# Patient Record
Sex: Female | Born: 1940 | Race: White | Hispanic: No | Smoking: Never smoker
Health system: Southern US, Community
[De-identification: ages and names within clinical notes are randomized; demographics above are authoritative.]

## PROBLEM LIST (undated history)

## (undated) DIAGNOSIS — E785 Hyperlipidemia, unspecified: Secondary | ICD-10-CM

## (undated) DIAGNOSIS — E7800 Pure hypercholesterolemia, unspecified: Secondary | ICD-10-CM

## (undated) DIAGNOSIS — M810 Age-related osteoporosis without current pathological fracture: Secondary | ICD-10-CM

## (undated) DIAGNOSIS — D649 Anemia, unspecified: Secondary | ICD-10-CM

## (undated) DIAGNOSIS — R809 Proteinuria, unspecified: Secondary | ICD-10-CM

## (undated) DIAGNOSIS — M1990 Unspecified osteoarthritis, unspecified site: Secondary | ICD-10-CM

## (undated) DIAGNOSIS — I10 Essential (primary) hypertension: Secondary | ICD-10-CM

## (undated) DIAGNOSIS — N200 Calculus of kidney: Secondary | ICD-10-CM

## (undated) DIAGNOSIS — K219 Gastro-esophageal reflux disease without esophagitis: Secondary | ICD-10-CM

## (undated) HISTORY — DX: Age-related osteoporosis without current pathological fracture: M81.0

## (undated) HISTORY — PX: EYE SURGERY: SHX253

## (undated) HISTORY — DX: Anemia, unspecified: D64.9

## (undated) HISTORY — PX: BACK SURGERY: SHX140

## (undated) HISTORY — PX: NASAL SEPTUM SURGERY: SHX37

## (undated) HISTORY — DX: Gastro-esophageal reflux disease without esophagitis: K21.9

## (undated) HISTORY — PX: CHOLECYSTECTOMY: SHX55

## (undated) HISTORY — DX: Calculus of kidney: N20.0

## (undated) HISTORY — DX: Unspecified osteoarthritis, unspecified site: M19.90

## (undated) HISTORY — PX: BREAST CYST ASPIRATION: SHX578

## (undated) HISTORY — DX: Hyperlipidemia, unspecified: E78.5

## (undated) HISTORY — PX: ABDOMINAL HYSTERECTOMY: SHX81

---

## 1999-03-01 ENCOUNTER — Other Ambulatory Visit: Admission: RE | Admit: 1999-03-01 | Discharge: 1999-03-01 | Payer: Self-pay | Admitting: *Deleted

## 2000-07-02 ENCOUNTER — Other Ambulatory Visit: Admission: RE | Admit: 2000-07-02 | Discharge: 2000-07-02 | Payer: Self-pay | Admitting: *Deleted

## 2004-04-25 ENCOUNTER — Emergency Department (HOSPITAL_COMMUNITY): Admission: EM | Admit: 2004-04-25 | Discharge: 2004-04-25 | Payer: Self-pay | Admitting: Emergency Medicine

## 2004-09-26 ENCOUNTER — Emergency Department (HOSPITAL_COMMUNITY): Admission: EM | Admit: 2004-09-26 | Discharge: 2004-09-26 | Payer: Self-pay | Admitting: Emergency Medicine

## 2004-11-30 ENCOUNTER — Ambulatory Visit (HOSPITAL_COMMUNITY): Admission: RE | Admit: 2004-11-30 | Discharge: 2004-11-30 | Payer: Self-pay | Admitting: Family Medicine

## 2005-02-15 ENCOUNTER — Ambulatory Visit (HOSPITAL_COMMUNITY): Admission: RE | Admit: 2005-02-15 | Discharge: 2005-02-15 | Payer: Self-pay | Admitting: Internal Medicine

## 2005-02-15 ENCOUNTER — Ambulatory Visit: Payer: Self-pay | Admitting: Internal Medicine

## 2006-05-21 ENCOUNTER — Ambulatory Visit: Payer: Self-pay | Admitting: Internal Medicine

## 2006-05-24 ENCOUNTER — Encounter: Admission: RE | Admit: 2006-05-24 | Discharge: 2006-05-24 | Payer: Self-pay | Admitting: Obstetrics and Gynecology

## 2006-06-04 ENCOUNTER — Ambulatory Visit: Payer: Self-pay | Admitting: Internal Medicine

## 2006-10-30 ENCOUNTER — Ambulatory Visit: Payer: Self-pay | Admitting: Internal Medicine

## 2006-10-30 LAB — CONVERTED CEMR LAB
CO2: 30 meq/L (ref 19–32)
Calcium: 9.2 mg/dL (ref 8.4–10.5)
Chloride: 107 meq/L (ref 96–112)
Glucose, Bld: 125 mg/dL — ABNORMAL HIGH (ref 70–99)
Hgb A1c MFr Bld: 8.6 % — ABNORMAL HIGH (ref 4.6–6.0)
Microalb Creat Ratio: 565.5 mg/g — ABNORMAL HIGH (ref 0.0–30.0)

## 2006-12-31 ENCOUNTER — Ambulatory Visit: Payer: Self-pay | Admitting: Internal Medicine

## 2007-01-17 DIAGNOSIS — I10 Essential (primary) hypertension: Secondary | ICD-10-CM | POA: Insufficient documentation

## 2007-01-17 DIAGNOSIS — M949 Disorder of cartilage, unspecified: Secondary | ICD-10-CM

## 2007-01-17 DIAGNOSIS — Z9079 Acquired absence of other genital organ(s): Secondary | ICD-10-CM | POA: Insufficient documentation

## 2007-01-17 DIAGNOSIS — M899 Disorder of bone, unspecified: Secondary | ICD-10-CM | POA: Insufficient documentation

## 2007-01-17 DIAGNOSIS — E785 Hyperlipidemia, unspecified: Secondary | ICD-10-CM | POA: Insufficient documentation

## 2007-01-17 DIAGNOSIS — E119 Type 2 diabetes mellitus without complications: Secondary | ICD-10-CM | POA: Insufficient documentation

## 2007-02-27 ENCOUNTER — Encounter: Payer: Self-pay | Admitting: Internal Medicine

## 2007-02-27 ENCOUNTER — Ambulatory Visit: Payer: Self-pay

## 2007-10-03 ENCOUNTER — Encounter: Admission: RE | Admit: 2007-10-03 | Discharge: 2007-10-03 | Payer: Self-pay | Admitting: Family Medicine

## 2007-10-14 ENCOUNTER — Encounter: Admission: RE | Admit: 2007-10-14 | Discharge: 2007-10-14 | Payer: Self-pay | Admitting: Family Medicine

## 2007-11-11 ENCOUNTER — Telehealth (INDEPENDENT_AMBULATORY_CARE_PROVIDER_SITE_OTHER): Payer: Self-pay | Admitting: *Deleted

## 2007-11-18 ENCOUNTER — Ambulatory Visit (HOSPITAL_COMMUNITY): Admission: RE | Admit: 2007-11-18 | Discharge: 2007-11-19 | Payer: Self-pay | Admitting: Neurosurgery

## 2010-02-24 ENCOUNTER — Encounter (INDEPENDENT_AMBULATORY_CARE_PROVIDER_SITE_OTHER): Payer: Self-pay

## 2010-09-25 ENCOUNTER — Encounter: Payer: Self-pay | Admitting: Obstetrics and Gynecology

## 2010-10-06 NOTE — Letter (Signed)
Summary: Recall Colonoscopy/Endoscopy, Change to Office Visit  [facility]  [address]   [place], [place] [address]   Phone: [phone]  Fax: [phone]      February 24, 2010   Catherine Burke [address] [place], [place]  [address] [birth year]   Dear [name],   According to our records, it is time for you to schedule a Colonoscopy/Endoscopy. However, after reviewing your medical record, we recommend an office visit in order to determine your need for a repeat procedure.  Please call [phone] at your convenience to schedule an office visit. If you have any questions or concerns, please feel free to contact our office.   Sincerely,   Waldon Merl LPN  [facility] Ph: [phone]   Fax: [phone]

## 2011-01-17 NOTE — Op Note (Signed)
NAMESHARMONIQUE, MILMAN NO.:  [id number]   MEDICAL RECORD NO.:  [id number]          PATIENT TYPE:  INP   LOCATION:  [place]                         FACILITY:  [facility] [place]   PHYSICIAN:  Ophelia Charter, M.D.DATE OF BIRTH:  1941-06-20   DATE OF PROCEDURE:  DATE OF DISCHARGE:                               OPERATIVE REPORT   BRIEF HISTORY:  The patient is a 70 year old white female who has  suffered from back and left leg pain consistent with a left L3  radiculopathy.  She failed medical management, was worked up with a  lumbar MRI which demonstrated a far lateral herniated disk at L3-4 on  the left.  The patient's signs, symptoms, and physical exam were  consistent with left L3 radiculopathy.  I discussed the various  treatment options with her including surgery.  She has weighed the  risks, benefits, alternatives of the surgery and decided to proceed with  the operation.   Of note on the morning of surgery, the patient's potassium was found to  be 2.6.  She was given multiple runs of potassium prior to surgery.   PREOPERATIVE DIAGNOSES:  Left L3-4 far lateral herniated nucleus  pulposus, spinal stenosis, lumbar radiculopathy/myelopathy, lumbago.   POSTOPERATIVE DIAGNOSES:  Left L3-4 far lateral herniated nucleus  pulposus, spinal stenosis, lumbar radiculopathy/myelopathy, lumbago.   PROCEDURE:  Left L3-4 far lateral microdiskectomy using microdissection.   SURGEON:  Ophelia Charter, M.D.   ASSISTANT:  Leeroy Cha, M.D.   ANESTHESIA:  General endotracheal.   ESTIMATED BLOOD LOSS:  25 cc.   SPECIMENS:  None.   DRAINS:  None.   COMPLICATIONS:  None.   DESCRIPTION OF PROCEDURE:  The patient was brought to the operating by  the anesthesia team.  General endotracheal anesthesia was induced.  The  patient was turned to the prone position on the Wilson frame.  Lumbosacral region was then prepared with Betadine scrub and Betadine  solution.  Sterile drapes  were applied.  I then injected the area to be  incised with Marcaine with epinephrine solution.  I used a scalpel to  make  a linear incision, midline incision over the L3-4 interspace.  I  used electrocautery to perform a left-sided subperiosteal dissection  exposing spinous process lamina of L3-L4.  We obtained intraoperative  radiograph to confirm our location.  We then inserted the [facility]  retractor for exposure.   We then brought the operative microscope into the field and under its  magnification, illumination, we completed the  microdissection/decompression.  We used electrocautery to expose the  left L3 pars region.  Then, we  used a high-speed drill to drill  off  the lateral aspect of left L3 pars region exposing underlying  intertransverse ligament.  We then removed this ligament with the  Kerrison punches and then used microdissection to identify and expose  the underlying exiting left L3 nerve root.  We then dissected around the  nerve root and noted just cephalad and lateral to the nerve root was a  large free fragment disk herniation.  We removed the multiple fragments  using  the micropituitary forceps.  We then palpated along the ventral  surface of the nerve root and at this point, it was well decompressed.  We inspected the intervertebral disk.  We did not see any impending  herniations, and we did not enter into the intervertebral disk space.  We then obtained hemostasis using bipolar electrocautery.  We irrigated  the wound out with bacitracin solution, then removed the retractor.  We  then reapproximated the patient's thoracolumbar fascia with interrupted  #1 Vicryl suture and subcutaneous tissue with interrupted 2-0 Vicryl  suture and the skin with Steri-Strips and Benzoin.  The wound was then  coated bacitracin ointment.  Sterile dressings applied.  The drapes were  removed.  The patient was subsequently returned to supine position where  she was extubated by  the anesthesia team and transported to  postanesthesia care unit in stable condition.  All sponge, instrument,  and needle counts were correct in this case.      Ophelia Charter, M.D.  Electronically Signed     JDJ/MEDQ  D:  11/18/2007  T:  11/19/2007  Job:  [id number]

## 2011-01-20 NOTE — Assessment & Plan Note (Signed)
[facility] OFFICE NOTE   Catherine, Burke                       MRN:          [id number]  DATE:05/21/2006                            DOB:          May 17, 1941    CHIEF COMPLAINT:  Here to establish.   HISTORY OF THE PRESENT ILLNESS:  [name] is a 70 year old white female  who came to the office for the first time to get established.  She has been  taking care of her mother, who recently passed.  Because of that, she had  very little time to take care of herself and consequently is now here for  evaluation of her major medical problems.   PAST MEDICAL HISTORY:  1. Diabetes diagnosed in 2004.  2. Hypertension diagnosed in the 1980s.  3. Osteoarthritis of the knees.  4. Osteopenia.  5. Status post partial hysterectomy, cholecystectomy and surgery for      deviated septum.  6. Bone density test this year per her OB/GYN.  7. Status post 2 colonoscopies; the last one was last year; this was done      at [facility] at Dr. Laural Golden.  The colonoscopy in 2006 was basically      negative and she was told to repeat it around 2012.   FAMILY HISTORY:  1. Father had an MI at age 75.  2. Diabetes runs in her family; several aunts have it.  3. There are people in her family with high blood pressure.  4. Two cousins have colon cancer.  5. An aunt had breast cancer.  6. One of her grandfathers had pancreatic cancer.  17. Mom has a history of blood clots.   SOCIAL HISTORY:  She does not smoke or drink.  She has 3 children.  She is  divorced; since then, her husband has passed.   REVIEW OF SYSTEMS:  GENERAL:  She is feeling okay.  She has not had a blood  check in 6 months.  Her ambulatory blood pressure is variable from 170/90 to  less than that.  Her sugars are consistently around 200 and hardly ever she  sees a number less than that.   MEDICATIONS:  1. Diovan HCT 160/12.5 mg one p.o. daily.  2. Lotrel  10/20 mg one p.o. daily.  3. Toprol-XL 50 mg one p.o. daily.  4. Januvia 100 one p.o. daily, which she started 6 months ago.  5. Fortamet ER 1000 mg one p.o. b.i.d.  6. Evita 60 mg one p.o. daily.  7. Zetia 10 mg one p.o.  8. Aspirin 81 mg once p.o. daily.  9. Vitamin C.  10.Fish oil.   ALLERGIES:  1. PENICILLIN.  2. She cannot tolerate CLONIDINE.   PHYSICAL EXAM:  GENERAL:  The patient is alert, oriented, in no apparent  distress.  VITAL SIGNS:  She is 5-feet 4-inches tall.  She weighs 156 pounds.  Blood  pressure 140/80, pulse 60.  NECK:  No thyromegaly.  LUNGS:  Clear to auscultation bilaterally.  CARDIOVASCULAR:  Regular rate and rhythm without a murmur.  ABDOMEN:  Not distended, soft.  Good  bowel sounds.  No organomegaly.  EXTREMITIES:  No edema.   ASSESSMENT AND PLAN:  1. Diabetes does not seem to be well-controlled at present.  We will check      an A1c and a micro-albumin and further advice will be with results.  2. Hypertension:  This is probably not well-controlled.  Blood pressure      today is only minimally elevated.  We will reassess when she comes      back.  3. High cholesterol:  We will check a fasting lipid profile and monitor      liver function tests.  We will also check a BMP due to the history of      hypertension.  4. We will get old records from the previous doctor.  5. Office visit in 2 weeks.                                   Kathlene November, MD   JP/MedQ  DD:  05/21/2006  DT:  05/22/2006  Job #:  [id number]

## 2011-01-20 NOTE — Op Note (Signed)
Catherine Burke, Catherine Burke                ACCOUNT NO.:  [id number]   MEDICAL RECORD NO.:  [id number]          PATIENT TYPE:  AMB   LOCATION:  DAY                           FACILITY:  [facility]   PHYSICIAN:  Hildred Laser, M.D.    DATE OF BIRTH:  1941-04-09   DATE OF PROCEDURE:  02/15/2005  DATE OF DISCHARGE:                                 OPERATIVE REPORT   PROCEDURE:  Colonoscopy.   ENDOSCOPIST:  Hildred Laser, M.D.   INDICATIONS:  Catherine Burke is a 70 year old Caucasian female who is here for  screening colonoscopy. Family history is positive for colon carcinoma in  second-degree relatives. She had two of her first cousins who died of colon  carcinoma in their 59s. The procedure was reviewed with the patient, and  informed consent was obtained.   PREMEDICATION:  Demerol 50 mg IV, Versed 6 mg IV in divided doses.   FINDINGS:  The procedure was performed in the endoscopy suite. The patient's  vital signs and O2 saturations were monitored during the procedure and  remained stable. The patient was placed in the left lateral position. Rectal  examination was performed. She had soft skin tags. Digital exam was normal.  The Olympus videoscope was placed in the rectum and advanced under vision  into the sigmoid colon and beyond. Redundant sigmoid colon.  Slowly and  carefully scope was advanced without forming the loop. Preparation was  satisfactory. The scope was advanced to the cecum which was identified by  appendiceal orifice and ileocecal valve. Pictures were taken for the record.  As the scope was withdrawn, the colonic mucosa was examined for the second  time and was normal throughout. Rectal mucosa similarly was normal. The  scope was retroflexed to examine the anorectal junction, and small  hemorrhoids were noted below the dentate line. The endoscope was  straightened and withdrawn. The patient tolerated the procedure well.   FINAL DIAGNOSIS:  Redundant but normal colonoscopy, normal colon exam  except  small external hemorrhoids.   RECOMMENDATIONS:  She will resume her usual medications . She should  continue yearly Hemoccults. She may consider next screening exam either in 5  or 10 years, whichever she is comfortable with.       NR/MEDQ  D:  02/15/2005  T:  02/15/2005  Job:  [id number]   cc:   Lorriane Shire, MD

## 2011-05-29 LAB — BASIC METABOLIC PANEL
BUN: 13
BUN: 14
CO2: 27
CO2: 28
Chloride: 102
Chloride: 103
Chloride: 104
Creatinine, Ser: 0.72
Creatinine, Ser: 0.94
GFR calc Af Amer: >60
GFR calc Af Amer: >60
GFR calc non Af Amer: 59 — ABNORMAL LOW
Glucose, Bld: 185 — ABNORMAL HIGH
Potassium: 3.3 — ABNORMAL LOW
Potassium: 2.6 — CL
Potassium: 3.8
Sodium: 144

## 2011-05-29 LAB — CBC
HCT: 38.5
Hemoglobin: 13.1
WBC: 9.5

## 2011-06-01 LAB — HM COLONOSCOPY

## 2011-11-22 ENCOUNTER — Emergency Department (HOSPITAL_COMMUNITY)
Admission: EM | Admit: 2011-11-22 | Discharge: 2011-11-22 | Disposition: A | Discharge status: Home / Self Care | Payer: Medicare Other | Attending: Emergency Medicine | Admitting: Emergency Medicine

## 2011-11-22 ENCOUNTER — Encounter (HOSPITAL_COMMUNITY): Payer: Self-pay

## 2011-11-22 DIAGNOSIS — R1915 Other abnormal bowel sounds: Secondary | ICD-10-CM | POA: Insufficient documentation

## 2011-11-22 DIAGNOSIS — R197 Diarrhea, unspecified: Secondary | ICD-10-CM | POA: Insufficient documentation

## 2011-11-22 DIAGNOSIS — E7800 Pure hypercholesterolemia, unspecified: Secondary | ICD-10-CM | POA: Insufficient documentation

## 2011-11-22 DIAGNOSIS — E119 Type 2 diabetes mellitus without complications: Secondary | ICD-10-CM | POA: Insufficient documentation

## 2011-11-22 DIAGNOSIS — R51 Headache: Secondary | ICD-10-CM | POA: Insufficient documentation

## 2011-11-22 DIAGNOSIS — I10 Essential (primary) hypertension: Secondary | ICD-10-CM | POA: Insufficient documentation

## 2011-11-22 DIAGNOSIS — R109 Unspecified abdominal pain: Secondary | ICD-10-CM | POA: Insufficient documentation

## 2011-11-22 DIAGNOSIS — R112 Nausea with vomiting, unspecified: Secondary | ICD-10-CM | POA: Insufficient documentation

## 2011-11-22 HISTORY — DX: Pure hypercholesterolemia, unspecified: E78.00

## 2011-11-22 HISTORY — DX: Essential (primary) hypertension: I10

## 2011-11-22 LAB — URINALYSIS, ROUTINE W REFLEX MICROSCOPIC
Glucose, UA: >1000 mg/dL — AB
Protein, ur: 30 mg/dL — AB
pH: 6 (ref 5.0–8.0)

## 2011-11-22 LAB — CBC
HCT: 41.6 % (ref 36.0–46.0)
Hemoglobin: 14 g/dL (ref 12.0–15.0)
MCH: 27.4 pg (ref 26.0–34.0)
MCV: 81.4 fL (ref 78.0–100.0)
RBC: 5.11 MIL/uL (ref 3.87–5.11)
WBC: 15 10*3/uL — ABNORMAL HIGH (ref 4.0–10.5)

## 2011-11-22 LAB — BASIC METABOLIC PANEL
CO2: 26 mEq/L (ref 19–32)
Calcium: 9.1 mg/dL (ref 8.4–10.5)
Chloride: 100 mEq/L (ref 96–112)
Glucose, Bld: 379 mg/dL — ABNORMAL HIGH (ref 70–99)
Potassium: 3.4 mEq/L — ABNORMAL LOW (ref 3.5–5.1)
Sodium: 140 mEq/L (ref 135–145)

## 2011-11-22 MED ORDER — SODIUM CHLORIDE 0.9 % IV BOLUS (SEPSIS)
1000.0000 mL | Freq: Once | INTRAVENOUS | Status: AC
  Administered 2011-11-22: 1000 mL via INTRAVENOUS

## 2011-11-22 MED ORDER — SODIUM CHLORIDE 0.9 % IV SOLN
Freq: Once | INTRAVENOUS | Status: AC
  Administered 2011-11-22: 08:00:00 via INTRAVENOUS

## 2011-11-22 MED ORDER — ONDANSETRON HCL 4 MG/2ML IJ SOLN
4.0000 mg | Freq: Once | INTRAMUSCULAR | Status: AC
  Administered 2011-11-22: 4 mg via INTRAVENOUS
  Filled 2011-11-22: qty 2

## 2011-11-22 MED ORDER — MORPHINE SULFATE 2 MG/ML IJ SOLN
2.0000 mg | Freq: Once | INTRAMUSCULAR | Status: AC
  Administered 2011-11-22: 2 mg via INTRAVENOUS
  Filled 2011-11-22: qty 1

## 2011-11-22 MED ORDER — ONDANSETRON HCL 4 MG PO TABS: 4.0000 mg | ORAL_TABLET | Freq: Four times a day (QID) | ORAL | Status: AC

## 2011-11-22 MED ORDER — DIPHENOXYLATE-ATROPINE 2.5-0.025 MG PO TABS
2.0000 | ORAL_TABLET | Freq: Once | ORAL | Status: AC
  Administered 2011-11-22: 2 via ORAL
  Filled 2011-11-22: qty 2

## 2011-11-22 NOTE — Discharge Instructions (Signed)
Drink lots of fluids.  Bland diet for the next 6-8 hours then progress as you can tolerate. Use the nausea medicine as needed. Use BRAT for diarrhea. Follow up with your doctor.   B.R.A.T. Diet Your doctor has recommended the B.R.A.T. diet for you or your child until the condition improves. This is often used to help control diarrhea and vomiting symptoms. If you or your child can tolerate clear liquids, you may have:  Bananas.   Rice.   Applesauce.   Toast (and other simple starches such as crackers, potatoes, noodles).  Be sure to avoid dairy products, meats, and fatty foods until symptoms are better. Fruit juices such as apple, grape, and prune juice can make diarrhea worse. Avoid these. Continue this diet for 2 days or as instructed by your caregiver. Document Released: 08/21/2005 Document Revised: 08/10/2011 Document Reviewed: 02/07/2007 Exit[employer] Patient Information 2012 [employer]. 

## 2011-11-22 NOTE — ED Provider Notes (Addendum)
History   This chart was scribed for [employer]. Olin Hauser, MD by Reece Agar. The patient was seen in room APA17/APA17. Patient's care was started at 0733.   CSN: [id number]  Arrival date & time 11/22/11  [id number]   First MD Initiated Contact with Patient 11/22/11 [phone]      Chief Complaint  Patient presents with  . Emesis    (Consider location/radiation/quality/duration/timing/severity/associated sxs/prior treatment) HPI Catherine Burke is a 71 y.o. female who presents to the Emergency Department complaining of constant nausea, vomiting, and diarrhea onset last night with associated abdominal pain described as cramping and HA. Patients diarrhea began 3 hours ago and that she last urinated 1.5 hours ago. Denies recent sick contacts, chest pain, SOB, fever, back pain. H/o HTN and diabetes.   Past Medical History  Diagnosis Date  . Diabetes mellitus   . Hypertension   . Hypercholesterolemia     Past Surgical History  Procedure Date  . Abdominal hysterectomy   . Back surgery   . Cholecystectomy     No family history on file.  History  Substance Use Topics  . Smoking status: Never Smoker   . Smokeless tobacco: Not on file  . Alcohol Use: No    OB History    Grav Para Term Preterm Abortions TAB SAB Ect Mult Living                  Review of Systems 10 Systems reviewed and are negative for acute change except as noted in the HPI.  Allergies  Penicillins  Home Medications  No current outpatient prescriptions on file.  BP 122/72  Pulse 96  Temp(Src) 98.7 F (37.1 C) (Oral)  Resp 14  Ht 5\' 4"  (1.626 m)  Wt 170 lb (77.111 kg)  BMI 29.18 kg/m2  SpO2 97%  Physical Exam  Nursing note and vitals reviewed. Constitutional: She is oriented to person, place, and time. She appears well-developed and well-nourished. No distress.  HENT:  Head: Normocephalic and atraumatic.  Eyes: EOM are normal. Pupils are equal, round, and reactive to light.  Neck: Neck supple. No tracheal  deviation present.  Cardiovascular: Normal rate, regular rhythm and normal heart sounds.   Pulmonary/Chest: Effort normal. No respiratory distress. She has no wheezes.  Abdominal: Soft. She exhibits no distension. There is no tenderness.       Hyperactive bowel sounds  Musculoskeletal: Normal range of motion. She exhibits no edema.  Neurological: She is alert and oriented to person, place, and time. No sensory deficit.  Skin: Skin is warm and dry.  Psychiatric: She has a normal mood and affect. Her behavior is normal.    ED Course  Procedures (including critical care time) DIAGNOSTIC STUDIES: Oxygen Saturation is 97% on room air, normal by my interpretation.    COORDINATION OF CARE: 8:00AM-Patient informed of current plan for treatment and evaluation and agrees with plan at this time.   Results for orders placed during the hospital encounter of 11/22/11  URINALYSIS, ROUTINE W REFLEX MICROSCOPIC      Component Value Range   Color, Urine YELLOW  YELLOW    APPearance CLEAR  CLEAR    Specific Gravity, Urine 1.010  1.005 - 1.030    pH 6.0  5.0 - 8.0    Glucose, UA >1000 (*) NEGATIVE (mg/dL)   Hgb urine dipstick SMALL (*) NEGATIVE    Bilirubin Urine NEGATIVE  NEGATIVE    Ketones, ur 15 (*) NEGATIVE (mg/dL)   Protein, ur 30 (*) NEGATIVE (  mg/dL)   Urobilinogen, UA 0.2  0.0 - 1.0 (mg/dL)   Nitrite NEGATIVE  NEGATIVE    Leukocytes, UA NEGATIVE  NEGATIVE   BASIC METABOLIC PANEL      Component Value Range   Sodium 140  135 - 145 (mEq/L)   Potassium 3.4 (*) 3.5 - 5.1 (mEq/L)   Chloride 100  96 - 112 (mEq/L)   CO2 26  19 - 32 (mEq/L)   Glucose, Bld 379 (*) 70 - 99 (mg/dL)   BUN 16  6 - 23 (mg/dL)   Creatinine, Ser 0.81  0.50 - 1.10 (mg/dL)   Calcium 9.1  8.4 - 10.5 (mg/dL)   GFR calc non Af Amer 71 (*) >90 (mL/min)   GFR calc Af Amer 82 (*) >90 (mL/min)  CBC      Component Value Range   WBC 15.0 (*) 4.0 - 10.5 (K/uL)   RBC 5.11  3.87 - 5.11 (MIL/uL)   Hemoglobin 14.0  12.0 - 15.0  (g/dL)   HCT 41.6  36.0 - 46.0 (%)   MCV 81.4  78.0 - 100.0 (fL)   MCH 27.4  26.0 - 34.0 (pg)   MCHC 33.7  30.0 - 36.0 (g/dL)   RDW 14.2  11.5 - 15.5 (%)   Platelets 237  150 - 400 (K/uL)  URINE MICROSCOPIC-ADD ON      Component Value Range   Squamous Epithelial / LPF RARE  RARE    RBC / HPF 0-2  <3 (RBC/hpf)    MDM  Patient with nausea, vomiting, diarrhea, headache, that began this morning. Given IVF, antiemetic, analgesic with improvement. Able to take PO fluids. No further vomiting or diarrhea.  Pt feels improved after observation and/or treatment in ED.Pt stable in ED with no significant deterioration in condition.The patient appears reasonably screened and/or stabilized for discharge and I doubt any other medical condition or other [facility] requiring further screening, evaluation, or treatment in the ED at this time prior to discharge.  I personally performed the services described in this documentation, which was scribed in my presence. The recorded information has been reviewed and considered.   MDM Reviewed: nursing note and vitals  labs reviewed         Gypsy Balsam. Olin Hauser, MD 11/22/11 [place] Olin Hauser, MD 11/23/11 [phone]

## 2011-11-22 NOTE — ED Notes (Signed)
Pt reports n/v/d and abd pain since midnight.  Also reports headache.

## 2012-08-19 ENCOUNTER — Telehealth (HOSPITAL_COMMUNITY): Payer: Self-pay | Admitting: Dietician

## 2012-08-19 NOTE — Telephone Encounter (Signed)
Left message with referral coordinator, Maudie Mercury, to follow-up on appointment date, per her request.

## 2012-08-19 NOTE — Telephone Encounter (Signed)
Appointment scheduled for 09/18/11 at 2:00 PM. Pt requested appointment be scheduled after the holidays.

## 2012-08-19 NOTE — Telephone Encounter (Signed)
Received referral via fax from [facility]) for dx: diabetes.

## 2012-09-09 ENCOUNTER — Telehealth (HOSPITAL_COMMUNITY): Payer: Self-pay | Admitting: Dietician

## 2012-09-09 NOTE — Telephone Encounter (Signed)
Mailed appointment confirmation letter and instructions for appointment scheduled 09/17/12 at 2:00 PM via [place] [employer].

## 2012-09-17 ENCOUNTER — Telehealth (HOSPITAL_COMMUNITY): Payer: Self-pay | Admitting: Dietician

## 2012-09-17 NOTE — Telephone Encounter (Signed)
Received voicemail left today at 0910. Pt reports she is unable to attend appointment today at 1400 due to sickness. Called pt back at 0933. Appointment rescheduled for 10/01/12 at 0900.

## 2012-09-27 ENCOUNTER — Telehealth (HOSPITAL_COMMUNITY): Payer: Self-pay | Admitting: Dietician

## 2012-09-27 NOTE — Telephone Encounter (Signed)
Mailed appointment confirmation letter and instructions for appointment scheduled 10/02/11 at 9 AM via [place] [employer].

## 2012-10-01 ENCOUNTER — Encounter (HOSPITAL_COMMUNITY): Payer: Self-pay | Admitting: Dietician

## 2012-10-01 DIAGNOSIS — M810 Age-related osteoporosis without current pathological fracture: Secondary | ICD-10-CM | POA: Insufficient documentation

## 2012-10-01 DIAGNOSIS — N200 Calculus of kidney: Secondary | ICD-10-CM | POA: Insufficient documentation

## 2012-10-01 DIAGNOSIS — E785 Hyperlipidemia, unspecified: Secondary | ICD-10-CM | POA: Insufficient documentation

## 2012-10-01 DIAGNOSIS — K219 Gastro-esophageal reflux disease without esophagitis: Secondary | ICD-10-CM | POA: Insufficient documentation

## 2012-10-01 NOTE — Progress Notes (Signed)
Outpatient Initial Nutrition Assessment  Date:10/01/2012   Appt Start Time: 0857  Referring Physician: Dr. Dennard Schaumann [facility]) Reason for Visit: diabetes  Nutrition Assessment:  Height: 5\' 2"  (157.5 cm)   Weight: 165 lb (74.844 kg)   IBW: 110# %IBW: 150% UBW: 165# %UBW: 100% Body mass index is 30.18 kg/(m^2). Classified as obesity, class I.  Goal Weight: 148# Weight hx: Pt reports highest weight of 170#. She reports UBW of 150# that she last weight 3 years ago. She maintained her weight of 165# for the past year. She reports that she is a [employer] copy. Her lowest adult weight was 139#.   Estimated nutritional needs: 1200-1300 kcals daily, 60-75 grams protein daily, 1.2-1.3 L fluid daily  PMH:  Past Medical History  Diagnosis Date  . Diabetes mellitus   . Hypertension   . Hypercholesterolemia   . Kidney stones   . Osteoporosis   . HLD (hyperlipidemia)   . GERD (gastroesophageal reflux disease)     Medications:  Current Outpatient Rx  Name  Route  Sig  Dispense  Refill  . ALENDRONATE SODIUM 70 MG PO TABS   Oral   Take 70 mg by mouth every 30 (thirty) days. Take with a full glass of water on an empty stomach.         Marland Kitchen CALCIUM-VITAMIN A-VITAMIN D PO   Oral   Take by mouth 2 (two) times daily.         Marland Kitchen AMLODIPINE BESY-BENAZEPRIL HCL 10-20 MG PO CAPS   Oral   Take 1 capsule by mouth daily.         . ATORVASTATIN CALCIUM 40 MG PO TABS   Oral   Take 40 mg by mouth every morning.         Marland Kitchen HYDROCHLOROTHIAZIDE 25 MG PO TABS   Oral   Take 25 mg by mouth daily.         . INSULIN GLARGINE 100 UNIT/ML [place] SOLN   Subcutaneous   Inject 9 Units into the skin at bedtime.         Marland Kitchen METFORMIN HCL 500 MG PO TABS   Oral   Take 500 mg by mouth 2 (two) times daily with a meal.         . METOPROLOL TARTRATE 50 MG PO TABS   Oral   Take 50 mg by mouth 2 (two) times daily.         Marland Kitchen OMEPRAZOLE 40 MG PO CPDR   Oral   Take 40 mg  by mouth daily.         Marland Kitchen SITAGLIPTIN PHOSPHATE 100 MG PO TABS   Oral   Take 100 mg by mouth daily.         . TELMISARTAN 80 MG PO TABS   Oral   Take 80 mg by mouth daily.           Labs: CMP     Component Value Date/Time   NA 140 11/22/2011 0755   K 3.4* 11/22/2011 0755   CL 100 11/22/2011 0755   CO2 26 11/22/2011 0755   GLUCOSE 379* 11/22/2011 0755   BUN 16 11/22/2011 0755   CREATININE 0.81 11/22/2011 0755   CALCIUM 9.1 11/22/2011 0755   GFRNONAA 71* 11/22/2011 0755   GFRAA 82* 11/22/2011 0755    Lipid Panel  No results found for this basename: chol, trig, hdl, cholhdl, vldl, ldlcalc     Lab Results  Component Value Date  HGBA1C 8.6* 10/30/2006   Lab Results  Component Value Date   MICROALBUR 170.5* 10/30/2006   CREATININE 0.81 11/22/2011     Lifestyle/ social habits: Ms. Joanis lives in [place] with her husband of 23 years. 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 She is very active, watching her 67 year old great granddaughter and her 84 year old granddaughter at least 3 times per week. 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 She reports no stress and is a very patient person, although she reveals she "gets aggravated" some days. She does not exercise out of her daily activities, due to 2 "bad knees".   Nutrition hx/habits: Ms. Blumenshine reports that due to her schedule, she chooses more convenience foods and eats out more than she has in the past. She reports that she has had diabetes for the past 5-8 years and maintained excellent Hgb A1c readings (less than 6, per her report) up until this past year. Noted last 2 Hgb A1cs of 9.1 and 8.4. She reports that "sweets, bread, and anything white" are her weaknesses. She also reports to snacking at night. Another barrier to her self management is that her insurance has recently changed and she does not have adequate funds for test strips, hence, she has not been checking her  blood sugars. She is concerned about her diabetes is not optimally controlled and reports side effects such as her eyesight becoming more poor and tingling in her feet. She is interested in learning about carbohydrate counting to better control her diabetes. She reports frustration with her diet, as her husband often buys sweets and candy when grocery shopping, and she ends up eating it.  Diet recall: 4 AM: coffee with non fat creamer and artifical sweetener; Breakfast (10 AM): [employer] biscuit OR oatmeal OR eggs and toast, coffee with non fat creamer and artificial sweetener; Lunch (2:00 PM): sandwich and soup OR small pizza OR can of pork and beans, diet drink or 2% milk; Dinner (6:00 PM): vegetables OR sandwich OR soup OR cereal  Nutrition Diagnosis: Excessive carbohydrate intake r/t excessive snacking, diet recall demonstrates carbohydrate intake of >60 grams carbs per meal AEB Hgb A1c: 8.4.   Nutrition Intervention: Nutrition rx: 1200 kcal NAS, diabetic diet; 3 meals per day (45-60 carbs per meal); meals 4-5 hours apart; low calorie beverages only; refrain from late night snacking; physical activity as tolerated  Education/Counseling Provided: Educated pt on principles of diabetic diet. Discussed carbohydrate metabolism in relation to diabetes. Educated pt on basic self-management principles including: signs and symptoms of hyperglycemia and hypoglycemia, goals for fasting and postprandial blood sugars, goals for Hgb A1c, importance of checking feet, importance of keeping PCP appointments, and foot care. Educated pt on plate method, portion sizes, and sources of carbohydrate. Also educated on carbohydrate counting. Discussed importance of regular meal pattern. Discussed importance of adding sources of whole grains to diet to improve glycemic control. Also encouraged to choose low fat dairy, lean meats, and whole fruits and vegetables more often. Discussed options of artificial sweeteners and  encouraged pt to use which brand she liked best. Discussed nutritional content of foods commonly eaten and discussed healthier alternatives. Discussed importance of compliance to prevent further complications of disease. Educated pt on importance of physical activity (goal of at least 30 minutes 5 times per week) along with a healthy diet to achieve weight loss and glycemic goals. Encouraged slow, moderate weight loss of 1-2# per week, or 7-10%  of current body weight. Provided plate method handouts. Used TeachBack to assess understanding. Provided plate method handout and "Carb Counting and Meal Planning" and "10 Simple Way to Control Diabetes" handouts.   Understanding, Motivation, Ability to Follow Recommendations: Expect fair to good compliance.   Monitoring and Evaluation: Goals: 1) 1-2# weight loss per week; 2) Physical activity as tolerated; 3) Fasting blood sugar: 80-120. Postprandial blood sugar: <180; 3) Hgb A1c < 7  Recommendations: 1) Use measuring cups to ensure proper portion control; 2) Use crock pot; 3) Prepare meals on days you are less busy; 4) Keep favorite foods (ex. Beans) in small, pre portioned containers; 5) Refrain from second helpings if possible, or choose second helpings of non starchy vegetables  F/U: PRN. Provided RD contact information.   Joaquim Lai, RD, LDN 10/01/2012  Appt EndTime: [id number]

## 2012-11-26 ENCOUNTER — Telehealth: Payer: Self-pay | Admitting: Physician Assistant

## 2012-11-26 DIAGNOSIS — I10 Essential (primary) hypertension: Secondary | ICD-10-CM

## 2012-11-26 MED ORDER — METOPROLOL TARTRATE 50 MG PO TABS: 50.0000 mg | ORAL_TABLET | Freq: Two times a day (BID) | ORAL | Status: DC

## 2012-11-26 NOTE — Telephone Encounter (Signed)
Medication refilled per protocol. 

## 2013-01-07 ENCOUNTER — Telehealth: Payer: Self-pay | Admitting: Family Medicine

## 2013-01-08 MED ORDER — HYDROCHLOROTHIAZIDE 25 MG PO TABS: 25.0000 mg | ORAL_TABLET | Freq: Every day | ORAL | Status: DC

## 2013-01-08 MED ORDER — AMLODIPINE BESY-BENAZEPRIL HCL 10-20 MG PO CAPS: 1.0000 | ORAL_CAPSULE | Freq: Every day | ORAL | Status: DC

## 2013-01-08 MED ORDER — TELMISARTAN 80 MG PO TABS: 80.0000 mg | ORAL_TABLET | Freq: Every day | ORAL | Status: DC

## 2013-01-08 NOTE — Telephone Encounter (Signed)
Rx Refilled  

## 2013-01-31 ENCOUNTER — Telehealth: Payer: Self-pay | Admitting: Family Medicine

## 2013-01-31 MED ORDER — METFORMIN HCL 500 MG PO TABS: 500.0000 mg | ORAL_TABLET | Freq: Two times a day (BID) | ORAL | Status: DC

## 2013-01-31 MED ORDER — METFORMIN HCL 1000 MG PO TABS: 1000.0000 mg | ORAL_TABLET | Freq: Two times a day (BID) | ORAL | Status: DC

## 2013-01-31 NOTE — Telephone Encounter (Signed)
Rx Refilled  

## 2013-03-06 ENCOUNTER — Other Ambulatory Visit: Payer: Self-pay | Admitting: Physician Assistant

## 2013-03-06 NOTE — Telephone Encounter (Signed)
Med refilled.

## 2013-04-17 ENCOUNTER — Other Ambulatory Visit: Payer: Self-pay | Admitting: Family Medicine

## 2013-04-17 ENCOUNTER — Encounter: Payer: Self-pay | Admitting: Family Medicine

## 2013-04-17 NOTE — Telephone Encounter (Signed)
Medication refilled per protocol. 

## 2013-04-28 ENCOUNTER — Other Ambulatory Visit: Payer: Self-pay | Admitting: Physician Assistant

## 2013-04-28 NOTE — Telephone Encounter (Signed)
Medication refill for one time only.  Patient needs to be seen.  Letter sent for patient to call and schedule 

## 2013-04-30 ENCOUNTER — Telehealth: Payer: Self-pay | Admitting: Family Medicine

## 2013-04-30 MED ORDER — METFORMIN HCL 500 MG PO TABS: ORAL_TABLET | ORAL | Status: DC

## 2013-04-30 NOTE — Telephone Encounter (Signed)
Rx Refilled  

## 2013-05-12 ENCOUNTER — Encounter: Payer: Self-pay | Admitting: Family Medicine

## 2013-05-12 ENCOUNTER — Ambulatory Visit (INDEPENDENT_AMBULATORY_CARE_PROVIDER_SITE_OTHER): Payer: 59 | Admitting: Family Medicine

## 2013-05-12 VITALS — BP 140/86 | HR 62 | Temp 98.3°F | Resp 16 | Wt 166.0 lb

## 2013-05-12 DIAGNOSIS — I10 Essential (primary) hypertension: Secondary | ICD-10-CM

## 2013-05-12 DIAGNOSIS — IMO0001 Reserved for inherently not codable concepts without codable children: Secondary | ICD-10-CM

## 2013-05-12 DIAGNOSIS — E785 Hyperlipidemia, unspecified: Secondary | ICD-10-CM

## 2013-05-12 DIAGNOSIS — Z23 Encounter for immunization: Secondary | ICD-10-CM

## 2013-05-12 NOTE — Progress Notes (Signed)
Subjective:    Patient ID: Catherine Burke, female    DOB: 1940-11-30, 72 y.o.   MRN: [id number]  HPI Patient is here for followup of her type 2 diabetes mellitus. She is currently on Lantus 32 units subcutaneous each bedtime. She is also taking metformin 500 mg by mouth twice a day.  Fasting blood sugars range between 100-200. She denies polyuria, polydipsia, or blurred vision. She denies any neuropathy in the feet. She does have hypertension. She is going on Lotrel 10/20, hydrochlorothiazide 25 mg by mouth daily, Cardis 80 mg by mouth daily, Lopressor 50 mg by mouth twice a day. She denies chest pain shortness of breath or dyspnea on exertion. She also has hyperlipidemia. She is currently taking Lipitor 40 mg by mouth daily. She denies myalgia right upper quadrant pain. Past Medical History  Diagnosis Date  . Diabetes mellitus   . Hypertension   . Hypercholesterolemia   . Kidney stones   . Osteoporosis   . HLD (hyperlipidemia)   . GERD (gastroesophageal reflux disease)    Past Surgical History  Procedure Laterality Date  . Abdominal hysterectomy    . Back surgery    . Cholecystectomy     Current Outpatient Prescriptions on File Prior to Visit  Medication Sig Dispense Refill  . amLODipine-benazepril (LOTREL) 10-20 MG per capsule TAKE ONE CAPSULES BY MOUTH DAILY.  30 capsule  1  . atorvastatin (LIPITOR) 40 MG tablet Take 40 mg by mouth every morning.      . hydrochlorothiazide (HYDRODIURIL) 25 MG tablet TAKE ONE TABLET BY MOUTH DAILY.  30 tablet  1  . metFORMIN (GLUCOPHAGE) 500 MG tablet TAKE ONE TABLET BY MOUTH 2 TIMES A DAY.  60 tablet  3  . metoprolol (LOPRESSOR) 50 MG tablet Take 1 tablet (50 mg total) by mouth 2 (two) times daily.  60 tablet  4  . telmisartan (MICARDIS) 80 MG tablet TAKE ONE TABLET BY MOUTH DAILY.  30 tablet  0   No current facility-administered medications on file prior to visit.   Allergies  Allergen Reactions  . Penicillins Anaphylaxis   History   Social  History  . Marital Status: Married    Spouse Name: N/A    Number of Children: N/A  . Years of Education: N/A   Occupational History  . Not on file.   Social History Main Topics  . Smoking status: Never Smoker   . Smokeless tobacco: Not on file  . Alcohol Use: No  . Drug Use: No  . Sexual Activity:    Other Topics Concern  . Not on file   Social History Narrative  . No narrative on file      Review of Systems  All other systems reviewed and are negative.       Objective:   Physical Exam  Constitutional: She appears well-developed and well-nourished.  Cardiovascular: Normal rate, regular rhythm, normal heart sounds and intact distal pulses.  Exam reveals no gallop and no friction rub.   No murmur heard. Pulmonary/Chest: Effort normal and breath sounds normal. No respiratory distress. She has no wheezes. She has no rales. She exhibits no tenderness.  Abdominal: Soft. Bowel sounds are normal. She exhibits no distension and no mass. There is no tenderness. There is no rebound and no guarding.          Assessment & Plan:  1. Need for prophylactic vaccination and inoculation against influenza - Flu Vaccine QUAD 36+ mos IM  2. HTN (hypertension) Blood pressure is acceptable.  Continue current blood pressure medications at the present dosages. - COMPLETE METABOLIC PANEL WITH GFR; Future - Lipid panel; Future  3. Type II or unspecified type diabetes mellitus without mention of complication, uncontrolled Increase Lantus one unit every day until fasting blood sugars are less than 130 on consistent basis.  Then focus on two-hour postprandial sugars. We will likely need to add Rapamune for meals to achieve postprandial sugars less than 160. Recheck in 2 week.  I recommended aspirin 81 mg by mouth daily. I also recommended she get an annual eye exam. - COMPLETE METABOLIC PANEL WITH GFR; Future - Lipid panel; Future - Hemoglobin A1c; Future - Microalbumin, urine; Future  4.  HLD (hyperlipidemia) Return fasting for fasting lipid panel. Goal LDL is less than 100. The patient also has a suspicious small her lumbar spine around the level of the L4. I recommend she return for a biopsy of the suspicious small to rule out melanoma - COMPLETE METABOLIC PANEL WITH GFR; Future - Lipid panel; Future

## 2013-05-15 ENCOUNTER — Ambulatory Visit: Payer: Medicare Other | Admitting: Family Medicine

## 2013-06-02 ENCOUNTER — Encounter: Payer: Self-pay | Admitting: Family Medicine

## 2013-06-02 ENCOUNTER — Other Ambulatory Visit: Payer: Self-pay | Admitting: Physician Assistant

## 2013-06-02 ENCOUNTER — Ambulatory Visit (INDEPENDENT_AMBULATORY_CARE_PROVIDER_SITE_OTHER): Payer: 59 | Admitting: Family Medicine

## 2013-06-02 VITALS — BP 170/80 | HR 60 | Temp 99.3°F | Resp 14 | Ht 62.0 in | Wt 168.0 lb

## 2013-06-02 DIAGNOSIS — D485 Neoplasm of uncertain behavior of skin: Secondary | ICD-10-CM

## 2013-06-02 NOTE — Telephone Encounter (Signed)
Refills for one month.  Letter sent to remind patient still needs to come have fasting lab work done.

## 2013-06-02 NOTE — Progress Notes (Signed)
  Subjective:    Patient ID: Catherine Burke, female    DOB: Apr 24, 1941, 72 y.o.   MRN: [id number]  HPI  Patient is here today for an excision of a suspicious mole seen during her physical. The mom was located on her central lower back around the level of L2.  It is 4 millimeters in size and black in appearance with irregular borders.  Given its location the patient does not know if it's changed recently. She is very apprehensive about today's procedure possibly explaining her elevated blood pressure. Past Medical History  Diagnosis Date  . Diabetes mellitus   . Hypertension   . Hypercholesterolemia   . Kidney stones   . Osteoporosis   . HLD (hyperlipidemia)   . GERD (gastroesophageal reflux disease)    Current Outpatient Prescriptions on File Prior to Visit  Medication Sig Dispense Refill  . atorvastatin (LIPITOR) 40 MG tablet Take 40 mg by mouth every morning.      . Insulin Glargine (LANTUS SOLOSTAR) 100 UNIT/ML SOPN Inject 32 Units into the skin at bedtime.      . metFORMIN (GLUCOPHAGE) 500 MG tablet TAKE ONE TABLET BY MOUTH 2 TIMES A DAY.  60 tablet  3  . metoprolol (LOPRESSOR) 50 MG tablet Take 1 tablet (50 mg total) by mouth 2 (two) times daily.  60 tablet  4   No current facility-administered medications on file prior to visit.   Allergies  Allergen Reactions  . Penicillins Anaphylaxis   History   Social History  . Marital Status: Married    Spouse Name: N/A    Number of Children: N/A  . Years of Education: N/A   Occupational History  . Not on file.   Social History Main Topics  . Smoking status: Never Smoker   . Smokeless tobacco: Not on file  . Alcohol Use: No  . Drug Use: No  . Sexual Activity:    Other Topics Concern  . Not on file   Social History Narrative  . No narrative on file     Review of Systems  All other systems reviewed and are negative.       Objective:   Physical Exam  Vitals reviewed. Cardiovascular: Normal rate and regular rhythm.     millimeter black suspicious mole at the level of L2 on her lower central back.        Assessment & Plan:  1. Neoplasm of uncertain behavior of skin After obtaining informed consent, the area was anesthetized with 0.1% lidocaine with epinephrine. A 4 mm punch biopsy was performed using sterile technique. The lesion was sent to pathology and labeled container. The wound was closed with one 3-0 Ethilon suture. Stitches out in one week.  Wound care is discussed. I would recheck her blood pressure in one week. - Pathology

## 2013-06-10 ENCOUNTER — Telehealth: Payer: Self-pay | Admitting: Family Medicine

## 2013-06-10 ENCOUNTER — Other Ambulatory Visit: Payer: 59

## 2013-06-10 DIAGNOSIS — I10 Essential (primary) hypertension: Secondary | ICD-10-CM

## 2013-06-10 DIAGNOSIS — IMO0001 Reserved for inherently not codable concepts without codable children: Secondary | ICD-10-CM

## 2013-06-10 DIAGNOSIS — E785 Hyperlipidemia, unspecified: Secondary | ICD-10-CM

## 2013-06-10 LAB — COMPLETE METABOLIC PANEL WITH GFR
Alkaline Phosphatase: 69 U/L (ref 39–117)
BUN: 17 mg/dL (ref 6–23)
GFR, Est Non African American: 72 mL/min
Glucose, Bld: 152 mg/dL — ABNORMAL HIGH (ref 70–99)
Sodium: 139 mEq/L (ref 135–145)
Total Bilirubin: 0.7 mg/dL (ref 0.3–1.2)

## 2013-06-10 LAB — LIPID PANEL
Cholesterol: 199 mg/dL (ref 0–200)
HDL: 49 mg/dL (ref >39)
Total CHOL/HDL Ratio: 4.1 Ratio

## 2013-06-10 MED ORDER — INSULIN GLARGINE 100 UNIT/ML SOLOSTAR PEN: 32.0000 [IU] | PEN_INJECTOR | Freq: Every day | SUBCUTANEOUS | Status: DC

## 2013-06-10 MED ORDER — INSULIN PEN NEEDLE 31G X 5 MM MISC: Status: DC

## 2013-06-10 NOTE — Telephone Encounter (Signed)
Sent to different pharmacy

## 2013-06-10 NOTE — Telephone Encounter (Signed)
She wants insulin and needles called in to [facility].  She is changing drugstores due to cost

## 2013-07-29 ENCOUNTER — Other Ambulatory Visit: Payer: Self-pay | Admitting: Physician Assistant

## 2013-07-29 ENCOUNTER — Encounter: Payer: Self-pay | Admitting: Family Medicine

## 2013-07-29 NOTE — Telephone Encounter (Signed)
Medication refill for one time only.  Patient needs to be seen.  Letter sent for patient to call and schedule 

## 2013-09-08 ENCOUNTER — Other Ambulatory Visit: Payer: Self-pay | Admitting: Physician Assistant

## 2013-09-11 ENCOUNTER — Telehealth: Payer: Self-pay | Admitting: Family Medicine

## 2013-09-11 NOTE — Telephone Encounter (Signed)
[phone] Pt is calling today to see what is going on with her insulin, she hasnt been able to get in and she hasnt had any in 2 days

## 2013-09-12 ENCOUNTER — Other Ambulatory Visit: Payer: Self-pay | Admitting: Family Medicine

## 2013-09-12 MED ORDER — INSULIN GLARGINE 100 UNIT/ML SOLOSTAR PEN: 30.0000 [IU] | PEN_INJECTOR | Freq: Every day | SUBCUTANEOUS | Status: DC

## 2013-09-12 NOTE — Telephone Encounter (Signed)
Tried to call, no answer and mail box is full.

## 2013-09-12 NOTE — Telephone Encounter (Signed)
Medication refilled per protocol. 

## 2013-11-05 ENCOUNTER — Ambulatory Visit: Payer: 59 | Admitting: Family Medicine

## 2013-11-10 ENCOUNTER — Encounter: Payer: Self-pay | Admitting: Family Medicine

## 2013-11-10 ENCOUNTER — Ambulatory Visit (INDEPENDENT_AMBULATORY_CARE_PROVIDER_SITE_OTHER): Payer: 59 | Admitting: Family Medicine

## 2013-11-10 VITALS — BP 140/70 | HR 60 | Temp 99.0°F | Resp 18 | Ht 62.0 in | Wt 172.0 lb

## 2013-11-10 DIAGNOSIS — I10 Essential (primary) hypertension: Secondary | ICD-10-CM

## 2013-11-10 DIAGNOSIS — E1165 Type 2 diabetes mellitus with hyperglycemia: Principal | ICD-10-CM

## 2013-11-10 DIAGNOSIS — E785 Hyperlipidemia, unspecified: Secondary | ICD-10-CM

## 2013-11-10 DIAGNOSIS — IMO0001 Reserved for inherently not codable concepts without codable children: Secondary | ICD-10-CM

## 2013-11-10 NOTE — Progress Notes (Signed)
Subjective:    Patient ID: Catherine Burke, female    DOB: Jan 29, 1941, 73 y.o.   MRN: [id number]  HPI Patient is a very pleasant 32 "female with history of insulin-dependent diabetes mellitus. She is currently on 45 units of Lantus subcutaneous daily. Her fasting blood sugars range 10/04/1968. She's also had a history of hyperlipidemia. She takes Lipitor 40 mg by mouth daily. She does complain of pain in her knee joints but denies any myalgias or right upper quadrant pain. She also has a history of difficult to control hypertension. She is on several medications for this. I reviewed her medicine list. She denies any chest pain shortness of breath or dyspnea on exertion. He does report occasional urinary urgency but otherwise is doing well. She is due for Prevnar 13. Past Medical History  Diagnosis Date  . Diabetes mellitus   . Hypertension   . Hypercholesterolemia   . Kidney stones   . Osteoporosis   . HLD (hyperlipidemia)   . GERD (gastroesophageal reflux disease)    Past Surgical History  Procedure Laterality Date  . Abdominal hysterectomy    . Back surgery    . Cholecystectomy     Current Outpatient Prescriptions on File Prior to Visit  Medication Sig Dispense Refill  . amLODipine-benazepril (LOTREL) 10-20 MG per capsule TAKE ONE CAPSULES BY MOUTH DAILY.  30 capsule  1  . atorvastatin (LIPITOR) 40 MG tablet TAKE ONE TABLET BY MOUTH DAILY.  30 tablet  1  . hydrochlorothiazide (HYDRODIURIL) 25 MG tablet TAKE ONE TABLET BY MOUTH DAILY.  30 tablet  1  . Insulin Pen Needle 31G X 5 MM MISC As directed  100 each  5  . metFORMIN (GLUCOPHAGE) 500 MG tablet TAKE ONE TABLET BY MOUTH 2 TIMES A DAY.  60 tablet  3  . metoprolol (LOPRESSOR) 50 MG tablet Take 1 tablet (50 mg total) by mouth 2 (two) times daily.  60 tablet  4  . telmisartan (MICARDIS) 80 MG tablet TAKE ONE TABLET BY MOUTH DAILY.  30 tablet  1   No current facility-administered medications on file prior to visit.   Allergies    Allergen Reactions  . Penicillins Anaphylaxis   History   Social History  . Marital Status: Married    Spouse Name: N/A    Number of Children: N/A  . Years of Education: N/A   Occupational History  . Not on file.   Social History Main Topics  . Smoking status: Never Smoker   . Smokeless tobacco: Not on file  . Alcohol Use: No  . Drug Use: No  . Sexual Activity:    Other Topics Concern  . Not on file   Social History Narrative  . No narrative on file      Review of Systems  All other systems reviewed and are negative.       Objective:   Physical Exam  Vitals reviewed. Constitutional: She is oriented to person, place, and time. She appears well-developed and well-nourished. No distress.  HENT:  Right Ear: External ear normal.  Left Ear: External ear normal.  Nose: Nose normal.  Mouth/Throat: Oropharynx is clear and moist. No oropharyngeal exudate.  Eyes: Conjunctivae are normal. Pupils are equal, round, and reactive to light. No scleral icterus.  Neck: Neck supple. No JVD present. No thyromegaly present.  Cardiovascular: Normal rate, regular rhythm, normal heart sounds and intact distal pulses.  Exam reveals no gallop and no friction rub.   No murmur heard. Pulmonary/Chest: Effort  normal and breath sounds normal. No respiratory distress. She has no wheezes. She has no rales. She exhibits no tenderness.  Abdominal: Soft. Bowel sounds are normal. She exhibits no distension. There is no tenderness. There is no rebound and no guarding.  Musculoskeletal: She exhibits no edema.  Lymphadenopathy:    She has no cervical adenopathy.  Neurological: She is oriented to person, place, and time. She has normal reflexes. She displays normal reflexes. No cranial nerve deficit. She exhibits normal muscle tone.  Skin: She is not diaphoretic. No erythema.          Assessment & Plan:  1. Type II or unspecified type diabetes mellitus without mention of complication,  uncontrolled In fasting for hemoglobin A1c. Increase Lantus to 50 units subcutaneous daily and increase 1 unit every day until fasting blood sugars are less than 130. I would then turn our attention to two-hour postprandial sugars  2. HTN (hypertension) Patient's blood pressure is adequately controlled.  3. HLD (hyperlipidemia) Return fasting for a CMP and fasting lipid panel. LDL is less than 100 given her history of diabetes.  Patient also received Prevnar 13 today in the office.

## 2013-12-15 ENCOUNTER — Other Ambulatory Visit: Payer: 59

## 2013-12-15 LAB — LIPID PANEL
Cholesterol: 165 mg/dL (ref 0–200)
HDL: 43 mg/dL (ref >39)
LDL Cholesterol: 87 mg/dL (ref 0–99)
Total CHOL/HDL Ratio: 3.8 Ratio
Triglycerides: 175 mg/dL — ABNORMAL HIGH (ref <150)
VLDL: 35 mg/dL (ref 0–40)

## 2013-12-15 LAB — COMPLETE METABOLIC PANEL WITH GFR
ALBUMIN: 3.8 g/dL (ref 3.5–5.2)
ALT: 14 U/L (ref 0–35)
AST: 12 U/L (ref 0–37)
Alkaline Phosphatase: 76 U/L (ref 39–117)
BILIRUBIN TOTAL: 0.8 mg/dL (ref 0.2–1.2)
BUN: 16 mg/dL (ref 6–23)
CO2: 27 mEq/L (ref 19–32)
Calcium: 9.5 mg/dL (ref 8.4–10.5)
Chloride: 103 mEq/L (ref 96–112)
Creat: 0.85 mg/dL (ref 0.50–1.10)
GFR, EST AFRICAN AMERICAN: 78 mL/min
GFR, Est Non African American: 68 mL/min
Glucose, Bld: 282 mg/dL — ABNORMAL HIGH (ref 70–99)
Potassium: 3.7 mEq/L (ref 3.5–5.3)
SODIUM: 141 meq/L (ref 135–145)
TOTAL PROTEIN: 6.7 g/dL (ref 6.0–8.3)

## 2013-12-15 LAB — HEMOGLOBIN A1C
Hgb A1c MFr Bld: 9.5 % — ABNORMAL HIGH (ref <5.7)
MEAN PLASMA GLUCOSE: 226 mg/dL — AB (ref <117)

## 2013-12-16 LAB — MICROALBUMIN, URINE: Microalb, Ur: 38.74 mg/dL — ABNORMAL HIGH (ref 0.00–1.89)

## 2013-12-24 ENCOUNTER — Other Ambulatory Visit: Payer: Self-pay | Admitting: Physician Assistant

## 2013-12-24 ENCOUNTER — Other Ambulatory Visit: Payer: Self-pay | Admitting: Family Medicine

## 2013-12-24 NOTE — Telephone Encounter (Signed)
Have been trying to reach patient.  Have been unable.  Denied refills so hopefully she will call office.

## 2013-12-24 NOTE — Telephone Encounter (Signed)
Have been trying to reach patient with no response.  Denied refills, hopefully patient will now call office.

## 2014-02-04 ENCOUNTER — Other Ambulatory Visit: Payer: Self-pay | Admitting: Physician Assistant

## 2014-02-04 ENCOUNTER — Other Ambulatory Visit: Payer: Self-pay | Admitting: Family Medicine

## 2014-02-05 NOTE — Telephone Encounter (Signed)
Refill appropriate and filled per protocol. 

## 2014-06-25 ENCOUNTER — Other Ambulatory Visit: Payer: Self-pay | Admitting: Family Medicine

## 2014-06-30 ENCOUNTER — Ambulatory Visit (INDEPENDENT_AMBULATORY_CARE_PROVIDER_SITE_OTHER): Payer: 59 | Admitting: Family Medicine

## 2014-06-30 ENCOUNTER — Encounter: Payer: Self-pay | Admitting: Family Medicine

## 2014-06-30 VITALS — BP 126/70 | HR 74 | Temp 99.2°F | Resp 18 | Ht 62.0 in | Wt 171.0 lb

## 2014-06-30 DIAGNOSIS — I10 Essential (primary) hypertension: Secondary | ICD-10-CM

## 2014-06-30 DIAGNOSIS — E119 Type 2 diabetes mellitus without complications: Secondary | ICD-10-CM

## 2014-06-30 DIAGNOSIS — J208 Acute bronchitis due to other specified organisms: Secondary | ICD-10-CM

## 2014-06-30 DIAGNOSIS — Z794 Long term (current) use of insulin: Secondary | ICD-10-CM

## 2014-06-30 LAB — COMPLETE METABOLIC PANEL WITH GFR
ALK PHOS: 61 U/L (ref 39–117)
ALT: 9 U/L (ref 0–35)
AST: 11 U/L (ref 0–37)
Albumin: 3.6 g/dL (ref 3.5–5.2)
BUN: 10 mg/dL (ref 6–23)
CO2: 26 meq/L (ref 19–32)
Calcium: 8.8 mg/dL (ref 8.4–10.5)
Chloride: 105 mEq/L (ref 96–112)
Creat: 0.84 mg/dL (ref 0.50–1.10)
GFR, EST NON AFRICAN AMERICAN: 69 mL/min
GFR, Est African American: 79 mL/min
GLUCOSE: 102 mg/dL — AB (ref 70–99)
POTASSIUM: 3.7 meq/L (ref 3.5–5.3)
Sodium: 140 mEq/L (ref 135–145)
Total Bilirubin: 0.5 mg/dL (ref 0.2–1.2)
Total Protein: 6.3 g/dL (ref 6.0–8.3)

## 2014-06-30 LAB — HEMOGLOBIN A1C
HEMOGLOBIN A1C: 7.9 % — AB (ref <5.7)
Mean Plasma Glucose: 180 mg/dL — ABNORMAL HIGH (ref <117)

## 2014-06-30 LAB — MICROALBUMIN, URINE: Microalb, Ur: 62.1 mg/dL — ABNORMAL HIGH (ref <2.0)

## 2014-06-30 MED ORDER — AZITHROMYCIN 250 MG PO TABS: ORAL_TABLET | ORAL | Status: DC

## 2014-06-30 NOTE — Progress Notes (Signed)
Subjective:    Patient ID: Catherine Burke, female    DOB: 04/08/1941, 73 y.o.   MRN: [id number]  HPI Patient is a very pleasant 73 year old white female with a history of diabetes mellitus type 2, hyperlipidemia, and hypertension. She has a history of noncompliance. She rarely follows up. Her blood pressure currently is well controlled at 126/70. She denies any chest pain. She has been having increasing shortness of breath recently with low-grade fever. She has a cough productive of green-yellow sputum. This seems to be worsening after 7 days. She has not been recently checking her blood sugars. She denies any hypoglycemia. She is not fasting today and therefore I cannot check a fasting lipid panel. Past Medical History  Diagnosis Date  . Diabetes mellitus   . Hypertension   . Hypercholesterolemia   . Kidney stones   . Osteoporosis   . HLD (hyperlipidemia)   . GERD (gastroesophageal reflux disease)    Past Surgical History  Procedure Laterality Date  . Abdominal hysterectomy    . Back surgery    . Cholecystectomy     Current Outpatient Prescriptions on File Prior to Visit  Medication Sig Dispense Refill  . amLODipine-benazepril (LOTREL) 10-20 MG per capsule TAKE ONE CAPSULES BY MOUTH DAILY.  30 capsule  6  . aspirin 81 MG tablet Take 81 mg by mouth daily.      Marland Kitchen atorvastatin (LIPITOR) 40 MG tablet TAKE ONE TABLET BY MOUTH DAILY.  30 tablet  6  . hydrochlorothiazide (HYDRODIURIL) 25 MG tablet TAKE ONE TABLET BY MOUTH DAILY.  30 tablet  6  . Insulin Glargine (LANTUS SOLOSTAR) 100 UNIT/ML Solostar Pen Inject 50 Units into the skin daily at 10 pm.  15 mL  6  . Insulin Pen Needle 31G X 5 MM MISC As directed  100 each  5  . metFORMIN (GLUCOPHAGE) 500 MG tablet TAKE ONE TABLET BY MOUTH 2 TIMES A DAY.  60 tablet  1  . metoprolol (LOPRESSOR) 50 MG tablet TAKE ONE TABLET BY MOUTH 2 TIMES A DAY.  60 tablet  6  . telmisartan (MICARDIS) 80 MG tablet TAKE ONE TABLET BY MOUTH DAILY.  30 tablet  6    No current facility-administered medications on file prior to visit.   Allergies  Allergen Reactions  . Penicillins Anaphylaxis   History   Social History  . Marital Status: Married    Spouse Name: N/A    Number of Children: N/A  . Years of Education: N/A   Occupational History  . Not on file.   Social History Main Topics  . Smoking status: Never Smoker   . Smokeless tobacco: Not on file  . Alcohol Use: No  . Drug Use: No  . Sexual Activity:    Other Topics Concern  . Not on file   Social History Narrative  . No narrative on file       Review of Systems  All other systems reviewed and are negative.      Objective:   Physical Exam  Vitals reviewed. Cardiovascular: Normal rate, regular rhythm and normal heart sounds.  Exam reveals no gallop.   No murmur heard. Pulmonary/Chest: Effort normal and breath sounds normal. No respiratory distress. She has no wheezes. She has no rales.  Abdominal: Soft. Bowel sounds are normal. She exhibits no distension and no mass. There is no tenderness. There is no rebound and no guarding.  Musculoskeletal: She exhibits no edema.  Assessment & Plan:  Diabetes mellitus, type II, insulin dependent - Plan: COMPLETE METABOLIC PANEL WITH GFR, Hemoglobin A1c, Microalbumin, urine, Lipid panel, CANCELED: Lipid panel  Acute bronchitis due to other specified organisms - Plan: azithromycin (ZITHROMAX) 250 MG tablet  Essential hypertension  I believe the patient has bronchitis. I'll start the patient on a Z-Pak. She is to take 500 mg the first day and 250 mg on days 2 through 5. Her blood pressures well controlled I will not make any changes in her medication. I will check a CMP hemoglobin A1c and urine microalbumin to monitor her diabetes. Ideally I would like her hemoglobin A1c to be less than 7.0. I have asked the patient to return fasting for a fasting lipid panel. She can get her flu vaccine when she is clinically improved  from her bronchitis.

## 2014-07-20 ENCOUNTER — Telehealth: Payer: Self-pay | Admitting: Family Medicine

## 2014-07-21 NOTE — Telephone Encounter (Signed)
Spoke to pt and she was to call in with her FBS and 2 hour pp BS.   FBS - 75,114,77,104,98,63,58,54 - (we increase her dose of Lantus to 60 units) pt states that she takes her insulin at night before bed and she feels jittery with vision changes when her BS drops that low.  2 hour pp bs - [phone]

## 2014-07-21 NOTE — Telephone Encounter (Signed)
Tried to call pt and no answer and mailbox is full

## 2014-07-21 NOTE — Telephone Encounter (Signed)
Decrease lantus to 55 units, continue check CBG, call in 1 week with readings

## 2014-07-22 NOTE — Telephone Encounter (Signed)
Pt aware and will call back in a week with readings

## 2014-08-11 ENCOUNTER — Other Ambulatory Visit: Payer: Self-pay | Admitting: Family Medicine

## 2014-08-11 NOTE — Telephone Encounter (Signed)
Refill appropriate and filled per protocol. 

## 2014-08-25 ENCOUNTER — Other Ambulatory Visit: Payer: Self-pay | Admitting: Family Medicine

## 2014-08-25 NOTE — Telephone Encounter (Signed)
Medication refilled per protocol. 

## 2014-10-28 ENCOUNTER — Other Ambulatory Visit: Payer: Self-pay | Admitting: Family Medicine

## 2015-02-03 ENCOUNTER — Encounter: Payer: Self-pay | Admitting: Family Medicine

## 2015-02-17 ENCOUNTER — Ambulatory Visit: Payer: Medicare PPO | Admitting: Family Medicine

## 2015-02-24 ENCOUNTER — Ambulatory Visit (INDEPENDENT_AMBULATORY_CARE_PROVIDER_SITE_OTHER): Payer: Medicare PPO | Admitting: Family Medicine

## 2015-02-24 ENCOUNTER — Encounter: Payer: Self-pay | Admitting: Family Medicine

## 2015-02-24 VITALS — BP 130/74 | HR 76 | Temp 97.6°F | Resp 18 | Ht 62.0 in | Wt 156.0 lb

## 2015-02-24 DIAGNOSIS — N189 Chronic kidney disease, unspecified: Secondary | ICD-10-CM

## 2015-02-24 DIAGNOSIS — I10 Essential (primary) hypertension: Secondary | ICD-10-CM | POA: Diagnosis not present

## 2015-02-24 DIAGNOSIS — N183 Chronic kidney disease, stage 3 unspecified: Secondary | ICD-10-CM

## 2015-02-24 DIAGNOSIS — E1122 Type 2 diabetes mellitus with diabetic chronic kidney disease: Secondary | ICD-10-CM

## 2015-02-24 DIAGNOSIS — E785 Hyperlipidemia, unspecified: Secondary | ICD-10-CM | POA: Diagnosis not present

## 2015-02-24 DIAGNOSIS — E1129 Type 2 diabetes mellitus with other diabetic kidney complication: Secondary | ICD-10-CM | POA: Diagnosis not present

## 2015-02-24 LAB — COMPREHENSIVE METABOLIC PANEL
ALT: 9 U/L (ref 0–35)
AST: 10 U/L (ref 0–37)
Albumin: 3.1 g/dL — ABNORMAL LOW (ref 3.5–5.2)
Alkaline Phosphatase: 89 U/L (ref 39–117)
BILIRUBIN TOTAL: 0.5 mg/dL (ref 0.2–1.2)
BUN: 10 mg/dL (ref 6–23)
CALCIUM: 8.5 mg/dL (ref 8.4–10.5)
CHLORIDE: 105 meq/L (ref 96–112)
CO2: 26 mEq/L (ref 19–32)
Creat: 0.8 mg/dL (ref 0.50–1.10)
Glucose, Bld: 404 mg/dL (ref 70–99)
Potassium: 3.6 mEq/L (ref 3.5–5.3)
Sodium: 142 mEq/L (ref 135–145)
Total Protein: 5.6 g/dL — ABNORMAL LOW (ref 6.0–8.3)

## 2015-02-24 LAB — URINALYSIS, ROUTINE W REFLEX MICROSCOPIC
BILIRUBIN URINE: NEGATIVE
Glucose, UA: 500 mg/dL — AB
KETONES UR: NEGATIVE mg/dL
Leukocytes, UA: NEGATIVE
Nitrite: NEGATIVE
Protein, ur: >300 mg/dL — AB
SPECIFIC GRAVITY, URINE: 1.02 (ref 1.005–1.030)
UROBILINOGEN UA: 0.2 mg/dL (ref 0.0–1.0)
pH: 6.5 (ref 5.0–8.0)

## 2015-02-24 LAB — CBC WITH DIFFERENTIAL/PLATELET
BASOS PCT: 1 % (ref 0–1)
Basophils Absolute: 0.1 10*3/uL (ref 0.0–0.1)
Eosinophils Absolute: 0.1 10*3/uL (ref 0.0–0.7)
Eosinophils Relative: 2 % (ref 0–5)
HEMATOCRIT: 42.5 % (ref 36.0–46.0)
HEMOGLOBIN: 13.9 g/dL (ref 12.0–15.0)
LYMPHS ABS: 1.6 10*3/uL (ref 0.7–4.0)
Lymphocytes Relative: 26 % (ref 12–46)
MCH: 27.5 pg (ref 26.0–34.0)
MCHC: 32.7 g/dL (ref 30.0–36.0)
MCV: 84 fL (ref 78.0–100.0)
MPV: 11.3 fL (ref 8.6–12.4)
Monocytes Absolute: 0.4 10*3/uL (ref 0.1–1.0)
Monocytes Relative: 6 % (ref 3–12)
NEUTROS PCT: 65 % (ref 43–77)
Neutro Abs: 4 10*3/uL (ref 1.7–7.7)
Platelets: 214 10*3/uL (ref 150–400)
RBC: 5.06 MIL/uL (ref 3.87–5.11)
RDW: 14 % (ref 11.5–15.5)
WBC: 6.1 10*3/uL (ref 4.0–10.5)

## 2015-02-24 LAB — URINALYSIS, MICROSCOPIC ONLY: CRYSTALS: NONE SEEN

## 2015-02-24 LAB — LIPID PANEL
Cholesterol: 232 mg/dL — ABNORMAL HIGH (ref 0–200)
HDL: 46 mg/dL (ref >=46)
LDL Cholesterol: 141 mg/dL — ABNORMAL HIGH (ref 0–99)
Total CHOL/HDL Ratio: 5 Ratio
Triglycerides: 225 mg/dL — ABNORMAL HIGH (ref <150)
VLDL: 45 mg/dL — AB (ref 0–40)

## 2015-02-24 LAB — TSH: TSH: 1.77 u[IU]/mL (ref 0.350–4.500)

## 2015-02-24 LAB — HEMOGLOBIN A1C, FINGERSTICK: Hgb A1C (fingerstick): 13 % — ABNORMAL HIGH (ref <5.7)

## 2015-02-24 MED ORDER — METFORMIN HCL 500 MG PO TABS: ORAL_TABLET | ORAL | Status: DC

## 2015-02-24 MED ORDER — HYDROCHLOROTHIAZIDE 25 MG PO TABS: 25.0000 mg | ORAL_TABLET | Freq: Every day | ORAL | Status: DC

## 2015-02-24 MED ORDER — METOPROLOL TARTRATE 50 MG PO TABS: ORAL_TABLET | ORAL | Status: DC

## 2015-02-24 MED ORDER — ATORVASTATIN CALCIUM 40 MG PO TABS: 40.0000 mg | ORAL_TABLET | Freq: Every day | ORAL | Status: DC

## 2015-02-24 MED ORDER — BENAZEPRIL HCL 20 MG PO TABS
20.0000 mg | ORAL_TABLET | Freq: Every day | ORAL | Status: DC
Start: 2015-02-24 — End: 2015-10-20

## 2015-02-24 MED ORDER — INSULIN GLARGINE 100 UNIT/ML SOLOSTAR PEN: PEN_INJECTOR | SUBCUTANEOUS | Status: DC

## 2015-02-24 NOTE — Patient Instructions (Addendum)
Stop the telmisartan Continue all other blood pressure medications Your A1C is 13% Restart lantus 20 units today, increase by 1 unit for fasting blood sugar > 150 for your fasting sugars each morning  - We will call on Friday with sugar results  Check your blood sugar fasting and before meals F/U 4 weeks for blood pressure and blood sugar check

## 2015-02-25 ENCOUNTER — Encounter: Payer: Self-pay | Admitting: Family Medicine

## 2015-02-25 ENCOUNTER — Telehealth: Payer: Self-pay | Admitting: Physician Assistant

## 2015-02-25 NOTE — Progress Notes (Signed)
Patient ID: Catherine Burke, female   DOB: 1941/08/08, 74 y.o.   MRN: [id number]   Subjective:    Patient ID: Catherine Burke, female    DOB: 10/29/1940, 74 y.o.   MRN: [id number]  Patient presents for Medication Review/ Refill  Pt here to f/u medications, she has not been seen since Oct 2015 history of uncontrolled DM , insulin was being titrated up, she states because of her schedule she has been unable to come in for a visit, has bene out of meds for at least 3 weeks including insulin, did not check CBG since she did not have meds. She has felt "bad" has episodes where she is fatigued and when sitting gets a heavy feeling in her chest but it goes away after taking a deep breath, denies chest pain, no SOB with exertion or pain. +polyuria, polydipsia past few months.     Review Of Systems:  GEN-+ fatigue, fever, weight loss,weakness, recent illness HEENT- denies eye drainage, change in vision, nasal discharge, CVS- denies chest pain, palpitations RESP- + SOB, cough, wheeze ABD- denies N/V, change in stools, abd pain GU- denies dysuria, hematuria, dribbling, incontinence MSK- denies joint pain, muscle aches, injury Neuro- denies headache, dizziness, syncope, seizure activity       Objective:    BP 130/74 mmHg  Pulse 76  Temp(Src) 97.6 F (36.4 C) (Oral)  Resp 18  Ht 5\' 2"  (1.575 m)  Wt 156 lb (70.761 kg)  BMI 28.53 kg/m2 GEN- NAD, alert and oriented x3 HEENT- PERRL, EOMI, non injected sclera, pink conjunctiva, MMM, oropharynx clear Neck- Supple, no thyromegaly CVS- RRR, no murmur RESP-CTAB ABD-NABS,soft,NT,ND EXT- No edema Pulses- Radial, DP- 2+  A1C 13%      Assessment & Plan:      Problem List Items Addressed This Visit    Hyperlipidemia    Restart lipitor, fasting labs today, goal LDL < 100      Relevant Medications   benazepril (LOTENSIN) 20 MG tablet   hydrochlorothiazide (HYDRODIURIL) 25 MG tablet   metoprolol (LOPRESSOR) 50 MG tablet   atorvastatin  (LIPITOR) 40 MG tablet   Other Relevant Orders   Lipid panel (Completed)   Essential hypertension    Despite missing meds BP looks okay, will d/c the telmisartan and keep on Lotrel, HCTZ, BB, we may be able to decrease some of the others as well Push fluids with CBG so elevated      Relevant Medications   benazepril (LOTENSIN) 20 MG tablet   hydrochlorothiazide (HYDRODIURIL) 25 MG tablet   metoprolol (LOPRESSOR) 50 MG tablet   atorvastatin (LIPITOR) 40 MG tablet   Other Relevant Orders   TSH (Completed)   Diabetes mellitus - Primary    Uncontrolled DM a1c at 13%, she will likley need large doses of insulin like previous, but as restarting will put back at 20 and slowly titrate her back up. Will f/u Friday with readings On ACE, STATIN and ASA Uncontrolled DM cause of her fatigue and not feeling well      Relevant Medications   benazepril (LOTENSIN) 20 MG tablet   metFORMIN (GLUCOPHAGE) 500 MG tablet   Insulin Glargine (LANTUS SOLOSTAR) 100 UNIT/ML Solostar Pen   atorvastatin (LIPITOR) 40 MG tablet   Other Relevant Orders   CBC with Differential/Platelet (Completed)   Comprehensive metabolic panel (Completed)   Hemoglobin A1C, fingerstick (Completed)   Urinalysis, Routine w reflex microscopic (not at [facility]) (Completed)   Chronic kidney disease (CKD), stage III (moderate)  Note: This dictation was prepared with Dragon dictation along with smaller phrase technology. Any transcriptional errors that result from this process are unintentional.

## 2015-02-25 NOTE — Assessment & Plan Note (Signed)
Restart lipitor, fasting labs today, goal LDL < 100

## 2015-02-25 NOTE — Assessment & Plan Note (Signed)
Uncontrolled DM a1c at 13%, she will likley need large doses of insulin like previous, but as restarting will put back at 20 and slowly titrate her back up. Will f/u Friday with readings On ACE, STATIN and ASA Uncontrolled DM cause of her fatigue and not feeling well

## 2015-02-25 NOTE — Telephone Encounter (Signed)
[facility] called last night to inform me that this patient's glucose was 404. I reviewed chart. At her visit yesterday with Dr. Buelah Manis they reviewed that her A1c was greater than 13. Dr. Buelah Manis started her on Lantus and educated her on when to check her blood sugars. Therefore,  I did not call patient.

## 2015-02-25 NOTE — Assessment & Plan Note (Signed)
Despite missing meds BP looks okay, will d/c the telmisartan and keep on Lotrel, HCTZ, BB, we may be able to decrease some of the others as well Push fluids with CBG so elevated

## 2015-03-02 ENCOUNTER — Encounter: Payer: Self-pay | Admitting: *Deleted

## 2015-03-03 ENCOUNTER — Telehealth: Payer: Self-pay | Admitting: Family Medicine

## 2015-03-03 NOTE — Telephone Encounter (Signed)
Pt made aware of insulin change.  Reminded to keep appt in 4 weeks.  Continue to keep record of blood sugars and bring to appt.  Call if has any below 100 or over 300 readings before then.

## 2015-03-03 NOTE — Telephone Encounter (Signed)
Increase lantus to 30 units, keep f/u appt

## 2015-03-03 NOTE — Telephone Encounter (Signed)
Has restarted Lipitor.  Her blood sugars have been  Fasting 6/23 -220,  6/24 -155, 6/25 -188, 6/26 - 148, 6/27 - 281, today 167.  Is currently out of town on vacation  Says taking 25 units of Lantus

## 2015-03-24 ENCOUNTER — Ambulatory Visit: Payer: Medicare PPO | Admitting: Family Medicine

## 2015-03-31 ENCOUNTER — Ambulatory Visit (INDEPENDENT_AMBULATORY_CARE_PROVIDER_SITE_OTHER): Payer: Medicare PPO | Admitting: Family Medicine

## 2015-03-31 ENCOUNTER — Encounter: Payer: Self-pay | Admitting: Family Medicine

## 2015-03-31 VITALS — BP 130/70 | HR 78 | Temp 98.6°F | Resp 16 | Ht 62.0 in | Wt 157.0 lb

## 2015-03-31 DIAGNOSIS — N189 Chronic kidney disease, unspecified: Secondary | ICD-10-CM | POA: Diagnosis not present

## 2015-03-31 DIAGNOSIS — B351 Tinea unguium: Secondary | ICD-10-CM | POA: Diagnosis not present

## 2015-03-31 DIAGNOSIS — E785 Hyperlipidemia, unspecified: Secondary | ICD-10-CM

## 2015-03-31 DIAGNOSIS — E1122 Type 2 diabetes mellitus with diabetic chronic kidney disease: Secondary | ICD-10-CM

## 2015-03-31 MED ORDER — INSULIN PEN NEEDLE 31G X 5 MM MISC
Status: AC
Start: 2015-03-31 — End: ?

## 2015-03-31 MED ORDER — CICLOPIROX 8 % EX SOLN: Freq: Every day | CUTANEOUS | Status: DC

## 2015-03-31 NOTE — Patient Instructions (Signed)
Continue current medications  Topical for nail fungus F/U Last week of Sept for diabetes

## 2015-03-31 NOTE — Progress Notes (Signed)
Patient ID: Catherine Burke, female   DOB: 03-06-1941, 74 y.o.   MRN: [id number]   Subjective:    Patient ID: Catherine Burke, female    DOB: November 11, 1940, 74 y.o.   MRN: [id number]  Patient presents for 4 week F/U  patient here to follow-up diabetes mellitus. At her last visit her A1c returned at 13% her cholesterol was also severely elevated. She is now back on Lantus she is up to 30 units her fasting blood sugars range from 70 to 120s she has had a few outliers at 167 and 187. She has not had any true hypoglycemia symptoms. She is also taking her blood pressure medicines as prescribed. Her only concern today is some discoloration of her left great toe this been present for the past couple months. She does get pedicures. Eyes any pain in the tab    Review Of Systems:per above   GEN- denies fatigue, fever, weight loss,weakness, recent illness HEENT- denies eye drainage, change in vision, nasal discharge, CVS- denies chest pain, palpitations RESP- denies SOB, cough, wheeze ABD- denies N/V, change in stools, abd pain GU- denies dysuria, hematuria, dribbling, incontinence MSK- denies joint pain, muscle aches, injury Neuro- denies headache, dizziness, syncope, seizure activity       Objective:    BP 130/70 mmHg  Pulse 78  Temp(Src) 98.6 F (37 C) (Oral)  Resp 16  Ht 5\' 2"  (1.575 m)  Wt 157 lb (71.215 kg)  BMI 28.71 kg/m2 GEN- NAD, alert and oriented x3 HEENT- PERRL, EOMI, non injected sclera, pink conjunctiva, MMM, oropharynx clear CVS- RRR, no murmur RESP-CTAB Skin- Left great toenail- medial half thickened and yellow from nail bed, NT, no friability, no lesions on other nails  Pulses- Radial, DP- 2+        Assessment & Plan:      Problem List Items Addressed This Visit    Hyperlipidemia   Diabetes mellitus    Diabetes is improving. I will continue her at the Lantus 30 units. I do not want her having hypoglycemia symptoms and her fasting blood sugars look good. She is going  to call if her fastings start to generally go greater than 160. She will also continue her metformin as well as her Lipitor. Her blood pressure is well controlled       Other Visit Diagnoses    Onychomycosis    -  Primary    with age, statin drug ,other comorbidiites see more harm in oral lamisil, will try the topical, discussed use with pt    Relevant Medications    ciclopirox (PENLAC) 8 % solution       Note: This dictation was prepared with Dragon dictation along with smaller phrase technology. Any transcriptional errors that result from this process are unintentional.

## 2015-03-31 NOTE — Assessment & Plan Note (Signed)
Diabetes is improving. I will continue her at the Lantus 30 units. I do not want her having hypoglycemia symptoms and her fasting blood sugars look good. She is going to call if her fastings start to generally go greater than 160. She will also continue her metformin as well as her Lipitor. Her blood pressure is well controlled

## 2015-04-27 ENCOUNTER — Telehealth: Payer: Self-pay | Admitting: Family Medicine

## 2015-04-27 NOTE — Telephone Encounter (Signed)
Pt left form at front desk about Nitrofurantoin 100 mg caps and Pradaxa 75 mg caps.  Both very expensive and can we help her with them.  Neither of these medications are on patient med list.  Have left a message for her to call me back.

## 2015-04-27 NOTE — Telephone Encounter (Signed)
Pt returned call and said she has no idea what I'm talking about.  Must have wrong patient.

## 2015-06-02 ENCOUNTER — Ambulatory Visit (INDEPENDENT_AMBULATORY_CARE_PROVIDER_SITE_OTHER): Payer: Medicare PPO | Admitting: Family Medicine

## 2015-06-02 ENCOUNTER — Encounter: Payer: Self-pay | Admitting: Family Medicine

## 2015-06-02 VITALS — BP 128/74 | HR 68 | Temp 98.5°F | Resp 16 | Ht 62.0 in | Wt 158.0 lb

## 2015-06-02 DIAGNOSIS — N183 Chronic kidney disease, stage 3 unspecified: Secondary | ICD-10-CM

## 2015-06-02 DIAGNOSIS — N189 Chronic kidney disease, unspecified: Secondary | ICD-10-CM | POA: Diagnosis not present

## 2015-06-02 DIAGNOSIS — E785 Hyperlipidemia, unspecified: Secondary | ICD-10-CM

## 2015-06-02 DIAGNOSIS — E1122 Type 2 diabetes mellitus with diabetic chronic kidney disease: Secondary | ICD-10-CM

## 2015-06-02 DIAGNOSIS — Z23 Encounter for immunization: Secondary | ICD-10-CM

## 2015-06-02 DIAGNOSIS — I10 Essential (primary) hypertension: Secondary | ICD-10-CM

## 2015-06-02 DIAGNOSIS — E1129 Type 2 diabetes mellitus with other diabetic kidney complication: Secondary | ICD-10-CM | POA: Diagnosis not present

## 2015-06-02 LAB — CBC WITH DIFFERENTIAL/PLATELET
BASOS PCT: 1 % (ref 0–1)
Basophils Absolute: 0.1 10*3/uL (ref 0.0–0.1)
EOS ABS: 0.3 10*3/uL (ref 0.0–0.7)
Eosinophils Relative: 3 % (ref 0–5)
HCT: 36.9 % (ref 36.0–46.0)
Hemoglobin: 12.1 g/dL (ref 12.0–15.0)
Lymphocytes Relative: 24 % (ref 12–46)
Lymphs Abs: 2.2 10*3/uL (ref 0.7–4.0)
MCH: 27.3 pg (ref 26.0–34.0)
MCHC: 32.8 g/dL (ref 30.0–36.0)
MCV: 83.3 fL (ref 78.0–100.0)
MONO ABS: 0.6 10*3/uL (ref 0.1–1.0)
MPV: 10.8 fL (ref 8.6–12.4)
Monocytes Relative: 7 % (ref 3–12)
NEUTROS PCT: 65 % (ref 43–77)
Neutro Abs: 5.9 10*3/uL (ref 1.7–7.7)
PLATELETS: 286 10*3/uL (ref 150–400)
RBC: 4.43 MIL/uL (ref 3.87–5.11)
RDW: 14.7 % (ref 11.5–15.5)
WBC: 9 10*3/uL (ref 4.0–10.5)

## 2015-06-02 LAB — COMPREHENSIVE METABOLIC PANEL
ALT: 11 U/L (ref 6–29)
AST: 12 U/L (ref 10–35)
Albumin: 3.6 g/dL (ref 3.6–5.1)
Alkaline Phosphatase: 58 U/L (ref 33–130)
BUN: 12 mg/dL (ref 7–25)
CHLORIDE: 108 mmol/L (ref 98–110)
CO2: 29 mmol/L (ref 20–31)
CREATININE: 0.89 mg/dL (ref 0.60–0.93)
Calcium: 9 mg/dL (ref 8.6–10.4)
GLUCOSE: 92 mg/dL (ref 70–99)
Potassium: 3.9 mmol/L (ref 3.5–5.3)
SODIUM: 145 mmol/L (ref 135–146)
Total Bilirubin: 0.8 mg/dL (ref 0.2–1.2)
Total Protein: 6.2 g/dL (ref 6.1–8.1)

## 2015-06-02 LAB — LIPID PANEL
CHOL/HDL RATIO: 2.6 ratio (ref <=5.0)
Cholesterol: 101 mg/dL — ABNORMAL LOW (ref 125–200)
HDL: 39 mg/dL — ABNORMAL LOW (ref >=46)
LDL CALC: 38 mg/dL (ref <130)
Triglycerides: 122 mg/dL (ref <150)
VLDL: 24 mg/dL (ref <30)

## 2015-06-02 NOTE — Progress Notes (Signed)
Patient ID: Catherine Burke, female   DOB: Apr 15, 1941, 74 y.o.   MRN: [id number]     Subjective:    Patient ID: Catherine Burke, female    DOB: 1941/06/05, 74 y.o.   MRN: [id number]  Patient presents for F/U  patient here to follow-up her diet. Her last A1c was 13%. She is currently on Lantus 30 units as well as metformin. She is taking all her medications as prior. Her blood sugars fasting have been 89-151 she had one hypoglycemic episode with a blood sugar 76. She denies any polyuria or polydipsia. She is due for shot today. Medications were reviewed in detail. She is not dizzy spells problems with her blood pressure.  Penlac working well for her nail fungus   Review Of Systems:  GEN- denies fatigue, fever, weight loss,weakness, recent illness HEENT- denies eye drainage, change in vision, nasal discharge, CVS- denies chest pain, palpitations RESP- denies SOB, cough, wheeze ABD- denies N/V, change in stools, abd pain GU- denies dysuria, hematuria, dribbling, incontinence MSK- denies joint pain, muscle aches, injury Neuro- denies headache, dizziness, syncope, seizure activity       Objective:    There were no vitals taken for this visit. GEN- NAD, alert and oriented x3 HEENT- PERRL, EOMI, non injected sclera, pink conjunctiva, MMM, oropharynx clear CVS- RRR, no murmur RESP-CTAB EXT- No edema Pulses- Radial, DP- 2+        Assessment & Plan:      Problem List Items Addressed This Visit    Hyperlipidemia - Primary   Relevant Orders   Lipid panel   Diabetes mellitus   Relevant Orders   CBC with Differential/Platelet   Comprehensive metabolic panel   Hemoglobin A1c   Chronic kidney disease (CKD), stage III (moderate)      Note: This dictation was prepared with Dragon dictation along with smaller phrase technology. Any transcriptional errors that result from this process are unintentional.

## 2015-06-02 NOTE — Patient Instructions (Signed)
Continue current medications We will call with lab results Flu Shot given Referral to eye doctor  F/U 3 months

## 2015-06-02 NOTE — Assessment & Plan Note (Signed)
Based on results, A1C should be significantly improved, getting her below 8% is the goal based on age

## 2015-06-02 NOTE — Assessment & Plan Note (Signed)
Well controlled, no change to meds 

## 2015-06-03 ENCOUNTER — Other Ambulatory Visit: Payer: Self-pay | Admitting: *Deleted

## 2015-06-03 LAB — HEMOGLOBIN A1C
Hgb A1c MFr Bld: 7.2 % — ABNORMAL HIGH (ref <5.7)
Mean Plasma Glucose: 160 mg/dL — ABNORMAL HIGH (ref <117)

## 2015-06-03 MED ORDER — ATORVASTATIN CALCIUM 20 MG PO TABS: 20.0000 mg | ORAL_TABLET | Freq: Every day | ORAL | Status: DC

## 2015-07-14 ENCOUNTER — Encounter: Payer: Self-pay | Admitting: Family Medicine

## 2015-07-14 ENCOUNTER — Ambulatory Visit (INDEPENDENT_AMBULATORY_CARE_PROVIDER_SITE_OTHER): Payer: Medicare PPO | Admitting: Family Medicine

## 2015-07-14 VITALS — BP 170/98 | HR 65 | Temp 99.5°F | Resp 16

## 2015-07-14 DIAGNOSIS — J029 Acute pharyngitis, unspecified: Secondary | ICD-10-CM

## 2015-07-14 DIAGNOSIS — J069 Acute upper respiratory infection, unspecified: Secondary | ICD-10-CM

## 2015-07-14 MED ORDER — FIRST-DUKES MOUTHWASH MT SUSP: OROMUCOSAL | Status: DC

## 2015-07-14 MED ORDER — CEFDINIR 300 MG PO CAPS: 300.0000 mg | ORAL_CAPSULE | Freq: Two times a day (BID) | ORAL | Status: DC

## 2015-07-14 NOTE — Progress Notes (Signed)
Patient ID: Catherine Burke, female   DOB: 1941/03/18, 74 y.o.   MRN: [id number]   Subjective:    Patient ID: Catherine Burke, female    DOB: [birth year], 74 y.o.   MRN: [id number]  Patient presents for Sore Throat  Sore throat for past 3 days, subjective fever, hoarse, clear sinus drainage. No cough. No body aches, no N/V. Keeps grandaughter who had stomach bug last week. She has been taking some OTC meds which she knows always runs her BP up. CBG have been < 120 fasting     Review Of Systems: per above   GEN- denies fatigue,+ fever, weight loss,weakness, recent illness HEENT- denies eye drainage, change in vision, +nasal discharge, CVS- denies chest pain, palpitations RESP- denies SOB, cough, wheeze ABD- denies N/V, change in stools, abd pain MSK- denies joint pain, muscle aches, injury Neuro- denies headache, dizziness, syncope, seizure activity       Objective:    BP 170/98 mmHg  Pulse 65  Temp(Src) 99.5 F (37.5 C)  Resp 16  SpO2 98% GEN- NAD, alert and oriented x3 HEENT- PERRL, EOMI, non injected sclera, pink conjunctiva, MMM, oropharynx mild injection, no exudates , TM clear bilat no effusion,  + maxillary sinus tenderness, clear rhinorrhea Neck- Supple, + LAD CVS- RRR, no murmur RESP-CTAB EXT- No edema Pulses- Radial 2+          Assessment & Plan:      Problem List Items Addressed This Visit    None    Visit Diagnoses    Acute pharyngitis, unspecified etiology    -  Primary    power out in clinic, unable to do any lab testing, cover with Omnicef, magic mouth wash, Coricidan for any cough, congestion.    Acute URI        Relevant Medications    cefdinir (OMNICEF) 300 MG capsule    Diphenhyd-Hydrocort-Nystatin (FIRST-DUKES MOUTHWASH) SUSP       Note: This dictation was prepared with Dragon dictation along with smaller phrase technology. Any transcriptional errors that result from this process are unintentional.

## 2015-10-20 ENCOUNTER — Other Ambulatory Visit: Payer: Self-pay | Admitting: *Deleted

## 2015-10-20 MED ORDER — BENAZEPRIL HCL 20 MG PO TABS: 20.0000 mg | ORAL_TABLET | Freq: Every day | ORAL | Status: DC

## 2015-10-20 NOTE — Telephone Encounter (Signed)
Received fax requesting refill on Benazepril.   Refill appropriate and filled per protocol.

## 2015-12-08 ENCOUNTER — Other Ambulatory Visit: Payer: Self-pay | Admitting: Family Medicine

## 2015-12-08 NOTE — Telephone Encounter (Signed)
Refill appropriate and filled per protocol. 

## 2016-06-02 ENCOUNTER — Other Ambulatory Visit: Payer: Self-pay | Admitting: Internal Medicine

## 2016-06-02 DIAGNOSIS — Z1239 Encounter for other screening for malignant neoplasm of breast: Secondary | ICD-10-CM

## 2016-06-12 ENCOUNTER — Other Ambulatory Visit: Payer: Self-pay | Admitting: Internal Medicine

## 2016-06-12 DIAGNOSIS — Z1231 Encounter for screening mammogram for malignant neoplasm of breast: Secondary | ICD-10-CM

## 2016-06-28 ENCOUNTER — Ambulatory Visit (HOSPITAL_COMMUNITY): Payer: Medicare Other

## 2016-07-12 ENCOUNTER — Ambulatory Visit (HOSPITAL_COMMUNITY): Payer: Medicare Other

## 2016-08-14 ENCOUNTER — Other Ambulatory Visit: Payer: Self-pay | Admitting: Orthopedic Surgery

## 2016-08-14 ENCOUNTER — Ambulatory Visit (HOSPITAL_COMMUNITY): Payer: Medicare Other

## 2016-09-11 ENCOUNTER — Ambulatory Visit (HOSPITAL_COMMUNITY): Payer: Medicare Other

## 2016-11-02 ENCOUNTER — Ambulatory Visit
Admission: RE | Admit: 2016-11-02 | Discharge: 2016-11-02 | Disposition: A | Discharge status: Home / Self Care | Payer: Medicare Other | Source: Ambulatory Visit | Attending: Internal Medicine | Admitting: Internal Medicine

## 2016-11-02 DIAGNOSIS — Z1231 Encounter for screening mammogram for malignant neoplasm of breast: Secondary | ICD-10-CM

## 2016-11-09 ENCOUNTER — Inpatient Hospital Stay
Admission: RE | Admit: 2016-11-09 | Discharge: 2016-11-09 | Disposition: A | Discharge status: Home / Self Care | Payer: Self-pay | Source: Ambulatory Visit | Attending: *Deleted | Admitting: *Deleted

## 2016-11-09 ENCOUNTER — Other Ambulatory Visit: Payer: Self-pay | Admitting: *Deleted

## 2016-11-09 DIAGNOSIS — Z9289 Personal history of other medical treatment: Secondary | ICD-10-CM

## 2016-11-15 ENCOUNTER — Other Ambulatory Visit: Payer: Self-pay | Admitting: Internal Medicine

## 2016-11-15 DIAGNOSIS — N6320 Unspecified lump in the left breast, unspecified quadrant: Secondary | ICD-10-CM

## 2016-11-15 DIAGNOSIS — R928 Other abnormal and inconclusive findings on diagnostic imaging of breast: Secondary | ICD-10-CM

## 2016-11-22 ENCOUNTER — Ambulatory Visit: Payer: Medicare Other

## 2016-11-28 ENCOUNTER — Ambulatory Visit
Admission: RE | Admit: 2016-11-28 | Discharge: 2016-11-28 | Disposition: A | Discharge status: Home / Self Care | Payer: Medicare Other | Source: Ambulatory Visit | Attending: Internal Medicine | Admitting: Internal Medicine

## 2016-11-28 DIAGNOSIS — N6320 Unspecified lump in the left breast, unspecified quadrant: Secondary | ICD-10-CM

## 2016-11-28 DIAGNOSIS — R928 Other abnormal and inconclusive findings on diagnostic imaging of breast: Secondary | ICD-10-CM

## 2017-06-13 ENCOUNTER — Other Ambulatory Visit: Payer: Self-pay | Admitting: Internal Medicine

## 2017-06-13 DIAGNOSIS — N183 Chronic kidney disease, stage 3 unspecified: Secondary | ICD-10-CM

## 2017-06-21 ENCOUNTER — Ambulatory Visit: Payer: Medicare Other

## 2017-06-28 ENCOUNTER — Ambulatory Visit
Admission: RE | Admit: 2017-06-28 | Discharge: 2017-06-28 | Disposition: A | Discharge status: Home / Self Care | Payer: Medicare Other | Source: Ambulatory Visit | Attending: Internal Medicine | Admitting: Internal Medicine

## 2017-06-28 DIAGNOSIS — N281 Cyst of kidney, acquired: Secondary | ICD-10-CM | POA: Diagnosis not present

## 2017-06-28 DIAGNOSIS — N183 Chronic kidney disease, stage 3 unspecified: Secondary | ICD-10-CM

## 2017-10-09 ENCOUNTER — Other Ambulatory Visit: Payer: Self-pay

## 2017-10-09 ENCOUNTER — Inpatient Hospital Stay
Admission: AD | Admit: 2017-10-09 | Discharge: 2017-10-13 | DRG: 683 | Disposition: A | Discharge status: Home / Self Care | Payer: Medicare Other | Source: Ambulatory Visit | Attending: Specialist | Admitting: Specialist

## 2017-10-09 DIAGNOSIS — N183 Chronic kidney disease, stage 3 unspecified: Secondary | ICD-10-CM

## 2017-10-09 DIAGNOSIS — Z7982 Long term (current) use of aspirin: Secondary | ICD-10-CM | POA: Diagnosis not present

## 2017-10-09 DIAGNOSIS — I10 Essential (primary) hypertension: Secondary | ICD-10-CM | POA: Diagnosis present

## 2017-10-09 DIAGNOSIS — K219 Gastro-esophageal reflux disease without esophagitis: Secondary | ICD-10-CM | POA: Diagnosis present

## 2017-10-09 DIAGNOSIS — Z90710 Acquired absence of both cervix and uterus: Secondary | ICD-10-CM

## 2017-10-09 DIAGNOSIS — E1122 Type 2 diabetes mellitus with diabetic chronic kidney disease: Secondary | ICD-10-CM | POA: Diagnosis present

## 2017-10-09 DIAGNOSIS — N2581 Secondary hyperparathyroidism of renal origin: Secondary | ICD-10-CM | POA: Diagnosis present

## 2017-10-09 DIAGNOSIS — Z87442 Personal history of urinary calculi: Secondary | ICD-10-CM

## 2017-10-09 DIAGNOSIS — D631 Anemia in chronic kidney disease: Secondary | ICD-10-CM | POA: Diagnosis present

## 2017-10-09 DIAGNOSIS — I160 Hypertensive urgency: Secondary | ICD-10-CM | POA: Diagnosis not present

## 2017-10-09 DIAGNOSIS — R808 Other proteinuria: Secondary | ICD-10-CM | POA: Diagnosis present

## 2017-10-09 DIAGNOSIS — Z794 Long term (current) use of insulin: Secondary | ICD-10-CM | POA: Diagnosis not present

## 2017-10-09 DIAGNOSIS — Z9049 Acquired absence of other specified parts of digestive tract: Secondary | ICD-10-CM

## 2017-10-09 DIAGNOSIS — Z803 Family history of malignant neoplasm of breast: Secondary | ICD-10-CM

## 2017-10-09 DIAGNOSIS — I129 Hypertensive chronic kidney disease with stage 1 through stage 4 chronic kidney disease, or unspecified chronic kidney disease: Principal | ICD-10-CM | POA: Diagnosis present

## 2017-10-09 DIAGNOSIS — Z79899 Other long term (current) drug therapy: Secondary | ICD-10-CM

## 2017-10-09 DIAGNOSIS — E785 Hyperlipidemia, unspecified: Secondary | ICD-10-CM | POA: Diagnosis present

## 2017-10-09 DIAGNOSIS — R319 Hematuria, unspecified: Secondary | ICD-10-CM | POA: Diagnosis present

## 2017-10-09 DIAGNOSIS — R809 Proteinuria, unspecified: Secondary | ICD-10-CM | POA: Diagnosis present

## 2017-10-09 DIAGNOSIS — Z8249 Family history of ischemic heart disease and other diseases of the circulatory system: Secondary | ICD-10-CM | POA: Diagnosis not present

## 2017-10-09 DIAGNOSIS — N179 Acute kidney failure, unspecified: Secondary | ICD-10-CM | POA: Diagnosis present

## 2017-10-09 DIAGNOSIS — M810 Age-related osteoporosis without current pathological fracture: Secondary | ICD-10-CM | POA: Diagnosis present

## 2017-10-09 HISTORY — DX: Proteinuria, unspecified: R80.9

## 2017-10-09 LAB — COMPREHENSIVE METABOLIC PANEL
ALBUMIN: 3.5 g/dL (ref 3.5–5.0)
ALT: 12 U/L — ABNORMAL LOW (ref 14–54)
AST: 15 U/L (ref 15–41)
Alkaline Phosphatase: 80 U/L (ref 38–126)
Anion gap: 11 (ref 5–15)
BUN: 37 mg/dL — AB (ref 6–20)
CO2: 24 mmol/L (ref 22–32)
Calcium: 8.8 mg/dL — ABNORMAL LOW (ref 8.9–10.3)
Chloride: 106 mmol/L (ref 101–111)
Creatinine, Ser: 2.58 mg/dL — ABNORMAL HIGH (ref 0.44–1.00)
GFR calc Af Amer: 19 mL/min — ABNORMAL LOW (ref >60)
GFR calc non Af Amer: 17 mL/min — ABNORMAL LOW (ref >60)
GLUCOSE: 245 mg/dL — AB (ref 65–99)
POTASSIUM: 3 mmol/L — AB (ref 3.5–5.1)
Sodium: 141 mmol/L (ref 135–145)
Total Bilirubin: 0.5 mg/dL (ref 0.3–1.2)
Total Protein: 7 g/dL (ref 6.5–8.1)

## 2017-10-09 LAB — GLUCOSE, CAPILLARY
GLUCOSE-CAPILLARY: 232 mg/dL — AB (ref 65–99)
Glucose-Capillary: 202 mg/dL — ABNORMAL HIGH (ref 65–99)

## 2017-10-09 LAB — TSH: TSH: 1.493 u[IU]/mL (ref 0.350–4.500)

## 2017-10-09 LAB — CBC
HEMATOCRIT: 32.3 % — AB (ref 35.0–47.0)
Hemoglobin: 10.8 g/dL — ABNORMAL LOW (ref 12.0–16.0)
MCH: 26.6 pg (ref 26.0–34.0)
MCHC: 33.6 g/dL (ref 32.0–36.0)
MCV: 79.2 fL — AB (ref 80.0–100.0)
Platelets: 299 10*3/uL (ref 150–440)
RBC: 4.08 MIL/uL (ref 3.80–5.20)
RDW: 13.5 % (ref 11.5–14.5)
WBC: 8.7 10*3/uL (ref 3.6–11.0)

## 2017-10-09 LAB — PROTIME-INR
INR: 0.96
Prothrombin Time: 12.7 seconds (ref 11.4–15.2)

## 2017-10-09 LAB — TYPE AND SCREEN
ABO/RH(D): A POS
ANTIBODY SCREEN: NEGATIVE

## 2017-10-09 LAB — APTT: APTT: 29 s (ref 24–36)

## 2017-10-09 LAB — HEMOGLOBIN A1C
Hgb A1c MFr Bld: 7.6 % — ABNORMAL HIGH (ref 4.8–5.6)
MEAN PLASMA GLUCOSE: 171.42 mg/dL

## 2017-10-09 MED ORDER — HYDRALAZINE HCL 20 MG/ML IJ SOLN
10.0000 mg | Freq: Once | INTRAMUSCULAR | Status: DC
Start: 2017-10-09 — End: 2017-10-13
  Filled 2017-10-09: qty 1

## 2017-10-09 MED ORDER — HYDRALAZINE HCL 50 MG PO TABS: 50.0000 mg | ORAL_TABLET | Freq: Three times a day (TID) | ORAL | Status: DC

## 2017-10-09 MED ORDER — TORSEMIDE 20 MG PO TABS
20.0000 mg | ORAL_TABLET | Freq: Every day | ORAL | Status: DC
  Administered 2017-10-10: 20 mg via ORAL
  Filled 2017-10-09: qty 1

## 2017-10-09 MED ORDER — ATORVASTATIN CALCIUM 10 MG PO TABS
10.0000 mg | ORAL_TABLET | Freq: Every morning | ORAL | Status: DC
  Administered 2017-10-10 – 2017-10-13 (×4): 10 mg via ORAL
  Filled 2017-10-09 (×4): qty 1

## 2017-10-09 MED ORDER — AMLODIPINE BESYLATE 5 MG PO TABS
5.0000 mg | ORAL_TABLET | Freq: Two times a day (BID) | ORAL | Status: DC
  Administered 2017-10-09: 5 mg via ORAL
  Filled 2017-10-09: qty 1

## 2017-10-09 MED ORDER — HYDRALAZINE HCL 20 MG/ML IJ SOLN
10.0000 mg | Freq: Four times a day (QID) | INTRAMUSCULAR | Status: DC | PRN
  Administered 2017-10-09 (×2): 10 mg via INTRAVENOUS
  Filled 2017-10-09: qty 1

## 2017-10-09 MED ORDER — POTASSIUM CHLORIDE CRYS ER 20 MEQ PO TBCR
40.0000 meq | EXTENDED_RELEASE_TABLET | Freq: Once | ORAL | Status: AC
  Administered 2017-10-09: 40 meq via ORAL
  Filled 2017-10-09: qty 2

## 2017-10-09 MED ORDER — ACETAMINOPHEN 325 MG PO TABS
650.0000 mg | ORAL_TABLET | Freq: Four times a day (QID) | ORAL | Status: DC | PRN
  Administered 2017-10-10: 650 mg via ORAL
  Filled 2017-10-09: qty 2

## 2017-10-09 MED ORDER — INSULIN GLARGINE 100 UNIT/ML ~~LOC~~ SOLN
15.0000 [IU] | Freq: Every day | SUBCUTANEOUS | Status: DC
Start: 2017-10-09 — End: 2017-10-11
  Administered 2017-10-09 – 2017-10-10 (×2): 15 [IU] via SUBCUTANEOUS
  Filled 2017-10-09 (×3): qty 0.15

## 2017-10-09 MED ORDER — ACETAMINOPHEN 650 MG RE SUPP: 650.0000 mg | Freq: Four times a day (QID) | RECTAL | Status: DC | PRN

## 2017-10-09 MED ORDER — HYDRALAZINE HCL 20 MG/ML IJ SOLN
10.0000 mg | INTRAMUSCULAR | Status: DC | PRN
  Administered 2017-10-10 (×2): 10 mg via INTRAVENOUS
  Filled 2017-10-09 (×2): qty 1

## 2017-10-09 MED ORDER — INSULIN ASPART 100 UNIT/ML ~~LOC~~ SOLN
0.0000 [IU] | Freq: Three times a day (TID) | SUBCUTANEOUS | Status: DC
Start: 2017-10-09 — End: 2017-10-13
  Administered 2017-10-09: 3 [IU] via SUBCUTANEOUS
  Administered 2017-10-10: 2 [IU] via SUBCUTANEOUS
  Administered 2017-10-10: 5 [IU] via SUBCUTANEOUS
  Administered 2017-10-10: 3 [IU] via SUBCUTANEOUS
  Administered 2017-10-11 (×2): 2 [IU] via SUBCUTANEOUS
  Administered 2017-10-11: 3 [IU] via SUBCUTANEOUS
  Administered 2017-10-12 (×2): 2 [IU] via SUBCUTANEOUS
  Filled 2017-10-09 (×10): qty 1

## 2017-10-09 MED ORDER — METOPROLOL SUCCINATE ER 50 MG PO TB24: 50.0000 mg | ORAL_TABLET | Freq: Every day | ORAL | Status: DC

## 2017-10-09 MED ORDER — SODIUM CHLORIDE 0.9 % IV SOLN: INTRAVENOUS | Status: DC

## 2017-10-09 MED ORDER — HYDRALAZINE HCL 50 MG PO TABS
50.0000 mg | ORAL_TABLET | Freq: Three times a day (TID) | ORAL | Status: DC
  Administered 2017-10-09 – 2017-10-10 (×3): 50 mg via ORAL
  Filled 2017-10-09 (×3): qty 1

## 2017-10-09 MED ORDER — INSULIN ASPART 100 UNIT/ML ~~LOC~~ SOLN
0.0000 [IU] | Freq: Every day | SUBCUTANEOUS | Status: DC
Start: 2017-10-09 — End: 2017-10-13
  Administered 2017-10-09 – 2017-10-10 (×2): 2 [IU] via SUBCUTANEOUS
  Filled 2017-10-09 (×2): qty 1

## 2017-10-09 MED ORDER — BENAZEPRIL HCL 20 MG PO TABS
20.0000 mg | ORAL_TABLET | Freq: Two times a day (BID) | ORAL | Status: DC
  Administered 2017-10-09 – 2017-10-13 (×9): 20 mg via ORAL
  Filled 2017-10-09 (×9): qty 1

## 2017-10-09 NOTE — Progress Notes (Signed)
Advanced Directive planning provided with [place], daughter, granddaughter, and husband. Prayer offered.     10/09/17 1700  Clinical Encounter Type  Visited With Patient and family together  Visit Type Spiritual support  Spiritual Encounters  Spiritual Needs Other (Comment) (Advanced Directive Planning)

## 2017-10-09 NOTE — H&P (Signed)
Catherine Burke at [place] NAME: Catherine Burke    MR#:  [id number]  DATE OF BIRTH:  Apr 30, 1941  DATE OF ADMISSION:  10/09/2017  PRIMARY CARE PHYSICIAN: Idelle Crouch, MD   REQUESTING/REFERRING PHYSICIAN: Dr Murlean Iba  CHIEF COMPLAINT:  Sent in for better control of blood pressure.  HISTORY OF PRESENT ILLNESS:  Catherine Burke  is a 77 y.o. female with a known history of hypertension presents with elevated blood pressure going on for several weeks.  She is also had nephrotic range proteinuria.  Nephrology center in for better control of blood pressure and potential kidney biopsy.  The patient states that she feels okay.  A little bit fatigued.  Her feet and legs are swollen.  Patient sent in for better control of her blood pressure.  PAST MEDICAL HISTORY:   Past Medical History:  Diagnosis Date  . Arthritis   . Diabetes mellitus   . GERD (gastroesophageal reflux disease)   . HLD (hyperlipidemia)   . Hypercholesterolemia   . Hypertension   . Kidney stones   . Osteoporosis   . Proteinuria     PAST SURGICAL HISTORY:   Past Surgical History:  Procedure Laterality Date  . ABDOMINAL HYSTERECTOMY    . BACK SURGERY    . BREAST CYST ASPIRATION     unsure of laterality   . CHOLECYSTECTOMY      SOCIAL HISTORY:   Social History   Tobacco Use  . Smoking status: Never Smoker  . Smokeless tobacco: Never Used  Substance Use Topics  . Alcohol use: No    Alcohol/week: 0.0 oz    FAMILY HISTORY:   Family History  Problem Relation Age of Onset  . Other Mother   . Heart attack Father   . Breast cancer Paternal Aunt     DRUG ALLERGIES:   Allergies  Allergen Reactions  . Penicillins Anaphylaxis    REVIEW OF SYSTEMS:  CONSTITUTIONAL: No fever, chills or sweats.  No headache.  Some weight loss.  Some fatigue.Marland Kitchen  EYES: No blurred or double vision.  EARS, NOSE, AND THROAT: No tinnitus or ear pain. No sore  throat RESPIRATORY: No cough, occasional shortness of breath, no wheezing or hemoptysis.  CARDIOVASCULAR: No chest pain, orthopnea, edema.  GASTROINTESTINAL: No nausea, vomiting, or abdominal pain. No blood in bowel movements.  Occasional diarrhea GENITOURINARY: No dysuria, hematuria.  ENDOCRINE: No polyuria, nocturia,  HEMATOLOGY: No anemia, easy bruising or bleeding SKIN: No rash or lesion. MUSCULOSKELETAL: Some knee pain NEUROLOGIC: No tingling, numbness, weakness.  PSYCHIATRY: No anxiety or depression.   MEDICATIONS AT HOME:   Prior to Admission medications   Medication Sig Start Date End Date Taking? Authorizing Provider  ACCU-CHEK AVIVA PLUS test strip TESTING TWICE DAILY. 08/11/14   Susy Frizzle, MD  aspirin 81 MG tablet Take 81 mg by mouth daily.    [provider]  atorvastatin (LIPITOR) 20 MG tablet Take 1 tablet (20 mg total) by mouth daily. 06/03/15   Alycia Rossetti, MD  benazepril (LOTENSIN) 20 MG tablet Take 1 tablet (20 mg total) by mouth daily. 10/20/15   Alycia Rossetti, MD  cefdinir (OMNICEF) 300 MG capsule Take 1 capsule (300 mg total) by mouth 2 (two) times daily. 07/14/15   Alycia Rossetti, MD  ciclopirox [facility]) 8 % solution Apply topically at bedtime. Apply over nail and surrounding skin. Apply daily over previous coat. After seven (7) days, may remove with alcohol and continue cycle.  03/31/15   Alycia Rossetti, MD  Diphenhyd-Hydrocort-Nystatin (FIRST-DUKES MOUTHWASH) SUSP Swish and swallow three times a day for sore throat 07/14/15   Alycia Rossetti, MD  hydrochlorothiazide (HYDRODIURIL) 25 MG tablet Take 1 tablet (25 mg total) by mouth daily. 02/24/15   Alycia Rossetti, MD  Insulin Glargine (LANTUS SOLOSTAR) 100 UNIT/ML Solostar Pen Inject 50-60 units at bedtime as directed 02/24/15   Alycia Rossetti, MD  Insulin Pen Needle 31G X 5 MM MISC As directed 03/31/15   Alycia Rossetti, MD  metFORMIN (GLUCOPHAGE) 500 MG tablet TAKE ONE TABLET BY MOUTH  2 TIMES A DAY. 12/08/15   Susy Frizzle, MD  metoprolol (LOPRESSOR) 50 MG tablet TAKE ONE TABLET BY MOUTH 2 TIMES A DAY. 02/24/15   Alycia Rossetti, MD   Medication reconciliation is still undergoing    VITAL SIGNS:  Blood pressure (!) 190/54, pulse 77, temperature 98.2 F (36.8 C), temperature source Oral, resp. rate 20, height 5\' 4"  (1.626 m), weight 68.4 kg (150 lb 12.7 oz), SpO2 98 %.  PHYSICAL EXAMINATION:  GENERAL:  77 y.o.-year-old patient lying in the bed with no acute distress.  EYES: Pupils equal, round, reactive to light and accommodation. No scleral icterus. Extraocular muscles intact.  HEENT: Head atraumatic, normocephalic. Oropharynx and nasopharynx clear.  NECK:  Supple, no jugular venous distention. No thyroid enlargement, no tenderness.  LUNGS: Normal breath sounds bilaterally, no wheezing, rales,rhonchi or crepitation. No use of accessory muscles of respiration.  CARDIOVASCULAR: S1, S2 normal. No murmurs, rubs, or gallops.  ABDOMEN: Soft, nontender, nondistended. Bowel sounds present. No organomegaly or mass.  EXTREMITIES: No pedal edema, cyanosis, or clubbing.  NEUROLOGIC: Cranial nerves II through XII are intact. Muscle strength 5/5 in all extremities. Sensation intact. Gait not checked.  PSYCHIATRIC: The patient is alert and oriented x 3.  SKIN: No rash, lesion, or ulcer.   LABORATORY PANEL:   Laboratory data ordered by me  EKG:   EKG ordered by me  IMPRESSION AND PLAN:   1.  Accelerated hypertension.  Add hydralazine 50 mg every 8 hours.  Patient already on Norvasc 5 mg twice daily, benazepril 20 mg twice daily Toprol-XL 50 mg daily and torsemide 20 mg daily.  She took all of her morning doses this morning. 2.  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 Nephrology considering kidney biopsy once blood pressure is better. 3.  Type 2 diabetes mellitus.  Put on sliding scale insulin.  Add on hemoglobin A1c.  Decrease dose of Lantus. 4.   Hyperlipidemia unspecified on Lipitor 5.  Fatigue.  Check a TSH  All the records are reviewed and case discussed with ED provider. Management plans discussed with the patient, family and they are in agreement.  CODE STATUS: Full code  TOTAL TIME TAKING CARE OF THIS PATIENT: 50 minutes.    [name] M.D on 10/09/2017 at 1:17 PM  Between 7am to 6pm - Pager - [phone]  After 6pm call admission pager [phone]  [facility]  [phone]  CC: Primary care physician; Idelle Crouch, MD

## 2017-10-09 NOTE — Progress Notes (Signed)
MD notified of BP. Per dr. Duane Boston okay to change hydralazine to q4h prn for SBP > 180. Keep sbp less than 200 for tonight, she does not want the pt bp to drop too quickly. Continue to monitor.

## 2017-10-09 NOTE — Progress Notes (Signed)
[facility], [place] 10/09/17  Subjective:   Patient is known to our practice from outpatient.  She has diabetes, hypertension, hyperlipidemia and history of kidney stones in the past For the past several weeks to months, she has been dealing with edema.  She has nephrotic range proteinuria.  Her serologies including hepatitis B and C, ANCA antibodies, ANA, SPEP and UPEP are negative Her serum creatinine has been steadily increasing from a baseline of 1.5/GFR 34 back in August 2018 to 1.8 in October, 1.96 in January and most recently up to 2.38 on February 1 and now to 2.58 Blood pressure has been poorly controlled. patient is admitted for control of blood pressure, evaluation of acute renal failure and Possible kidney biopsy   Objective:  Vital signs in last 24 hours:  Temp:  [98.2 F (36.8 C)] 98.2 F (36.8 C) (02/05 1154) Pulse Rate:  [77-97] 77 (02/05 1256) Resp:  [20] 20 (02/05 1154) BP: (190-207)/(54-101) 190/54 (02/05 1256) SpO2:  [98 %] 98 % (02/05 1154) Weight:  [68.4 kg (150 lb 12.7 oz)] 68.4 kg (150 lb 12.7 oz) (02/05 1245)  Weight change:  Filed Weights   10/09/17 1245  Weight: 68.4 kg (150 lb 12.7 oz)    Intake/Output:   No intake or output data in the 24 hours ending 10/09/17 1607   Physical Exam: General:  Well-appearing, laying in the bed  HEENT  anicteric, moist oral mucous membranes  Neck  supple  Pulm/lungs  clear to auscultation bilaterally  CVS/Heart  regular rate and rhythm, no rub or gallop  Abdomen:   Soft, nontender  Extremities:  + Edema  Neurologic:  Alert, oriented  Skin:  No acute rashes          Basic Metabolic Panel:  Recent Labs  Lab 10/09/17 1405  NA 141  K 3.0*  CL 106  CO2 24  GLUCOSE 245*  BUN 37*  CREATININE 2.58*  CALCIUM 8.8*     CBC: Recent Labs  Lab 10/09/17 1405  WBC 8.7  HGB 10.8*  HCT 32.3*  MCV 79.2*  PLT 299     No results found for: HEPBSAG, HEPBSAB,  HEPBIGM    Microbiology:  No results found for this or any previous visit (from the past 240 hour(s)).  Coagulation Studies: Recent Labs    10/09/17 1405  LABPROT 12.7  INR 0.96    Urinalysis: No results for input(s): COLORURINE, LABSPEC, PHURINE, GLUCOSEU, HGBUR, BILIRUBINUR, KETONESUR, PROTEINUR, UROBILINOGEN, NITRITE, LEUKOCYTESUR in the last 72 hours.  Invalid input(s): APPERANCEUR    Imaging: No results found.   Medications:    . amLODipine  5 mg Oral BID  . [START ON 10/10/2017] atorvastatin  10 mg Oral q morning - 10a  . benazepril  20 mg Oral BID  . hydrALAZINE  10 mg Intravenous Once  . hydrALAZINE  50 mg Oral Q8H  . insulin aspart  0-5 Units Subcutaneous QHS  . insulin aspart  0-9 Units Subcutaneous TID WC  . insulin glargine  15 Units Subcutaneous QHS  . [START ON 10/10/2017] metoprolol succinate  50 mg Oral Daily  . [START ON 10/10/2017] torsemide  20 mg Oral Daily   acetaminophen **OR** acetaminophen  Assessment/ Plan:  77 y.o.caucasian female With diabetes, hypertension, hyperlipidemia, history of kidney stones  1.  Acute renal failure 2.  Chronic kidney disease stage III, baseline Cr 1.5, GFR 34 3.  Type 2 diabetes with chronic kidney disease 4.  Nephrotic range proteinuria, 9.1 gm by  P/C ratio in Jan 2019 5.  Hematuria 6.  Severe hypertension 4.  Lower extremity edema 8.  Hyperlipidemia 9.  Secondary hyperparathyroidism  Plan: Resume home blood pressure medications IV hydralazine as needed to control blood pressure N.p.o. after midnight for possible biopsy tomorrow if blood pressure is controlled    LOS: 0 Adison Reifsteck Candiss Norse 2/5/20194:07 PM  [place], [place]

## 2017-10-10 DIAGNOSIS — R809 Proteinuria, unspecified: Secondary | ICD-10-CM | POA: Diagnosis present

## 2017-10-10 LAB — GLUCOSE, CAPILLARY
GLUCOSE-CAPILLARY: 225 mg/dL — AB (ref 65–99)
GLUCOSE-CAPILLARY: 213 mg/dL — AB (ref 65–99)
Glucose-Capillary: 231 mg/dL — ABNORMAL HIGH (ref 65–99)
Glucose-Capillary: 284 mg/dL — ABNORMAL HIGH (ref 65–99)
Glucose-Capillary: 184 mg/dL — ABNORMAL HIGH (ref 65–99)

## 2017-10-10 LAB — CBC
HCT: 30.5 % — ABNORMAL LOW (ref 35.0–47.0)
Hemoglobin: 10.1 g/dL — ABNORMAL LOW (ref 12.0–16.0)
MCH: 26.5 pg (ref 26.0–34.0)
MCHC: 33 g/dL (ref 32.0–36.0)
MCV: 80.3 fL (ref 80.0–100.0)
PLATELETS: 282 10*3/uL (ref 150–440)
RBC: 3.8 MIL/uL (ref 3.80–5.20)
RDW: 13.6 % (ref 11.5–14.5)
WBC: 9.4 10*3/uL (ref 3.6–11.0)

## 2017-10-10 LAB — COMPREHENSIVE METABOLIC PANEL
ALBUMIN: 3 g/dL — AB (ref 3.5–5.0)
ALT: 11 U/L — AB (ref 14–54)
AST: 13 U/L — AB (ref 15–41)
Alkaline Phosphatase: 69 U/L (ref 38–126)
Anion gap: 9 (ref 5–15)
BUN: 40 mg/dL — AB (ref 6–20)
CHLORIDE: 108 mmol/L (ref 101–111)
CO2: 23 mmol/L (ref 22–32)
CREATININE: 2.72 mg/dL — AB (ref 0.44–1.00)
Calcium: 8.4 mg/dL — ABNORMAL LOW (ref 8.9–10.3)
GFR calc Af Amer: 18 mL/min — ABNORMAL LOW (ref >60)
GFR calc non Af Amer: 16 mL/min — ABNORMAL LOW (ref >60)
Glucose, Bld: 222 mg/dL — ABNORMAL HIGH (ref 65–99)
POTASSIUM: 3.2 mmol/L — AB (ref 3.5–5.1)
SODIUM: 140 mmol/L (ref 135–145)
Total Bilirubin: 0.6 mg/dL (ref 0.3–1.2)
Total Protein: 6.3 g/dL — ABNORMAL LOW (ref 6.5–8.1)

## 2017-10-10 LAB — APTT: aPTT: 29 seconds (ref 24–36)

## 2017-10-10 LAB — PROTIME-INR
INR: 0.87
PROTHROMBIN TIME: 11.7 s (ref 11.4–15.2)

## 2017-10-10 MED ORDER — POTASSIUM CHLORIDE CRYS ER 20 MEQ PO TBCR
20.0000 meq | EXTENDED_RELEASE_TABLET | Freq: Once | ORAL | Status: AC
  Administered 2017-10-10: 20 meq via ORAL
  Filled 2017-10-10: qty 1

## 2017-10-10 MED ORDER — HYDRALAZINE HCL 50 MG PO TABS
100.0000 mg | ORAL_TABLET | Freq: Three times a day (TID) | ORAL | Status: DC
  Administered 2017-10-10 – 2017-10-13 (×9): 100 mg via ORAL
  Filled 2017-10-10 (×9): qty 2

## 2017-10-10 MED ORDER — METOPROLOL SUCCINATE ER 50 MG PO TB24
100.0000 mg | ORAL_TABLET | Freq: Every day | ORAL | Status: DC
  Administered 2017-10-10 – 2017-10-13 (×4): 100 mg via ORAL
  Filled 2017-10-10: qty 2
  Filled 2017-10-10 (×2): qty 1
  Filled 2017-10-10: qty 2

## 2017-10-10 MED ORDER — AMLODIPINE BESYLATE 10 MG PO TABS
10.0000 mg | ORAL_TABLET | Freq: Two times a day (BID) | ORAL | Status: DC
  Administered 2017-10-10 – 2017-10-11 (×3): 10 mg via ORAL
  Filled 2017-10-10 (×3): qty 1

## 2017-10-10 NOTE — Progress Notes (Signed)
Inpatient Diabetes Program Recommendations  AACE/ADA: New Consensus Statement on Inpatient Glycemic Control (2015)  Target Ranges:  Prepandial:   less than 140 mg/dL      Peak postprandial:   less than 180 mg/dL (1-2 hours)      Critically ill patients:  140 - 180 mg/dL   Lab Results  Component Value Date   GLUCAP 284 (H) 10/10/2017   HGBA1C 7.6 (H) 10/09/2017    Review of Glycemic ControlResults for PAMLEA, FINDER (MRN [id number]) as of 10/10/2017 11:07  Ref. Range 10/09/2017 17:05 10/09/2017 21:41 10/10/2017 07:52 10/10/2017 10:59  Glucose-Capillary Latest Ref Range: 65 - 99 mg/dL 232 (H) 202 (H) 231 (H) 284 (H)   Diabetes history: Type 2 DM Outpatient Diabetes medications: Lantus 20 units q HS Current orders for Inpatient glycemic control:  Lantus 15 units q HS, Novolog sensitive tid with meals and HS  Inpatient Diabetes Program Recommendations:   Please consider adding Novolog meal coverage 3 units tid with meals (Hold if patient eats less than 50%).  Will follow.   Thanks,  Adah Perl, RN, BC-ADM Inpatient Diabetes Coordinator Pager [phone] (8a-5p)

## 2017-10-10 NOTE — Progress Notes (Signed)
[facility] [place] NAME: Catherine Burke    MR#:  [id number]  DATE OF BIRTH:  02/12/1941  SUBJECTIVE:   Patient without complaints this morning. Blood pressure still high. Patient will not be able to get biopsy today due to elevated blood pressure. Patient denies headache, nausea vomiting or abdominal pain. No lower extremity edema or shortness of breath.  REVIEW OF SYSTEMS:    Review of Systems  Constitutional: Negative for fever, chills weight loss HENT: Negative for ear pain, nosebleeds, congestion, facial swelling, rhinorrhea, neck pain, neck stiffness and ear discharge.   Respiratory: Negative for cough, shortness of breath, wheezing  Cardiovascular: Negative for chest pain, palpitations and leg swelling.  Gastrointestinal: Negative for heartburn, abdominal pain, vomiting, diarrhea or consitpation Genitourinary: Negative for dysuria, urgency, frequency, hematuria Musculoskeletal: Negative for back pain or joint pain Neurological: Negative for dizziness, seizures, syncope, focal weakness,  numbness and headaches.  Hematological: Does not bruise/bleed easily.  Psychiatric/Behavioral: Negative for hallucinations, confusion, dysphoric mood    Tolerating Diet: yes      DRUG ALLERGIES:   Allergies  Allergen Reactions  . Penicillins Anaphylaxis    VITALS:  Blood pressure (!) 193/61, pulse 92, temperature 98 F (36.7 C), temperature source Oral, resp. rate 16, height 5\' 4"  (1.626 m), weight 68.4 kg (150 lb 12.7 oz), SpO2 100 %.  PHYSICAL EXAMINATION:  Constitutional: Appears well-developed and well-nourished. No distress. HENT: Normocephalic. Marland Kitchen Oropharynx is clear and moist.  Eyes: Conjunctivae and EOM are normal. PERRLA, no scleral icterus.  Neck: Normal ROM. Neck supple. No JVD. No tracheal deviation. CVS: RRR, S1/S2 +,2/6 SEM no gallops, no carotid bruit.  Pulmonary: Effort and breath sounds normal, no stridor, rhonchi,  wheezes, rales.  Abdominal: Soft. BS +,  no distension, tenderness, rebound or guarding.  Musculoskeletal: Normal range of motion. No edema and no tenderness.  Neuro: Alert. CN 2-12 grossly intact. No focal deficits. Skin: Skin is warm and dry. No rash noted. Psychiatric: Normal mood and affect.      LABORATORY PANEL:   CBC Recent Labs  Lab 10/10/17 0412  WBC 9.4  HGB 10.1*  HCT 30.5*  PLT 282   ------------------------------------------------------------------------------------------------------------------  Chemistries  Recent Labs  Lab 10/10/17 0412  NA 140  K 3.2*  CL 108  CO2 23  GLUCOSE 222*  BUN 40*  CREATININE 2.72*  CALCIUM 8.4*  AST 13*  ALT 11*  ALKPHOS 69  BILITOT 0.6   ------------------------------------------------------------------------------------------------------------------  Cardiac Enzymes No results for input(s): TROPONINI in the last 168 hours. ------------------------------------------------------------------------------------------------------------------  RADIOLOGY:  No results found.   ASSESSMENT AND PLAN:   77 year old female with history of chronic kidney disease stage III, hyperlipidemia and diabetes who presents due to elevated blood pressure.   1. Accelerated essential hypertension: Increase hydralazine, Norvasc and metoprolol for better blood pressure control. Continue Benzapril 20 mg daily  2. 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 Type 2 diabetes: Hemoglobin A1c 7.6  Continue sliding scale and Lantus  4. Hyperlipidemia: Continue statin   Management plans discussed with the patient and she is in agreement.  CODE STATUS: Full  TOTAL TIME TAKING CARE OF THIS PATIENT: 30 minutes.   Case discussed with Dr. Candiss Norse  POSSIBLE D/C 1-2 days,  DEPENDING ON CLINICAL CONDITION.   Jaramie Bastos M.D on 10/10/2017 at 9:24 AM  Between 7am to 6pm - Pager - [phone] After 6pm go to [email or web address] - password [email or web address] [facility]  Office  [phone]  CC: Primary care physician; Idelle Crouch, MD  Note: This dictation was prepared with Dragon dictation along with smaller phrase technology. Any transcriptional errors that result from this process are unintentional.

## 2017-10-10 NOTE — Progress Notes (Signed)
[facility] [place], [place] 10/10/17  Subjective:   Patient is known to our practice from outpatient.  She has diabetes, hypertension, hyperlipidemia and history of kidney stones in the past For the past several weeks to months, she has been dealing with edema.  She has nephrotic range proteinuria.  Her serologies including hepatitis B and C, ANCA antibodies, ANA, SPEP and UPEP are negative Her serum creatinine has been steadily increasing from a baseline of 1.5/GFR 34 back in August 2018 to 1.8 in October, 1.96 in January and most recently up to 2.38 on February 1 and now to 2.72 Blood pressure has been poorly controlled. patient is admitted for control of blood pressure, evaluation of acute renal failure and Possible kidney biopsy   Objective:  Vital signs in last 24 hours:  Temp:  [98 F (36.7 C)-99.6 F (37.6 C)] 98 F (36.7 C) (02/06 0743) Pulse Rate:  [64-97] 69 (02/06 1037) Resp:  [14-20] 14 (02/06 1037) BP: (160-220)/(50-145) 160/56 (02/06 1037) SpO2:  [96 %-100 %] 98 % (02/06 1037)  Weight change:  Filed Weights   10/09/17 1245  Weight: 68.4 kg (150 lb 12.7 oz)    Intake/Output:    Intake/Output Summary (Last 24 hours) at 10/10/2017 1247 Last data filed at 10/10/2017 1000 Gross per 24 hour  Intake 240 ml  Output 1350 ml  Net -1110 ml     Physical Exam: General:  Well-appearing, laying in the bed  HEENT  anicteric, moist oral mucous membranes  Neck  supple  Pulm/lungs  clear to auscultation bilaterally  CVS/Heart  regular rate and rhythm, no rub or gallop  Abdomen:   Soft, nontender  Extremities:  + Edema  Neurologic:  Alert, oriented  Skin:  No acute rashes          Basic Metabolic Panel:  Recent Labs  Lab 10/09/17 1405 10/10/17 0412  NA 141 140  K 3.0* 3.2*  CL 106 108  CO2 24 23  GLUCOSE 245* 222*  BUN 37* 40*  CREATININE 2.58* 2.72*  CALCIUM 8.8* 8.4*     CBC: Recent Labs  Lab 10/09/17 1405 10/10/17 0412  WBC 8.7  9.4  HGB 10.8* 10.1*  HCT 32.3* 30.5*  MCV 79.2* 80.3  PLT 299 282     No results found for: HEPBSAG, HEPBSAB, HEPBIGM    Microbiology:  No results found for this or any previous visit (from the past 240 hour(s)).  Coagulation Studies: Recent Labs    10/09/17 1405 10/10/17 0412  LABPROT 12.7 11.7  INR 0.96 0.87    Urinalysis: No results for input(s): COLORURINE, LABSPEC, PHURINE, GLUCOSEU, HGBUR, BILIRUBINUR, KETONESUR, PROTEINUR, UROBILINOGEN, NITRITE, LEUKOCYTESUR in the last 72 hours.  Invalid input(s): APPERANCEUR    Imaging: No results found.   Medications:   . sodium chloride     . amLODipine  10 mg Oral BID  . atorvastatin  10 mg Oral q morning - 10a  . benazepril  20 mg Oral BID  . hydrALAZINE  10 mg Intravenous Once  . hydrALAZINE  100 mg Oral Q8H  . insulin aspart  0-5 Units Subcutaneous QHS  . insulin aspart  0-9 Units Subcutaneous TID WC  . insulin glargine  15 Units Subcutaneous QHS  . metoprolol succinate  100 mg Oral Daily  . torsemide  20 mg Oral Daily   acetaminophen **OR** acetaminophen, hydrALAZINE  Assessment/ Plan:  77 y.o. caucasian female With diabetes, hypertension, hyperlipidemia, history of kidney stones  1.  Acute renal failure  2.  Chronic kidney disease stage III, baseline Cr 1.5, GFR 34 3.  Type 2 diabetes with chronic kidney disease 4.  Nephrotic range proteinuria, 9.1 gm by P/C ratio in Jan 2019 5.  Hematuria 6.  Severe hypertension 4.  Lower extremity edema 8.  Hyperlipidemia 9.  Secondary hyperparathyroidism  Plan: Resume home blood pressure medications IV hydralazine as needed to control blood pressure Current hypertensive regimen includes amlodipine 10 mg twice a day, benazepril 20 twice a day, hydralazine 100 mg every 8 hours, metoprolol 100 mg daily and torsemide 20 mg daily -Hold torsemide - N.p.o. after midnight for possible biopsy tomorrow if blood pressure is controlled    LOS: [place] 2/6/201912:47 PM  [facility] [place], [place]

## 2017-10-11 ENCOUNTER — Inpatient Hospital Stay: Payer: Medicare Other

## 2017-10-11 DIAGNOSIS — I160 Hypertensive urgency: Secondary | ICD-10-CM

## 2017-10-11 LAB — GLUCOSE, CAPILLARY
GLUCOSE-CAPILLARY: 189 mg/dL — AB (ref 65–99)
GLUCOSE-CAPILLARY: 226 mg/dL — AB (ref 65–99)
GLUCOSE-CAPILLARY: 181 mg/dL — AB (ref 65–99)
Glucose-Capillary: 167 mg/dL — ABNORMAL HIGH (ref 65–99)

## 2017-10-11 LAB — BASIC METABOLIC PANEL
Anion gap: 9 (ref 5–15)
BUN: 47 mg/dL — ABNORMAL HIGH (ref 6–20)
CHLORIDE: 109 mmol/L (ref 101–111)
CO2: 23 mmol/L (ref 22–32)
CREATININE: 3.1 mg/dL — AB (ref 0.44–1.00)
Calcium: 8.5 mg/dL — ABNORMAL LOW (ref 8.9–10.3)
GFR calc non Af Amer: 13 mL/min — ABNORMAL LOW (ref >60)
GFR, EST AFRICAN AMERICAN: 16 mL/min — AB (ref >60)
Glucose, Bld: 181 mg/dL — ABNORMAL HIGH (ref 65–99)
Potassium: 3.4 mmol/L — ABNORMAL LOW (ref 3.5–5.1)
SODIUM: 141 mmol/L (ref 135–145)

## 2017-10-11 LAB — MRSA PCR SCREENING: MRSA by PCR: POSITIVE — AB

## 2017-10-11 LAB — MAGNESIUM: Magnesium: 1.9 mg/dL (ref 1.7–2.4)

## 2017-10-11 MED ORDER — SODIUM CHLORIDE 0.9 % IV SOLN
INTRAVENOUS | Status: DC
  Administered 2017-10-11: 10:00:00 via INTRAVENOUS

## 2017-10-11 MED ORDER — CHLORHEXIDINE GLUCONATE CLOTH 2 % EX PADS
6.0000 | MEDICATED_PAD | Freq: Every day | CUTANEOUS | Status: DC
  Administered 2017-10-12 – 2017-10-13 (×2): 6 via TOPICAL

## 2017-10-11 MED ORDER — CLONIDINE HCL 0.1 MG PO TABS: 0.1000 mg | ORAL_TABLET | Freq: Once | ORAL | Status: DC

## 2017-10-11 MED ORDER — MUPIROCIN 2 % EX OINT
1.0000 "application " | TOPICAL_OINTMENT | Freq: Two times a day (BID) | CUTANEOUS | Status: DC
  Administered 2017-10-11 – 2017-10-13 (×4): 1 via NASAL
  Filled 2017-10-11: qty 22

## 2017-10-11 MED ORDER — INSULIN GLARGINE 100 UNIT/ML ~~LOC~~ SOLN
20.0000 [IU] | Freq: Every day | SUBCUTANEOUS | Status: DC
  Administered 2017-10-11 – 2017-10-12 (×2): 20 [IU] via SUBCUTANEOUS
  Filled 2017-10-11 (×3): qty 0.2

## 2017-10-11 MED ORDER — NITROGLYCERIN 2 % TD OINT
0.5000 [in_us] | TOPICAL_OINTMENT | Freq: Four times a day (QID) | TRANSDERMAL | Status: AC
  Administered 2017-10-11 (×2): 0.5 [in_us] via TOPICAL
  Filled 2017-10-11 (×2): qty 1

## 2017-10-11 MED ORDER — INSULIN ASPART 100 UNIT/ML ~~LOC~~ SOLN
3.0000 [IU] | Freq: Three times a day (TID) | SUBCUTANEOUS | Status: DC
  Administered 2017-10-11 – 2017-10-13 (×4): 3 [IU] via SUBCUTANEOUS
  Filled 2017-10-11 (×4): qty 1

## 2017-10-11 MED ORDER — NICARDIPINE HCL IN NACL 20-0.86 MG/200ML-% IV SOLN
3.0000 mg/h | INTRAVENOUS | Status: DC
  Administered 2017-10-11 – 2017-10-12 (×4): 3 mg/h via INTRAVENOUS
  Filled 2017-10-11 (×4): qty 200

## 2017-10-11 NOTE — Consult Note (Signed)
Name: Catherine Burke MRN: [id number] DOB: [birth year]    ADMISSION DATE:  10/09/2017 CONSULTATION DATE: 10/11/2017  REFERRING MD : Dr. Benjie Karvonen   CHIEF COMPLAINT: Hypertension   BRIEF PATIENT DESCRIPTION:  77 yo female admitted 02/5 with CKD stage III and nephrotic range proteinuria with plans to undergo a kidney biopsy on 02/7, however due to hypertension procedure canceled and pt transferred to stepdown unit for nicardipine gtt    SIGNIFICANT EVENTS  02/5-Pt admitted to [facility] unit  02/7-Kidney biopsy canceled due to hypertension pt transferred to stepdown unit for nicardipine gtt   STUDIES:  US Renal Artery Duplex Complete 02/7>>No evidence of hemodynamically significant renal artery stenosis. Echogenic renal parenchyma bilaterally consistent with medical renal disease. Bilateral simple renal cysts.  HISTORY OF PRESENT ILLNESS:   This is a 77 yo female with a PMH of Proteinuria, Osteoporosis, Kidney Stones, HTN, Hypercholesteremia, Hyperlipidemia, GERD, Diabetes Mellitus, and Arthritis.  She presented to [facility] ER 02/5 with elevated blood pressure onset of symptoms several weeks prior to presentation.  She was subsequently admitted to [facility] unit by hospitalist team for better blood pressure control for possible kidney biopsy.  On 02/7 she was scheduled for kidney biopsy, however due to hypertension the pt was transferred to the stepdown unit for initiation of nicardipine gtt.  PAST MEDICAL HISTORY :   has a past medical history of Arthritis, Diabetes mellitus, GERD (gastroesophageal reflux disease), HLD (hyperlipidemia), Hypercholesterolemia, Hypertension, Kidney stones, Osteoporosis, and Proteinuria.  has a past surgical history that includes Abdominal hysterectomy; Back surgery; Cholecystectomy; and Breast cyst aspiration. Prior to Admission medications   Medication Sig Start Date End Date Taking? Authorizing Provider  ACCU-CHEK AVIVA PLUS test strip TESTING TWICE DAILY. 08/11/14   Yes Susy Frizzle, MD  amLODipine (NORVASC) 5 MG tablet Take 5 mg by mouth 2 (two) times daily.   Yes [provider]  atorvastatin (LIPITOR) 20 MG tablet Take 1 tablet (20 mg total) by mouth daily. Patient taking differently: Take 10 mg by mouth daily.  06/03/15  Yes [place], Modena Nunnery, MD  benazepril (LOTENSIN) 20 MG tablet Take 1 tablet (20 mg total) by mouth daily. Patient taking differently: Take 20 mg by mouth 2 (two) times daily.  10/20/15  Yes [place], Modena Nunnery, MD  Insulin Glargine (LANTUS SOLOSTAR) 100 UNIT/ML Solostar Pen Inject 50-60 units at bedtime as directed Patient taking differently: Inject 20 Units into the skin at bedtime.  02/24/15  Yes [place], Modena Nunnery, MD  Insulin Pen Needle 31G X 5 MM MISC As directed 03/31/15  Yes [place], Modena Nunnery, MD  metoprolol succinate (TOPROL-XL) 50 MG 24 hr tablet Take 50 mg by mouth daily. Take with or immediately following a meal.   Yes [provider]  torsemide (DEMADEX) 20 MG tablet Take 20 mg by mouth every morning.   Yes [provider]  cefdinir (OMNICEF) 300 MG capsule Take 1 capsule (300 mg total) by mouth 2 (two) times daily. Patient not taking: Reported on 10/09/2017 07/14/15   Alycia Rossetti, MD  ciclopirox [facility]) 8 % solution Apply topically at bedtime. Apply over nail and surrounding skin. Apply daily over previous coat. After seven (7) days, may remove with alcohol and continue cycle. Patient not taking: Reported on 10/09/2017 03/31/15   Alycia Rossetti, MD  Diphenhyd-Hydrocort-Nystatin (FIRST-DUKES MOUTHWASH) SUSP Swish and swallow three times a day for sore throat Patient not taking: Reported on 10/09/2017 07/14/15   Alycia Rossetti, MD  hydrochlorothiazide (HYDRODIURIL) 25 MG tablet  Take 1 tablet (25 mg total) by mouth daily. Patient not taking: Reported on 10/09/2017 02/24/15   Alycia Rossetti, MD  metFORMIN (GLUCOPHAGE) 500 MG tablet TAKE ONE TABLET BY MOUTH 2 TIMES A DAY. Patient not taking: Reported  on 10/09/2017 12/08/15   Susy Frizzle, MD  metoprolol (LOPRESSOR) 50 MG tablet TAKE ONE TABLET BY MOUTH 2 TIMES A DAY. Patient not taking: Reported on 10/09/2017 02/24/15   Alycia Rossetti, MD   Allergies  Allergen Reactions  . Penicillins Anaphylaxis    FAMILY HISTORY:  family history includes Breast cancer in her paternal aunt; Heart attack in her father; Other in her mother. SOCIAL HISTORY:  reports that  has never smoked. she has never used smokeless tobacco. She reports that she does not drink alcohol or use drugs.  REVIEW OF SYSTEMS: Positives in BOLD  Constitutional: Negative for fever, chills, weight loss, malaise/fatigue and diaphoresis.  HENT: Negative for hearing loss, ear pain, nosebleeds, congestion, sore throat, neck pain, tinnitus and ear discharge.   Eyes: Negative for blurred vision, double vision, photophobia, pain, discharge and redness.  Respiratory: Negative for cough, hemoptysis, sputum production, shortness of breath, wheezing and stridor.   Cardiovascular: Negative for chest pain, palpitations, orthopnea, claudication, leg swelling and PND.  Gastrointestinal: Negative for heartburn, nausea, vomiting, abdominal pain, diarrhea, constipation, blood in stool and melena.  Genitourinary: Negative for dysuria, urgency, frequency, hematuria and flank pain.  Musculoskeletal: Negative for myalgias, back pain, joint pain and falls.  Skin: Negative for itching and rash.  Neurological: Negative for dizziness, tingling, tremors, sensory change, speech change, focal weakness, seizures, loss of consciousness, weakness and headaches.  Endo/Heme/Allergies: Negative for environmental allergies and polydipsia. Does not bruise/bleed easily.  SUBJECTIVE:  No complaints at this time   VITAL SIGNS: Temp:  [97.9 F (36.6 C)-100.6 F (38.1 C)] 98.6 F (37 C) (02/07 1600) Pulse Rate:  [58-79] 69 (02/07 1800) Resp:  [13-26] 19 (02/07 1800) BP: (122-196)/(46-92) 158/51 (02/07  1800) SpO2:  [94 %-99 %] 94 % (02/07 1800) Weight:  [68.3 kg (150 lb 9.2 oz)] 68.3 kg (150 lb 9.2 oz) (02/07 1438)  PHYSICAL EXAMINATION: General: well developed, well nourished female, NAD  Neuro: alert and oriented, follows commands  HEENT: supple, no JVD  Cardiovascular: nsr, s1s2, no M/R/G Lungs: clear throughout, even, non labored  Abdomen: +BS x4, soft, non tender, non distended  Musculoskeletal: normal bulk and tone, no edema  Skin: intact no rashes or lesions   Recent Labs  Lab 10/09/17 1405 10/10/17 0412 10/11/17 0346  NA 141 140 141  K 3.0* 3.2* 3.4*  CL 106 108 109  CO2 24 23 23   BUN 37* 40* 47*  CREATININE 2.58* 2.72* 3.10*  GLUCOSE 245* 222* 181*   Recent Labs  Lab 10/09/17 1405 10/10/17 0412  HGB 10.8* 10.1*  HCT 32.3* 30.5*  WBC 8.7 9.4  PLT 299 282   US Renal Artery Duplex Complete  Result Date: 10/11/2017 CLINICAL DATA:  77 year old female with hypertension EXAM: RENAL/URINARY TRACT ULTRASOUND RENAL DUPLEX DOPPLER ULTRASOUND COMPARISON:  Prior renal ultrasound 06/28/2017 FINDINGS: Right Kidney: Length: 10 cm. Diffusely echogenic renal parenchyma with increased corticomedullary differentiation. Mild renal cortical thinning. No hydronephrosis or solid mass. Exophytic sonographically simple cyst arising from the lower pole measures 2.9 x 3.8 x 3.1 cm. Left Kidney: Length: 9.8 cm. Diffusely echogenic renal parenchyma with increased corticomedullary differentiation. Mild renal cortical thinning. No hydronephrosis or solid mass. Sonographically simple interpolar cyst measures 1.4 x 1.5 x 1.6 cm.  Bladder:  Within normal limits RENAL DUPLEX ULTRASOUND Right Renal Artery Velocities: Origin:  145 cm/sec Mid:  85 cm/sec [place]:  163 cm/sec Interlobar:  28 cm/sec Arcuate:  27 cm/sec Left Renal Artery Velocities: Origin:  172 cm/sec Mid:  139 cm/sec [place]:  62 cm/sec Interlobar:  23 cm/sec Arcuate:  24 cm/sec Aortic Velocity:  136 cm/sec Right Renal-Aortic Ratios: Origin: 1.1  Mid:  0.6 [place]: 1.2 Interlobar: 0.2 Arcuate: 0.2 Left Renal-Aortic Ratios: Origin: 1.3 Mid: 1.0 Hilum: 0.5 Interlobar: 0.2 Arcuate: 0.2 IMPRESSION: 1. No evidence of hemodynamically significant renal artery stenosis. 2. Echogenic renal parenchyma bilaterally consistent with medical renal disease. 3. Bilateral simple renal cysts. Signed, Criselda Peaches, MD [facility] Electronically Signed   By: Jacqulynn Cadet M.D.   On: 10/11/2017 13:09    ASSESSMENT / PLAN: Accelerated Malignant HTN  Acute on chronic renal failure (baseline Cr 1.5, GFR 34) Nephrotic Range Proteinuria  Anemia  Hx: Diabetes Mellitus, Hyperlipidemia, and GERD   P: Continue nicardipine gtt for systolic bp goal 001 or less Continue po cardiac medications  Continuous telemetry monitoring  Trend BMP  Replace electrolytes as indicated  SCD's for VTE prophylaxis  Trend CBC  Monitor for s/sx of bleeding and transfuse for hgb <7  CBG's ac/hs, SSI, and lantus   Marda Stalker, [place] Pager [phone] (please enter 7 digits) PCCM Consult Pager [phone] (please enter 7 digits)

## 2017-10-11 NOTE — Progress Notes (Signed)
[facility], [place] 10/11/17  Subjective:   Patient is known to our practice from outpatient.  She has diabetes, hypertension, hyperlipidemia and history of kidney stones in the past For the past several weeks to months, she has been dealing with edema.  She has nephrotic range proteinuria.  Her serologies including hepatitis B and C, ANCA antibodies, ANA, SPEP and UPEP are negative Her serum creatinine has been steadily increasing from a baseline of 1.5/GFR 34 back in August 2018 to 1.8 in October, 1.96 in January and most recently up to 2.38 on February 1 and now to 3.10 Blood pressure has been poorly controlled. patient is admitted for control of blood pressure, evaluation of acute renal failure and Possible kidney biopsy She feels well today No leg edema   Objective:  Vital signs in last 24 hours:  Temp:  [98 F (36.7 C)-102.1 F (38.9 C)] 98.5 F (36.9 C) (02/07 0900) Pulse Rate:  [59-75] 75 (02/07 0900) Resp:  [14-24] 18 (02/07 0900) BP: (159-193)/(46-76) 182/53 (02/07 0900) SpO2:  [95 %-100 %] 97 % (02/07 0900)  Weight change:  Filed Weights   10/09/17 1245  Weight: 68.4 kg (150 lb 12.7 oz)    Intake/Output:    Intake/Output Summary (Last 24 hours) at 10/11/2017 0913 Last data filed at 10/10/2017 2200 Gross per 24 hour  Intake 240 ml  Output 800 ml  Net -560 ml     Physical Exam: General:  Well-appearing, laying in the bed  HEENT  anicteric, moist oral mucous membranes  Neck  supple  Pulm/lungs  clear to auscultation bilaterally  CVS/Heart  regular rate and rhythm, no rub or gallop  Abdomen:   Soft, nontender  Extremities:  no Edema  Neurologic:  Alert, oriented  Skin:  No acute rashes          Basic Metabolic Panel:  Recent Labs  Lab 10/09/17 1405 10/10/17 0412 10/11/17 0346  NA 141 140 141  K 3.0* 3.2* 3.4*  CL 106 108 109  CO2 24 23 23   GLUCOSE 245* 222* 181*  BUN 37* 40* 47*  CREATININE 2.58* 2.72* 3.10*   CALCIUM 8.8* 8.4* 8.5*     CBC: Recent Labs  Lab 10/09/17 1405 10/10/17 0412  WBC 8.7 9.4  HGB 10.8* 10.1*  HCT 32.3* 30.5*  MCV 79.2* 80.3  PLT 299 282     No results found for: HEPBSAG, HEPBSAB, HEPBIGM    Microbiology:  No results found for this or any previous visit (from the past 240 hour(s)).  Coagulation Studies: Recent Labs    10/09/17 1405 10/10/17 0412  LABPROT 12.7 11.7  INR 0.96 0.87    Urinalysis: No results for input(s): COLORURINE, LABSPEC, PHURINE, GLUCOSEU, HGBUR, BILIRUBINUR, KETONESUR, PROTEINUR, UROBILINOGEN, NITRITE, LEUKOCYTESUR in the last 72 hours.  Invalid input(s): APPERANCEUR    Imaging: No results found.   Medications:   . sodium chloride     . amLODipine  10 mg Oral BID  . atorvastatin  10 mg Oral q morning - 10a  . benazepril  20 mg Oral BID  . hydrALAZINE  10 mg Intravenous Once  . hydrALAZINE  100 mg Oral Q8H  . insulin aspart  0-5 Units Subcutaneous QHS  . insulin aspart  0-9 Units Subcutaneous TID WC  . insulin glargine  15 Units Subcutaneous QHS  . metoprolol succinate  100 mg Oral Daily  . nitroGLYCERIN  0.5 inch Topical Q6H   acetaminophen **OR** acetaminophen, hydrALAZINE  Assessment/ Plan:  77  y.o. caucasian female With diabetes, hypertension, hyperlipidemia, history of kidney stones  1.  Acute renal failure 2.  Chronic kidney disease stage III, baseline Cr 1.5, GFR 34 3.  Type 2 diabetes with chronic kidney disease 4.  Nephrotic range proteinuria, 9.1 gm by P/C ratio in Jan 2019 5.  Hematuria 6.  Severe hypertension 4.  Lower extremity edema 8.  Hyperlipidemia 9.  Secondary hyperparathyroidism  Plan: Resume home blood pressure medications IV hydralazine as needed to control blood pressure Current hypertensive regimen includes amlodipine 10 mg twice a day, benazepril 20 twice a day, hydralazine 100 mg every 8 hours, metoprolol 100 mg daily   - Nitropaste added this morning - Hold torsemide -  Clonidine if BP > 160 - N.p.o. after midnight for possible biopsy tomorrow if blood pressure is controlled    LOS: 2 [place] Candiss Norse 2/7/20199:13 [place], [place]

## 2017-10-11 NOTE — Progress Notes (Signed)
Drs Candiss Norse and Benjie Karvonen informed of SBP 122, Nicardipine gtt not started, continue to monitor, start gtt if SBP > 160.  Other than that give oral meds as ordered, In AM give all BP meds at 0800 so patient may have renal biopsy done.  Feed her for today, NPO after MN

## 2017-10-11 NOTE — Progress Notes (Signed)
Patient to be transferred to ICU on nicardipine drip due to elevated blood pressure. Dr. Candiss Norse spoke with intensivist.

## 2017-10-11 NOTE — Progress Notes (Signed)
[facility] [place] NAME: Catherine Burke    MR#:  [id number]  DATE OF BIRTH:  1941-06-23  SUBJECTIVE:   Patient had good  BP throughout day this am noted to be elevated but did nto receive HTN meds yet No complaints overnight   REVIEW OF SYSTEMS:    Review of Systems  Constitutional: Negative for fever, chills weight loss HENT: Negative for ear pain, nosebleeds, congestion, facial swelling, rhinorrhea, neck pain, neck stiffness and ear discharge.   Respiratory: Negative for cough, shortness of breath, wheezing  Cardiovascular: Negative for chest pain, palpitations and leg swelling.  Gastrointestinal: Negative for heartburn, abdominal pain, vomiting, diarrhea or consitpation Genitourinary: Negative for dysuria, urgency, frequency, hematuria Musculoskeletal: Negative for back pain or joint pain Neurological: Negative for dizziness, seizures, syncope, focal weakness,  numbness and headaches.  Hematological: Does not bruise/bleed easily.  Psychiatric/Behavioral: Negative for hallucinations, confusion, dysphoric mood    Tolerating Diet: yes      DRUG ALLERGIES:   Allergies  Allergen Reactions  . Penicillins Anaphylaxis    VITALS:  Blood pressure (!) 182/53, pulse 75, temperature 98.5 F (36.9 C), temperature source Oral, resp. rate 18, height 5\' 4"  (1.626 m), weight 68.4 kg (150 lb 12.7 oz), SpO2 97 %.  PHYSICAL EXAMINATION:  Constitutional: Appears well-developed and well-nourished. No distress. HENT: Normocephalic. Marland Kitchen Oropharynx is clear and moist.  Eyes: Conjunctivae and EOM are normal. PERRLA, no scleral icterus.  Neck: Normal ROM. Neck supple. No JVD. No tracheal deviation. CVS: RRR, S1/S2 +,2/6 SEM no gallops, no carotid bruit.  Pulmonary: Effort and breath sounds normal, no stridor, rhonchi, wheezes, rales.  Abdominal: Soft. BS +,  no distension, tenderness, rebound or guarding.  Musculoskeletal: Normal range of motion. No  edema and no tenderness.  Neuro: Alert. CN 2-12 grossly intact. No focal deficits. Skin: Skin is warm and dry. No rash noted. Psychiatric: Normal mood and affect.      LABORATORY PANEL:   CBC Recent Labs  Lab 10/10/17 0412  WBC 9.4  HGB 10.1*  HCT 30.5*  PLT 282   ------------------------------------------------------------------------------------------------------------------  Chemistries  Recent Labs  Lab 10/10/17 0412 10/11/17 0346  NA 140 141  K 3.2* 3.4*  CL 108 109  CO2 23 23  GLUCOSE 222* 181*  BUN 40* 47*  CREATININE 2.72* 3.10*  CALCIUM 8.4* 8.5*  AST 13*  --   ALT 11*  --   ALKPHOS 69  --   BILITOT 0.6  --    ------------------------------------------------------------------------------------------------------------------  Cardiac Enzymes No results for input(s): TROPONINI in the last 168 hours. ------------------------------------------------------------------------------------------------------------------  RADIOLOGY:  No results found.   ASSESSMENT AND PLAN:   77 year old female with history of chronic kidney disease stage III, hyperlipidemia and diabetes who presents due to elevated blood pressure.   1. Accelerated essential hypertension: Continue hydralazine, Norvasc and metoprolol for better blood pressure control. Continue Benzapril 20 mg daily NTG added this am by Dr Candiss Norse   2. 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 Type 2 diabetes: Hemoglobin A1c 7.6  Continue sliding scale and Lantus  4. Hyperlipidemia: Continue statin   Management plans discussed with the patient and she is in agreement.  CODE STATUS: Full  TOTAL TIME TAKING CARE OF THIS PATIENT: 28 minutes.   Case discussed with Dr. Candiss Norse  POSSIBLE D/C 1-2 days, DEPENDING ON CLINICAL CONDITION.  Xavier Fournier M.D  on 10/11/2017 at 9:14 AM  Between 7am to 6pm - Pager - [phone] After 6pm go to [email or web address] - password [email or web address] [facility]  Office  [phone]  CC: Primary care physician; Idelle Crouch, MD  Note: This dictation was prepared with Dragon dictation along with smaller phrase technology. Any transcriptional errors that result from this process are unintentional.

## 2017-10-11 NOTE — Progress Notes (Signed)
Inpatient Diabetes Program Recommendations  AACE/ADA: New Consensus Statement on Inpatient Glycemic Control (2015)  Target Ranges:  Prepandial:   less than 140 mg/dL      Peak postprandial:   less than 180 mg/dL (1-2 hours)      Critically ill patients:  140 - 180 mg/dL   Lab Results  Component Value Date   GLUCAP 189 (H) 10/11/2017   HGBA1C 7.6 (H) 10/09/2017    Review of Glycemic Control  Results for Catherine Burke, Catherine Burke (MRN [id number]) as of 10/11/2017 09:39  Ref. Range 10/10/2017 10:59 10/10/2017 16:36 10/10/2017 21:01 10/10/2017 22:34 10/11/2017 07:53  Glucose-Capillary Latest Ref Range: 65 - 99 mg/dL 284 (H) 184 (H) 225 (H) 213 (H) 189 (H)    Diabetes history: Type 2 DM  Outpatient Diabetes medications: Lantus 20 units q HS  Current orders for Inpatient glycemic control: Lantus 15 units q HS, Novolog sensitive tid with meals and HS, Novolog 3 units tid with meals  Inpatient Diabetes Program Recommendations:  Please increase Lantus to 20 units qhs- Dr. Benjie Karvonen text paged.  Gentry Fitz, RN, BA, MHA, CDE Diabetes Coordinator Inpatient Diabetes Program  [phone] (Team Pager) [phone] ([facility]) 10/11/2017 9:40 AM

## 2017-10-11 NOTE — Plan of Care (Signed)
Pt tx to ICU, she & family oriented to unit.  Currently continent using bedpan.  No pain issues

## 2017-10-11 NOTE — Progress Notes (Signed)
Pt transferred to the ICU

## 2017-10-11 NOTE — Progress Notes (Signed)
Pt's MRSA swab +, isolation and tx orders initiated Nicardipene gtt initiated @ 3 mg/hr d/t BP accelerated to systolic of 203.  Encouraged patient to use bedpan until we stabilized on the gtt, she was agreeable.  Family at bedside; they were educated re MRSA tx and precautions with handout.  Understanding verbalized

## 2017-10-12 ENCOUNTER — Inpatient Hospital Stay: Payer: Medicare Other

## 2017-10-12 DIAGNOSIS — I160 Hypertensive urgency: Secondary | ICD-10-CM

## 2017-10-12 LAB — BASIC METABOLIC PANEL
Anion gap: 8 (ref 5–15)
BUN: 45 mg/dL — AB (ref 6–20)
CALCIUM: 8.2 mg/dL — AB (ref 8.9–10.3)
CO2: 23 mmol/L (ref 22–32)
CREATININE: 3.17 mg/dL — AB (ref 0.44–1.00)
Chloride: 112 mmol/L — ABNORMAL HIGH (ref 101–111)
GFR calc Af Amer: 15 mL/min — ABNORMAL LOW (ref >60)
GFR, EST NON AFRICAN AMERICAN: 13 mL/min — AB (ref >60)
GLUCOSE: 174 mg/dL — AB (ref 65–99)
Potassium: 3.3 mmol/L — ABNORMAL LOW (ref 3.5–5.1)
SODIUM: 143 mmol/L (ref 135–145)

## 2017-10-12 LAB — GLUCOSE, CAPILLARY
GLUCOSE-CAPILLARY: 157 mg/dL — AB (ref 65–99)
GLUCOSE-CAPILLARY: 151 mg/dL — AB (ref 65–99)
GLUCOSE-CAPILLARY: 166 mg/dL — AB (ref 65–99)
Glucose-Capillary: 99 mg/dL (ref 65–99)

## 2017-10-12 LAB — HEMOGLOBIN: HEMOGLOBIN: 10.2 g/dL — AB (ref 12.0–16.0)

## 2017-10-12 MED ORDER — NIFEDIPINE ER 60 MG PO TB24
60.0000 mg | ORAL_TABLET | Freq: Every day | ORAL | Status: DC
  Administered 2017-10-12: 60 mg via ORAL
  Filled 2017-10-12: qty 1

## 2017-10-12 MED ORDER — POTASSIUM CHLORIDE CRYS ER 20 MEQ PO TBCR
40.0000 meq | EXTENDED_RELEASE_TABLET | Freq: Once | ORAL | Status: AC
  Administered 2017-10-12: 40 meq via ORAL
  Filled 2017-10-12: qty 2

## 2017-10-12 MED ORDER — SPIRONOLACTONE 25 MG PO TABS
25.0000 mg | ORAL_TABLET | Freq: Every day | ORAL | Status: DC
  Administered 2017-10-12 – 2017-10-13 (×2): 25 mg via ORAL
  Filled 2017-10-12 (×2): qty 1

## 2017-10-12 NOTE — Procedures (Signed)
PROCEDURE: Informed written consent was obtained from the patient after a discussion of the risks, benefits and alternatives to treatment. The patient understands and consents the procedure. A timeout was performed prior to the initiation of the procedure.  Ultrasound scanning was performed of the bilateral flanks. The inferior pole of the RIGHT kidney was selected for biopsy due to location and sonographic window. The procedure was planned. The operative site was prepped and draped in the usual sterile fashion. The overlying soft tissues were anesthetized with 10 mL of 1% XYLOCAINE.  A 18 gauge core needle biopsy device was advanced into the inferior cortex of the RIGHT kidney and 3 core biopsies were obtained under direct ultrasound guidance. Real time pathologic review confirmed adequate tissue acquisition. Images were saved for documentation purposes. The biopsy device was removed and hemostasis was obtained with manual compression. Post procedural scanning was negative for significant post procedural hemorrhage or additional complication. A dressing was placed. The patient tolerated the procedure well without immediate post procedural complication.

## 2017-10-12 NOTE — Progress Notes (Signed)
[facility] [place] NAME: Catherine Burke    MR#:  [id number]  DATE OF BIRTH:  [birth year]  SUBJECTIVE:   Patient transferred to the ICU due to elevated blood pressure. She was placed on nicardipine drip in addition to her all hypertensive medications in order to obtain kidney biopsy. She received kidney biopsy this morning. She is being weaned off nicardipine drip. She has no complaints of shortness of breath, lower extremity edema or chest pain this morning.   REVIEW OF SYSTEMS:    Review of Systems  Constitutional: Negative for fever, chills weight loss HENT: Negative for ear pain, nosebleeds, congestion, facial swelling, rhinorrhea, neck pain, neck stiffness and ear discharge.   Respiratory: Negative for cough, shortness of breath, wheezing  Cardiovascular: Negative for chest pain, palpitations and leg swelling.  Gastrointestinal: Negative for heartburn, abdominal pain, vomiting, diarrhea or consitpation Genitourinary: Negative for dysuria, urgency, frequency, hematuria Musculoskeletal: Negative for back pain or joint pain Neurological: Negative for dizziness, seizures, syncope, focal weakness,  numbness and headaches.  Hematological: Does not bruise/bleed easily.  Psychiatric/Behavioral: Negative for hallucinations, confusion, dysphoric mood    Tolerating Diet: yes      DRUG ALLERGIES:   Allergies  Allergen Reactions  . Penicillins Anaphylaxis    VITALS:  Blood pressure (!) 163/62, pulse 63, temperature 98.6 F (37 C), temperature source Oral, resp. rate (!) 22, height 5\' 4"  (1.626 m), weight 68.3 kg (150 lb 9.2 oz), SpO2 95 %.  PHYSICAL EXAMINATION:  Constitutional: Appears well-developed and well-nourished. No distress. HENT: Normocephalic. Marland Kitchen Oropharynx is clear and moist.  Eyes: Conjunctivae and EOM are normal. PERRLA, no scleral icterus.  Neck: Normal ROM. Neck supple. No JVD. No tracheal deviation. CVS: RRR, S1/S2 +,2/6  SEM no gallops, no carotid bruit.  Pulmonary: Effort and breath sounds normal, no stridor, rhonchi, wheezes, rales.  Abdominal: Soft. BS +,  no distension, tenderness, rebound or guarding.  Musculoskeletal: Normal range of motion. No edema and no tenderness.  Neuro: Alert. CN 2-12 grossly intact. No focal deficits. Skin: Skin is warm and dry. No rash noted. Psychiatric: Normal mood and affect.      LABORATORY PANEL:   CBC Recent Labs  Lab 10/10/17 0412  WBC 9.4  HGB 10.1*  HCT 30.5*  PLT 282   ------------------------------------------------------------------------------------------------------------------  Chemistries  Recent Labs  Lab 10/10/17 0412 10/11/17 0346 10/12/17 0517  NA 140 141 143  K 3.2* 3.4* 3.3*  CL 108 109 112*  CO2 23 23 23   GLUCOSE 222* 181* 174*  BUN 40* 47* 45*  CREATININE 2.72* 3.10* 3.17*  CALCIUM 8.4* 8.5* 8.2*  MG  --  1.9  --   AST 13*  --   --   ALT 11*  --   --   ALKPHOS 69  --   --   BILITOT 0.6  --   --    ------------------------------------------------------------------------------------------------------------------  Cardiac Enzymes No results for input(s): TROPONINI in the last 168 hours. ------------------------------------------------------------------------------------------------------------------  RADIOLOGY:  US Biopsy-no Radiologist  Result Date: 10/12/2017 Murlean Iba, MD     10/12/2017 10:32 AM PROCEDURE: Informed written consent was obtained from the patient after a discussion of the risks, benefits and alternatives to treatment. The patient understands and consents the procedure. A timeout was performed prior to the initiation of the procedure.  Ultrasound scanning was performed of the bilateral flanks. The inferior pole of the RIGHT kidney was selected for biopsy due to location and sonographic window. The procedure  was planned. The operative site was prepped and draped in the usual sterile fashion. The overlying soft  tissues were anesthetized with 10 mL of 1% XYLOCAINE.  A 18 gauge core needle biopsy device was advanced into the inferior cortex of the RIGHT kidney and 3 core biopsies were obtained under direct ultrasound guidance. Real time pathologic review confirmed adequate tissue acquisition. Images were saved for documentation purposes. The biopsy device was removed and hemostasis was obtained with manual compression. Post procedural scanning was negative for significant post procedural hemorrhage or additional complication. A dressing was placed. The patient tolerated the procedure well without immediate post procedural complication.   US Renal Artery Duplex Complete  Result Date: 10/11/2017 CLINICAL DATA:  77 year old female with hypertension EXAM: RENAL/URINARY TRACT ULTRASOUND RENAL DUPLEX DOPPLER ULTRASOUND COMPARISON:  Prior renal ultrasound 06/28/2017 FINDINGS: Right Kidney: Length: 10 cm. Diffusely echogenic renal parenchyma with increased corticomedullary differentiation. Mild renal cortical thinning. No hydronephrosis or solid mass. Exophytic sonographically simple cyst arising from the lower pole measures 2.9 x 3.8 x 3.1 cm. Left Kidney: Length: 9.8 cm. Diffusely echogenic renal parenchyma with increased corticomedullary differentiation. Mild renal cortical thinning. No hydronephrosis or solid mass. Sonographically simple interpolar cyst measures 1.4 x 1.5 x 1.6 cm. Bladder:  Within normal limits RENAL DUPLEX ULTRASOUND Right Renal Artery Velocities: Origin:  145 cm/sec Mid:  85 cm/sec Hilum:  163 cm/sec Interlobar:  28 cm/sec Arcuate:  27 cm/sec Left Renal Artery Velocities: Origin:  172 cm/sec Mid:  139 cm/sec Hilum:  62 cm/sec Interlobar:  23 cm/sec Arcuate:  24 cm/sec Aortic Velocity:  136 cm/sec Right Renal-Aortic Ratios: Origin: 1.1 Mid:  0.6 Hilum: 1.2 Interlobar: 0.2 Arcuate: 0.2 Left Renal-Aortic Ratios: Origin: 1.3 Mid: 1.0 Hilum: 0.5 Interlobar: 0.2 Arcuate: 0.2 IMPRESSION: 1. No evidence of  hemodynamically significant renal artery stenosis. 2. Echogenic renal parenchyma bilaterally consistent with medical renal disease. 3. Bilateral simple renal cysts. Signed, Criselda Peaches, MD [facility] Electronically Signed   By: Jacqulynn Cadet M.D.   On: 10/11/2017 13:09     ASSESSMENT AND PLAN:   77 year old female with history of chronic kidney disease stage III, hyperlipidemia and diabetes who presents due to elevated blood pressure.   1. Accelerated essential hypertension: Plan to wean off of nicardipine drip today Continue hydralazine, Norvasc, Benzapril and metoprolol   2. 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 She will follow up with nephrology as an outpatient. 3. Type 2 diabetes: Hemoglobin A1c 7.6  Continue sliding scale and Lantus  4. Hyperlipidemia: Continue statin    Management plans discussed with the patient and son and she is in agreement.  CODE STATUS: Full  TOTAL TIME TAKING CARE OF THIS PATIENT: 25 minutes.   Case discussed with Dr. Candiss Norse and intensivist    POSSIBLE D/C tomorrow to home, DEPENDING ON blood pressure  Lamanda Rudder M.D on 10/12/2017 at 11:41 AM  Between 7am to 6pm - Pager - [phone] After 6pm go to [email or web address] - password [email or web address] [facility]  Office  [phone]  CC: Primary care physician; Idelle Crouch, MD  Note: This dictation was prepared with Dragon dictation along with smaller phrase technology. Any transcriptional errors that result from this process are unintentional.

## 2017-10-12 NOTE — Progress Notes (Signed)
[facility], [place] 10/12/17  Subjective:   Patient is known to our practice from outpatient.  She has diabetes, hypertension, hyperlipidemia and history of kidney stones in the past For the past several weeks to months, she has been dealing with edema.  She has nephrotic range proteinuria.  Her serologies including hepatitis B and C, ANCA antibodies, ANA, SPEP and UPEP are negative Her serum creatinine has been steadily increasing from a baseline of 1.5/GFR 34 back in August 2018 to 1.8 in October, 1.96 in January and most recently up to 2.38 on February 1 and now to 3.17 Patient had to be transferred to ICU last evening for IV nicardipine drip to control blood pressure Her systolic blood pressure overnight has been in the range of 120-160s This morning, she feels well.  No Edema.  No shortness of breath   Objective:  Vital signs in last 24 hours:  Temp:  [97.9 F (36.6 C)-98.9 F (37.2 C)] 98.6 F (37 C) (02/08 0100) Pulse Rate:  [58-79] 78 (02/08 0812) Resp:  [13-26] 15 (02/08 0600) BP: (122-196)/(39-92) 161/51 (02/08 0812) SpO2:  [89 %-99 %] 92 % (02/08 0600) Weight:  [68.3 kg (150 lb 9.2 oz)] 68.3 kg (150 lb 9.2 oz) (02/07 1438)  Weight change:  Filed Weights   10/09/17 1245 10/11/17 1438  Weight: 68.4 kg (150 lb 12.7 oz) 68.3 kg (150 lb 9.2 oz)    Intake/Output:    Intake/Output Summary (Last 24 hours) at 10/12/2017 0844 Last data filed at 10/12/2017 0600 Gross per 24 hour  Intake 1069.67 ml  Output -  Net 1069.67 ml     Physical Exam: General:  Well-appearing, laying in the bed  HEENT  anicteric, moist oral mucous membranes  Neck  supple  Pulm/lungs  clear to auscultation bilaterally  CVS/Heart  regular rate and rhythm, no rub or gallop  Abdomen:   Soft, nontender  Extremities:  no Edema  Neurologic:  Alert, oriented  Skin:  No acute rashes          Basic Metabolic Panel:  Recent Labs  Lab 10/09/17 1405 10/10/17 0412  10/11/17 0346 10/12/17 0517  NA 141 140 141 143  K 3.0* 3.2* 3.4* 3.3*  CL 106 108 109 112*  CO2 24 23 23 23   GLUCOSE 245* 222* 181* 174*  BUN 37* 40* 47* 45*  CREATININE 2.58* 2.72* 3.10* 3.17*  CALCIUM 8.8* 8.4* 8.5* 8.2*  MG  --   --  1.9  --      CBC: Recent Labs  Lab 10/09/17 1405 10/10/17 0412  WBC 8.7 9.4  HGB 10.8* 10.1*  HCT 32.3* 30.5*  MCV 79.2* 80.3  PLT 299 282     No results found for: HEPBSAG, HEPBSAB, HEPBIGM    Microbiology:  Recent Results (from the past 240 hour(s))  MRSA PCR Screening     Status: Abnormal   Collection Time: 10/11/17  2:35 PM  Result Value Ref Range Status   MRSA by PCR POSITIVE (A) NEGATIVE Final    Comment:        The GeneXpert MRSA Assay (FDA approved for NASAL specimens only), is one component of a comprehensive MRSA colonization surveillance program. It is not intended to diagnose MRSA infection nor to guide or monitor treatment for MRSA infections. RESULT CALLED TO, READ BACK BY AND VERIFIED WITH: SHERRELL GREEN AT 9937 ON 10/11/2017 JJB Performed at [facility], [address] [place], [place] [address]     Coagulation Studies:  Recent Labs    10/09/17 1405 10/10/17 0412  LABPROT 12.7 11.7  INR 0.96 0.87    Urinalysis: No results for input(s): COLORURINE, LABSPEC, PHURINE, GLUCOSEU, HGBUR, BILIRUBINUR, KETONESUR, PROTEINUR, UROBILINOGEN, NITRITE, LEUKOCYTESUR in the last 72 hours.  Invalid input(s): APPERANCEUR    Imaging: US Renal Artery Duplex Complete  Result Date: 10/11/2017 CLINICAL DATA:  77 year old female with hypertension EXAM: RENAL/URINARY TRACT ULTRASOUND RENAL DUPLEX DOPPLER ULTRASOUND COMPARISON:  Prior renal ultrasound 06/28/2017 FINDINGS: Right Kidney: Length: 10 cm. Diffusely echogenic renal parenchyma with increased corticomedullary differentiation. Mild renal cortical thinning. No hydronephrosis or solid mass. Exophytic sonographically simple cyst arising from the lower pole  measures 2.9 x 3.8 x 3.1 cm. Left Kidney: Length: 9.8 cm. Diffusely echogenic renal parenchyma with increased corticomedullary differentiation. Mild renal cortical thinning. No hydronephrosis or solid mass. Sonographically simple interpolar cyst measures 1.4 x 1.5 x 1.6 cm. Bladder:  Within normal limits RENAL DUPLEX ULTRASOUND Right Renal Artery Velocities: Origin:  145 cm/sec Mid:  85 cm/sec Hilum:  163 cm/sec Interlobar:  28 cm/sec Arcuate:  27 cm/sec Left Renal Artery Velocities: Origin:  172 cm/sec Mid:  139 cm/sec Hilum:  62 cm/sec Interlobar:  23 cm/sec Arcuate:  24 cm/sec Aortic Velocity:  136 cm/sec Right Renal-Aortic Ratios: Origin: 1.1 Mid:  0.6 Hilum: 1.2 Interlobar: 0.2 Arcuate: 0.2 Left Renal-Aortic Ratios: Origin: 1.3 Mid: 1.0 Hilum: 0.5 Interlobar: 0.2 Arcuate: 0.2 IMPRESSION: 1. No evidence of hemodynamically significant renal artery stenosis. 2. Echogenic renal parenchyma bilaterally consistent with medical renal disease. 3. Bilateral simple renal cysts. Signed, Criselda Peaches, MD [facility] Electronically Signed   By: Jacqulynn Cadet M.D.   On: 10/11/2017 13:09     Medications:   . sodium chloride    . sodium chloride 10 mL/hr at 10/11/17 1800  . niCARDipine 3 mg/hr (10/12/17 0504)   . atorvastatin  10 mg Oral q morning - 10a  . benazepril  20 mg Oral BID  . Chlorhexidine Gluconate Cloth  6 each Topical Q0600  . cloNIDine  0.1 mg Oral Once  . hydrALAZINE  10 mg Intravenous Once  . hydrALAZINE  100 mg Oral Q8H  . insulin aspart  0-5 Units Subcutaneous QHS  . insulin aspart  0-9 Units Subcutaneous TID WC  . insulin aspart  3 Units Subcutaneous TID WC  . insulin glargine  20 Units Subcutaneous QHS  . metoprolol succinate  100 mg Oral Daily  . mupirocin ointment  1 application Nasal BID   acetaminophen **OR** acetaminophen, hydrALAZINE  Assessment/ Plan:  77 y.o. caucasian female With diabetes, hypertension,  hyperlipidemia, history of kidney stones  1.  Acute renal failure 2.  Chronic kidney disease stage III, baseline Cr 1.5, GFR 34 3.  Type 2 diabetes with chronic kidney disease 4.  Nephrotic range proteinuria, 9.1 gm by P/C ratio in Jan 2019 5.  Hematuria 6.  Severe hypertension 4.  Lower extremity edema 8.  Hyperlipidemia 9.  Secondary hyperparathyroidism  Plan: Resume home blood pressure medications IV hydralazine as needed to control blood pressure Current hypertensive regimen includes benazepril 20 twice a day, hydralazine 100 mg every 8 hours, metoprolol 100 mg daily   -Nicardipine drip - Nitropaste 0.5" - Hold torsemide - Clonidine if BP > 160 -Plan for kidney biopsy today    LOS: [place] 2/8/20198:44 [place], [place]

## 2017-10-13 LAB — BASIC METABOLIC PANEL
Anion gap: 9 (ref 5–15)
BUN: 44 mg/dL — AB (ref 6–20)
CALCIUM: 8.2 mg/dL — AB (ref 8.9–10.3)
CO2: 20 mmol/L — ABNORMAL LOW (ref 22–32)
CREATININE: 3.12 mg/dL — AB (ref 0.44–1.00)
Chloride: 113 mmol/L — ABNORMAL HIGH (ref 101–111)
GFR calc Af Amer: 15 mL/min — ABNORMAL LOW (ref >60)
GFR, EST NON AFRICAN AMERICAN: 13 mL/min — AB (ref >60)
GLUCOSE: 115 mg/dL — AB (ref 65–99)
Potassium: 3.6 mmol/L (ref 3.5–5.1)
SODIUM: 142 mmol/L (ref 135–145)

## 2017-10-13 LAB — MAGNESIUM: Magnesium: 2.1 mg/dL (ref 1.7–2.4)

## 2017-10-13 LAB — GLUCOSE, CAPILLARY
GLUCOSE-CAPILLARY: 111 mg/dL — AB (ref 65–99)
Glucose-Capillary: 108 mg/dL — ABNORMAL HIGH (ref 65–99)
Glucose-Capillary: 130 mg/dL — ABNORMAL HIGH (ref 65–99)

## 2017-10-13 LAB — PHOSPHORUS: Phosphorus: 4.6 mg/dL (ref 2.5–4.6)

## 2017-10-13 MED ORDER — NIFEDIPINE ER 60 MG PO TB24: 60.0000 mg | ORAL_TABLET | Freq: Every day | ORAL | 1 refills | Status: DC

## 2017-10-13 MED ORDER — AMIODARONE HCL IN DEXTROSE 360-4.14 MG/200ML-% IV SOLN
INTRAVENOUS | Status: AC
  Filled 2017-10-13: qty 200

## 2017-10-13 MED ORDER — SPIRONOLACTONE 25 MG PO TABS: 25.0000 mg | ORAL_TABLET | Freq: Every day | ORAL | 1 refills | Status: AC

## 2017-10-13 MED ORDER — BENAZEPRIL HCL 20 MG PO TABS: 20.0000 mg | ORAL_TABLET | Freq: Two times a day (BID) | ORAL | 1 refills | Status: DC

## 2017-10-13 MED ORDER — TORSEMIDE 20 MG PO TABS: 20.0000 mg | ORAL_TABLET | Freq: Every day | ORAL | Status: DC | PRN

## 2017-10-13 MED ORDER — METOPROLOL SUCCINATE ER 100 MG PO TB24: 100.0000 mg | ORAL_TABLET | Freq: Every day | ORAL | 1 refills | Status: DC

## 2017-10-13 MED ORDER — HYDRALAZINE HCL 100 MG PO TABS: 100.0000 mg | ORAL_TABLET | Freq: Three times a day (TID) | ORAL | 1 refills | Status: AC

## 2017-10-13 NOTE — Progress Notes (Signed)
Patient blood pressure more stable, tolerated Lotensin and hydralazine. Was sinus bradycardia while she slept. Remains on room air without respiratory distress.

## 2017-10-13 NOTE — Discharge Summary (Signed)
Physician Discharge Summary  Patient ID: SEMONE ORLOV MRN: [id number] DOB/AGE: 78-Mar-1942 78 y.o.  Admit date: 10/09/2017 Discharge date: 10/13/2017  Admission Diagnoses: Hypertension, renal failure with nephrotic range proteinuria  Discharge Diagnoses:  Active Problems:   Proteinuria   Discharged Condition: Good  Hospital Course: Mrs. Albertina Senegal is a very pleasant 77 year old female who was admitted on 2/5 2019, has a past medical history remarkable for hypertension, hyperlipidemia, hypercholesterolemia, diabetes, gastroesophageal reflux disease, arthritis who is admitted for severe uncontrolled hypertension, renal failure and nephrotic range proteinuria for evaluation of renal biopsy. Nephrology was consults at, had difficulty controlling high blood pressure and did not want to perform renal biopsy wall she was hypertensive. She was subsequently admitted to the intensive care unit where she was started on a nicardipine infusion, had safe reduction in blood pressure and underwent a renal biopsy, pending results. She tolerated her biopsy well yesterday, had no residual discomfort or drop in hemoglobin post biopsy. This morning she is awake, alert and in no acute distress wishing to go home. Her present antihypertensive management includes benazepril 20 mg every 12, clonidine 0.1 mg daily, hydralazine 100 mg every 8, metoprolol 100 mg daily extended release, nifedipine 60 mg daily at bedtime and spironolactone 25 mg daily. She will follow-up with Dr. Candiss Norse for the results of the biopsy and to continue blood pressure control/management.  Consults: nephrology  Significant Diagnostic Studies: Ultrasound guided right kidney needle  aspiration  Treatments: Blood pressure control  Discharge Exam: Blood pressure (!) 127/113, pulse 66, temperature 97.6 F (36.4 C), temperature source Oral, resp. rate 14, height 5\' 4"  (1.626 m), weight 150 lb 9.2 oz (68.3 kg), SpO2 96 %.  VITAL SIGNS: Temp:  [97.6 F  (36.4 C)-98.4 F (36.9 C)] 97.6 F (36.4 C) (02/09 0800) Pulse Rate:  [52-69] 59 (02/09 0800) Resp:  [12-21] 15 (02/09 0800) BP: (98-175)/(40-86) 158/67 (02/09 0800) SpO2:  [91 %-97 %] 94 % (02/09 0800)  PHYSICAL EXAMINATION: Physical Examination:   VS: BP (!) 158/67   Pulse (!) 59   Temp 97.6 F (36.4 C) (Oral)   Resp 15   Ht 5\' 4"  (1.626 m)   Wt 150 lb 9.2 oz (68.3 kg)   SpO2 94%   BMI 25.85 kg/m   General Appearance: No distress  Neuro:without focal findings, mental status normal. HEENT: PERRLA, EOM intact. Pulmonary: normal breath sounds   CardiovascularNormal S1,S2.  No m/r/g.   Abdomen: Benign, Soft, non-tender. Renal:  No costovertebral tenderness  Skin:   warm, no rashes, no ecchymosis  Extremities: normal, no cyanosis, clubbing.  Disposition: 01-Home or Self Care  Hermelinda Dellen, DO  Signed: Lovinia Snare 10/13/2017, 10:06 AM

## 2017-10-13 NOTE — Progress Notes (Signed)
Discharge instructions given with prescriptions. She verbalizes understanding off new medications purpose , time and dosages. She is aware of what meds she has taken today. She can verbalize symptoms to report including any blood in urine, Pain, swelling, signe of infection at biopsy site.. She is aware she will return to DR Candiss Norse office Friday at 2:20. She plans to continue bactroban to nares for a week. Await ride home.

## 2017-10-13 NOTE — Progress Notes (Signed)
Patient doing well s/p kidney biopsy. Offers no complaints. Blood pressure well controlled on oral antihypertensives. Patient si stable for transfer out of the ICU   Tasfia Vasseur S. [facility] ANP-BC Pulmonary and Critical Care Medicine [facility] Pager [phone] or [phone]  NB: This document was prepared using Dragon voice recognition software and may include unintentional dictation errors.

## 2017-10-13 NOTE — Progress Notes (Signed)
[facility] Progess Note    SYNOPSIS   Patient with severe hypertension, was admitted to the intensive care unit for Cardene infusion so that she can undergo a renal biopsy for nephrotic syndrome   ASSESSMENT/PLAN   Nephrotic range proteinuria/renal failure. Patient is doing well status post renal biopsy  Severe hypertension. Present antihypertensive regimen is benazepril 20 mg by mouth every 12, clonidine 0.1 daily, hydralazine 100 mg by mouth every 8, metoprolol 100 mg by mouth daily, nifedipine 60 mg by mouth daily at bedtime, spironolactone 25 mg by mouth daily  At this point patient is stable for discharge   INTAKE / OUTPUT:  Intake/Output Summary (Last 24 hours) at 10/13/2017 0903 Last data filed at 10/13/2017 0800 Gross per 24 hour  Intake 329.5 ml  Output 675 ml  Net -345.5 ml   Name: Catherine Burke MRN: [id number] DOB: 1941-04-05    ADMISSION DATE:  10/09/2017   SUBJECTIVE:   Patient is doing well right now, stable blood pressure and hemodynamics overnight.  Review of Systems:  Constitutional: Feels well. Cardiovascular: No chest pain.  Pulmonary: Denies dyspnea.   10 point review of systems was negative  other than what is documented in the HPI.   VITAL SIGNS: Temp:  [97.6 F (36.4 C)-98.4 F (36.9 C)] 97.6 F (36.4 C) (02/09 0800) Pulse Rate:  [52-69] 59 (02/09 0800) Resp:  [12-21] 15 (02/09 0800) BP: (98-175)/(40-86) 158/67 (02/09 0800) SpO2:  [91 %-97 %] 94 % (02/09 0800)  PHYSICAL EXAMINATION: Physical Examination:   VS: BP (!) 158/67   Pulse (!) 59   Temp 97.6 F (36.4 C) (Oral)   Resp 15   Ht 5\' 4"  (1.626 m)   Wt 150 lb 9.2 oz (68.3 kg)   SpO2 94%   BMI 25.85 kg/m   General Appearance: No distress  Neuro:without focal findings, mental status normal. HEENT: PERRLA, EOM intact. Pulmonary: normal breath sounds   CardiovascularNormal S1,S2.  No m/r/g.   Abdomen: Benign, Soft, non-tender. Renal:  No costovertebral  tenderness  Skin:   warm, no rashes, no ecchymosis  Extremities: normal, no cyanosis, clubbing.    LABORATORY PANEL:   CBC Recent Labs  Lab 10/10/17 0412 10/12/17 1421  WBC 9.4  --   HGB 10.1* 10.2*  HCT 30.5*  --   PLT 282  --     Chemistries  Recent Labs  Lab 10/10/17 0412  10/13/17 0533  NA 140   < > 142  K 3.2*   < > 3.6  CL 108   < > 113*  CO2 23   < > 20*  GLUCOSE 222*   < > 115*  BUN 40*   < > 44*  CREATININE 2.72*   < > 3.12*  CALCIUM 8.4*   < > 8.2*  MG  --    < > 2.1  PHOS  --   --  4.6  AST 13*  --   --   ALT 11*  --   --   ALKPHOS 69  --   --   BILITOT 0.6  --   --    < > = values in this interval not displayed.    Recent Labs  Lab 10/12/17 0747 10/12/17 1158 10/12/17 1613 10/12/17 2012 10/13/17 0429 10/13/17 0736  GLUCAP 157* 151* 99 166* 111* 108*   No results for input(s): PHART, PCO2ART, PO2ART in the last 168 hours. Recent Labs  Lab 10/09/17 1405 10/10/17 0412  AST 15  13*  ALT 12* 11*  ALKPHOS 80 69  BILITOT 0.5 0.6  ALBUMIN 3.5 3.0*    Cardiac Enzymes No results for input(s): TROPONINI in the last 168 hours.  RADIOLOGY:  US Biopsy-no Radiologist  Result Date: 10/12/2017 Murlean Iba, MD     10/12/2017 10:32 AM PROCEDURE: Informed written consent was obtained from the patient after a discussion of the risks, benefits and alternatives to treatment. The patient understands and consents the procedure. A timeout was performed prior to the initiation of the procedure.  Ultrasound scanning was performed of the bilateral flanks. The inferior pole of the RIGHT kidney was selected for biopsy due to location and sonographic window. The procedure was planned. The operative site was prepped and draped in the usual sterile fashion. The overlying soft tissues were anesthetized with 10 mL of 1% XYLOCAINE.  A 18 gauge core needle biopsy device was advanced into the inferior cortex of the RIGHT kidney and 3 core biopsies were obtained under direct  ultrasound guidance. Real time pathologic review confirmed adequate tissue acquisition. Images were saved for documentation purposes. The biopsy device was removed and hemostasis was obtained with manual compression. Post procedural scanning was negative for significant post procedural hemorrhage or additional complication. A dressing was placed. The patient tolerated the procedure well without immediate post procedural complication.   US Renal Artery Duplex Complete  Result Date: 10/11/2017 CLINICAL DATA:  77 year old female with hypertension EXAM: RENAL/URINARY TRACT ULTRASOUND RENAL DUPLEX DOPPLER ULTRASOUND COMPARISON:  Prior renal ultrasound 06/28/2017 FINDINGS: Right Kidney: Length: 10 cm. Diffusely echogenic renal parenchyma with increased corticomedullary differentiation. Mild renal cortical thinning. No hydronephrosis or solid mass. Exophytic sonographically simple cyst arising from the lower pole measures 2.9 x 3.8 x 3.1 cm. Left Kidney: Length: 9.8 cm. Diffusely echogenic renal parenchyma with increased corticomedullary differentiation. Mild renal cortical thinning. No hydronephrosis or solid mass. Sonographically simple interpolar cyst measures 1.4 x 1.5 x 1.6 cm. Bladder:  Within normal limits RENAL DUPLEX ULTRASOUND Right Renal Artery Velocities: Origin:  145 cm/sec Mid:  85 cm/sec Hilum:  163 cm/sec Interlobar:  28 cm/sec Arcuate:  27 cm/sec Left Renal Artery Velocities: Origin:  172 cm/sec Mid:  139 cm/sec Hilum:  62 cm/sec Interlobar:  23 cm/sec Arcuate:  24 cm/sec Aortic Velocity:  136 cm/sec Right Renal-Aortic Ratios: Origin: 1.1 Mid:  0.6 Hilum: 1.2 Interlobar: 0.2 Arcuate: 0.2 Left Renal-Aortic Ratios: Origin: 1.3 Mid: 1.0 Hilum: 0.5 Interlobar: 0.2 Arcuate: 0.2 IMPRESSION: 1. No evidence of hemodynamically significant renal artery stenosis. 2. Echogenic renal parenchyma bilaterally consistent with medical renal disease. 3. Bilateral simple renal cysts. Signed, Criselda Peaches, MD  [facility] Electronically Signed   By: Jacqulynn Cadet M.D.   On: 10/11/2017 13:09   Hermelinda Dellen, DO  2/9/2019Patient ID: Catherine Burke, female   DOB: August 05, 1941, 77 y.o.   MRN: [id number]

## 2017-10-13 NOTE — Progress Notes (Signed)
[facility], [place] 10/13/17  Subjective:   Patient is known to our practice from outpatient.  She has diabetes, hypertension, hyperlipidemia and history of kidney stones in the past For the past several weeks to months, she has been dealing with edema.  She has nephrotic range proteinuria.  Her serologies including hepatitis B and C, ANCA antibodies, ANA, SPEP and UPEP are negative Her serum creatinine has been steadily increasing from a baseline of 1.5/GFR 34 back in August 2018 to 1.8 in October, 1.96 in January and most recently up to 2.38 on February 1 and now to 3.1  Patient had kidney biopsy on February 8.  She tolerated the procedure well.  No postprocedure complications.  Nicardipine drip has been discontinued.  Blood pressure is now under fair control with oral medications.  Patient is able to eat and drink  Without nausea or vomiting.  no frank hematuria.  No pain reported   Objective:  Vital signs in last 24 hours:  Temp:  [97.6 F (36.4 C)-98.4 F (36.9 C)] 97.6 F (36.4 C) (02/09 0800) Pulse Rate:  [52-69] 62 (02/09 1100) Resp:  [12-21] 15 (02/09 1100) BP: (98-175)/(40-86) 158/58 (02/09 1100) SpO2:  [91 %-97 %] 96 % (02/09 1100)  Weight change:  Filed Weights   10/09/17 1245 10/11/17 1438  Weight: 68.4 kg (150 lb 12.7 oz) 68.3 kg (150 lb 9.2 oz)    Intake/Output:    Intake/Output Summary (Last 24 hours) at 10/13/2017 1230 Last data filed at 10/13/2017 1000 Gross per 24 hour  Intake 689.5 ml  Output 675 ml  Net 14.5 ml     Physical Exam: General:  Well-appearing, laying in the bed  HEENT  anicteric, moist oral mucous membranes  Neck  supple  Pulm/lungs  clear to auscultation bilaterally  CVS/Heart  regular rate and rhythm, no rub or gallop  Abdomen:   Soft, nontender  Extremities:  trace Edema  Neurologic:  Alert, oriented  Skin:  No acute rashes          Basic Metabolic Panel:  Recent Labs  Lab 10/09/17 1405  10/10/17 0412 10/11/17 0346 10/12/17 0517 10/13/17 0533  NA 141 140 141 143 142  K 3.0* 3.2* 3.4* 3.3* 3.6  CL 106 108 109 112* 113*  CO2 24 23 23 23  20*  GLUCOSE 245* 222* 181* 174* 115*  BUN 37* 40* 47* 45* 44*  CREATININE 2.58* 2.72* 3.10* 3.17* 3.12*  CALCIUM 8.8* 8.4* 8.5* 8.2* 8.2*  MG  --   --  1.9  --  2.1  PHOS  --   --   --   --  4.6     CBC: Recent Labs  Lab 10/09/17 1405 10/10/17 0412 10/12/17 1421  WBC 8.7 9.4  --   HGB 10.8* 10.1* 10.2*  HCT 32.3* 30.5*  --   MCV 79.2* 80.3  --   PLT 299 282  --      No results found for: HEPBSAG, HEPBSAB, HEPBIGM    Microbiology:  Recent Results (from the past 240 hour(s))  MRSA PCR Screening     Status: Abnormal   Collection Time: 10/11/17  2:35 PM  Result Value Ref Range Status   MRSA by PCR POSITIVE (A) NEGATIVE Final    Comment:        The GeneXpert MRSA Assay (FDA approved for NASAL specimens only), is one component of a comprehensive MRSA colonization surveillance program. It is not intended to diagnose MRSA infection nor to guide  or monitor treatment for MRSA infections. RESULT CALLED TO, READ BACK BY AND VERIFIED WITH: SHERRELL GREEN AT 6767 ON 10/11/2017 JJB Performed at [facility], [address] [place], [place] [address]     Coagulation Studies: No results for input(s): LABPROT, INR in the last 72 hours.  Urinalysis: No results for input(s): COLORURINE, LABSPEC, PHURINE, GLUCOSEU, HGBUR, BILIRUBINUR, KETONESUR, PROTEINUR, UROBILINOGEN, NITRITE, LEUKOCYTESUR in the last 72 hours.  Invalid input(s): APPERANCEUR    Imaging: US Biopsy-no Radiologist  Result Date: 10/12/2017 Murlean Iba, MD     10/12/2017 10:32 AM PROCEDURE: Informed written consent was obtained from the patient after a discussion of the risks, benefits and alternatives to treatment. The patient understands and consents the procedure. A timeout was performed prior to the initiation of the procedure.  Ultrasound  scanning was performed of the bilateral flanks. The inferior pole of the RIGHT kidney was selected for biopsy due to location and sonographic window. The procedure was planned. The operative site was prepped and draped in the usual sterile fashion. The overlying soft tissues were anesthetized with 10 mL of 1% XYLOCAINE.  A 18 gauge core needle biopsy device was advanced into the inferior cortex of the RIGHT kidney and 3 core biopsies were obtained under direct ultrasound guidance. Real time pathologic review confirmed adequate tissue acquisition. Images were saved for documentation purposes. The biopsy device was removed and hemostasis was obtained with manual compression. Post procedural scanning was negative for significant post procedural hemorrhage or additional complication. A dressing was placed. The patient tolerated the procedure well without immediate post procedural complication.   US Renal Artery Duplex Complete  Result Date: 10/11/2017 CLINICAL DATA:  77 year old female with hypertension EXAM: RENAL/URINARY TRACT ULTRASOUND RENAL DUPLEX DOPPLER ULTRASOUND COMPARISON:  Prior renal ultrasound 06/28/2017 FINDINGS: Right Kidney: Length: 10 cm. Diffusely echogenic renal parenchyma with increased corticomedullary differentiation. Mild renal cortical thinning. No hydronephrosis or solid mass. Exophytic sonographically simple cyst arising from the lower pole measures 2.9 x 3.8 x 3.1 cm. Left Kidney: Length: 9.8 cm. Diffusely echogenic renal parenchyma with increased corticomedullary differentiation. Mild renal cortical thinning. No hydronephrosis or solid mass. Sonographically simple interpolar cyst measures 1.4 x 1.5 x 1.6 cm. Bladder:  Within normal limits RENAL DUPLEX ULTRASOUND Right Renal Artery Velocities: Origin:  145 cm/sec Mid:  85 cm/sec Hilum:  163 cm/sec Interlobar:  28 cm/sec Arcuate:  27 cm/sec Left Renal Artery Velocities: Origin:  172 cm/sec Mid:  139 cm/sec Hilum:  62 cm/sec Interlobar:  23  cm/sec Arcuate:  24 cm/sec Aortic Velocity:  136 cm/sec Right Renal-Aortic Ratios: Origin: 1.1 Mid:  0.6 Hilum: 1.2 Interlobar: 0.2 Arcuate: 0.2 Left Renal-Aortic Ratios: Origin: 1.3 Mid: 1.0 Hilum: 0.5 Interlobar: 0.2 Arcuate: 0.2 IMPRESSION: 1. No evidence of hemodynamically significant renal artery stenosis. 2. Echogenic renal parenchyma bilaterally consistent with medical renal disease. 3. Bilateral simple renal cysts. Signed, Criselda Peaches, MD [facility] Electronically Signed   By: Jacqulynn Cadet M.D.   On: 10/11/2017 13:09     Medications:   . amiodarone     . atorvastatin  10 mg Oral q morning - 10a  . benazepril  20 mg Oral BID  . Chlorhexidine Gluconate Cloth  6 each Topical Q0600  . cloNIDine  0.1 mg Oral Once  . hydrALAZINE  10 mg Intravenous Once  . hydrALAZINE  100 mg Oral Q8H  . insulin aspart  0-5 Units Subcutaneous QHS  . insulin aspart  0-9 Units Subcutaneous TID  WC  . insulin aspart  3 Units Subcutaneous TID WC  . insulin glargine  20 Units Subcutaneous QHS  . metoprolol succinate  100 mg Oral Daily  . mupirocin ointment  1 application Nasal BID  . NIFEdipine  60 mg Oral QHS  . spironolactone  25 mg Oral Daily   acetaminophen **OR** acetaminophen, hydrALAZINE  Assessment/ Plan:  77 y.o. caucasian female With diabetes, hypertension, hyperlipidemia, history of kidney stones  1.  Acute renal failure 2.  Chronic kidney disease stage III, baseline Cr 1.5, GFR 34 3.  Type 2 diabetes with chronic kidney disease 4.  Nephrotic range proteinuria, 9.1 gm by P/C ratio in Jan 2019 5.  Hematuria 6.  Severe hypertension 4.  Lower extremity edema 8.  Hyperlipidemia 9.  Secondary hyperparathyroidism  Plan: Resume home blood pressure medications  renal artery duplex done this admission  is negative for stenosis Added spironolactone and nifedipine to her home regimen We will follow-up with her normal office  on Friday    LOS: [place] 2/9/201912:30 PM  [place], [place]

## 2017-10-16 ENCOUNTER — Other Ambulatory Visit (HOSPITAL_COMMUNITY): Payer: Self-pay | Admitting: Interventional Radiology

## 2017-10-22 ENCOUNTER — Encounter: Payer: Self-pay | Admitting: Nephrology

## 2017-10-22 LAB — SURGICAL PATHOLOGY

## 2017-11-22 ENCOUNTER — Encounter: Payer: Self-pay | Admitting: Dietician

## 2017-11-22 ENCOUNTER — Encounter: Payer: Medicare Other | Attending: Nephrology | Admitting: Dietician

## 2017-11-22 VITALS — Ht 64.0 in | Wt 148.7 lb

## 2017-11-22 DIAGNOSIS — Z713 Dietary counseling and surveillance: Secondary | ICD-10-CM | POA: Insufficient documentation

## 2017-11-22 DIAGNOSIS — E1122 Type 2 diabetes mellitus with diabetic chronic kidney disease: Secondary | ICD-10-CM | POA: Diagnosis not present

## 2017-11-22 DIAGNOSIS — N183 Chronic kidney disease, stage 3 unspecified: Secondary | ICD-10-CM

## 2017-11-22 DIAGNOSIS — N189 Chronic kidney disease, unspecified: Secondary | ICD-10-CM | POA: Diagnosis not present

## 2017-11-22 DIAGNOSIS — Z6825 Body mass index (BMI) 25.0-25.9, adult: Secondary | ICD-10-CM | POA: Diagnosis not present

## 2017-11-22 DIAGNOSIS — Z794 Long term (current) use of insulin: Secondary | ICD-10-CM

## 2017-11-22 DIAGNOSIS — I129 Hypertensive chronic kidney disease with stage 1 through stage 4 chronic kidney disease, or unspecified chronic kidney disease: Secondary | ICD-10-CM | POA: Diagnosis not present

## 2017-11-22 NOTE — Progress Notes (Signed)
Medical Nutrition Therapy: Visit start time: 10:30am  end time: 12:00  Assessment:  Diagnosis: Type 2 Diabetes, Kidney disease Past medical history: HTN, GERD Psychosocial issues/ stress concerns:none identified Preferred learning method:  . Hands-on Current weight: 148.7 lbs Height: 64 in Medications, supplements:see list Progress and evaluation:  Patient accompanied by her daughter in for initial medical nutrition therapy visit. She expressed feeling overwhelmed with trying to follow diet recommendations for both diabetes and kidney disease. Her last GFR was 15 and she was told she might have to have dialysis. Her most recent phosphorus was 4.6 and potassium was in normal range at 3.4. She checks her fasting glucose daily with range in the 80's-100's. She states she has made positive changes in her diet. Is not adding salt in cooking or at the table. She states, "I have been eating mainly salads because I don't know what I can eat."She cans and freezes most of her vegetables. She prepares most of her meals and dines "out" 2 x per week.  Physical activity: no structured exercise.  Dietary Intake:  Usual eating pattern includes 3 meals and 1 snacks per day. Dining out frequency: 2 meals per week.  Breakfast: cornflakes/milk or [employer] egg biscuit, coffee 3-4 cups in am Lunch: 1:00pm- homemade chicken salad with eggs/ onions; no added salt, also unsweetened applesauce Supper:6-6:30pm- ex. Meatloaf, no salt added mashed potatoes, crowder peas/corn -no salt added Snack: applesauce or some type of fruit Beverages: coffee, water, 2-3 glasses tea half sweetened/half unsweetened.  Nutrition Care Education: Diabetes:   Instructed on a meal plan based on 1500 calories including carbohydrate counting and how to better balance carbohydrate protein and non-starchy vegetables. Stressed importance of continued good glucose control to kidney health. Kidney disease: Instructed on dietary  guidelines for kidney heathy including recommendations for sodium and phosphorus and practical ways to decrease as well as specific product suggestions. Gave and reviewed a cookbook "Kidney Cooking" that gives nutrient information including sodium, potassium and phosphorus for all of the recipes.  Nutritional Diagnosis:  NB-1.1 Food and nutrition-related knowledge deficit As related to diet for kidney disease.  As evidenced by diet history..  Intervention:  Balance meals with protein, 2-3 servings of carbohydrates (starch, fruit, milk/yogurt)- (30-45 mg)  and non-starchy vegetables. Switch from biscuits to English muffin with egg at breakfast. Include a carbohydrate (starch) serving and 1 oz protein for evening snack. Try Simply Jiff peanut butter but limit peanut butter and nuts. Limit milk/yogurt to 1-2 servings per day. StarKist very low sodium tuna. Limit peanut butter (2 Tbsp) or 1/4 cup nuts to once daily. Phosphorus - no more than 800mg  per day Sodium- no more than 1500 mg /day; 500 mg per meal. Count casseroles as 2 servings of carbohydrate. No added salt in cooking or at the table. Read labels for sodium. Refer to Kidney cookbook given for recipe ideas. Use calorieking.com to look up nutrition information especially for chain restaurants.  Education Materials given:  Marland Kitchen Diet for Kidney Health . Food lists/ Planning A Balanced Meal . Kidney Cooking cookbook . Sample meal pattern/ menus . Goals/ instructions Learner/ who was taught:  . Patient  . Family member: daughter  Level of understanding: . Partial understanding; needs review/ practice Demonstrated degree of understanding via:   Teach back Learning barriers: . None Willingness to learn/ readiness for change: . Eager, change in progress  Monitoring and Evaluation:  Dietary intake, exercise, and body weight      follow up: 12/25/17 at 3:30pm.

## 2017-11-22 NOTE — Patient Instructions (Addendum)
Balance meals with protein, 2-3 servings of carbohydrates (starch, fruit, milk/yogurt)- (30-45 mg)  and non-starchy vegetables. Switch from biscuits to English muffin with egg at breakfast. Include a carbohydrate (starch) serving and 1 oz protein for bedtime snack. Try Simply Jiff peanut butter but limit peanut butter and nuts. Limit milk/yogurt to 1-2 servings per day. Star[employer]ist very low sodium tuna. Limit peanut butter (2 Tbsp) or 1/4 cup nuts to once daily. Phosphorus - no more than 800mg  per day Sodium- no more than 1500 mg /day; 500 mg per meal. Count casseroles as 2 servings of carbohydrate. No added salt in cooking or at the table. Read labels for sodium. Refer to Kidney cookbook given for recipe ideas. Use calorieking.com to look up nutrition information especially for chain restaurants.

## 2017-12-25 ENCOUNTER — Ambulatory Visit: Payer: Medicare Other | Admitting: Dietician

## 2018-01-24 ENCOUNTER — Telehealth: Payer: Self-pay | Admitting: Dietician

## 2018-01-24 ENCOUNTER — Encounter: Payer: Self-pay | Admitting: Dietician

## 2018-01-24 ENCOUNTER — Ambulatory Visit: Payer: Medicare Other | Admitting: Dietician

## 2018-01-24 NOTE — Telephone Encounter (Signed)
Called Catherine Burke to see if she wanted to reschedule her appointment with dietitian that she missed this morning. She stated she forgot and that she will be meeting with a vascular surgeon 02/08/18 to begin process for home hemodialysis. Patient will wait to find out if she will be referred to a renal dietitian. Encouraged her to call if would like to schedule a follow-up at our office.

## 2018-02-08 ENCOUNTER — Ambulatory Visit (INDEPENDENT_AMBULATORY_CARE_PROVIDER_SITE_OTHER): Payer: Medicare Other | Admitting: Vascular Surgery

## 2018-02-08 ENCOUNTER — Other Ambulatory Visit (INDEPENDENT_AMBULATORY_CARE_PROVIDER_SITE_OTHER): Payer: Self-pay | Admitting: Vascular Surgery

## 2018-02-08 ENCOUNTER — Encounter (INDEPENDENT_AMBULATORY_CARE_PROVIDER_SITE_OTHER): Payer: Self-pay | Admitting: Vascular Surgery

## 2018-02-08 VITALS — BP 193/76 | HR 71 | Resp 16 | Ht 62.0 in | Wt 145.8 lb

## 2018-02-08 DIAGNOSIS — N184 Chronic kidney disease, stage 4 (severe): Secondary | ICD-10-CM

## 2018-02-08 DIAGNOSIS — E1122 Type 2 diabetes mellitus with diabetic chronic kidney disease: Secondary | ICD-10-CM

## 2018-02-08 DIAGNOSIS — I10 Essential (primary) hypertension: Secondary | ICD-10-CM | POA: Diagnosis not present

## 2018-02-08 DIAGNOSIS — N183 Chronic kidney disease, stage 3 (moderate): Secondary | ICD-10-CM

## 2018-02-08 DIAGNOSIS — Z794 Long term (current) use of insulin: Secondary | ICD-10-CM

## 2018-02-08 DIAGNOSIS — K219 Gastro-esophageal reflux disease without esophagitis: Secondary | ICD-10-CM

## 2018-02-08 DIAGNOSIS — E785 Hyperlipidemia, unspecified: Secondary | ICD-10-CM | POA: Diagnosis not present

## 2018-02-08 NOTE — Assessment & Plan Note (Signed)
Likely an underlying cause of her renal disease and blood pressure control important in reducing the progression of atherosclerotic disease. On appropriate oral medications.

## 2018-02-08 NOTE — Assessment & Plan Note (Signed)
Likely and underlying cause of her renal disease and blood glucose control important in reducing the progression of atherosclerotic disease. Also, involved in wound healing. On appropriate medications.

## 2018-02-08 NOTE — Progress Notes (Signed)
Patient ID: Catherine Burke, female   DOB: 1940/12/17, 77 y.o.   MRN: [id number]  Chief Complaint  Patient presents with  . New Patient (Initial Visit)    ref Candiss Norse for pd cath evaluation    HPI Catherine Burke is a 77 y.o. female.  I am asked to see the patient by DR. Singh for evaluation of PD catheter placement.  The patient reports gradual decline in her energy and activity levels over several months.  This is associated with leg swelling and a decreasing appetite.  This correlates with a continual decline in kidney function.  The last GFR I see was 19 although that may not be the most recent.  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  She and her nephrologist have decided that she would like to do peritoneal dialysis as her dialysis choice.  She reports her only abdominal surgery was a hysterectomy and a laparoscopic cholecystectomy.  She does not have any significant abdominal pain or bowel or bladder symptoms currently.  No fevers or chills.   Past Medical History:  Diagnosis Date  . Arthritis   . Diabetes mellitus   . GERD (gastroesophageal reflux disease)   . HLD (hyperlipidemia)   . Hypercholesterolemia   . Hypertension   . Kidney stones   . Osteoporosis   . Proteinuria     Past Surgical History:  Procedure Laterality Date  . ABDOMINAL HYSTERECTOMY    . BACK SURGERY    . BREAST CYST ASPIRATION     unsure of laterality   . CHOLECYSTECTOMY      Family History  Problem Relation Age of Onset  . Other Mother   . Heart attack Father   . Breast cancer Paternal Aunt   no bleeding or clotting disorders  Social History Social History   Tobacco Use  . Smoking status: Never Smoker  . Smokeless tobacco: Never Used  Substance Use Topics  . Alcohol use: No    Alcohol/week: 0.0 oz  . Drug use: No     Allergies  Allergen Reactions  . Penicillins Anaphylaxis    Current Outpatient Medications  Medication Sig Dispense Refill    . atorvastatin (LIPITOR) 20 MG tablet Take 1 tablet (20 mg total) by mouth daily. (Patient taking differently: Take 10 mg by mouth daily. ) 30 tablet 1  . benazepril (LOTENSIN) 20 MG tablet Take 1 tablet (20 mg total) by mouth 2 (two) times daily. 60 tablet 1  . hydrALAZINE (APRESOLINE) 100 MG tablet Take 1 tablet (100 mg total) by mouth every 8 (eight) hours. 90 tablet 1  . NIFEdipine (PROCARDIA-XL/ADALAT CC) 60 MG 24 hr tablet Take 1 tablet (60 mg total) by mouth at bedtime. (Patient taking differently: Take 60 mg by mouth at bedtime. ) 30 tablet 1  . omeprazole (PRILOSEC) 40 MG capsule Take by mouth.    . predniSONE (DELTASONE) 10 MG tablet TK 2 TS PO QD  3  . spironolactone (ALDACTONE) 25 MG tablet Take 1 tablet (25 mg total) by mouth daily. 30 tablet 1  . torsemide (DEMADEX) 20 MG tablet Take 1 tablet (20 mg total) by mouth daily as needed (Leg Swelling.).    Marland Kitchen ACCU-CHEK AVIVA PLUS test strip TESTING TWICE DAILY. (Patient not taking: Reported on 02/08/2018) 100 each 11  . Diphenhyd-Hydrocort-Nystatin (FIRST-DUKES MOUTHWASH) SUSP Swish and swallow three times a day for sore throat (Patient not taking: Reported on 10/09/2017) 1 Bottle 0  . Insulin  Glargine (LANTUS SOLOSTAR) 100 UNIT/ML Solostar Pen Inject 50-60 units at bedtime as directed (Patient not taking: Reported on 02/08/2018) 15 mL 6  . Insulin Pen Needle 31G X 5 MM MISC As directed (Patient not taking: Reported on 02/08/2018) 100 each 5  . metoprolol succinate (TOPROL-XL) 100 MG 24 hr tablet Take 1 tablet (100 mg total) by mouth daily. Take with or immediately following a meal. (Patient taking differently: Take 100 mg by mouth daily. Take with or immediately following a meal. ) 30 tablet 1   No current facility-administered medications for this visit.       REVIEW OF SYSTEMS (Negative unless checked)  Constitutional: [] Weight loss  [] Fever  [] Chills Cardiac: [] Chest pain   [] Chest pressure   [] Palpitations   [] Shortness of breath when  laying flat   [] Shortness of breath at rest   [] Shortness of breath with exertion. Vascular:  [] Pain in legs with walking   [] Pain in legs at rest   [] Pain in legs when laying flat   [] Claudication   [] Pain in feet when walking  [] Pain in feet at rest  [] Pain in feet when laying flat   [] History of DVT   [] Phlebitis   [x] Swelling in legs   [] Varicose veins   [] Non-healing ulcers Pulmonary:   [] Uses home oxygen   [] Productive cough   [] Hemoptysis   [] Wheeze  [] COPD   [] Asthma Neurologic:  [] Dizziness  [] Blackouts   [] Seizures   [] History of stroke   [] History of TIA  [] Aphasia   [] Temporary blindness   [] Dysphagia   [] Weakness or numbness in arms   [] Weakness or numbness in legs Musculoskeletal:  [x] Arthritis   [] Joint swelling   [x] Joint pain   [] Low back pain Hematologic:  [] Easy bruising  [] Easy bleeding   [] Hypercoagulable state   [] Anemic  [] Hepatitis Gastrointestinal:  [] Blood in stool   [] Vomiting blood  [x] Gastroesophageal reflux/heartburn   [] Abdominal pain Genitourinary:  [x] Chronic kidney disease   [] Difficult urination  [] Frequent urination  [] Burning with urination   [] Hematuria Skin:  [] Rashes   [] Ulcers   [] Wounds Psychological:  [] History of anxiety   []  History of major depression.    Physical Exam BP (!) 193/76 (BP Location: Right Arm)   Pulse 71   Resp 16   Ht 5\' 2"  (1.575 m)   Wt 145 lb 12.8 oz (66.1 kg)   BMI 26.67 kg/m  Gen:  WD/WN, NAD. Appears younger than stated age. Head: [place]/AT, No temporalis wasting. Ear/Nose/Throat: Hearing grossly intact, nares w/o erythema or drainage, oropharynx w/o Erythema/Exudate Eyes: Conjunctiva clear, sclera non-icteric  Neck: trachea midline.  No JVD.  Pulmonary:  Good air movement, respirations not labored, no use of accessory muscles Cardiac: RRR Vascular:  Vessel Right Left  Radial Palpable Palpable                          PT 1+ Palpable 1+ Palpable  DP Palpable Palpable   Gastrointestinal: soft,  non-tender/non-distended. No guarding/reflex.  Musculoskeletal: M/S 5/5 throughout.  Extremities without ischemic changes.  No deformity or atrophy. 1+ BLE edema. Neurologic: Sensation grossly intact in extremities.  Symmetrical.  Speech is fluent. Motor exam as listed above. Psychiatric: Judgment intact, Mood & affect appropriate for pt's clinical situation. Dermatologic: No rashes or ulcers noted.  No cellulitis or open wounds.  Radiology No results found.  Labs No results found for this or any previous visit (from the past 2160 hour(s)).  Assessment/Plan:  Essential hypertension Likely  an underlying cause of her renal disease and blood pressure control important in reducing the progression of atherosclerotic disease. On appropriate oral medications.   GERD (gastroesophageal reflux disease) Continue antihypertensive medications as already ordered, these medications have been reviewed and there are no changes at this time.  Avoidence of caffeine and alcohol  Moderate elevation of the head of the bed   Diabetes mellitus Likely and underlying cause of her renal disease and blood glucose control important in reducing the progression of atherosclerotic disease. Also, involved in wound healing. On appropriate medications.   Hyperlipidemia lipid control important in reducing the progression of atherosclerotic disease. Continue statin therapy   Chronic kidney disease (CKD), stage IV (severe) (HCC)  Recommend:  The patient has advanced renal disease but currently does not have dialysis access.  The patient has elected to move forward with peritoneal dialysis. Although there is a history of prior operations, this does not appear at this time to be a prohibitive situation.  This was also discussed.  Risk and benefits were reviewed the patient.  Indications for the procedure were reviewed.  All questions were answered, the patient agrees to proceed with laparoscopic PD catheter  insertion.  Discussion included the treatment options for dialysis access and perioperative management.        Leotis Pain 02/08/2018, 10:56 AM   This note was created with Dragon medical transcription system.  Any errors from dictation are unintentional.

## 2018-02-08 NOTE — Assessment & Plan Note (Signed)
Continue antihypertensive medications as already ordered, these medications have been reviewed and there are no changes at this time.  Avoidence of caffeine and alcohol  Moderate elevation of the head of the bed

## 2018-02-08 NOTE — Patient Instructions (Signed)
Peritoneal Dialysis Catheter Placement Peritoneal dialysis catheter placement is a surgery to insert a thin, flexible tube (catheter) in your abdomen. This surgery must be done before you begin peritoneal dialysis. Dialysis is a treatment that replaces some of the work that healthy kidneys do. During dialysis, wastes, salt, and extra water are removed from the blood. In peritoneal dialysis, these tasks are performed by transferring a fluid called dialysate to and from your abdomen during each session. The fluid goes through the catheter to enter the abdomen at the start of each dialysis session, and it drains out of the body through the catheter at the end of each session. The catheter will be small, soft, and easy to conceal. The surgery is usually done at least 2 weeks before you begin dialysis. Tell a health care provider about:  Any allergies you have.  All medicines you are taking, including vitamins, herbs, eye drops, creams, and over-the-counter medicines.  Use of steroids (by mouth or as creams).  Any problems you or family members have had with anesthetic medicines.  Any blood disorders you have.  Any surgeries you have had.  Smoking history.  Possibility of pregnancy, if this applies.  Any medical conditions you have. What are the risks? Generally, peritoneal dialysis catheter placement is a safe procedure. However, as with any procedure, complications can occur. Possible complications include:  Excessive bleeding.  Pain.  A collection of blood near the incision (hematoma).  Infection.  Slow healing.  Catheter problems after the surgery, such as the catheter becoming blocked, the catheter moving out of place, the catheter poking into or wrapping around intestines, or fluid leaking around the catheter.  Scarring.  Skin damage.  Damage to blood vessels, tissues, or organs (such as the intestines) in the abdomen area.  What happens before the procedure?  Ask your  health care provider about changing or stopping your regular medicines. You may need to stop taking certain medicines up to 2 weeks before the procedure.  Do not eat or drink anything for at least 8 hours before the procedure.  You may be asked to wash with an antibacterial soap the night before or the morning of the procedure.  Make plans to have someone drive you home after the procedure or after your hospital stay. Ask your health care provider whether you should expect to stay in the hospital overnight or go home the same day. What happens during the procedure? The surgeon may use either an open or laparoscopic technique for this surgery.  Open surgery. ? You will be given a medicine to make you sleep through the procedure (general anesthetic). ? Once you are asleep, the surgeon will make a small cut (incision) in your abdomen. ? The catheter will be put in place. ? The incision will be closed with stitches or staples.  Laparoscopic surgery. ? You will be given a medicine to numb the site selected for the incision (local anesthetic). ? When the area is numb, the surgeon will make a small incision in your abdomen. ? A thin, lighted tube with a tiny camera on the end (laparoscope) will be put through the incision. The camera sends pictures to a video screen in the operating room. This lets the surgeon see inside the abdomen during the procedure. ? The catheter will be put in place. ? The incision will be closed with stitches or staples.  What happens after the procedure?  You will be monitored closely in a recovery area. You may feel groggy   from the anesthetic.  You may have some pain. If so, you will be given pain medicine.  You may be able to go home the same day as the procedure, or you may need to stay in the hospital overnight.  You will be given instructions on how to care for the catheter and how it is used for the dialysis process.  Your body will heal around the catheter  to hold it in place. This information is not intended to replace advice given to you by your health care provider. Make sure you discuss any questions you have with your health care provider. Document Released: 08/09/2009 Document Revised: 01/27/2016 Document Reviewed: 02/10/2013 Elsevier Interactive Patient Education  2017 [employer]  

## 2018-02-08 NOTE — Assessment & Plan Note (Signed)
Recommend:  The patient has advanced renal disease but currently does not have dialysis access.  The patient has elected to move forward with peritoneal dialysis. Although there is a history of prior operations, this does not appear at this time to be a prohibitive situation.  This was also discussed.  Risk and benefits were reviewed the patient.  Indications for the procedure were reviewed.  All questions were answered, the patient agrees to proceed with laparoscopic PD catheter insertion.  Discussion included the treatment options for dialysis access and perioperative management.   

## 2018-02-08 NOTE — Assessment & Plan Note (Signed)
lipid control important in reducing the progression of atherosclerotic disease. Continue statin therapy  

## 2018-02-11 ENCOUNTER — Other Ambulatory Visit: Payer: Self-pay

## 2018-02-11 ENCOUNTER — Encounter
Admission: RE | Admit: 2018-02-11 | Discharge: 2018-02-11 | Disposition: A | Discharge status: Home / Self Care | Payer: Medicare Other | Source: Ambulatory Visit | Attending: Vascular Surgery | Admitting: Vascular Surgery

## 2018-02-11 DIAGNOSIS — E785 Hyperlipidemia, unspecified: Secondary | ICD-10-CM | POA: Diagnosis not present

## 2018-02-11 DIAGNOSIS — E1122 Type 2 diabetes mellitus with diabetic chronic kidney disease: Secondary | ICD-10-CM | POA: Diagnosis not present

## 2018-02-11 DIAGNOSIS — Z880 Allergy status to penicillin: Secondary | ICD-10-CM | POA: Diagnosis not present

## 2018-02-11 DIAGNOSIS — Z01810 Encounter for preprocedural cardiovascular examination: Secondary | ICD-10-CM

## 2018-02-11 DIAGNOSIS — K219 Gastro-esophageal reflux disease without esophagitis: Secondary | ICD-10-CM | POA: Diagnosis not present

## 2018-02-11 DIAGNOSIS — I129 Hypertensive chronic kidney disease with stage 1 through stage 4 chronic kidney disease, or unspecified chronic kidney disease: Secondary | ICD-10-CM | POA: Diagnosis not present

## 2018-02-11 DIAGNOSIS — Z794 Long term (current) use of insulin: Secondary | ICD-10-CM | POA: Diagnosis not present

## 2018-02-11 DIAGNOSIS — N184 Chronic kidney disease, stage 4 (severe): Secondary | ICD-10-CM | POA: Diagnosis present

## 2018-02-11 DIAGNOSIS — Z9049 Acquired absence of other specified parts of digestive tract: Secondary | ICD-10-CM | POA: Diagnosis not present

## 2018-02-11 DIAGNOSIS — Z90710 Acquired absence of both cervix and uterus: Secondary | ICD-10-CM | POA: Diagnosis not present

## 2018-02-11 DIAGNOSIS — Z87442 Personal history of urinary calculi: Secondary | ICD-10-CM | POA: Diagnosis not present

## 2018-02-11 DIAGNOSIS — Z79899 Other long term (current) drug therapy: Secondary | ICD-10-CM | POA: Diagnosis not present

## 2018-02-11 LAB — TYPE AND SCREEN
ABO/RH(D): A POS
Antibody Screen: NEGATIVE

## 2018-02-11 LAB — BASIC METABOLIC PANEL
ANION GAP: 11 (ref 5–15)
BUN: 65 mg/dL — ABNORMAL HIGH (ref 6–20)
CHLORIDE: 103 mmol/L (ref 101–111)
CO2: 19 mmol/L — ABNORMAL LOW (ref 22–32)
Calcium: 8.2 mg/dL — ABNORMAL LOW (ref 8.9–10.3)
Creatinine, Ser: 3.63 mg/dL — ABNORMAL HIGH (ref 0.44–1.00)
GFR calc Af Amer: 13 mL/min — ABNORMAL LOW (ref >60)
GFR, EST NON AFRICAN AMERICAN: 11 mL/min — AB (ref >60)
Glucose, Bld: 374 mg/dL — ABNORMAL HIGH (ref 65–99)
POTASSIUM: 4.8 mmol/L (ref 3.5–5.1)
SODIUM: 133 mmol/L — AB (ref 135–145)

## 2018-02-11 LAB — SURGICAL PCR SCREEN
MRSA, PCR: NEGATIVE
Staphylococcus aureus: NEGATIVE

## 2018-02-11 LAB — CBC WITH DIFFERENTIAL/PLATELET
BASOS ABS: 0 10*3/uL (ref 0–0.1)
Basophils Relative: 0 %
EOS PCT: 0 %
Eosinophils Absolute: 0 10*3/uL (ref 0–0.7)
HCT: 29.2 % — ABNORMAL LOW (ref 35.0–47.0)
HEMOGLOBIN: 9.5 g/dL — AB (ref 12.0–16.0)
LYMPHS ABS: 0.4 10*3/uL — AB (ref 1.0–3.6)
LYMPHS PCT: 3 %
MCH: 26 pg (ref 26.0–34.0)
MCHC: 32.4 g/dL (ref 32.0–36.0)
MCV: 80 fL (ref 80.0–100.0)
Monocytes Absolute: 0.1 10*3/uL — ABNORMAL LOW (ref 0.2–0.9)
Monocytes Relative: 1 %
Neutro Abs: 11.4 10*3/uL — ABNORMAL HIGH (ref 1.4–6.5)
Neutrophils Relative %: 96 %
PLATELETS: 303 10*3/uL (ref 150–440)
RBC: 3.65 MIL/uL — ABNORMAL LOW (ref 3.80–5.20)
RDW: 14.9 % — ABNORMAL HIGH (ref 11.5–14.5)
WBC: 11.9 10*3/uL — AB (ref 3.6–11.0)

## 2018-02-11 LAB — PROTIME-INR
INR: 0.86
Prothrombin Time: 11.6 seconds (ref 11.4–15.2)

## 2018-02-11 LAB — APTT: APTT: 28 s (ref 24–36)

## 2018-02-11 NOTE — Patient Instructions (Signed)
Your procedure is scheduled on: Wed. 6/12 Report to Day Surgery. To find out your arrival time please call [phone] between 1PM - 3PM on Tues 6/11.  Remember: Instructions that are not followed completely may result in serious medical risk, up to and including death, or upon the discretion of your surgeon and anesthesiologist your surgery may need to be rescheduled.     _X__ 1. Do not eat food after midnight the night before your procedure.                 No gum chewing or hard candies. You may drink clear liquids up to 2 hours                 before you are scheduled to arrive for your surgery- DO not drink clear                 liquids within 2 hours of the start of your surgery.                 Clear Liquids include:  water,  __X__2.  On the morning of surgery brush your teeth with toothpaste and water, you may rinse your mouth with mouthwash if you wish.  Do not swallow any              toothpaste of mouthwash.     ___ 3.  No Alcohol for 24 hours before or after surgery.   ___ 4.  Do Not Smoke or use e-cigarettes For 24 Hours Prior to Your Surgery.                 Do not use any chewable tobacco products for at least 6 hours prior to                 surgery.  ____  5.  Bring all medications with you on the day of surgery if instructed.   __x__  6.  Notify your doctor if there is any change in your medical condition      (cold, fever, infections).     Do not wear jewelry, make-up, hairpins, clips or nail polish. Do not wear lotions, powders, or perfumes. You may wear deodorant. Do not shave 48 hours prior to surgery. Men may shave face and neck. Do not bring valuables to the hospital.    [facility] is not responsible for any belongings or valuables.  Contacts, dentures or bridgework may not be worn into surgery. Leave your suitcase in the car. After surgery it may be brought to your room. For patients admitted to the hospital, discharge time is  determined by your treatment team.   Patients discharged the day of surgery will not be allowed to drive home.   Please read over the following fact sheets that you were given:    __x__ Take these medicines the morning of surgery with A SIP OF WATER:    1. atorvastatin (LIPITOR) 20 MG tablet  2. hydrALAZINE (APRESOLINE) 100 MG tablet  3. omeprazole (PRILOSEC) 40 MG capsule extra dose the night before and morning of surgery  4.  5.predniSONE (DELTASONE) 10 MG tablet  6.  ____ Fleet Enema (as directed)   _x___ Use CHG Soap as directed  ____ Use inhalers on the day of surgery  ____ Stop metformin 2 days prior to surgery    __x__ Take 1/2 of usual insulin dose the night before surgery. No insulin the morning  of surgery.   ____ Stop Coumadin/Plavix/aspirin on  ____ Stop Anti-inflammatories on    ____ Stop supplements until after surgery.    ____ Bring C-Pap to the hospital.

## 2018-02-12 ENCOUNTER — Telehealth (INDEPENDENT_AMBULATORY_CARE_PROVIDER_SITE_OTHER): Payer: Self-pay

## 2018-02-12 MED ORDER — CLINDAMYCIN PHOSPHATE 900 MG/50ML IV SOLN
900.0000 mg | INTRAVENOUS | Status: AC
  Administered 2018-02-13: 600 mg via INTRAVENOUS

## 2018-02-12 NOTE — Telephone Encounter (Signed)
PATIENT WAS CLEARED FOR SURGERY.

## 2018-02-12 NOTE — Telephone Encounter (Signed)
Sherry from Pre-op called and stated that she has reached out to the patient's primary which is Dr. Doy Hutching, because the patient's glucose was 366 today and that she needed cardiac clearance. She states that she has faxed over for the clearance but that she didn't think that it would come back by tomorrow?

## 2018-02-12 NOTE — Pre-Procedure Instructions (Signed)
GLU 363/EKG/REQUEST FOR OPTIMIZATIONCSALLED AND FAXED TO ASHLEY AT DR Doy Hutching. FAXED AND LM ON NURSES LINE FOR DR Lucky Cowboy

## 2018-02-13 ENCOUNTER — Ambulatory Visit
Admission: RE | Admit: 2018-02-13 | Discharge: 2018-02-13 | Disposition: A | Discharge status: Home / Self Care | Payer: Medicare Other | Source: Ambulatory Visit | Attending: Vascular Surgery | Admitting: Vascular Surgery

## 2018-02-13 ENCOUNTER — Encounter
Admission: RE | Disposition: A | Discharge status: Home / Self Care | Payer: Self-pay | Source: Ambulatory Visit | Attending: Vascular Surgery

## 2018-02-13 ENCOUNTER — Encounter: Payer: Self-pay | Admitting: *Deleted

## 2018-02-13 ENCOUNTER — Ambulatory Visit: Payer: Medicare Other | Admitting: Certified Registered"

## 2018-02-13 ENCOUNTER — Other Ambulatory Visit: Payer: Self-pay

## 2018-02-13 DIAGNOSIS — Z90710 Acquired absence of both cervix and uterus: Secondary | ICD-10-CM | POA: Insufficient documentation

## 2018-02-13 DIAGNOSIS — I129 Hypertensive chronic kidney disease with stage 1 through stage 4 chronic kidney disease, or unspecified chronic kidney disease: Secondary | ICD-10-CM | POA: Insufficient documentation

## 2018-02-13 DIAGNOSIS — K219 Gastro-esophageal reflux disease without esophagitis: Secondary | ICD-10-CM | POA: Insufficient documentation

## 2018-02-13 DIAGNOSIS — E1122 Type 2 diabetes mellitus with diabetic chronic kidney disease: Secondary | ICD-10-CM | POA: Insufficient documentation

## 2018-02-13 DIAGNOSIS — Z880 Allergy status to penicillin: Secondary | ICD-10-CM | POA: Insufficient documentation

## 2018-02-13 DIAGNOSIS — Z9049 Acquired absence of other specified parts of digestive tract: Secondary | ICD-10-CM | POA: Insufficient documentation

## 2018-02-13 DIAGNOSIS — E785 Hyperlipidemia, unspecified: Secondary | ICD-10-CM | POA: Insufficient documentation

## 2018-02-13 DIAGNOSIS — Z79899 Other long term (current) drug therapy: Secondary | ICD-10-CM | POA: Insufficient documentation

## 2018-02-13 DIAGNOSIS — Z87442 Personal history of urinary calculi: Secondary | ICD-10-CM | POA: Insufficient documentation

## 2018-02-13 DIAGNOSIS — Z794 Long term (current) use of insulin: Secondary | ICD-10-CM | POA: Insufficient documentation

## 2018-02-13 DIAGNOSIS — N184 Chronic kidney disease, stage 4 (severe): Secondary | ICD-10-CM | POA: Insufficient documentation

## 2018-02-13 HISTORY — PX: CAPD INSERTION: SHX5233

## 2018-02-13 LAB — GLUCOSE, CAPILLARY
GLUCOSE-CAPILLARY: 159 mg/dL — AB (ref 65–99)
Glucose-Capillary: 177 mg/dL — ABNORMAL HIGH (ref 65–99)

## 2018-02-13 SURGERY — LAPAROSCOPIC INSERTION CONTINUOUS AMBULATORY PERITONEAL DIALYSIS  (CAPD) CATHETER
Anesthesia: General | Wound class: "Clean Contaminated "

## 2018-02-13 MED ORDER — PROPOFOL 10 MG/ML IV BOLUS
INTRAVENOUS | Status: DC | PRN
  Administered 2018-02-13: 120 mg via INTRAVENOUS

## 2018-02-13 MED ORDER — CLINDAMYCIN PHOSPHATE 900 MG/50ML IV SOLN
INTRAVENOUS | Status: AC
  Filled 2018-02-13: qty 50

## 2018-02-13 MED ORDER — LABETALOL HCL 5 MG/ML IV SOLN
INTRAVENOUS | Status: DC | PRN
  Administered 2018-02-13 (×2): 10 mg via INTRAVENOUS

## 2018-02-13 MED ORDER — LABETALOL HCL 5 MG/ML IV SOLN
INTRAVENOUS | Status: AC
  Filled 2018-02-13: qty 4

## 2018-02-13 MED ORDER — HYDRALAZINE HCL 20 MG/ML IJ SOLN
10.0000 mg | Freq: Once | INTRAMUSCULAR | Status: AC
  Administered 2018-02-13: 10 mg via INTRAVENOUS

## 2018-02-13 MED ORDER — FENTANYL CITRATE (PF) 100 MCG/2ML IJ SOLN
INTRAMUSCULAR | Status: AC
  Filled 2018-02-13: qty 2

## 2018-02-13 MED ORDER — ONDANSETRON HCL 4 MG/2ML IJ SOLN: 4.0000 mg | Freq: Once | INTRAMUSCULAR | Status: DC | PRN

## 2018-02-13 MED ORDER — HYDROCODONE-ACETAMINOPHEN 5-325 MG PO TABS: 1.0000 | ORAL_TABLET | Freq: Four times a day (QID) | ORAL | 0 refills | Status: DC | PRN

## 2018-02-13 MED ORDER — ONDANSETRON HCL 4 MG/2ML IJ SOLN
INTRAMUSCULAR | Status: DC | PRN
  Administered 2018-02-13: 4 mg via INTRAVENOUS

## 2018-02-13 MED ORDER — SODIUM CHLORIDE 0.9 % IV SOLN
INTRAVENOUS | Status: DC
  Administered 2018-02-13: 13:00:00 via INTRAVENOUS

## 2018-02-13 MED ORDER — FENTANYL CITRATE (PF) 100 MCG/2ML IJ SOLN
INTRAMUSCULAR | Status: DC | PRN
  Administered 2018-02-13: 75 ug via INTRAVENOUS
  Administered 2018-02-13: 25 ug via INTRAVENOUS

## 2018-02-13 MED ORDER — CHLORHEXIDINE GLUCONATE CLOTH 2 % EX PADS: 6.0000 | MEDICATED_PAD | Freq: Once | CUTANEOUS | Status: DC

## 2018-02-13 MED ORDER — LIDOCAINE HCL (CARDIAC) PF 100 MG/5ML IV SOSY
PREFILLED_SYRINGE | INTRAVENOUS | Status: DC | PRN
  Administered 2018-02-13: 40 mg via INTRAVENOUS

## 2018-02-13 MED ORDER — HYDRALAZINE HCL 20 MG/ML IJ SOLN
INTRAMUSCULAR | Status: AC
  Filled 2018-02-13: qty 1

## 2018-02-13 MED ORDER — PROPOFOL 10 MG/ML IV BOLUS
INTRAVENOUS | Status: AC
  Filled 2018-02-13: qty 20

## 2018-02-13 MED ORDER — SUCCINYLCHOLINE CHLORIDE 20 MG/ML IJ SOLN
INTRAMUSCULAR | Status: DC | PRN
  Administered 2018-02-13: 100 mg via INTRAVENOUS

## 2018-02-13 MED ORDER — LABETALOL HCL 5 MG/ML IV SOLN
10.0000 mg | Freq: Once | INTRAVENOUS | Status: AC
  Administered 2018-02-13: 10 mg via INTRAVENOUS
  Filled 2018-02-13: qty 4

## 2018-02-13 MED ORDER — FENTANYL CITRATE (PF) 100 MCG/2ML IJ SOLN: 25.0000 ug | INTRAMUSCULAR | Status: DC | PRN

## 2018-02-13 MED ORDER — CHLORHEXIDINE GLUCONATE CLOTH 2 % EX PADS
6.0000 | MEDICATED_PAD | Freq: Once | CUTANEOUS | Status: AC
  Administered 2018-02-13: 6 via TOPICAL

## 2018-02-13 SURGICAL SUPPLY — 34 items
"PENCIL ELECTRO HAND CTR " (MISCELLANEOUS) ×1 IMPLANT
ADAPTER BETA CAP QUINTON DIALY (ADAPTER) IMPLANT
ADAPTER CATH DIALYSIS 18.75 (CATHETERS) ×2 IMPLANT
CANISTER SUCT 1200ML W/VALVE (MISCELLANEOUS) ×2 IMPLANT
CATH DLYS SWAN NECK 62.5CM (CATHETERS) ×2 IMPLANT
CHLORAPREP W/TINT 26ML (MISCELLANEOUS) ×2 IMPLANT
DERMABOND ADVANCED (GAUZE/BANDAGES/DRESSINGS) ×1
DERMABOND ADVANCED .7 DNX12 (GAUZE/BANDAGES/DRESSINGS) ×1 IMPLANT
ELECT CAUTERY BLADE 6.4 (BLADE) ×2 IMPLANT
ELECT REM PT RETURN 9FT ADLT (ELECTROSURGICAL) ×2
ELECTRODE REM PT RTRN 9FT ADLT (ELECTROSURGICAL) ×1 IMPLANT
GLOVE BIO SURGEON STRL SZ7 (GLOVE) ×4 IMPLANT
GLOVE INDICATOR 7.5 STRL GRN (GLOVE) ×2 IMPLANT
GOWN STRL REUS W/ TWL LRG LVL3 (GOWN DISPOSABLE) ×1 IMPLANT
GOWN STRL REUS W/ TWL XL LVL3 (GOWN DISPOSABLE) ×2 IMPLANT
GOWN STRL REUS W/TWL LRG LVL3 (GOWN DISPOSABLE) ×1
GOWN STRL REUS W/TWL XL LVL3 (GOWN DISPOSABLE) ×2
IV NS 500ML (IV SOLUTION) ×1
IV NS 500ML BAXH (IV SOLUTION) ×1 IMPLANT
KIT TURNOVER KIT A (KITS) ×2 IMPLANT
LABEL OR SOLS (LABEL) ×2 IMPLANT
MINICAP W/POVIDONE IODINE SOL (MISCELLANEOUS) ×2 IMPLANT
PACK LAP CHOLECYSTECTOMY (MISCELLANEOUS) ×2 IMPLANT
PENCIL ELECTRO HAND CTR (MISCELLANEOUS) ×2 IMPLANT
SET CYSTO W/LG BORE CLAMP LF (SET/KITS/TRAYS/PACK) ×2 IMPLANT
SET TRANSFER 6 W/TWIST CLAMP 5 (SET/KITS/TRAYS/PACK) ×2 IMPLANT
SPONGE DRAIN TRACH 4X4 STRL 2S (GAUZE/BANDAGES/DRESSINGS) ×2 IMPLANT
SPONGE VERSALON 4X4 4PLY (MISCELLANEOUS) ×2 IMPLANT
SUT MNCRL AB 4-0 PS2 18 (SUTURE) ×2 IMPLANT
SUT VIC AB 2-0 UR6 27 (SUTURE) ×2 IMPLANT
SUT VICRYL+ 3-0 36IN CT-1 (SUTURE) ×2 IMPLANT
TROCAR XCEL NON-BLD 11X100MML (ENDOMECHANICALS) ×2 IMPLANT
TROCAR XCEL NON-BLD 5MMX100MML (ENDOMECHANICALS) IMPLANT
TUBING INSUFFLATION (TUBING) ×2 IMPLANT

## 2018-02-13 NOTE — Anesthesia Post-op Follow-up Note (Signed)
Anesthesia QCDR form completed.        

## 2018-02-13 NOTE — Anesthesia Preprocedure Evaluation (Addendum)
Anesthesia Evaluation  Patient identified by MRN, date of birth, ID band Patient awake    Reviewed: Allergy & Precautions, NPO status , Patient's Chart, lab work & pertinent test results  History of Anesthesia Complications Negative for: history of anesthetic complications  Airway Mallampati: II       Dental   Pulmonary neg sleep apnea, neg COPD,           Cardiovascular hypertension, Pt. on medications (-) Past MI and (-) CHF (-) dysrhythmias      Neuro/Psych neg Seizures    GI/Hepatic GERD  Medicated and Controlled,  Endo/Other  diabetes, Type 2, Insulin Dependent  Renal/GU ESRFRenal disease     Musculoskeletal   Abdominal   Peds  Hematology   Anesthesia Other Findings   Reproductive/Obstetrics                             Anesthesia Physical Anesthesia Plan  ASA: IV  Anesthesia Plan: General   Post-op Pain Management:    Induction: Intravenous  PONV Risk Score and Plan: 3 and Ondansetron, Dexamethasone and Midazolam  Airway Management Planned: Oral ETT  Additional Equipment:   Intra-op Plan:   Post-operative Plan:   Informed Consent: I have reviewed the patients History and Physical, chart, labs and discussed the procedure including the risks, benefits and alternatives for the proposed anesthesia with the patient or authorized representative who has indicated his/her understanding and acceptance.     Plan Discussed with:   Anesthesia Plan Comments:         Anesthesia Quick Evaluation

## 2018-02-13 NOTE — Transfer of Care (Signed)
Immediate Anesthesia Transfer of Care Note  Patient: Catherine Burke  Procedure(s) Performed: LAPAROSCOPIC INSERTION CONTINUOUS AMBULATORY PERITONEAL DIALYSIS  (CAPD) CATHETER (N/A )  Patient Location: PACU  Anesthesia Type:General  Level of Consciousness: awake, alert  and oriented  Airway & Oxygen Therapy: Patient Spontanous Breathing  Post-op Assessment: Report given to RN and Post -op Vital signs reviewed and stable  Post vital signs: Reviewed and stable  Last Vitals:  Vitals Value Taken Time  BP 205/84 02/13/2018  2:43 PM  Temp    Pulse 65 02/13/2018  2:43 PM  Resp 14 02/13/2018  2:43 PM  SpO2 97 % 02/13/2018  2:43 PM  Vitals shown include unvalidated device data.  Last Pain:  Vitals:   02/13/18 1154  TempSrc: Oral  PainSc: 0-No pain         Complications: No apparent anesthesia complications

## 2018-02-13 NOTE — H&P (Signed)
[facility] History & Physical Update  The patient was interviewed and re-examined.  The patient's previous History and Physical has been reviewed and is unchanged.  There is no change in the plan of care. We plan to proceed with the scheduled procedure.  Leotis Pain, MD  02/13/2018, 12:47 PM

## 2018-02-13 NOTE — Progress Notes (Signed)
Blood pressure 205/87   Dr Andree Elk called  No new orders

## 2018-02-13 NOTE — Anesthesia Procedure Notes (Signed)
Procedure Name: Intubation Date/Time: 02/13/2018 1:58 PM Performed by: Philbert Riser, CRNA Pre-anesthesia Checklist: Patient identified, Emergency Drugs available, Suction available, Patient being monitored and Timeout performed Patient Re-evaluated:Patient Re-evaluated prior to induction Oxygen Delivery Method: Circle system utilized and Simple face mask Preoxygenation: Pre-oxygenation with 100% oxygen Induction Type: IV induction Ventilation: Mask ventilation without difficulty Laryngoscope Size: McGraph and 3 Grade View: Grade IV Tube type: Oral Tube size: 7.0 mm Number of attempts: 2 Airway Equipment and Method: Stylet Secured at: 20 cm Tube secured with: Tape Dental Injury: Teeth and Oropharynx as per pre-operative assessment  Difficulty Due To: Difficulty was unanticipated Future Recommendations: Recommend- induction with short-acting agent, and alternative techniques readily available

## 2018-02-13 NOTE — Progress Notes (Signed)
Blood pressure went up  To 220/83   Dr Andree Elk ordered hydralazine 10mg 

## 2018-02-13 NOTE — Op Note (Signed)
  OPERATIVE NOTE   PROCEDURE: 1. Laparoscopic peritoneal dialysis catheter placement.  PRE-OPERATIVE DIAGNOSIS: 1. CKD stage IV   POST-OPERATIVE DIAGNOSIS: Same  SURGEON: Leotis Pain, MD  ASSISTANT(S): None  ANESTHESIA: general  ESTIMATED BLOOD LOSS: Minimal   FINDING(S): 1. None  SPECIMEN(S): None  INDICATIONS:  Patient presents with advanced renal failure nearing dialysis dependence. The patient has decided to do peritoneal dialysis for his long-term dialysis. Risks and benefits of placement were discussed and he is agreeable to proceed.  Differences between peritoneal dialysis and hemodialysis were discussed.    DESCRIPTION: After obtaining full informed written consent, the patient was brought back to the operating room and placed supine upon the operating table. The patient received IV antibiotics prior to induction. After obtaining adequate anesthesia, the abdomen was prepped and draped in the standard fashion. A small transverse incision was created just to the left of the umbilicus and we dissected down to the fascia and placed a pursestring Vicryl suture. I then entered the peritoneum with an 23mm Optiview trocar placed in the right upper quadrant and insufflated the abdomen with carbon dioxide. I then entered the peritoneum just beside the umbilicus with a trocar and the peritoneal dialysis catheter under direct visualization. The coiled portion of the catheter was parked into the pelvis under direct laparoscopic guidance. The deep cuff was secured to the fascial pursestring suture. A small counterincision was made in the left abdomen and the catheter was brought out this site. The appropriate distal connectors were placed, and I then placed 500 cc of saline through the catheter into the pelvis. The abdomen was desufflated. Immediately, over 300 cc of effluent returned through the catheter when the bag was placed to gravity. I took one more look with the camera to ensure  that the catheter was in the pelvis and it was. The 43mm trocar was then removed. I then closed the incisions with 3-0 Vicryl and 4-0 Monocryl and placed Dermabond as dressing. Dry dressing was placed around the catheter exit site. The patient was then awakened from anesthesia and taken to the recovery room in stable condition having tolerated the procedure well.  COMPLICATIONS: None  CONDITION: None  Leotis Pain, MD 02/13/2018 2:35 PM   This note was created with Dragon Medical transcription system. Any errors in dictation are purely unintentional.

## 2018-02-13 NOTE — Discharge Instructions (Signed)

## 2018-02-14 ENCOUNTER — Encounter: Payer: Self-pay | Admitting: Vascular Surgery

## 2018-02-21 NOTE — Anesthesia Postprocedure Evaluation (Signed)
Anesthesia Post Note  Patient: Catherine Burke  Procedure(s) Performed: LAPAROSCOPIC INSERTION CONTINUOUS AMBULATORY PERITONEAL DIALYSIS  (CAPD) CATHETER (N/A )  Patient location during evaluation: PACU Anesthesia Type: General Level of consciousness: awake and alert Pain management: pain level controlled Vital Signs Assessment: post-procedure vital signs reviewed and stable Respiratory status: spontaneous breathing, nonlabored ventilation, respiratory function stable and patient connected to nasal cannula oxygen Cardiovascular status: blood pressure returned to baseline and stable Postop Assessment: no apparent nausea or vomiting Anesthetic complications: no     Last Vitals:  Vitals:   02/13/18 1546 02/13/18 1550  BP:  (!) 210/74  Pulse: 77   Resp: 20   Temp: (!) 36.3 C   SpO2:      Last Pain:  Vitals:   02/14/18 0847  TempSrc:   PainSc: 0-No pain                 Molli Barrows

## 2018-03-13 ENCOUNTER — Ambulatory Visit (INDEPENDENT_AMBULATORY_CARE_PROVIDER_SITE_OTHER): Payer: Medicare Other | Admitting: Vascular Surgery

## 2018-04-01 ENCOUNTER — Ambulatory Visit (INDEPENDENT_AMBULATORY_CARE_PROVIDER_SITE_OTHER): Payer: Medicare Other | Admitting: Vascular Surgery

## 2018-04-15 ENCOUNTER — Other Ambulatory Visit: Payer: Self-pay | Admitting: Nephrology

## 2018-04-15 ENCOUNTER — Ambulatory Visit
Admission: RE | Admit: 2018-04-15 | Discharge: 2018-04-15 | Disposition: A | Discharge status: Home / Self Care | Payer: Medicare Other | Source: Ambulatory Visit | Attending: Nephrology | Admitting: Nephrology

## 2018-04-15 DIAGNOSIS — R509 Fever, unspecified: Secondary | ICD-10-CM | POA: Diagnosis present

## 2018-04-15 DIAGNOSIS — N186 End stage renal disease: Secondary | ICD-10-CM

## 2018-04-15 DIAGNOSIS — Z992 Dependence on renal dialysis: Secondary | ICD-10-CM

## 2018-04-15 DIAGNOSIS — R918 Other nonspecific abnormal finding of lung field: Secondary | ICD-10-CM | POA: Diagnosis not present

## 2018-05-09 ENCOUNTER — Other Ambulatory Visit: Payer: Self-pay | Admitting: Internal Medicine

## 2018-05-09 DIAGNOSIS — J849 Interstitial pulmonary disease, unspecified: Secondary | ICD-10-CM

## 2018-05-15 ENCOUNTER — Ambulatory Visit
Admission: RE | Admit: 2018-05-15 | Discharge: 2018-05-15 | Disposition: A | Discharge status: Home / Self Care | Payer: Medicare Other | Source: Ambulatory Visit | Attending: Internal Medicine | Admitting: Internal Medicine

## 2018-05-15 DIAGNOSIS — R918 Other nonspecific abnormal finding of lung field: Secondary | ICD-10-CM | POA: Diagnosis not present

## 2018-05-15 DIAGNOSIS — I2510 Atherosclerotic heart disease of native coronary artery without angina pectoris: Secondary | ICD-10-CM | POA: Insufficient documentation

## 2018-05-15 DIAGNOSIS — I358 Other nonrheumatic aortic valve disorders: Secondary | ICD-10-CM | POA: Diagnosis not present

## 2018-05-15 DIAGNOSIS — J849 Interstitial pulmonary disease, unspecified: Secondary | ICD-10-CM | POA: Diagnosis present

## 2018-05-15 DIAGNOSIS — R188 Other ascites: Secondary | ICD-10-CM | POA: Insufficient documentation

## 2018-05-15 DIAGNOSIS — I700 Atherosclerosis of aorta: Secondary | ICD-10-CM | POA: Diagnosis not present

## 2018-05-15 DIAGNOSIS — K449 Diaphragmatic hernia without obstruction or gangrene: Secondary | ICD-10-CM | POA: Insufficient documentation

## 2018-06-04 ENCOUNTER — Ambulatory Visit: Payer: Medicare Other | Admitting: Cardiovascular Disease

## 2018-06-05 ENCOUNTER — Other Ambulatory Visit
Admission: RE | Admit: 2018-06-05 | Discharge: 2018-06-05 | Disposition: A | Discharge status: Home / Self Care | Payer: Medicare Other | Source: Ambulatory Visit | Attending: Pulmonary Disease | Admitting: Pulmonary Disease

## 2018-06-05 ENCOUNTER — Ambulatory Visit: Payer: Medicare Other | Admitting: Pulmonary Disease

## 2018-06-05 ENCOUNTER — Encounter: Payer: Self-pay | Admitting: Pulmonary Disease

## 2018-06-05 VITALS — BP 122/70 | HR 79 | Resp 16 | Ht 62.0 in | Wt 136.0 lb

## 2018-06-05 DIAGNOSIS — R918 Other nonspecific abnormal finding of lung field: Secondary | ICD-10-CM | POA: Diagnosis present

## 2018-06-05 NOTE — Progress Notes (Signed)
PULMONARY CONSULT NOTE  Requesting MD/Service: Doy Hutching Date of initial consultation: 06/05/18 Reason for consultation: Abnormal CT chest  PT PROFILE: 77 y.o. female never smoker with ESRD on PD found to have abnormal CXR which prompted CT chest and Pulmonary referral.   DATA: 05/15/18 CT chest: Multiple bilateral pulmonary nodules.  Partial consolidation of LLL.Non-contrast chest CT at 3-6 months is recommended.   INTERVAL:  HPI:  As above.  In August of this year, she had a low-grade fever and underwent a chest x-ray.  She was told that she had "pneumonia".  She required 2 rounds of antibiotics to recover back to her baseline.  Based on the chest x-ray findings, a CT scan of the chest was performed.  The results of this are noted above.  She is referred for further evaluation of the CT scan findings.  At this time she states that she "feels fine".  She denies exertional dyspnea.  She denies CP, fever, purulent sputum, hemoptysis, LE edema and calf tenderness.   She has been on peritoneal dialysis for 3 months.  This is going well and she has no problems with this.  She has never smoked.  She was previously employed as a Pharmacist, hospital.  She has no significant environmental or occupational exposures.  She has no significant travel history.  She has previously been tested for TB with all prior tests negative.  Past Medical History:  Diagnosis Date  . Arthritis   . Diabetes mellitus   . GERD (gastroesophageal reflux disease)   . HLD (hyperlipidemia)   . Hypercholesterolemia   . Hypertension   . Kidney stones   . Osteoporosis   . Proteinuria     Past Surgical History:  Procedure Laterality Date  . ABDOMINAL HYSTERECTOMY    . BACK SURGERY     lumbar  . BREAST CYST ASPIRATION     unsure of laterality   . CAPD INSERTION N/A 02/13/2018   Procedure: LAPAROSCOPIC INSERTION CONTINUOUS AMBULATORY PERITONEAL DIALYSIS  (CAPD) CATHETER;  Surgeon: Algernon Huxley, MD;  Location: [facility] ORS;  Service:  Vascular;  Laterality: N/A;  . CHOLECYSTECTOMY    . EYE SURGERY     bilateral cataract   . NASAL SEPTUM SURGERY      MEDICATIONS: I have reviewed all medications and confirmed regimen as documented  Social History   Socioeconomic History  . Marital status: Married    Spouse name: Not on file  . Number of children: Not on file  . Years of education: Not on file  . Highest education level: Not on file  Occupational History  . Not on file  Social Needs  . Financial resource strain: Not on file  . Food insecurity:    Worry: Not on file    Inability: Not on file  . Transportation needs:    Medical: Not on file    Non-medical: Not on file  Tobacco Use  . Smoking status: Never Smoker  . Smokeless tobacco: Never Used  Substance and Sexual Activity  . Alcohol use: No    Alcohol/week: 0.0 standard drinks  . Drug use: No  . Sexual activity: Yes  Lifestyle  . Physical activity:    Days per week: 0 days    Minutes per session: Not on file  . Stress: Not at all  Relationships  . Social connections:    Talks on phone: Once a week    Gets together: Once a week    Attends religious service: More than 4 times per  year    Active member of club or organization: Yes    Attends meetings of clubs or organizations: More than 4 times per year    Relationship status: Married  . Intimate partner violence:    Fear of current or ex partner: No    Emotionally abused: No    Physically abused: No    Forced sexual activity: No  Other Topics Concern  . Not on file  Social History Narrative  . Not on file    Family History  Problem Relation Age of Onset  . Other Mother   . Heart attack Father   . Breast cancer Paternal Aunt     ROS: No fever, myalgias/arthralgias, unexplained weight loss or weight gain No new focal weakness or sensory deficits No otalgia, hearing loss, visual changes, nasal and sinus symptoms, mouth and throat problems No neck pain or adenopathy No abdominal pain,  N/V/D, diarrhea, change in bowel pattern No dysuria, change in urinary pattern   Vitals:   06/05/18 1120 06/05/18 1124  BP:  122/70  Pulse:  79  Resp: 16   SpO2:  100%  Weight: 136 lb (61.7 kg)   Height: 5\' 2"  (1.575 m)   Room air   EXAM:  Gen: WDWN, No overt respiratory distress HEENT: NCAT, sclera white, oropharynx normal Neck: Supple without LAN, thyromegaly, JVD Lungs: breath sounds full, percussion normal, adventitious sounds: None Cardiovascular: RRR, no murmurs noted Abdomen: Soft, nontender, normal BS Ext: without clubbing, cyanosis, edema Neuro: CNs grossly intact, motor and sensory intact Skin: Limited exam, no lesions noted  DATA:   BMP Latest Ref Rng & Units 02/11/2018 10/13/2017 10/12/2017  Glucose 65 - 99 mg/dL 374(H) 115(H) 174(H)  BUN 6 - 20 mg/dL 65(H) 44(H) 45(H)  Creatinine 0.44 - 1.00 mg/dL 3.63(H) 3.12(H) 3.17(H)  Sodium 135 - 145 mmol/L 133(L) 142 143  Potassium 3.5 - 5.1 mmol/L 4.8 3.6 3.3(L)  Chloride 101 - 111 mmol/L 103 113(H) 112(H)  CO2 22 - 32 mmol/L 19(L) 20(L) 23  Calcium 8.9 - 10.3 mg/dL 8.2(L) 8.2(L) 8.2(L)    CBC Latest Ref Rng & Units 02/11/2018 10/12/2017 10/10/2017  WBC 3.6 - 11.0 K/uL 11.9(H) - 9.4  Hemoglobin 12.0 - 16.0 g/dL 9.5(L) 10.2(L) 10.1(L)  Hematocrit 35.0 - 47.0 % 29.2(L) - 30.5(L)  Platelets 150 - 440 K/uL 303 - 282    CXR 04/15/2018: Ill-defined opacities in RUL.  LUL nodule  I have personally reviewed all chest radiographs reported above including CXRs and CT chest unless otherwise indicated  IMPRESSION:     ICD-10-CM   1. Multiple pulmonary nodules R91.8 CT CHEST WO CONTRAST    QuantiFERON-TB Gold Plus  2. LLL infiltrate R91.8 CT CHEST WO CONTRAST    QuantiFERON-TB Gold Plus   The multiple pulmonary nodules have a benign appearance.  With regard to the LLL opacity, this could represent residual from a recent pneumonia.  However, it is notable that there is a CXR report (images not available) from 11/18/07 which  reportedly indicated left lower lobe scarring.  Overall, we are probably dealing with incidental findings that have been present for a long time and do not represent a worrisome or progressive problem.  PLAN:  QuantiFERON gold ordered for today Follow-up in 6 months with a repeat CT scan of the chest at that time She is instructed to call sooner for any breathing, chest, pulmonary, respiratory problems    Merton Border, MD PCCM service Mobile [phone] Pager [phone] 06/06/2018 10:42 AM

## 2018-06-05 NOTE — Patient Instructions (Signed)
TB blood test today (QuantiFERON gold) Repeat CT scan of chest in 6 months Follow-up in this office after repeat CT scan Call sooner for any breathing, chest or respiratory symptoms

## 2018-06-06 ENCOUNTER — Encounter: Payer: Self-pay | Admitting: Pulmonary Disease

## 2018-06-07 LAB — QUANTIFERON-TB GOLD PLUS (RQFGPL)
QUANTIFERON NIL VALUE: 0.04 [IU]/mL
QUANTIFERON TB1 AG VALUE: 0.09 [IU]/mL
QUANTIFERON TB2 AG VALUE: 0.1 [IU]/mL

## 2018-06-07 LAB — QUANTIFERON-TB GOLD PLUS: QuantiFERON-TB Gold Plus: NEGATIVE

## 2018-10-30 ENCOUNTER — Ambulatory Visit
Admission: RE | Admit: 2018-10-30 | Discharge: 2018-10-30 | Disposition: A | Discharge status: Home / Self Care | Payer: Medicare Other | Source: Ambulatory Visit | Attending: Nephrology | Admitting: Nephrology

## 2018-10-30 ENCOUNTER — Ambulatory Visit
Admission: RE | Admit: 2018-10-30 | Discharge: 2018-10-30 | Disposition: A | Discharge status: Home / Self Care | Payer: Medicare Other | Attending: Nephrology | Admitting: Nephrology

## 2018-10-30 ENCOUNTER — Other Ambulatory Visit: Payer: Self-pay | Admitting: Nephrology

## 2018-10-30 DIAGNOSIS — Z992 Dependence on renal dialysis: Secondary | ICD-10-CM

## 2019-02-10 ENCOUNTER — Other Ambulatory Visit: Payer: Self-pay | Admitting: Internal Medicine

## 2019-02-10 DIAGNOSIS — Z1231 Encounter for screening mammogram for malignant neoplasm of breast: Secondary | ICD-10-CM

## 2019-02-11 ENCOUNTER — Other Ambulatory Visit: Payer: Self-pay

## 2019-02-11 ENCOUNTER — Ambulatory Visit
Admission: RE | Admit: 2019-02-11 | Discharge: 2019-02-11 | Disposition: A | Discharge status: Home / Self Care | Payer: Medicare Other | Source: Ambulatory Visit | Attending: Pulmonary Disease | Admitting: Pulmonary Disease

## 2019-02-11 DIAGNOSIS — R918 Other nonspecific abnormal finding of lung field: Secondary | ICD-10-CM | POA: Diagnosis present

## 2019-02-18 ENCOUNTER — Telehealth: Payer: Self-pay | Admitting: Pulmonary Disease

## 2019-02-18 NOTE — Telephone Encounter (Signed)
Called patient for COVID-19 pre-screening for in office visit. ° °Have you recently traveled any where out of the local area in the last 2 weeks? No °Have you been in close contact with a person diagnosed with COVID-19 within the last 2 weeks? No ° °Do you currently have any of the following symptoms? If so, when did they start? °Cough     Diarrhea  Joint Pain °Fever      Muscle Pain  Red eyes °Shortness of breath   Abdominal pain Vomiting °Loss of smell    Rash   Sore Throat °Headache    Weakness  Bruising or bleeding ° ° °Okay to proceed with visit 02/19/2019 °  ° ° °

## 2019-02-19 ENCOUNTER — Ambulatory Visit (INDEPENDENT_AMBULATORY_CARE_PROVIDER_SITE_OTHER): Payer: Medicare Other | Admitting: Pulmonary Disease

## 2019-02-19 ENCOUNTER — Other Ambulatory Visit: Payer: Self-pay

## 2019-02-19 ENCOUNTER — Encounter: Payer: Self-pay | Admitting: Pulmonary Disease

## 2019-02-19 VITALS — BP 128/68 | HR 57 | Temp 98.1°F | Ht 65.0 in | Wt 148.0 lb

## 2019-02-19 DIAGNOSIS — R918 Other nonspecific abnormal finding of lung field: Secondary | ICD-10-CM

## 2019-02-19 DIAGNOSIS — J8410 Pulmonary fibrosis, unspecified: Secondary | ICD-10-CM

## 2019-02-19 NOTE — Progress Notes (Signed)
PULMONARY OFFICE FOLLOW-UP NOTE  Requesting MD/Service: Doy Hutching Date of initial consultation: 06/05/18 Reason for consultation: Abnormal CT chest  PT PROFILE: 78 y.o. female never smoker with ESRD on PD found to have abnormal CXR which prompted CT chest and Pulmonary referral.   DATA: 05/15/18 CT chest: Multiple bilateral pulmonary nodules.  Partial consolidation of LLL.Non-contrast chest CT at 3-6 months is recommended.  02/11/19 CT chest: Persistent but improved opacity in the left lower lobe and posterior lingula, which probably represents scarring. Multiple small bilateral pulmonary nodules measuring up to 5 mm in the right upper lobe are unchanged  INTERVAL: Initial consultation 06/05/2018.  No major events in the interim  SUBJ:  She is here to review results of repeat CT scan of the chest.  She has no major pulmonary or respiratory complaints.  Specifically, she denies fever, chest pain, cough, hemoptysis.  She has mild exertional dyspnea unchanged from previously.  She has chronic ankle edema, unchanged from previously.  She denies unexplained weight loss.   Vitals:   02/19/19 1026 02/19/19 1033  BP:  128/68  Pulse:  (!) 57  Temp:  98.1 F (36.7 C)  TempSrc:  Temporal  SpO2:  100%  Weight: 148 lb (67.1 kg)   Height: 5\' 5"  (1.651 m)   Room air   EXAM:  Gen: NAD HEENT: NCAT, sclerae white Neck: No JVD Lungs: breath sounds full, no wheezes or other adventitious sounds Cardiovascular: RRR, no murmurs Abdomen: Soft, nontender, normal BS Ext: without clubbing, cyanosis.  Trace symmetric ankle edema Neuro: grossly intact Skin: Limited exam, no lesions noted   DATA:   BMP Latest Ref Rng & Units 02/11/2018 10/13/2017 10/12/2017  Glucose 65 - 99 mg/dL 374(H) 115(H) 174(H)  BUN 6 - 20 mg/dL 65(H) 44(H) 45(H)  Creatinine 0.44 - 1.00 mg/dL 3.63(H) 3.12(H) 3.17(H)  Sodium 135 - 145 mmol/L 133(L) 142 143  Potassium 3.5 - 5.1 mmol/L 4.8 3.6 3.3(L)  Chloride 101 - 111 mmol/L 103  113(H) 112(H)  CO2 22 - 32 mmol/L 19(L) 20(L) 23  Calcium 8.9 - 10.3 mg/dL 8.2(L) 8.2(L) 8.2(L)    CBC Latest Ref Rng & Units 02/11/2018 10/12/2017 10/10/2017  WBC 3.6 - 11.0 K/uL 11.9(H) - 9.4  Hemoglobin 12.0 - 16.0 g/dL 9.5(L) 10.2(L) 10.1(L)  Hematocrit 35.0 - 47.0 % 29.2(L) - 30.5(L)  Platelets 150 - 440 K/uL 303 - 282    CXR: No new film  I have personally reviewed all chest radiographs reported above including CXRs and CT chest unless otherwise indicated  IMPRESSION:     ICD-10-CM   1. Pulmonary nodules  R91.8   2. 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  QuantiFERON gold is negative.  There is no evidence of progression in either size or number of the nodules.  Left lower lobe changes probably represent postinfectious scarring  PLAN:  We reviewed the CT scan together.  I explained to her my interpretation as documented above.  I emphasized that these changes on her x-ray are likely permanent.  Therefore, future chest x-rays will have to be compared to priors to be accurately interpreted.  Otherwise, every time she undergoes chest x-ray concern will be raised for an acute pneumonia.  No further evaluation or follow-up is indicated at this time.    Merton Border, MD PCCM service Mobile [phone] Pager [phone] 02/19/2019 10:51 AM

## 2019-02-19 NOTE — Patient Instructions (Signed)
The CT scan of your chest looks somewhat improved compared to the previous scan.  There is some residual scarring in the lower part of your left lung and a couple of small nodules that have not changed.  These findings are probably residual changes due to previous infections at some point in your life.  I am confident that they do not represent cancer and they do not require any further evaluation.  In the future if you get chest x-rays performed, there will likely be some abnormalities that persist and it will be important to compare to previous chest x-rays.  No further follow-up is indicated.  Please call back as needed for any breathing, chest, or respiratory symptoms.

## 2019-06-04 ENCOUNTER — Ambulatory Visit
Admission: RE | Admit: 2019-06-04 | Discharge: 2019-06-04 | Disposition: A | Discharge status: Home / Self Care | Payer: Medicare Other | Source: Ambulatory Visit | Attending: Internal Medicine | Admitting: Internal Medicine

## 2019-06-04 DIAGNOSIS — Z1231 Encounter for screening mammogram for malignant neoplasm of breast: Secondary | ICD-10-CM | POA: Insufficient documentation

## 2020-04-28 ENCOUNTER — Ambulatory Visit
Admission: RE | Admit: 2020-04-28 | Discharge: 2020-04-28 | Disposition: A | Discharge status: Home / Self Care | Payer: Medicare PPO | Attending: Nephrology | Admitting: Nephrology

## 2020-04-28 ENCOUNTER — Ambulatory Visit
Admission: RE | Admit: 2020-04-28 | Discharge: 2020-04-28 | Disposition: A | Discharge status: Home / Self Care | Payer: Medicare PPO | Source: Ambulatory Visit | Attending: Nephrology | Admitting: Nephrology

## 2020-04-28 ENCOUNTER — Other Ambulatory Visit: Payer: Self-pay

## 2020-04-28 ENCOUNTER — Other Ambulatory Visit: Payer: Self-pay | Admitting: Nephrology

## 2020-04-28 DIAGNOSIS — R918 Other nonspecific abnormal finding of lung field: Secondary | ICD-10-CM

## 2020-06-15 ENCOUNTER — Other Ambulatory Visit: Payer: Self-pay | Admitting: Internal Medicine

## 2020-06-15 DIAGNOSIS — Z1231 Encounter for screening mammogram for malignant neoplasm of breast: Secondary | ICD-10-CM

## 2020-07-21 ENCOUNTER — Other Ambulatory Visit: Payer: Self-pay

## 2020-07-21 ENCOUNTER — Emergency Department (HOSPITAL_COMMUNITY): Payer: Medicare PPO

## 2020-07-21 ENCOUNTER — Inpatient Hospital Stay (HOSPITAL_COMMUNITY)
Admission: EM | Admit: 2020-07-21 | Discharge: 2020-07-25 | DRG: 682 | Disposition: A | Discharge status: Home / Self Care | Payer: Medicare PPO | Attending: Internal Medicine | Admitting: Internal Medicine

## 2020-07-21 ENCOUNTER — Encounter (HOSPITAL_COMMUNITY): Payer: Self-pay | Admitting: Emergency Medicine

## 2020-07-21 DIAGNOSIS — R001 Bradycardia, unspecified: Secondary | ICD-10-CM

## 2020-07-21 DIAGNOSIS — E119 Type 2 diabetes mellitus without complications: Secondary | ICD-10-CM

## 2020-07-21 DIAGNOSIS — E860 Dehydration: Secondary | ICD-10-CM | POA: Diagnosis present

## 2020-07-21 DIAGNOSIS — Z87892 Personal history of anaphylaxis: Secondary | ICD-10-CM

## 2020-07-21 DIAGNOSIS — Z880 Allergy status to penicillin: Secondary | ICD-10-CM

## 2020-07-21 DIAGNOSIS — Z79899 Other long term (current) drug therapy: Secondary | ICD-10-CM

## 2020-07-21 DIAGNOSIS — R42 Dizziness and giddiness: Secondary | ICD-10-CM

## 2020-07-21 DIAGNOSIS — Z87442 Personal history of urinary calculi: Secondary | ICD-10-CM

## 2020-07-21 DIAGNOSIS — Z8249 Family history of ischemic heart disease and other diseases of the circulatory system: Secondary | ICD-10-CM

## 2020-07-21 DIAGNOSIS — Z794 Long term (current) use of insulin: Secondary | ICD-10-CM

## 2020-07-21 DIAGNOSIS — D649 Anemia, unspecified: Secondary | ICD-10-CM | POA: Diagnosis present

## 2020-07-21 DIAGNOSIS — N2581 Secondary hyperparathyroidism of renal origin: Secondary | ICD-10-CM | POA: Diagnosis present

## 2020-07-21 DIAGNOSIS — E876 Hypokalemia: Secondary | ICD-10-CM | POA: Diagnosis present

## 2020-07-21 DIAGNOSIS — M810 Age-related osteoporosis without current pathological fracture: Secondary | ICD-10-CM | POA: Diagnosis present

## 2020-07-21 DIAGNOSIS — Z992 Dependence on renal dialysis: Secondary | ICD-10-CM

## 2020-07-21 DIAGNOSIS — Z20822 Contact with and (suspected) exposure to covid-19: Secondary | ICD-10-CM | POA: Diagnosis present

## 2020-07-21 DIAGNOSIS — D631 Anemia in chronic kidney disease: Secondary | ICD-10-CM | POA: Diagnosis present

## 2020-07-21 DIAGNOSIS — I120 Hypertensive chronic kidney disease with stage 5 chronic kidney disease or end stage renal disease: Principal | ICD-10-CM | POA: Diagnosis present

## 2020-07-21 DIAGNOSIS — N186 End stage renal disease: Secondary | ICD-10-CM | POA: Diagnosis present

## 2020-07-21 DIAGNOSIS — I10 Essential (primary) hypertension: Secondary | ICD-10-CM | POA: Diagnosis present

## 2020-07-21 DIAGNOSIS — K219 Gastro-esophageal reflux disease without esophagitis: Secondary | ICD-10-CM | POA: Diagnosis present

## 2020-07-21 DIAGNOSIS — M1990 Unspecified osteoarthritis, unspecified site: Secondary | ICD-10-CM | POA: Diagnosis present

## 2020-07-21 DIAGNOSIS — M8580 Other specified disorders of bone density and structure, unspecified site: Secondary | ICD-10-CM | POA: Diagnosis present

## 2020-07-21 DIAGNOSIS — E785 Hyperlipidemia, unspecified: Secondary | ICD-10-CM | POA: Diagnosis present

## 2020-07-21 DIAGNOSIS — E1122 Type 2 diabetes mellitus with diabetic chronic kidney disease: Secondary | ICD-10-CM | POA: Diagnosis present

## 2020-07-21 DIAGNOSIS — R55 Syncope and collapse: Secondary | ICD-10-CM

## 2020-07-21 DIAGNOSIS — N390 Urinary tract infection, site not specified: Secondary | ICD-10-CM | POA: Diagnosis present

## 2020-07-21 DIAGNOSIS — N189 Chronic kidney disease, unspecified: Secondary | ICD-10-CM

## 2020-07-21 DIAGNOSIS — M898X9 Other specified disorders of bone, unspecified site: Secondary | ICD-10-CM | POA: Diagnosis present

## 2020-07-21 DIAGNOSIS — R509 Fever, unspecified: Secondary | ICD-10-CM

## 2020-07-21 LAB — URINALYSIS, ROUTINE W REFLEX MICROSCOPIC
Bilirubin Urine: NEGATIVE
Glucose, UA: NEGATIVE mg/dL
Ketones, ur: NEGATIVE mg/dL
Nitrite: NEGATIVE
Protein, ur: 30 mg/dL — AB
Specific Gravity, Urine: 1.012 (ref 1.005–1.030)
pH: 5 (ref 5.0–8.0)

## 2020-07-21 LAB — BASIC METABOLIC PANEL
Anion gap: 10 (ref 5–15)
BUN: 42 mg/dL — ABNORMAL HIGH (ref 8–23)
CO2: 27 mmol/L (ref 22–32)
Calcium: 7.8 mg/dL — ABNORMAL LOW (ref 8.9–10.3)
Chloride: 99 mmol/L (ref 98–111)
Creatinine, Ser: 3.74 mg/dL — ABNORMAL HIGH (ref 0.44–1.00)
GFR, Estimated: 12 mL/min — ABNORMAL LOW (ref >60)
Glucose, Bld: 119 mg/dL — ABNORMAL HIGH (ref 70–99)
Potassium: 3.1 mmol/L — ABNORMAL LOW (ref 3.5–5.1)
Sodium: 136 mmol/L (ref 135–145)

## 2020-07-21 LAB — CBC
HCT: 22.6 % — ABNORMAL LOW (ref 36.0–46.0)
Hemoglobin: 7 g/dL — ABNORMAL LOW (ref 12.0–15.0)
MCH: 26.9 pg (ref 26.0–34.0)
MCHC: 31 g/dL (ref 30.0–36.0)
MCV: 86.9 fL (ref 80.0–100.0)
Platelets: 294 10*3/uL (ref 150–400)
RBC: 2.6 MIL/uL — ABNORMAL LOW (ref 3.87–5.11)
RDW: 15.7 % — ABNORMAL HIGH (ref 11.5–15.5)
WBC: 7.4 10*3/uL (ref 4.0–10.5)
nRBC: 0 % (ref 0.0–0.2)

## 2020-07-21 LAB — IRON AND TIBC
Iron: 21 ug/dL — ABNORMAL LOW (ref 28–170)
Saturation Ratios: 9 % — ABNORMAL LOW (ref 10.4–31.8)
TIBC: 226 ug/dL — ABNORMAL LOW (ref 250–450)
UIBC: 205 ug/dL

## 2020-07-21 LAB — RETICULOCYTES
Immature Retic Fract: 9 % (ref 2.3–15.9)
RBC.: 2.61 MIL/uL — ABNORMAL LOW (ref 3.87–5.11)
Retic Count, Absolute: 27.1 10*3/uL (ref 19.0–186.0)
Retic Ct Pct: 1 % (ref 0.4–3.1)

## 2020-07-21 LAB — RESPIRATORY PANEL BY RT PCR (FLU A&B, COVID)
Influenza A by PCR: NEGATIVE
Influenza B by PCR: NEGATIVE
SARS Coronavirus 2 by RT PCR: NEGATIVE

## 2020-07-21 LAB — CBG MONITORING, ED
Glucose-Capillary: 128 mg/dL — ABNORMAL HIGH (ref 70–99)
Glucose-Capillary: 79 mg/dL (ref 70–99)

## 2020-07-21 LAB — VITAMIN B12: Vitamin B-12: 228 pg/mL (ref 180–914)

## 2020-07-21 LAB — PREPARE RBC (CROSSMATCH)

## 2020-07-21 LAB — POC OCCULT BLOOD, ED: Fecal Occult Bld: NEGATIVE

## 2020-07-21 LAB — FOLATE: Folate: 9.3 ng/mL (ref >5.9)

## 2020-07-21 LAB — FERRITIN: Ferritin: 697 ng/mL — ABNORMAL HIGH (ref 11–307)

## 2020-07-21 MED ORDER — CLONIDINE HCL 0.1 MG PO TABS
0.1000 mg | ORAL_TABLET | Freq: Three times a day (TID) | ORAL | Status: DC
  Administered 2020-07-22 – 2020-07-25 (×11): 0.1 mg via ORAL
  Filled 2020-07-21 (×10): qty 1

## 2020-07-21 MED ORDER — SODIUM CHLORIDE 0.9 % IV SOLN
1.0000 g | Freq: Once | INTRAVENOUS | Status: AC
  Administered 2020-07-21: 1 g via INTRAVENOUS
  Filled 2020-07-21: qty 10

## 2020-07-21 MED ORDER — SODIUM CHLORIDE 0.9 % IV SOLN
10.0000 mL/h | Freq: Once | INTRAVENOUS | Status: AC
  Administered 2020-07-21: 10 mL/h via INTRAVENOUS

## 2020-07-21 MED ORDER — POTASSIUM CHLORIDE 20 MEQ PO PACK
20.0000 meq | PACK | Freq: Once | ORAL | Status: AC
  Administered 2020-07-22: 20 meq via ORAL
  Filled 2020-07-21: qty 1

## 2020-07-21 MED ORDER — HYDRALAZINE HCL 25 MG PO TABS
ORAL_TABLET | ORAL | Status: AC
  Filled 2020-07-21: qty 2

## 2020-07-21 MED ORDER — HYDRALAZINE HCL 20 MG/ML IJ SOLN
5.0000 mg | Freq: Once | INTRAMUSCULAR | Status: DC
  Filled 2020-07-21 (×2): qty 1

## 2020-07-21 MED ORDER — HYDRALAZINE HCL 50 MG PO TABS
50.0000 mg | ORAL_TABLET | Freq: Three times a day (TID) | ORAL | Status: DC
  Administered 2020-07-22 – 2020-07-25 (×9): 50 mg via ORAL
  Filled 2020-07-21 (×16): qty 1

## 2020-07-21 MED ORDER — SODIUM CHLORIDE 0.9 % IV BOLUS
1000.0000 mL | Freq: Once | INTRAVENOUS | Status: AC
  Administered 2020-07-21: 1000 mL via INTRAVENOUS

## 2020-07-21 NOTE — ED Notes (Signed)
Called AC for po hydralazine.

## 2020-07-21 NOTE — ED Provider Notes (Addendum)
[facility] EMERGENCY DEPARTMENT Provider Note   CSN: [id number] Arrival date & time: 07/21/20  1225     History Chief Complaint  Patient presents with  . Dizziness    GERMANI GAVILANES is a 79 y.o. female.  Patient with hx CKD on home PD, c/o dizziness acute onset 6 days ago. States 6 days ago onset nausea and vomiting (emesis clear or yellow, not bloody or bilious), and dizziness. Dizziness describe as unsteadiness, mild room movement sensation, worse w rapid change in position. No syncope. Denies ear pain, tinnitus or hearing loss. No hx vertigo. No uri symptoms. Denies any change in speech or vision. No one-sided numbness or weakness. No fever or chills. No abd pain. No diarrhea. Denies change in meds or new meds. No headache. In past few days, nv resolved, but dizziness/unsteadiness persists. Pt also states feels as if she may be dehydrated, with concentrated appearing urine and dry mouth.   The history is provided by the patient.  Dizziness Associated symptoms: no chest pain, no headaches, no hearing loss, no nausea, no palpitations, no shortness of breath, no tinnitus and no vomiting        Past Medical History:  Diagnosis Date  . Arthritis   . Diabetes mellitus   . GERD (gastroesophageal reflux disease)   . HLD (hyperlipidemia)   . Hypercholesterolemia   . Hypertension   . Kidney stones   . Osteoporosis   . Proteinuria     Patient Active Problem List   Diagnosis Date Noted  . Chronic kidney disease (CKD), stage IV (severe) ([place]) 02/08/2018  . Proteinuria 10/10/2017  . Chronic kidney disease (CKD), stage III (moderate) ([place]) 02/24/2015  . Kidney stones   . Osteoporosis   . GERD (gastroesophageal reflux disease)   . Diabetes mellitus ([place]) 01/17/2007  . Hyperlipidemia 01/17/2007  . Essential hypertension 01/17/2007  . OSTEOPENIA 01/17/2007  . HYSTERECTOMY, PARTIAL, HX OF 01/17/2007    Past Surgical History:  Procedure Laterality Date  . ABDOMINAL  HYSTERECTOMY    . BACK SURGERY     lumbar  . BREAST CYST ASPIRATION     unsure of laterality   . CAPD INSERTION N/A 02/13/2018   Procedure: LAPAROSCOPIC INSERTION CONTINUOUS AMBULATORY PERITONEAL DIALYSIS  (CAPD) CATHETER;  Surgeon: Algernon Huxley, MD;  Location: [facility] ORS;  Service: Vascular;  Laterality: N/A;  . CHOLECYSTECTOMY    . EYE SURGERY     bilateral cataract   . NASAL SEPTUM SURGERY       OB History   No obstetric history on file.     Family History  Problem Relation Age of Onset  . Other Mother   . Heart attack Father   . Breast cancer Paternal Aunt     Social History   Tobacco Use  . Smoking status: Never Smoker  . Smokeless tobacco: Never Used  Vaping Use  . Vaping Use: Never used  Substance Use Topics  . Alcohol use: No    Alcohol/week: 0.0 standard drinks  . Drug use: No    Home Medications Prior to Admission medications   Medication Sig Start Date End Date Taking? Authorizing Provider  ACCU-CHEK AVIVA PLUS test strip TESTING TWICE DAILY. 08/11/14   Susy Frizzle, MD  atorvastatin (LIPITOR) 20 MG tablet Take 1 tablet (20 mg total) by mouth daily. Patient taking differently: Take 10 mg by mouth daily.  06/03/15   Alycia Rossetti, MD  benazepril (LOTENSIN) 20 MG tablet Take by mouth. 08/06/18   [provider]  cloNIDine (CATAPRES) 0.1 MG tablet TK 1 T PO QD WITH DINNER 05/21/18   [provider]  hydrALAZINE (APRESOLINE) 100 MG tablet Take 1 tablet (100 mg total) by mouth every 8 (eight) hours. Patient taking differently: Take 50 mg by mouth every 8 (eight) hours.  10/13/17 02/13/18  Henreitta Leber, MD  HYDROcodone-acetaminophen (NORCO) 5-325 MG tablet Take 1 tablet by mouth every 6 (six) hours as needed for moderate pain. 02/13/18   Algernon Huxley, MD  Insulin Glargine (LANTUS SOLOSTAR) 100 UNIT/ML Solostar Pen Inject 50-60 units at bedtime as directed Patient taking differently: Inject 20 Units into the skin daily at 10 pm. Inject 50-60  units at bedtime as directed 02/24/15   Alycia Rossetti, MD  Insulin Glargine (LANTUS SOLOSTAR) 100 UNIT/ML Solostar Pen INJECT 20 UNITS UNDER THE SKIN EVERY NIGHT AT BEDTIME 12/11/18   [provider]  Insulin Pen Needle 31G X 5 MM MISC As directed 03/31/15   Alycia Rossetti, MD  NIFEdipine (ADALAT CC) 60 MG 24 hr tablet TAKE 1 TABLET(60 MG) BY MOUTH TWICE DAILY 03/28/18   [provider]  omeprazole (PRILOSEC) 40 MG capsule Take 40 mg by mouth daily.    [provider]  sennosides-docusate sodium (SENOKOT-S) 8.6-50 MG tablet Take 1 tablet by mouth daily.    [provider]  spironolactone (ALDACTONE) 25 MG tablet Take 1 tablet (25 mg total) by mouth daily. 10/14/17 02/13/18  Henreitta Leber, MD  torsemide (DEMADEX) 20 MG tablet Take 20 mg by mouth 2 (two) times daily.    [provider]  Vitamin D, Ergocalciferol, (DRISDOL) 1.25 MG (50000 UT) CAPS capsule Take by mouth. 08/06/18   [provider]    Allergies    Penicillins  Review of Systems   Review of Systems  Constitutional: Negative for chills and fever.  HENT: Negative for congestion, ear pain, hearing loss, sore throat, tinnitus and trouble swallowing.   Eyes: Negative for visual disturbance.  Respiratory: Negative for cough and shortness of breath.   Cardiovascular: Negative for chest pain, palpitations and leg swelling.  Gastrointestinal: Negative for abdominal pain, nausea and vomiting.  Endocrine: Negative for polyuria.  Genitourinary: Negative for dysuria and flank pain.  Musculoskeletal: Negative for back pain and neck pain.  Skin: Negative for rash.  Neurological: Positive for dizziness. Negative for syncope, speech difficulty, numbness and headaches.  Hematological: Does not bruise/bleed easily.  Psychiatric/Behavioral: Negative for confusion.    Physical Exam Updated Vital Signs BP (!) 168/64 (BP Location: Right Arm)   Pulse (!) 52   Temp 98.1 F (36.7 C) (Oral)    Resp 16   Ht 1.6 m (5\' 3" )   Wt 60.3 kg   SpO2 99%   BMI 23.56 kg/m   Physical Exam Vitals and nursing note reviewed.  Constitutional:      Appearance: Normal appearance. She is well-developed.  HENT:     Head: Atraumatic.     Right Ear: Tympanic membrane normal.     Left Ear: Tympanic membrane normal.     Nose: Nose normal.     Mouth/Throat:     Mouth: Mucous membranes are moist.  Eyes:     General: No scleral icterus.    Extraocular Movements: Extraocular movements intact.     Conjunctiva/sclera: Conjunctivae normal.     Pupils: Pupils are equal, round, and reactive to light.  Neck:     Vascular: No carotid bruit.     Trachea: No tracheal  deviation.  Cardiovascular:     Rate and Rhythm: Regular rhythm. Bradycardia present.     Pulses: Normal pulses.     Heart sounds: Normal heart sounds. No murmur heard.  No friction rub. No gallop.   Pulmonary:     Effort: Pulmonary effort is normal. No respiratory distress.     Breath sounds: Normal breath sounds.  Abdominal:     General: Bowel sounds are normal. There is no distension.     Palpations: Abdomen is soft.     Tenderness: There is no abdominal tenderness. There is no guarding.  Genitourinary:    Comments: No cva tenderness. Light brown stool on rectal exam, heme negative.  Musculoskeletal:        General: No swelling or tenderness.     Cervical back: Normal range of motion and neck supple. No rigidity. No muscular tenderness.     Right lower leg: No edema.     Left lower leg: No edema.  Skin:    General: Skin is warm and dry.     Findings: No rash.  Neurological:     Mental Status: She is alert.     Cranial Nerves: No cranial nerve deficit.     Comments: Alert, speech normal. No dysarthria or aphasia. Motor intact bil, stre 5/5. No pronator drift. Sensation grossly intact bil. No nystagmus.   Psychiatric:        Mood and Affect: Mood normal.     ED Results / Procedures / Treatments   Labs (all labs ordered  are listed, but only abnormal results are displayed) Results for orders placed or performed during the hospital encounter of 77/82/42  Basic metabolic panel  Result Value Ref Range   Sodium 136 135 - 145 mmol/L   Potassium 3.1 (L) 3.5 - 5.1 mmol/L   Chloride 99 98 - 111 mmol/L   CO2 27 22 - 32 mmol/L   Glucose, Bld 119 (H) 70 - 99 mg/dL   BUN 42 (H) 8 - 23 mg/dL   Creatinine, Ser 3.74 (H) 0.44 - 1.00 mg/dL   Calcium 7.8 (L) 8.9 - 10.3 mg/dL   GFR, Estimated 12 (L) >60 mL/min   Anion gap 10 5 - 15  CBC  Result Value Ref Range   WBC 7.4 4.0 - 10.5 K/uL   RBC 2.60 (L) 3.87 - 5.11 MIL/uL   Hemoglobin 7.0 (L) 12.0 - 15.0 g/dL   HCT 22.6 (L) 36 - 46 %   MCV 86.9 80.0 - 100.0 fL   MCH 26.9 26.0 - 34.0 pg   MCHC 31.0 30.0 - 36.0 g/dL   RDW 15.7 (H) 11.5 - 15.5 %   Platelets 294 150 - 400 K/uL   nRBC 0.0 0.0 - 0.2 %  Urinalysis, Routine w reflex microscopic  Result Value Ref Range   Color, Urine YELLOW YELLOW   APPearance CLEAR CLEAR   Specific Gravity, Urine 1.012 1.005 - 1.030   pH 5.0 5.0 - 8.0   Glucose, UA NEGATIVE NEGATIVE mg/dL   Hgb urine dipstick SMALL (A) NEGATIVE   Bilirubin Urine NEGATIVE NEGATIVE   Ketones, ur NEGATIVE NEGATIVE mg/dL   Protein, ur 30 (A) NEGATIVE mg/dL   Nitrite NEGATIVE NEGATIVE   Leukocytes,Ua SMALL (A) NEGATIVE   RBC / HPF 11-20 0 - 5 RBC/hpf   WBC, UA 21-50 0 - 5 WBC/hpf   Bacteria, UA RARE (A) NONE SEEN   Squamous Epithelial / LPF 0-5 0 - 5  Folate  Result Value Ref Range  Folate 9.3 >5.9 ng/mL  Reticulocytes  Result Value Ref Range   Retic Ct Pct 1.0 0.4 - 3.1 %   RBC. 2.61 (L) 3.87 - 5.11 MIL/uL   Retic Count, Absolute 27.1 19.0 - 186.0 K/uL   Immature Retic Fract 9.0 2.3 - 15.9 %  CBG monitoring, ED  Result Value Ref Range   Glucose-Capillary 128 (H) 70 - 99 mg/dL  CBG monitoring, ED  Result Value Ref Range   Glucose-Capillary 79 70 - 99 mg/dL  POC occult blood, ED Provider will collect  Result Value Ref Range   Fecal Occult  Bld NEGATIVE NEGATIVE  Type and screen  Result Value Ref Range   ABO/RH(D) A POS    Antibody Screen NEG    Sample Expiration      07/24/2020,2359 Performed at [facility], [address], [place], [place] [address]    EKG EKG Interpretation  Date/Time:  Wednesday July 21 2020 12:54:56 EST Ventricular Rate:  55 PR Interval:    QRS Duration: 84 QT Interval:  480 QTC Calculation: 459 R Axis:   20 Text Interpretation: Sinus rhythm Non-specific ST-t changes Confirmed by Lajean Saver [phone]) on 07/21/2020 5:58:52 PM   Radiology No results found.  Procedures Procedures (including critical care time)  Medications Ordered in ED Medications  sodium chloride 0.9 % bolus 1,000 mL (has no administration in time range)    ED Course  I have reviewed the triage vital signs and the nursing notes.  Pertinent labs & imaging results that were available during my care of the patient were reviewed by me and considered in my medical decision making (see chart for details).    MDM Rules/Calculators/A&P                         Iv ns. Labs. MRI.  Patient refuses MRI. Indicates has tried atleast four times in past, including with meds, and states there is no way she can tolerate it. I discussed reasons for MRI, including possible cva, and discussed iv meds to help - pt refuses MRI or any attempt at getting MRI despite RN and myself discussing with her.   Reviewed nursing notes and prior charts for additional history. Recent labs at [facility] reviewed.   Labs reviewed/interpreted by me - k mildly low, 3.1. WBC normal. Hgb is low, 7 - in Care Everywhere, last CBC in [facility] system, 06/2020, hgb 10.7. anemia panel added. Hemoccult stool.  Pt denies hx transfusion, states does get EPO shots prn. Reaffirms no blood loss or melena.   Given persistent/recurrent near syncope, several gram decrease in hgb, possible uti (u cx pending), etc, will transfuse, and admit/obs.   CRITICAL CARE RE: symptomatic  anemia with near syncope/syncope, w 3+ gm drop in Hgb, requiring emergent PRBC transfusion/admission Performed by: Mirna Mires Total critical care time: 35 minutes Critical care time was exclusive of separately billable procedures and treating other patients. Critical care was necessary to treat or prevent imminent or life-threatening deterioration. Critical care was time spent personally by me on the following activities: development of treatment plan with patient and/or surrogate as well as nursing, discussions with consultants, evaluation of patient's response to treatment, examination of patient, obtaining history from patient or surrogate, ordering and performing treatments and interventions, ordering and review of laboratory studies, ordering and review of radiographic studies, pulse oximetry and re-evaluation of patient's condition.   Hospitalist consulted for admission.      Final Clinical Impression(s) / ED  Diagnoses Final diagnoses:  None    Rx / DC Orders ED Discharge Orders    None           Lajean Saver, MD 08/02/20 [phone]

## 2020-07-21 NOTE — ED Triage Notes (Signed)
Pt states she is experiencing dizziness and that she is dehydrated. Pt had N/V last Friday that has now resolved. Pt performs home dialysis.

## 2020-07-21 NOTE — ED Notes (Signed)
Patient assisted to restroom via wheelchair. Patient has unsteady gait. Patient urinated at this time. Patient assisted back to bed.

## 2020-07-21 NOTE — H&P (Signed)
History and Physical    Catherine Burke DDU:[id number] DOB: 1941-03-29 DOA: 07/21/2020  PCP: Idelle Crouch, MD  \ Patient coming from: Home  I have personally briefly reviewed patient's old medical records in [place]  Chief Complaint: Lightheadedness  HPI: Catherine Burke is a 79 y.o. female with medical history significant for diabetes mellitus, hypertension, ESRD. Patient presented to the ED with complaints of lightheadedness/sensation of wooziness that started 5 days ago Friday.  She says she is not exactly dizzy.  She reports initial nausea, one episode of dry heaving, but no vomiting.  She reports the symptoms occur when she is standing, and  not present when she is lying down.  She denies heaviness of her extremities, no change in speech, no facial asymmetry. Patient also feels dehydrated, she describes her her skin feels wrinkly, and tongue is dry.  Also her urine has been very concentrated lately.  She denies pain with urination, no abdominal pain and reports she makes small amount of urine.  She denies NSAID use. Patient is supposed to be on EPO injections, she was previously taking it once a week, but her hemoglobin increased to 13, so it was reduced to once monthly, she missed her last injection last week. She denies vomiting, no blood in stools, no black stools.  Patient reports several attempts at getting MRI in the past, even with sedation/medications, she is unable to tolerate it.  ED Course: Heart rate 50s to 60s, blood pressure systolic up to 237S on my evaluation.  Hemoglobin 7.  UA shows small leukocytes 21-50 WBCs rare bacteria.  Potassium 3.1.  1 L bolus given.  Unit PRBC.  IV ceftriaxone for UTI.  Hospitalist to admit for acute on chronic anemia.  Review of Systems: As per HPI all other systems reviewed and negative.  Past Medical History:  Diagnosis Date  . Arthritis   . Diabetes mellitus   . GERD (gastroesophageal reflux disease)   . HLD  (hyperlipidemia)   . Hypercholesterolemia   . Hypertension   . Kidney stones   . Osteoporosis   . Proteinuria     Past Surgical History:  Procedure Laterality Date  . ABDOMINAL HYSTERECTOMY    . BACK SURGERY     lumbar  . BREAST CYST ASPIRATION     unsure of laterality   . CAPD INSERTION N/A 02/13/2018   Procedure: LAPAROSCOPIC INSERTION CONTINUOUS AMBULATORY PERITONEAL DIALYSIS  (CAPD) CATHETER;  Surgeon: Algernon Huxley, MD;  Location: [facility] ORS;  Service: Vascular;  Laterality: N/A;  . CHOLECYSTECTOMY    . EYE SURGERY     bilateral cataract   . NASAL SEPTUM SURGERY       reports that she has never smoked. She has never used smokeless tobacco. She reports that she does not drink alcohol and does not use drugs.  Allergies  Allergen Reactions  . Penicillins Anaphylaxis    Family History  Problem Relation Age of Onset  . Other Mother   . Heart attack Father   . Breast cancer Paternal Aunt     Prior to Admission medications   Medication Sig Start Date End Date Taking? Authorizing Provider  Acetaminophen (TYLENOL) 325 MG CAPS Take 1 capsule by mouth daily as needed.   Yes [provider]  atorvastatin (LIPITOR) 20 MG tablet Take 1 tablet (20 mg total) by mouth daily. Patient taking differently: Take 10 mg by mouth daily.  06/03/15  Yes , Modena Nunnery, MD  benazepril (LOTENSIN) 20 MG  tablet Take by mouth. 08/06/18  Yes [provider]  cloNIDine (CATAPRES) 0.1 MG tablet Take 1 tablet by mouth 3 (three) times daily. 07/19/20 08/18/20 Yes [provider]  diclofenac Sodium (VOLTAREN) 1 % GEL Apply 2 g topically daily as needed.    Yes [provider]  Ferrous Sulfate 134 MG TABS Take by mouth.   Yes [provider]  hydrALAZINE (APRESOLINE) 100 MG tablet Take 1 tablet (100 mg total) by mouth every 8 (eight) hours. Patient taking differently: Take 50 mg by mouth every 8 (eight) hours.  10/13/17 07/21/20 Yes Sainani, Belia Heman, MD   omeprazole (PRILOSEC) 40 MG capsule Take 40 mg by mouth daily.   Yes [provider]  potassium chloride (KLOR-CON) 10 MEQ tablet Take 10 mEq by mouth daily. 03/11/20  Yes [provider]  senna (SENOKOT) 8.6 MG tablet Take 2 tablets by mouth daily.   Yes [provider]  spironolactone (ALDACTONE) 25 MG tablet Take 1 tablet (25 mg total) by mouth daily. 10/14/17 07/21/20 Yes Sainani, Belia Heman, MD  torsemide (DEMADEX) 20 MG tablet Take 20 mg by mouth daily as needed.    Yes [provider]  ACCU-CHEK AVIVA PLUS test strip TESTING TWICE DAILY. 08/11/14   Susy Frizzle, MD  HYDROcodone-acetaminophen (NORCO) 5-325 MG tablet Take 1 tablet by mouth every 6 (six) hours as needed for moderate pain. Patient not taking: Reported on 07/21/2020 02/13/18   Algernon Huxley, MD  Insulin Glargine (LANTUS SOLOSTAR) 100 UNIT/ML Solostar Pen Inject 50-60 units at bedtime as directed Patient taking differently: Inject 20 Units into the skin daily at 10 pm. Inject 50-60 units at bedtime as directed 02/24/15   Alycia Rossetti, MD  Insulin Glargine (LANTUS SOLOSTAR) 100 UNIT/ML Solostar Pen Inject 20 Units into the skin at bedtime.  12/11/18   [provider]  Insulin Pen Needle 31G X 5 MM MISC As directed 03/31/15   Alycia Rossetti, MD    Physical Exam: Vitals:   07/21/20 1733 07/21/20 2003 07/21/20 2058 07/21/20 2114  BP: (!) 168/64 (!) 173/63 (!) 173/63 (!) 181/55  Pulse: (!) 52 (!) 55 (!) 55 62  Resp: 16 16 16 18   Temp:   98.3 F (36.8 C) 98.3 F (36.8 C)  TempSrc:   Oral Oral  SpO2: 99% 98% 100% 100%  Weight:      Height:        Constitutional: NAD, calm, comfortable Vitals:   07/21/20 1733 07/21/20 2003 07/21/20 2058 07/21/20 2114  BP: (!) 168/64 (!) 173/63 (!) 173/63 (!) 181/55  Pulse: (!) 52 (!) 55 (!) 55 62  Resp: 16 16 16 18   Temp:   98.3 F (36.8 C) 98.3 F (36.8 C)  TempSrc:   Oral Oral  SpO2: 99% 98% 100% 100%  Weight:      Height:        Eyes: PERRL, lids and conjunctivae normal ENMT: Mucous membranes are moist.  Neck: normal, supple, no masses, no thyromegaly Respiratory: clear to auscultation bilaterally, no wheezing, no crackles. Normal respiratory effort. No accessory muscle use.  Cardiovascular: Regular rate and rhythm, no murmurs / rubs / gallops. No extremity edema. 2+ pedal pulses.  Abdomen: no tenderness, no masses palpated. No hepatosplenomegaly. Bowel sounds positive.  Musculoskeletal: no clubbing / cyanosis. No joint deformity upper and lower extremities. Good ROM, no contractures. Normal muscle tone.  Skin: no rashes, lesions, ulcers. No induration Neurologic: No apparent cranial nerve abnormality, 5/5 strength in all  extremities.  Sensation intact. Psychiatric: Normal judgment and insight. Alert and oriented x 3. Normal mood.   Labs on Admission: I have personally reviewed following labs and imaging studies  CBC: Recent Labs  Lab 07/21/20 1357  WBC 7.4  HGB 7.0*  HCT 22.6*  MCV 86.9  PLT 829   Basic Metabolic Panel: Recent Labs  Lab 07/21/20 1357  NA 136  K 3.1*  CL 99  CO2 27  GLUCOSE 119*  BUN 42*  CREATININE 3.74*  CALCIUM 7.8*   CBG: Recent Labs  Lab 07/21/20 1301 07/21/20 1732  GLUCAP 128* 79   Anemia Panel: Recent Labs    07/21/20 1357  FOLATE 9.3  RETICCTPCT 1.0   Urine analysis:    Component Value Date/Time   COLORURINE YELLOW 07/21/2020 1546   APPEARANCEUR CLEAR 07/21/2020 1546   LABSPEC 1.012 07/21/2020 1546   PHURINE 5.0 07/21/2020 1546   GLUCOSEU NEGATIVE 07/21/2020 1546   HGBUR SMALL (A) 07/21/2020 1546   BILIRUBINUR NEGATIVE 07/21/2020 1546   KETONESUR NEGATIVE 07/21/2020 1546   PROTEINUR 30 (A) 07/21/2020 1546   UROBILINOGEN 0.2 02/24/2015 0853   NITRITE NEGATIVE 07/21/2020 1546   LEUKOCYTESUR SMALL (A) 07/21/2020 1546    Radiological Exams on Admission: No results found.  EKG: Independently reviewed.  Sinus rhythm, rate 55.  QTc 459.  T wave  abnormalities in V5 V6 , III aVF  Assessment/Plan Principal Problem:   Acute on chronic anemia Active Problems:   Diabetes mellitus ([place])   Essential hypertension   ESRD (end stage renal disease) ([place])   Peritoneal dialysis status ([facility])   Acute on chronic anemia-symptomatic-hemoglobin 7.  Baseline per Care Everywhere 9-10.  No history to suggest GI bleed..  Likely due to anemia of chronic disease.  Patient is on EPO injections.  Denies NSAID use.  IV ceftriaxone started in ED for probable UTI, UA not entirely convincing small leukocytes 21-50 WBCs.  She has no urinary symptoms. -Follow-up anemia panel -1 unit PRBC transfusing -Monitor hemoglobin -Further antibiotics held for now, -Follow-up urine cultures -N.p.o. midnight pending hemoglobin in a.m. and if further GI evaluation is needed.  ESRD, peritoneal dialysis status- nephrologist Dr. Candiss Norse, at [facility].  She is on as needed torsemide, but she has not taken it in a while.  Probably some dehydration contributing to symptoms.  1 L bolus given in ED. -[facility], [facility] not available at [facility] -Please consult nephrology in a.m.  Hypokalemia potassium 3.1. -20 mEq KCl given - Per HD  Diabetes mellitus-random glucose 119. - Hgba1c - SSI- [place]  Hypertension-elevated up to 562Z, diastolic 30Q to 65H.  Patient reports chronic elevation of her blood pressure. -As needed hydralazine 5 mg -Resume home  TID clonidine, TID hydralazine, benazepril, spironolactone.   DVT prophylaxis: SCDs Code Status: Full code Family Communication: None at bedside.  Plan of care explained to patient, she verbalized understanding. Disposition Plan: ~ 1 - 2 days Consults called: Please consult nephrology in the morning Admission status: Observation, telemetry.   Bethena Roys MD [facility]  07/21/2020, 10:14 PM

## 2020-07-22 ENCOUNTER — Other Ambulatory Visit: Payer: Self-pay

## 2020-07-22 ENCOUNTER — Observation Stay (HOSPITAL_COMMUNITY): Payer: Medicare PPO

## 2020-07-22 ENCOUNTER — Encounter (HOSPITAL_COMMUNITY): Payer: Self-pay | Admitting: Internal Medicine

## 2020-07-22 DIAGNOSIS — N186 End stage renal disease: Secondary | ICD-10-CM | POA: Diagnosis present

## 2020-07-22 DIAGNOSIS — E876 Hypokalemia: Secondary | ICD-10-CM | POA: Diagnosis present

## 2020-07-22 DIAGNOSIS — Z992 Dependence on renal dialysis: Secondary | ICD-10-CM | POA: Diagnosis not present

## 2020-07-22 DIAGNOSIS — E785 Hyperlipidemia, unspecified: Secondary | ICD-10-CM | POA: Diagnosis present

## 2020-07-22 DIAGNOSIS — E1122 Type 2 diabetes mellitus with diabetic chronic kidney disease: Secondary | ICD-10-CM | POA: Diagnosis present

## 2020-07-22 DIAGNOSIS — D631 Anemia in chronic kidney disease: Secondary | ICD-10-CM | POA: Diagnosis present

## 2020-07-22 DIAGNOSIS — Z880 Allergy status to penicillin: Secondary | ICD-10-CM | POA: Diagnosis not present

## 2020-07-22 DIAGNOSIS — R609 Edema, unspecified: Secondary | ICD-10-CM | POA: Diagnosis not present

## 2020-07-22 DIAGNOSIS — M810 Age-related osteoporosis without current pathological fracture: Secondary | ICD-10-CM | POA: Diagnosis present

## 2020-07-22 DIAGNOSIS — M8580 Other specified disorders of bone density and structure, unspecified site: Secondary | ICD-10-CM | POA: Diagnosis present

## 2020-07-22 DIAGNOSIS — Z87892 Personal history of anaphylaxis: Secondary | ICD-10-CM | POA: Diagnosis not present

## 2020-07-22 DIAGNOSIS — M1990 Unspecified osteoarthritis, unspecified site: Secondary | ICD-10-CM | POA: Diagnosis present

## 2020-07-22 DIAGNOSIS — I120 Hypertensive chronic kidney disease with stage 5 chronic kidney disease or end stage renal disease: Secondary | ICD-10-CM | POA: Diagnosis present

## 2020-07-22 DIAGNOSIS — E860 Dehydration: Secondary | ICD-10-CM | POA: Diagnosis present

## 2020-07-22 DIAGNOSIS — N390 Urinary tract infection, site not specified: Secondary | ICD-10-CM | POA: Diagnosis present

## 2020-07-22 DIAGNOSIS — K219 Gastro-esophageal reflux disease without esophagitis: Secondary | ICD-10-CM | POA: Diagnosis present

## 2020-07-22 DIAGNOSIS — D649 Anemia, unspecified: Secondary | ICD-10-CM | POA: Diagnosis present

## 2020-07-22 DIAGNOSIS — Z794 Long term (current) use of insulin: Secondary | ICD-10-CM | POA: Diagnosis not present

## 2020-07-22 DIAGNOSIS — N2581 Secondary hyperparathyroidism of renal origin: Secondary | ICD-10-CM | POA: Diagnosis present

## 2020-07-22 DIAGNOSIS — Z87442 Personal history of urinary calculi: Secondary | ICD-10-CM | POA: Diagnosis not present

## 2020-07-22 DIAGNOSIS — M898X9 Other specified disorders of bone, unspecified site: Secondary | ICD-10-CM | POA: Diagnosis present

## 2020-07-22 DIAGNOSIS — Z79899 Other long term (current) drug therapy: Secondary | ICD-10-CM | POA: Diagnosis not present

## 2020-07-22 DIAGNOSIS — Z20822 Contact with and (suspected) exposure to covid-19: Secondary | ICD-10-CM | POA: Diagnosis present

## 2020-07-22 DIAGNOSIS — R42 Dizziness and giddiness: Secondary | ICD-10-CM | POA: Diagnosis present

## 2020-07-22 DIAGNOSIS — Z8249 Family history of ischemic heart disease and other diseases of the circulatory system: Secondary | ICD-10-CM | POA: Diagnosis not present

## 2020-07-22 LAB — HEMOGLOBIN A1C
Hgb A1c MFr Bld: 6 % — ABNORMAL HIGH (ref 4.8–5.6)
Mean Plasma Glucose: 125.5 mg/dL

## 2020-07-22 LAB — CBC
HCT: 21.9 % — ABNORMAL LOW (ref 36.0–46.0)
Hemoglobin: 6.9 g/dL — CL (ref 12.0–15.0)
MCH: 27.4 pg (ref 26.0–34.0)
MCHC: 31.5 g/dL (ref 30.0–36.0)
MCV: 86.9 fL (ref 80.0–100.0)
Platelets: 259 10*3/uL (ref 150–400)
RBC: 2.52 MIL/uL — ABNORMAL LOW (ref 3.87–5.11)
RDW: 15.2 % (ref 11.5–15.5)
WBC: 6.5 10*3/uL (ref 4.0–10.5)
nRBC: 0 % (ref 0.0–0.2)

## 2020-07-22 LAB — GLUCOSE, CAPILLARY
Glucose-Capillary: 117 mg/dL — ABNORMAL HIGH (ref 70–99)
Glucose-Capillary: 118 mg/dL — ABNORMAL HIGH (ref 70–99)
Glucose-Capillary: 146 mg/dL — ABNORMAL HIGH (ref 70–99)
Glucose-Capillary: 373 mg/dL — ABNORMAL HIGH (ref 70–99)

## 2020-07-22 LAB — BPAM RBC
Blood Product Expiration Date: 202112192359
ISSUE DATE / TIME: 202111172047
Unit Type and Rh: 6200

## 2020-07-22 LAB — BASIC METABOLIC PANEL
Anion gap: 9 (ref 5–15)
BUN: 44 mg/dL — ABNORMAL HIGH (ref 8–23)
CO2: 24 mmol/L (ref 22–32)
Calcium: 7.5 mg/dL — ABNORMAL LOW (ref 8.9–10.3)
Chloride: 103 mmol/L (ref 98–111)
Creatinine, Ser: 3.8 mg/dL — ABNORMAL HIGH (ref 0.44–1.00)
GFR, Estimated: 11 mL/min — ABNORMAL LOW (ref >60)
Glucose, Bld: 123 mg/dL — ABNORMAL HIGH (ref 70–99)
Potassium: 3.7 mmol/L (ref 3.5–5.1)
Sodium: 136 mmol/L (ref 135–145)

## 2020-07-22 LAB — TYPE AND SCREEN
ABO/RH(D): A POS
Antibody Screen: NEGATIVE
Unit division: 0

## 2020-07-22 LAB — HEMOGLOBIN AND HEMATOCRIT, BLOOD
HCT: 25.8 % — ABNORMAL LOW (ref 36.0–46.0)
Hemoglobin: 8.3 g/dL — ABNORMAL LOW (ref 12.0–15.0)

## 2020-07-22 LAB — PREPARE RBC (CROSSMATCH)

## 2020-07-22 MED ORDER — GENTAMICIN SULFATE 0.1 % EX CREA
1.0000 "application " | TOPICAL_CREAM | Freq: Every day | CUTANEOUS | Status: DC
  Administered 2020-07-22 – 2020-07-25 (×6): 1 via TOPICAL
  Filled 2020-07-22: qty 15

## 2020-07-22 MED ORDER — SPIRONOLACTONE 25 MG PO TABS
25.0000 mg | ORAL_TABLET | Freq: Every day | ORAL | Status: DC
  Administered 2020-07-22 – 2020-07-25 (×4): 25 mg via ORAL
  Filled 2020-07-22 (×6): qty 1

## 2020-07-22 MED ORDER — POLYETHYLENE GLYCOL 3350 17 G PO PACK: 17.0000 g | PACK | Freq: Every day | ORAL | Status: DC | PRN

## 2020-07-22 MED ORDER — ACETAMINOPHEN 325 MG PO TABS
650.0000 mg | ORAL_TABLET | Freq: Four times a day (QID) | ORAL | Status: DC | PRN
  Administered 2020-07-22: 650 mg via ORAL
  Filled 2020-07-22: qty 2

## 2020-07-22 MED ORDER — BENAZEPRIL HCL 20 MG PO TABS
20.0000 mg | ORAL_TABLET | Freq: Every day | ORAL | Status: DC
  Administered 2020-07-23 – 2020-07-25 (×3): 20 mg via ORAL
  Filled 2020-07-22 (×5): qty 1

## 2020-07-22 MED ORDER — INSULIN ASPART 100 UNIT/ML ~~LOC~~ SOLN
0.0000 [IU] | Freq: Three times a day (TID) | SUBCUTANEOUS | Status: DC
  Administered 2020-07-23 – 2020-07-24 (×2): 1 [IU] via SUBCUTANEOUS
  Administered 2020-07-24 – 2020-07-25 (×3): 2 [IU] via SUBCUTANEOUS
  Administered 2020-07-25: 1 [IU] via SUBCUTANEOUS

## 2020-07-22 MED ORDER — HYDRALAZINE HCL 20 MG/ML IJ SOLN
5.0000 mg | INTRAMUSCULAR | Status: DC | PRN
  Administered 2020-07-22: 5 mg via INTRAVENOUS
  Filled 2020-07-22: qty 1

## 2020-07-22 MED ORDER — ACETAMINOPHEN 650 MG RE SUPP: 650.0000 mg | Freq: Four times a day (QID) | RECTAL | Status: DC | PRN

## 2020-07-22 MED ORDER — SODIUM CHLORIDE 0.9 % IV SOLN
1.0000 g | INTRAVENOUS | Status: DC
  Administered 2020-07-22 – 2020-07-23 (×2): 1 g via INTRAVENOUS
  Filled 2020-07-22: qty 10

## 2020-07-22 MED ORDER — SENNA 8.6 MG PO TABS
2.0000 | ORAL_TABLET | Freq: Every day | ORAL | Status: DC
  Administered 2020-07-22 – 2020-07-25 (×4): 17.2 mg via ORAL
  Filled 2020-07-22 (×6): qty 2

## 2020-07-22 MED ORDER — INSULIN ASPART 100 UNIT/ML ~~LOC~~ SOLN
0.0000 [IU] | Freq: Every day | SUBCUTANEOUS | Status: DC
  Administered 2020-07-22: 5 [IU] via SUBCUTANEOUS
  Administered 2020-07-23 – 2020-07-24 (×2): 3 [IU] via SUBCUTANEOUS

## 2020-07-22 MED ORDER — ATORVASTATIN CALCIUM 10 MG PO TABS
10.0000 mg | ORAL_TABLET | Freq: Every day | ORAL | Status: DC
  Administered 2020-07-22 – 2020-07-25 (×4): 10 mg via ORAL
  Filled 2020-07-22 (×4): qty 1

## 2020-07-22 MED ORDER — ONDANSETRON HCL 4 MG PO TABS: 4.0000 mg | ORAL_TABLET | Freq: Four times a day (QID) | ORAL | Status: DC | PRN

## 2020-07-22 MED ORDER — DELFLEX-LC/2.5% DEXTROSE 394 MOSM/L IP SOLN: INTRAPERITONEAL | Status: DC

## 2020-07-22 MED ORDER — SODIUM CHLORIDE 0.9% IV SOLUTION: Freq: Once | INTRAVENOUS | Status: AC

## 2020-07-22 MED ORDER — ONDANSETRON HCL 4 MG/2ML IJ SOLN: 4.0000 mg | Freq: Four times a day (QID) | INTRAMUSCULAR | Status: DC | PRN

## 2020-07-22 MED ORDER — SODIUM CHLORIDE 0.9 % IV SOLN: 2.0000 g | INTRAVENOUS | Status: DC

## 2020-07-22 MED ORDER — HEPARIN 1000 UNIT/ML FOR PERITONEAL DIALYSIS: 500.0000 [IU] | INTRAMUSCULAR | Status: DC | PRN

## 2020-07-22 MED ORDER — HEPARIN 1000 UNIT/ML FOR PERITONEAL DIALYSIS
INTRAPERITONEAL | Status: DC | PRN
  Filled 2020-07-22: qty 3000

## 2020-07-22 NOTE — ED Notes (Signed)
Patient given a pillow to prop her head up. Also given water to drink.

## 2020-07-22 NOTE — Consult Note (Signed)
[facility] Renal Consultation Note    Indication for Consultation:  Management of ESRD/hemodialysis; anemia, hypertension/volume and secondary hyperparathyroidism  HPI: Catherine Burke is a 79 y.o. female with ESRD on PD, DM, HTN, HLD. She is admitted with symptomatic anemia. Felt dizzy/weak at home and was instructed to come to the ED. Hgb 6.9 on admission. Has been transfused 1 unit prbcs so far. Nephrology consult for PD.   Feels much better following transfusion. Denies any overt bleeding/melena. No complaints this am. Did have mild fever last night 100.21F. Blood cultures collected.   On CCPD x 3 years. Followed by Dr. Landry Mellow [facility]. Dialysis at [facility]. Missed PD last night d/t hospital admission. No issues with PD recently. No f,c, abd pain, n/v, cloudy effluent. Will check PD fluid today d/t fever.   Past Medical History:  Diagnosis Date  . Arthritis   . Diabetes mellitus   . GERD (gastroesophageal reflux disease)   . HLD (hyperlipidemia)   . Hypercholesterolemia   . Hypertension   . Kidney stones   . Osteoporosis   . Proteinuria    Past Surgical History:  Procedure Laterality Date  . ABDOMINAL HYSTERECTOMY    . BACK SURGERY     lumbar  . BREAST CYST ASPIRATION     unsure of laterality   . CAPD INSERTION N/A 02/13/2018   Procedure: LAPAROSCOPIC INSERTION CONTINUOUS AMBULATORY PERITONEAL DIALYSIS  (CAPD) CATHETER;  Surgeon: Algernon Huxley, MD;  Location: [facility] ORS;  Service: Vascular;  Laterality: N/A;  . CHOLECYSTECTOMY    . EYE SURGERY     bilateral cataract   . NASAL SEPTUM SURGERY     Family History  Problem Relation Age of Onset  . Other Mother   . Heart attack Father   . Breast cancer Paternal Aunt    Social History:  reports that she has never smoked. She has never used smokeless tobacco. She reports that she does not drink alcohol and does not use drugs. Allergies  Allergen Reactions  . Penicillins Anaphylaxis    Prior to Admission medications   Medication Sig Start Date End Date Taking? Authorizing Provider  Acetaminophen (TYLENOL) 325 MG CAPS Take 1 capsule by mouth daily as needed.   Yes [provider]  atorvastatin (LIPITOR) 20 MG tablet Take 1 tablet (20 mg total) by mouth daily. Patient taking differently: Take 10 mg by mouth daily.  06/03/15  Yes Panola, Modena Nunnery, MD  benazepril (LOTENSIN) 20 MG tablet Take by mouth. 08/06/18  Yes [provider]  cloNIDine (CATAPRES) 0.1 MG tablet Take 1 tablet by mouth 3 (three) times daily. 07/19/20 08/18/20 Yes [provider]  diclofenac Sodium (VOLTAREN) 1 % GEL Apply 2 g topically daily as needed.    Yes [provider]  Ferrous Sulfate 134 MG TABS Take by mouth.   Yes [provider]  hydrALAZINE (APRESOLINE) 100 MG tablet Take 1 tablet (100 mg total) by mouth every 8 (eight) hours. Patient taking differently: Take 50 mg by mouth every 8 (eight) hours.  10/13/17 07/21/20 Yes Sainani, Belia Heman, MD  omeprazole (PRILOSEC) 40 MG capsule Take 40 mg by mouth daily.   Yes [provider]  potassium chloride (KLOR-CON) 10 MEQ tablet Take 10 mEq by mouth daily. 03/11/20  Yes [provider]  senna (SENOKOT) 8.6 MG tablet Take 2 tablets by mouth daily.   Yes [provider]  spironolactone (ALDACTONE) 25 MG tablet Take 1 tablet (25 mg total) by  mouth daily. 10/14/17 07/21/20 Yes Sainani, Belia Heman, MD  torsemide (DEMADEX) 20 MG tablet Take 20 mg by mouth daily as needed.    Yes [provider]  ACCU-CHEK AVIVA PLUS test strip TESTING TWICE DAILY. 08/11/14   Susy Frizzle, MD  HYDROcodone-acetaminophen (NORCO) 5-325 MG tablet Take 1 tablet by mouth every 6 (six) hours as needed for moderate pain. Patient not taking: Reported on 07/21/2020 02/13/18   Algernon Huxley, MD  Insulin Glargine (LANTUS SOLOSTAR) 100 UNIT/ML Solostar Pen Inject 50-60 units at bedtime as directed Patient taking  differently: Inject 20 Units into the skin daily at 10 pm. Inject 50-60 units at bedtime as directed 02/24/15   Alycia Rossetti, MD  Insulin Glargine (LANTUS SOLOSTAR) 100 UNIT/ML Solostar Pen Inject 20 Units into the skin at bedtime.  12/11/18   [provider]  Insulin Pen Needle 31G X 5 MM MISC As directed 03/31/15   Alycia Rossetti, MD   Current Facility-Administered Medications  Medication Dose Route Frequency Provider Last Rate Last Admin  . 0.9 %  sodium chloride infusion (Manually program via Guardrails IV Fluids)   Intravenous Once Dessa Phi, DO      . acetaminophen (TYLENOL) tablet 650 mg  650 mg Oral Q6H PRN Emokpae, Ejiroghene E, MD       Or  . acetaminophen (TYLENOL) suppository 650 mg  650 mg Rectal Q6H PRN Emokpae, Ejiroghene E, MD      . atorvastatin (LIPITOR) tablet 10 mg  10 mg Oral Daily Emokpae, Ejiroghene E, MD   10 mg at 07/22/20 1018  . benazepril (LOTENSIN) tablet 20 mg  20 mg Oral Daily Emokpae, Ejiroghene E, MD      . cefTRIAXone (ROCEPHIN) 1 g in sodium chloride 0.9 % 100 mL IVPB  1 g Intravenous Q24H Dessa Phi, DO      . cloNIDine (CATAPRES) tablet 0.1 mg  0.1 mg Oral TID Emokpae, Ejiroghene E, MD   0.1 mg at 07/22/20 1018  . hydrALAZINE (APRESOLINE) injection 5 mg  5 mg Intravenous Once Emokpae, Ejiroghene E, MD      . hydrALAZINE (APRESOLINE) injection 5 mg  5 mg Intravenous Q4H PRN Emokpae, Ejiroghene E, MD      . hydrALAZINE (APRESOLINE) tablet 50 mg  50 mg Oral Q8H Emokpae, Ejiroghene E, MD   50 mg at 07/22/20 0622  . insulin aspart (novoLOG) injection 0-5 Units  0-5 Units Subcutaneous QHS Emokpae, Ejiroghene E, MD      . insulin aspart (novoLOG) injection 0-6 Units  0-6 Units Subcutaneous TID WC Emokpae, Ejiroghene E, MD      . ondansetron (ZOFRAN) tablet 4 mg  4 mg Oral Q6H PRN Emokpae, Ejiroghene E, MD       Or  . ondansetron (ZOFRAN) injection 4 mg  4 mg Intravenous Q6H PRN Emokpae, Ejiroghene E, MD      . polyethylene glycol (MIRALAX /  GLYCOLAX) packet 17 g  17 g Oral Daily PRN Emokpae, Ejiroghene E, MD      . senna (SENOKOT) tablet 17.2 mg  2 tablet Oral Daily Emokpae, Ejiroghene E, MD   17.2 mg at 07/22/20 1018  . spironolactone (ALDACTONE) tablet 25 mg  25 mg Oral Daily Emokpae, Ejiroghene E, MD   25 mg at 07/22/20 1018     ROS: As per HPI otherwise negative.  Physical Exam: Vitals:   07/22/20 0900 07/22/20 1000 07/22/20 1100 07/22/20 1123  BP:    (!) 133/53  Pulse:    Marland Kitchen)  48  Resp: 17 16 (!) 22 17  Temp:    98.4 F (36.9 C)  TempSrc:    Oral  SpO2:    97%  Weight:      Height:         General: Well appearing, nad  Head: NCAT sclera not icteric Neck: Supple. No JVD  Lungs: CTA bilaterally without wheezes, rales, or rhonchi. Breathing is unlabored. Heart: RRR with S1 S2 Abdomen: soft NT + BS Lower extremities:without edema or ischemic changes, no open wounds  Neuro: A & O  X 3. Moves all extremities spontaneously. Psych:  Responds to questions appropriately with a normal affect. Dialysis Access: PD cath in place, dsg c,d,i   Labs: Basic Metabolic Panel: Recent Labs  Lab 07/21/20 1357 07/22/20 0419  NA 136 136  K 3.1* 3.7  CL 99 103  CO2 27 24  GLUCOSE 119* 123*  BUN 42* 44*  CREATININE 3.74* 3.80*  CALCIUM 7.8* 7.5*   Liver Function Tests: No results for input(s): AST, ALT, ALKPHOS, BILITOT, PROT, ALBUMIN in the last 168 hours. No results for input(s): LIPASE, AMYLASE in the last 168 hours. No results for input(s): AMMONIA in the last 168 hours. CBC: Recent Labs  Lab 07/21/20 1357 07/22/20 0849  WBC 7.4 6.5  HGB 7.0* 6.9*  HCT 22.6* 21.9*  MCV 86.9 86.9  PLT 294 259   Cardiac Enzymes: No results for input(s): CKTOTAL, CKMB, CKMBINDEX, TROPONINI in the last 168 hours. CBG: Recent Labs  Lab 07/21/20 1301 07/21/20 1732 07/22/20 0611 07/22/20 1124  GLUCAP 128* 79 117* 118*   Iron Studies:  Recent Labs    07/21/20 1357  IRON 21*  TIBC 226*  FERRITIN 697*    Studies/Results: DG CHEST PORT 1 VIEW  Result Date: 07/22/2020 CLINICAL DATA:  Fever.  Dizziness for a week. EXAM: PORTABLE CHEST 1 VIEW COMPARISON:  04/28/2020 FINDINGS: Cardiac silhouette is mildly enlarged. No mediastinal or hilar masses. Chronic scarring at the left lung base. Chronic prominence of the bronchovascular markings. No evidence of pneumonia or pulmonary edema. No pleural effusion or pneumothorax. Skeletal structures are grossly intact. IMPRESSION: No acute cardiopulmonary disease. Electronically Signed   By: Lajean Manes M.D.   On: 07/22/2020 08:10    Dialysis Orders:  [facility]  EDW 60kg 5 exchanges 2L 1hr 24 min dwell. No day dwell    Assessment/Plan: 1. Symptomatic anemia. Getting transfusion here. FOBT negative. Will check ESA orders with outpatient clinic.  2. Fever overnight - Blood cultures ngtd. Will check PD fluid count but no clinical signs of peritonitis.  3. ESRD -  CCPD. Will continue PD on cycler tonight and check PD fluid count. 4. Hypertension/volume  - Blood pressure/volume ok. Is at her dry weight by weights here.  5.  Metabolic bone disease -  Continue home binder, calcitrion  6.  DM - per primary   Lynnda Child PA-C [facility] 07/22/2020, 12:34 PM

## 2020-07-22 NOTE — Plan of Care (Signed)
  Problem: Education: Goal: Knowledge of General Education information will improve Description Including pain rating scale, medication(s)/side effects and non-pharmacologic comfort measures Outcome: Progressing   

## 2020-07-22 NOTE — Progress Notes (Signed)
PROGRESS NOTE    Catherine Burke  ZDG:[id number] DOB: 04/30/1941 DOA: 07/21/2020 PCP: Idelle Crouch, MD     Brief Narrative:  Catherine Burke is a 79 y.o. female with medical history significant for diabetes mellitus, hypertension, ESRD on PD. Patient presented to the ED with complaints of lightheadedness/sensation of wooziness that started 5 days ago Friday.  She reports the symptoms occur when she is standing, and  not present when she is lying down. Patient also feels dehydrated, she describes her her skin feels wrinkly, and tongue is dry.  Also her urine has been very concentrated lately. She denies vomiting, no blood in stools, no black stools.  In the emergency department, she was found to have anemia with hemoglobin 7.  She was transfused 1 unit packed red blood cells.  Started on IV Rocephin for suspected UTI.  New events last 24 hours / Subjective: Had fever overnight 100.6.  Denies any new complaints.  Assessment & Plan:   Principal Problem:   Acute on chronic anemia Active Problems:   Diabetes mellitus ([place])   Essential hypertension   ESRD (end stage renal disease) ([place])   Peritoneal dialysis status ([facility])   Symptomatic anemia with history of anemia of chronic disease -Baseline hemoglobin around 9-10, on EPO as outpatient -Denies any blood loss, blood in stool, vaginal bleeding, hematemesis.  FOBT negative on admission -Transfused 1 unit packed red blood cell 11/17, 1 additional unit transfusion today 11/18  Fever -Sepsis ruled out -T-max 100.6 overnight.  UA on admission was largely unremarkable.  Covid, influenza negative.  Urine culture is pending.  Blood cultures ordered.  Chest x-ray negative. Check peritoneal fluid   ESRD on PD -Follows with Dr. Candiss Norse at [facility] today  Diabetes mellitus type 2 -Hemoglobin A1c pending  -Sliding scale insulin  Essential hypertension -Continue benazepril, spironolactone, hydralazine,  clonidine  Hyperlipidemia -Continue Lipitor    DVT prophylaxis:  SCDs Start: 07/22/20 0344  Code Status: Full code Family Communication: No family at bedside Disposition Plan:  Status is: Observation  The patient will require care spanning > 2 midnights and should be moved to inpatient because: Inpatient level of care appropriate due to severity of illness  Dispo: The patient is from: Home              Anticipated d/c is to: Home              Anticipated d/c date is: 2 days              Patient currently is not medically stable to d/c.  Fever work-up, additional 1 unit packed red blood cell transfusion today.      Consultants:   Nephrology  Procedures:   None  Antimicrobials:  Anti-infectives (From admission, onward)   Start     Dose/Rate Route Frequency Ordered Stop   07/21/20 2000  cefTRIAXone (ROCEPHIN) 1 g in sodium chloride 0.9 % 100 mL IVPB        1 g 200 mL/hr over 30 Minutes Intravenous  Once 07/21/20 1950 07/21/20 2045        Objective: Vitals:   07/22/20 0436 07/22/20 0524 07/22/20 0743 07/22/20 0900  BP:  (!) 141/66 (!) 132/51   Pulse:  (!) 50 (!) 53   Resp:  20 18 17   Temp:  (!) 100.6 F (38.1 C) 98.6 F (37 C)   TempSrc:  Oral Oral   SpO2: 94% 98% 97%   Weight:  60 kg  Height:  5\' 3"  (1.6 m)      Intake/Output Summary (Last 24 hours) at 07/22/2020 1118 Last data filed at 07/22/2020 0737 Gross per 24 hour  Intake 495 ml  Output 0 ml  Net 495 ml   Filed Weights   07/21/20 1256 07/22/20 0524  Weight: 60.3 kg 60 kg    Examination:  General exam: Appears calm and comfortable  Respiratory system: Clear to auscultation. Respiratory effort normal. No respiratory distress. No conversational dyspnea.  Cardiovascular system: S1 & S2 heard, RRR. No murmurs. No pedal edema. Gastrointestinal system: Abdomen is nondistended, soft. Normal bowel sounds heard. Central nervous system: Alert and oriented. No focal neurological deficits. Speech  clear.  Extremities: Symmetric in appearance  Skin: No rashes, lesions or ulcers on exposed skin  Psychiatry: Judgement and insight appear normal. Mood & affect appropriate.   Data Reviewed: I have personally reviewed following labs and imaging studies  CBC: Recent Labs  Lab 07/21/20 1357 07/22/20 0849  WBC 7.4 6.5  HGB 7.0* 6.9*  HCT 22.6* 21.9*  MCV 86.9 86.9  PLT 294 099   Basic Metabolic Panel: Recent Labs  Lab 07/21/20 1357 07/22/20 0419  NA 136 136  K 3.1* 3.7  CL 99 103  CO2 27 24  GLUCOSE 119* 123*  BUN 42* 44*  CREATININE 3.74* 3.80*  CALCIUM 7.8* 7.5*   GFR: Estimated Creatinine Clearance: 9.8 mL/min (A) (by C-G formula based on SCr of 3.8 mg/dL (H)). Liver Function Tests: No results for input(s): AST, ALT, ALKPHOS, BILITOT, PROT, ALBUMIN in the last 168 hours. No results for input(s): LIPASE, AMYLASE in the last 168 hours. No results for input(s): AMMONIA in the last 168 hours. Coagulation Profile: No results for input(s): INR, PROTIME in the last 168 hours. Cardiac Enzymes: No results for input(s): CKTOTAL, CKMB, CKMBINDEX, TROPONINI in the last 168 hours. BNP (last 3 results) No results for input(s): PROBNP in the last 8760 hours. HbA1C: No results for input(s): HGBA1C in the last 72 hours. CBG: Recent Labs  Lab 07/21/20 1301 07/21/20 1732 07/22/20 0611  GLUCAP 128* 79 117*   Lipid Profile: No results for input(s): CHOL, HDL, LDLCALC, TRIG, CHOLHDL, LDLDIRECT in the last 72 hours. Thyroid Function Tests: No results for input(s): TSH, T4TOTAL, FREET4, T3FREE, THYROIDAB in the last 72 hours. Anemia Panel: Recent Labs    07/21/20 1357  VITAMINB12 228  FOLATE 9.3  FERRITIN 697*  TIBC 226*  IRON 21*  RETICCTPCT 1.0   Sepsis Labs: No results for input(s): PROCALCITON, LATICACIDVEN in the last 168 hours.  Recent Results (from the past 240 hour(s))  Respiratory Panel by RT PCR (Flu A&B, Covid) - Nasopharyngeal Swab     Status: None    Collection Time: 07/21/20  8:25 PM   Specimen: Nasopharyngeal Swab  Result Value Ref Range Status   SARS Coronavirus 2 by RT PCR NEGATIVE NEGATIVE Final    Comment: (NOTE) SARS-CoV-2 target nucleic acids are NOT DETECTED.  The SARS-CoV-2 RNA is generally detectable in upper respiratoy specimens during the acute phase of infection. The lowest concentration of SARS-CoV-2 viral copies this assay can detect is 131 copies/mL. A negative result does not preclude SARS-Cov-2 infection and should not be used as the sole basis for treatment or other patient management decisions. A negative result may occur with  improper specimen collection/handling, submission of specimen other than nasopharyngeal swab, presence of viral mutation(s) within the areas targeted by this assay, and inadequate number of viral copies (<131 copies/mL). A negative  result must be combined with clinical observations, patient history, and epidemiological information. The expected result is Negative.  Fact Sheet for Patients:  PinkCheek.be  Fact Sheet for Healthcare Providers:  GravelBags.it  This test is no t yet approved or cleared by the [place] FDA and  has been authorized for detection and/or diagnosis of SARS-CoV-2 by FDA under an Emergency Use Authorization (EUA). This EUA will remain  in effect (meaning this test can be used) for the duration of the COVID-19 declaration under Section 564(b)(1) of the Act, 21 U.S.C. section 360bbb-3(b)(1), unless the authorization is terminated or revoked sooner.     Influenza A by PCR NEGATIVE NEGATIVE Final   Influenza B by PCR NEGATIVE NEGATIVE Final    Comment: (NOTE) The Xpert Xpress SARS-CoV-2/FLU/RSV assay is intended as an aid in  the diagnosis of influenza from Nasopharyngeal swab specimens and  should not be used as a sole basis for treatment. Nasal washings and  aspirates are unacceptable for Xpert  Xpress SARS-CoV-2/FLU/RSV  testing.  Fact Sheet for Patients: PinkCheek.be  Fact Sheet for Healthcare Providers: GravelBags.it  This test is not yet approved or cleared by the [place] FDA and  has been authorized for detection and/or diagnosis of SARS-CoV-2 by  FDA under an Emergency Use Authorization (EUA). This EUA will remain  in effect (meaning this test can be used) for the duration of the  Covid-19 declaration under Section 564(b)(1) of the Act, 21  U.S.C. section 360bbb-3(b)(1), unless the authorization is  terminated or revoked. Performed at [facility], [address] [place], [place] [address]       Radiology Studies: DG CHEST PORT 1 VIEW  Result Date: 07/22/2020 CLINICAL DATA:  Fever.  Dizziness for a week. EXAM: PORTABLE CHEST 1 VIEW COMPARISON:  04/28/2020 FINDINGS: Cardiac silhouette is mildly enlarged. No mediastinal or hilar masses. Chronic scarring at the left lung base. Chronic prominence of the bronchovascular markings. No evidence of pneumonia or pulmonary edema. No pleural effusion or pneumothorax. Skeletal structures are grossly intact. IMPRESSION: No acute cardiopulmonary disease. Electronically Signed   By: Lajean Manes M.D.   On: 07/22/2020 08:10      Scheduled Meds: . sodium chloride   Intravenous Once  . atorvastatin  10 mg Oral Daily  . benazepril  20 mg Oral Daily  . cloNIDine  0.1 mg Oral TID  . hydrALAZINE  5 mg Intravenous Once  . hydrALAZINE  50 mg Oral Q8H  . insulin aspart  0-5 Units Subcutaneous QHS  . insulin aspart  0-6 Units Subcutaneous TID WC  . senna  2 tablet Oral Daily  . spironolactone  25 mg Oral Daily   Continuous Infusions:   LOS: 0 days      Time spent: 35 minutes   Dessa Phi, DO [facility] 07/22/2020, 11:18 AM   Available via Epic secure chat 7am-7pm After these hours, please refer to coverage provider listed on [email or web address]

## 2020-07-23 DIAGNOSIS — D649 Anemia, unspecified: Secondary | ICD-10-CM | POA: Diagnosis not present

## 2020-07-23 LAB — GLUCOSE, CAPILLARY
Glucose-Capillary: 146 mg/dL — ABNORMAL HIGH (ref 70–99)
Glucose-Capillary: 182 mg/dL — ABNORMAL HIGH (ref 70–99)
Glucose-Capillary: 194 mg/dL — ABNORMAL HIGH (ref 70–99)
Glucose-Capillary: 141 mg/dL — ABNORMAL HIGH (ref 70–99)
Glucose-Capillary: 140 mg/dL — ABNORMAL HIGH (ref 70–99)
Glucose-Capillary: 255 mg/dL — ABNORMAL HIGH (ref 70–99)

## 2020-07-23 LAB — TYPE AND SCREEN
ABO/RH(D): A POS
Antibody Screen: NEGATIVE
Unit division: 0

## 2020-07-23 LAB — CBC
HCT: 27.8 % — ABNORMAL LOW (ref 36.0–46.0)
Hemoglobin: 9.1 g/dL — ABNORMAL LOW (ref 12.0–15.0)
MCH: 28.3 pg (ref 26.0–34.0)
MCHC: 32.7 g/dL (ref 30.0–36.0)
MCV: 86.3 fL (ref 80.0–100.0)
Platelets: 236 10*3/uL (ref 150–400)
RBC: 3.22 MIL/uL — ABNORMAL LOW (ref 3.87–5.11)
RDW: 14.9 % (ref 11.5–15.5)
WBC: 6.4 10*3/uL (ref 4.0–10.5)
nRBC: 0 % (ref 0.0–0.2)

## 2020-07-23 LAB — BPAM RBC
Blood Product Expiration Date: 202112142359
ISSUE DATE / TIME: 202111181452
Unit Type and Rh: 6200

## 2020-07-23 LAB — URINE CULTURE: Culture: NO GROWTH

## 2020-07-23 LAB — BODY FLUID CELL COUNT WITH DIFFERENTIAL

## 2020-07-23 LAB — BASIC METABOLIC PANEL
Anion gap: 11 (ref 5–15)
BUN: 41 mg/dL — ABNORMAL HIGH (ref 8–23)
CO2: 24 mmol/L (ref 22–32)
Calcium: 7.7 mg/dL — ABNORMAL LOW (ref 8.9–10.3)
Chloride: 105 mmol/L (ref 98–111)
Creatinine, Ser: 3.94 mg/dL — ABNORMAL HIGH (ref 0.44–1.00)
GFR, Estimated: 11 mL/min — ABNORMAL LOW (ref >60)
Glucose, Bld: 168 mg/dL — ABNORMAL HIGH (ref 70–99)
Potassium: 3.3 mmol/L — ABNORMAL LOW (ref 3.5–5.1)
Sodium: 140 mmol/L (ref 135–145)

## 2020-07-23 NOTE — Progress Notes (Addendum)
[facility] Progress Note   Subjective:  Seen in room. On cycler, no issues with PD overnight.  Fevers, temp up this am.  Did have some SOB last night, no abd pain.   Objective Vitals:   07/22/20 2155 07/23/20 0019 07/23/20 0523 07/23/20 0554  BP: (!) 153/54 (!) 135/48 (!) 139/47 (!) 147/48  Pulse: 65 (!) 51 (!) 57 (!) 55  Resp: 20 20 (!) 21   Temp: 98.2 F (36.8 C) 99 F (37.2 C) (!) 100.4 F (38 C)   TempSrc: Oral Oral Oral   SpO2: 96% 96% 96%   Weight:   59.6 kg   Height:         Additional Objective Labs: Basic Metabolic Panel: Recent Labs  Lab 07/21/20 1357 07/22/20 0419 07/23/20 0123  NA 136 136 140  K 3.1* 3.7 3.3*  CL 99 103 105  CO2 27 24 24   GLUCOSE 119* 123* 168*  BUN 42* 44* 41*  CREATININE 3.74* 3.80* 3.94*  CALCIUM 7.8* 7.5* 7.7*   CBC: Recent Labs  Lab 07/21/20 1357 07/21/20 1357 07/22/20 0849 07/22/20 2248 07/23/20 0123  WBC 7.4  --  6.5  --  6.4  HGB 7.0*   < > 6.9* 8.3* 9.1*  HCT 22.6*   < > 21.9* 25.8* 27.8*  MCV 86.9  --  86.9  --  86.3  PLT 294  --  259  --  236   < > = values in this interval not displayed.   Blood Culture    Component Value Date/Time   SDES BLOOD LEFT ANTECUBITAL 07/22/2020 0849   SDES BLOOD BLOOD RIGHT HAND 07/22/2020 0849   SPECREQUEST  07/22/2020 0849    BOTTLES DRAWN AEROBIC AND ANAEROBIC Blood Culture results may not be optimal due to an inadequate volume of blood received in culture bottles   SPECREQUEST  07/22/2020 0849    BOTTLES DRAWN AEROBIC AND ANAEROBIC Blood Culture results may not be optimal due to an inadequate volume of blood received in culture bottles   CULT  07/22/2020 0849    NO GROWTH < 12 HOURS Performed at [facility], [place] [address] [place], [place] [address]    CULT  07/22/2020 0849    NO GROWTH < 12 HOURS Performed at [facility] [address], [place], [place] [address]    REPTSTATUS PENDING 07/22/2020 0849   REPTSTATUS PENDING 07/22/2020 0849      Physical Exam General: Well appearing, nad Heart: RRR no,m,r,g  Lungs: clear, bilaterally  Abdomen: soft, full with PD fluid  Extremities: No LE edema  Dialysis Access: PD cath in place   Medications: . cefTRIAXone (ROCEPHIN)  IV 1 g (07/22/20 1232)  . dialysis solution 2.5% low-MG/low-CA     . atorvastatin  10 mg Oral Daily  . benazepril  20 mg Oral Daily  . cloNIDine  0.1 mg Oral TID  . gentamicin cream  1 application Topical Daily  . hydrALAZINE  5 mg Intravenous Once  . hydrALAZINE  50 mg Oral Q8H  . insulin aspart  0-5 Units Subcutaneous QHS  . insulin aspart  0-6 Units Subcutaneous TID WC  . senna  2 tablet Oral Daily  . spironolactone  25 mg Oral Daily    Dialysis Orders:  [facility]  EDW 60kg 5 exchanges 2L 1hr 24 min dwell. No day dwell    Assessment/Plan: 1. Symptomatic anemia. Hgb 9.1 s/p 2u prbcs. Was 6.9 on admission. FOBT negative. Tsat 9% --  holding IV Fe for now d/t IV antibiotics/possible infection  2. Fevers  Blood cultures ngtd. PD fluid collected for cell count/culture today.  3. ESRD -  CCPD. Will continue PD on cycler tonight.  4. Hypertension/volume  - Blood pressure sl elevated. Is at her dry weight by weights here.  Doesn't appear grossly overloaded. UF as tolerated.  5.  Metabolic bone disease -  Continue home meds. Check phos here  6.  DM - per primary   Lynnda Child PA-C [facility] 07/23/2020,10:56 AM

## 2020-07-23 NOTE — Progress Notes (Signed)
PROGRESS NOTE    Catherine Burke  JXB:[id number] DOB: Feb 28, 1941 DOA: 07/21/2020 PCP: Idelle Crouch, MD     Brief Narrative:  Catherine Burke is a 79 y.o. female with medical history significant for diabetes mellitus, hypertension, ESRD on PD. Patient presented to the ED with complaints of lightheadedness/sensation of wooziness that started 5 days ago Friday.  She reports the symptoms occur when she is standing, and  not present when she is lying down. Patient also feels dehydrated, she describes her her skin feels wrinkly, and tongue is dry.  Also her urine has been very concentrated lately. She denies vomiting, no blood in stools, no black stools.  In the emergency department, she was found to have anemia with hemoglobin 7.  She was transfused 1 unit packed red blood cells.  Given IV Rocephin for suspected UTI.  New events last 24 hours / Subjective: Had fever overnight 100.4-100.5.  She is feeling well overall.  Underwent peritoneal dialysis overnight.  She has no complaints of chest pain, cough, nausea, vomiting, diarrhea or dysuria.  She does have some right-sided abdominal discomfort and tenderness to palpation today.  Assessment & Plan:   Principal Problem:   Acute on chronic anemia Active Problems:   Diabetes mellitus ([place])   Essential hypertension   ESRD (end stage renal disease) ([place])   Peritoneal dialysis status (HCC)   Symptomatic anemia   Symptomatic anemia with history of anemia of chronic disease -Baseline hemoglobin around 9-10, on EPO as outpatient -Denies any blood loss, blood in stool, vaginal bleeding, hematemesis.  FOBT negative on admission -Transfused 1 unit RBC 11/17, 1 unit 11/18 -Monitor CBC   Fever -Sepsis ruled out -T-max 100.5 overnight.  UA on admission was largely unremarkable.  Covid, influenza negative.  Urine culture is negative.  Blood cultures pending.  Chest x-ray negative. Check peritoneal fluid - pending, discussed with RN this needs to be  collected today -Rocephin, rule out peritonitis    ESRD on PD -Follows with Dr. Candiss [facility]se at [facility] -Nephrology following   Diabetes mellitus type 2, well controlled  -Hemoglobin A1c 6.0  -Sliding scale insulin  Essential hypertension -Continue benazepril, spironolactone, hydralazine, clonidine  Hyperlipidemia -Continue Lipitor    DVT prophylaxis:  SCDs Start: 07/22/20 0344  Code Status: Full code Family Communication: No family at bedside Disposition Plan:  Status is: Inpatient  Remains inpatient appropriate because:IV treatments appropriate due to intensity of illness or inability to take PO   Dispo: The patient is from: Home              Anticipated d/c is to: Home              Anticipated d/c date is: 2 days              Patient currently is not medically stable to d/c. Remains on IV antibiotics   Consultants:   Nephrology  Procedures:   None  Antimicrobials:  Anti-infectives (From admission, onward)   Start     Dose/Rate Route Frequency Ordered Stop   07/22/20 1230  cefTRIAXone (ROCEPHIN) 2 g in sodium chloride 0.9 % 100 mL IVPB  Status:  Discontinued        2 g 200 mL/hr over 30 Minutes Intravenous Every 24 hours 07/22/20 1128 07/22/20 1136   07/22/20 1230  cefTRIAXone (ROCEPHIN) 1 g in sodium chloride 0.9 % 100 mL IVPB        1 g 200 mL/hr over 30 Minutes Intravenous Every 24 hours  07/22/20 1136     07/21/20 2000  cefTRIAXone (ROCEPHIN) 1 g in sodium chloride 0.9 % 100 mL IVPB        1 g 200 mL/hr over 30 Minutes Intravenous  Once 07/21/20 1950 07/21/20 2045       Objective: Vitals:   07/22/20 2155 07/23/20 0019 07/23/20 0523 07/23/20 0554  BP: (!) 153/54 (!) 135/48 (!) 139/47 (!) 147/48  Pulse: 65 (!) 51 (!) 57 (!) 55  Resp: 20 20 (!) 21   Temp: 98.2 F (36.8 C) 99 F (37.2 C) (!) 100.4 F (38 C)   TempSrc: Oral Oral Oral   SpO2: 96% 96% 96%   Weight:   59.6 kg   Height:        Intake/Output Summary (Last 24 hours) at  07/23/2020 1034 Last data filed at 07/23/2020 0809 Gross per 24 hour  Intake 1280 ml  Output --  Net 1280 ml   Filed Weights   07/21/20 1256 07/22/20 0524 07/23/20 0523  Weight: 60.3 kg 60 kg 59.6 kg    Examination: General exam: Appears calm and comfortable  Respiratory system: Clear to auscultation. Respiratory effort normal. Cardiovascular system: S1 & S2 heard, RRR. No pedal edema. Gastrointestinal system: Abdomen is nondistended, soft and mildly TTP right hemiabdomen. Normal bowel sounds heard. Central nervous system: Alert and oriented. Non focal exam. Speech clear  Extremities: Symmetric in appearance bilaterally  Skin: No rashes, lesions or ulcers on exposed skin  Psychiatry: Judgement and insight appear stable. Mood & affect appropriate.     Data Reviewed: I have personally reviewed following labs and imaging studies  CBC: Recent Labs  Lab 07/21/20 1357 07/22/20 0849 07/22/20 2248 07/23/20 0123  WBC 7.4 6.5  --  6.4  HGB 7.0* 6.9* 8.3* 9.1*  HCT 22.6* 21.9* 25.8* 27.8*  MCV 86.9 86.9  --  86.3  PLT 294 259  --  297   Basic Metabolic Panel: Recent Labs  Lab 07/21/20 1357 07/22/20 0419 07/23/20 0123  NA 136 136 140  K 3.1* 3.7 3.3*  CL 99 103 105  CO2 27 24 24   GLUCOSE 119* 123* 168*  BUN 42* 44* 41*  CREATININE 3.74* 3.80* 3.94*  CALCIUM 7.8* 7.5* 7.7*   GFR: Estimated Creatinine Clearance: 9.4 mL/min (A) (by C-G formula based on SCr of 3.94 mg/dL (H)). Liver Function Tests: No results for input(s): AST, ALT, ALKPHOS, BILITOT, PROT, ALBUMIN in the last 168 hours. No results for input(s): LIPASE, AMYLASE in the last 168 hours. No results for input(s): AMMONIA in the last 168 hours. Coagulation Profile: No results for input(s): INR, PROTIME in the last 168 hours. Cardiac Enzymes: No results for input(s): CKTOTAL, CKMB, CKMBINDEX, TROPONINI in the last 168 hours. BNP (last 3 results) No results for input(s): PROBNP in the last 8760  hours. HbA1C: Recent Labs    07/22/20 0849  HGBA1C 6.0*   CBG: Recent Labs  Lab 07/22/20 1608 07/22/20 2107 07/23/20 0619 07/23/20 0805 07/23/20 0941  GLUCAP 146* 373* 146* 182* 194*   Lipid Profile: No results for input(s): CHOL, HDL, LDLCALC, TRIG, CHOLHDL, LDLDIRECT in the last 72 hours. Thyroid Function Tests: No results for input(s): TSH, T4TOTAL, FREET4, T3FREE, THYROIDAB in the last 72 hours. Anemia Panel: Recent Labs    07/21/20 1357  VITAMINB12 228  FOLATE 9.3  FERRITIN 697*  TIBC 226*  IRON 21*  RETICCTPCT 1.0   Sepsis Labs: No results for input(s): PROCALCITON, LATICACIDVEN in the last 168 hours.  Recent Results (from  the past 240 hour(s))  Urine Culture     Status: None   Collection Time: 07/21/20  6:00 PM   Specimen: Urine, Random  Result Value Ref Range Status   Specimen Description   Final    URINE, RANDOM Performed at [facility], [address] [place], [place] [address]    Special Requests   Final    NONE Performed at [facility], [address], [place], [place] [address]    Culture   Final    NO GROWTH Performed at [facility], [place] [address], [place], [place] [address]    Report Status 07/23/2020 FINAL  Final  Respiratory Panel by RT PCR (Flu A&B, Covid) - Nasopharyngeal Swab     Status: None   Collection Time: 07/21/20  8:25 PM   Specimen: Nasopharyngeal Swab  Result Value Ref Range Status   SARS Coronavirus 2 by RT PCR NEGATIVE NEGATIVE Final    Comment: (NOTE) SARS-CoV-2 target nucleic acids are NOT DETECTED.  The SARS-CoV-2 RNA is generally detectable in upper respiratoy specimens during the acute phase of infection. The lowest concentration of SARS-CoV-2 viral copies this assay can detect is 131 copies/mL. A negative result does not preclude SARS-Cov-2 infection and should not be used as the sole basis for treatment or other patient management decisions. A negative result may occur with  improper specimen  collection/handling, submission of specimen other than nasopharyngeal swab, presence of viral mutation(s) within the areas targeted by this assay, and inadequate number of viral copies (<131 copies/mL). A negative result must be combined with clinical observations, patient history, and epidemiological information. The expected result is Negative.  Fact Sheet for Patients:  PinkCheek.be  Fact Sheet for Healthcare Providers:  GravelBags.it  This test is no t yet approved or cleared by the [place] FDA and  has been authorized for detection and/or diagnosis of SARS-CoV-2 by FDA under an Emergency Use Authorization (EUA). This EUA will remain  in effect (meaning this test can be used) for the duration of the COVID-19 declaration under Section 564(b)(1) of the Act, 21 U.S.C. section 360bbb-3(b)(1), unless the authorization is terminated or revoked sooner.     Influenza A by PCR NEGATIVE NEGATIVE Final   Influenza B by PCR NEGATIVE NEGATIVE Final    Comment: (NOTE) The Xpert Xpress SARS-CoV-2/FLU/RSV assay is intended as an aid in  the diagnosis of influenza from Nasopharyngeal swab specimens and  should not be used as a sole basis for treatment. Nasal washings and  aspirates are unacceptable for Xpert Xpress SARS-CoV-2/FLU/RSV  testing.  Fact Sheet for Patients: PinkCheek.be  Fact Sheet for Healthcare Providers: GravelBags.it  This test is not yet approved or cleared by the [place] FDA and  has been authorized for detection and/or diagnosis of SARS-CoV-2 by  FDA under an Emergency Use Authorization (EUA). This EUA will remain  in effect (meaning this test can be used) for the duration of the  Covid-19 declaration under Section 564(b)(1) of the Act, 21  U.S.C. section 360bbb-3(b)(1), unless the authorization is  terminated or revoked. Performed at [facility], [address] [place], [place] [address]   Culture, blood (routine x 2)     Status: None (Preliminary result)   Collection Time: 07/22/20  8:49 AM   Specimen: BLOOD  Result Value Ref Range Status   Specimen Description BLOOD LEFT ANTECUBITAL  Final   Special Requests   Final    BOTTLES DRAWN AEROBIC AND ANAEROBIC Blood Culture results may  not be optimal due to an inadequate volume of blood received in culture bottles   Culture   Final    NO GROWTH < 12 HOURS Performed at [place] [address], [place], [place] [address]    Report Status PENDING  Incomplete  Culture, blood (routine x 2)     Status: None (Preliminary result)   Collection Time: 07/22/20  8:49 AM   Specimen: BLOOD  Result Value Ref Range Status   Specimen Description BLOOD BLOOD RIGHT HAND  Final   Special Requests   Final    BOTTLES DRAWN AEROBIC AND ANAEROBIC Blood Culture results may not be optimal due to an inadequate volume of blood received in culture bottles   Culture   Final    NO GROWTH < 12 HOURS Performed at [facility], [place] [address] [place], [place] [address]    Report Status PENDING  Incomplete      Radiology Studies: DG CHEST PORT 1 VIEW  Result Date: 07/22/2020 CLINICAL DATA:  Fever.  Dizziness for a week. EXAM: PORTABLE CHEST 1 VIEW COMPARISON:  04/28/2020 FINDINGS: Cardiac silhouette is mildly enlarged. No mediastinal or hilar masses. Chronic scarring at the left lung base. Chronic prominence of the bronchovascular markings. No evidence of pneumonia or pulmonary edema. No pleural effusion or pneumothorax. Skeletal structures are grossly intact. IMPRESSION: No acute cardiopulmonary disease. Electronically Signed   By: Lajean Manes M.D.   On: 07/22/2020 08:10      Scheduled Meds: . atorvastatin  10 mg Oral Daily  . benazepril  20 mg Oral Daily  . cloNIDine  0.1 mg Oral TID  . gentamicin cream  1 application Topical Daily  . hydrALAZINE  5 mg Intravenous Once  .  hydrALAZINE  50 mg Oral Q8H  . insulin aspart  0-5 Units Subcutaneous QHS  . insulin aspart  0-6 Units Subcutaneous TID WC  . senna  2 tablet Oral Daily  . spironolactone  25 mg Oral Daily   Continuous Infusions: . cefTRIAXone (ROCEPHIN)  IV 1 g (07/22/20 1232)  . dialysis solution 2.5% low-MG/low-CA       LOS: 1 day      Time spent: 35 minutes   Dessa Phi, DO [facility] 07/23/2020, 10:34 AM   Available via Epic secure chat 7am-7pm After these hours, please refer to coverage provider listed on [email or web address]

## 2020-07-23 NOTE — Plan of Care (Signed)

## 2020-07-24 ENCOUNTER — Encounter (HOSPITAL_COMMUNITY): Payer: Medicare PPO

## 2020-07-24 DIAGNOSIS — D649 Anemia, unspecified: Secondary | ICD-10-CM | POA: Diagnosis not present

## 2020-07-24 LAB — BASIC METABOLIC PANEL
Anion gap: 13 (ref 5–15)
BUN: 46 mg/dL — ABNORMAL HIGH (ref 8–23)
CO2: 23 mmol/L (ref 22–32)
Calcium: 8.2 mg/dL — ABNORMAL LOW (ref 8.9–10.3)
Chloride: 105 mmol/L (ref 98–111)
Creatinine, Ser: 3.78 mg/dL — ABNORMAL HIGH (ref 0.44–1.00)
GFR, Estimated: 12 mL/min — ABNORMAL LOW (ref >60)
Glucose, Bld: 216 mg/dL — ABNORMAL HIGH (ref 70–99)
Potassium: 3.6 mmol/L (ref 3.5–5.1)
Sodium: 141 mmol/L (ref 135–145)

## 2020-07-24 LAB — GLUCOSE, CAPILLARY
Glucose-Capillary: 230 mg/dL — ABNORMAL HIGH (ref 70–99)
Glucose-Capillary: 154 mg/dL — ABNORMAL HIGH (ref 70–99)
Glucose-Capillary: 203 mg/dL — ABNORMAL HIGH (ref 70–99)
Glucose-Capillary: 253 mg/dL — ABNORMAL HIGH (ref 70–99)

## 2020-07-24 LAB — CBC
HCT: 28 % — ABNORMAL LOW (ref 36.0–46.0)
Hemoglobin: 8.8 g/dL — ABNORMAL LOW (ref 12.0–15.0)
MCH: 27.5 pg (ref 26.0–34.0)
MCHC: 31.4 g/dL (ref 30.0–36.0)
MCV: 87.5 fL (ref 80.0–100.0)
Platelets: 227 10*3/uL (ref 150–400)
RBC: 3.2 MIL/uL — ABNORMAL LOW (ref 3.87–5.11)
RDW: 15 % (ref 11.5–15.5)
WBC: 4.3 10*3/uL (ref 4.0–10.5)
nRBC: 0 % (ref 0.0–0.2)

## 2020-07-24 LAB — VITAMIN B12: Vitamin B-12: 222 pg/mL (ref 180–914)

## 2020-07-24 LAB — PHOSPHORUS: Phosphorus: 4.1 mg/dL (ref 2.5–4.6)

## 2020-07-24 MED ORDER — GENTAMICIN SULFATE 0.1 % EX CREA: 1.0000 "application " | TOPICAL_CREAM | Freq: Every day | CUTANEOUS | Status: DC

## 2020-07-24 MED ORDER — DELFLEX-LC/2.5% DEXTROSE 394 MOSM/L IP SOLN: INTRAPERITONEAL | Status: DC

## 2020-07-24 NOTE — Progress Notes (Signed)
PROGRESS NOTE    Catherine Burke  WER:[id number] DOB: 01/16/1941 DOA: 07/21/2020 PCP: Idelle Crouch, MD     Brief Narrative:  Catherine Burke is a 79 y.o. female with medical history significant for diabetes mellitus, hypertension, ESRD on PD. Patient presented to the ED with complaints of lightheadedness/sensation of wooziness that started 5 days ago Friday.  She reports the symptoms occur when she is standing, and  not present when she is lying down. Patient also feels dehydrated, she describes her her skin feels wrinkly, and tongue is dry.  Also her urine has been very concentrated lately. She denies vomiting, no blood in stools, no black stools.  In the emergency department, she was found to have anemia with hemoglobin 7.  She was transfused 1 unit packed red blood cells.  Given IV Rocephin for suspected UTI.  New events last 24 hours / Subjective: Peritoneal fluid negative for peritonitis.  Has remained afebrile last 24 hours.  She has no complaints on examination.  Assessment & Plan:   Principal Problem:   Acute on chronic anemia Active Problems:   Diabetes mellitus ([place])   Essential hypertension   ESRD (end stage renal disease) ([place])   Peritoneal dialysis status (HCC)   Symptomatic anemia   Symptomatic anemia with history of anemia of chronic disease -Baseline hemoglobin around 9-10, on EPO as outpatient -Denies any blood loss, blood in stool, vaginal bleeding, hematemesis.  FOBT negative on admission -Transfused 1 unit RBC 11/17, 1 unit 11/18 -Monitor CBC   Fever -Sepsis ruled out -UA on admission was largely unremarkable.  Covid, influenza negative.  Urine culture is negative.  Blood cultures negative to date.  Chest x-ray negative.  Peritoneal fluid negative for peritonitis -Monitor for fever off antibiotic -Rule out DVT as source of fever with DVT ultrasound although low suspicion for this  ESRD on PD -Follows with Dr. Candiss Norse at [facility] -Nephrology  following   Diabetes mellitus type 2, well controlled  -Hemoglobin A1c 6.0  -Sliding scale insulin  Essential hypertension -Continue benazepril, spironolactone, hydralazine, clonidine  Hyperlipidemia -Continue Lipitor    DVT prophylaxis:  SCDs Start: 07/22/20 0344  Code Status: Full code Family Communication: No family at bedside Disposition Plan:  Status is: Inpatient  Remains inpatient appropriate because:Ongoing diagnostic testing needed not appropriate for outpatient work up   Dispo: The patient is from: Home              Anticipated d/c is to: Home              Anticipated d/c date is: 1 day              Patient currently is not medically stable to d/c.  Rule out DVT with venous ultrasound.  Continue to watch for fever.  If remains afebrile next 24 hours, plan for discharge home 11/21   Consultants:   Nephrology  Procedures:   None  Antimicrobials:  Anti-infectives (From admission, onward)   Start     Dose/Rate Route Frequency Ordered Stop   07/22/20 1230  cefTRIAXone (ROCEPHIN) 2 g in sodium chloride 0.9 % 100 mL IVPB  Status:  Discontinued        2 g 200 mL/hr over 30 Minutes Intravenous Every 24 hours 07/22/20 1128 07/22/20 1136   07/22/20 1230  cefTRIAXone (ROCEPHIN) 1 g in sodium chloride 0.9 % 100 mL IVPB  Status:  Discontinued        1 g 200 mL/hr over 30 Minutes Intravenous  Every 24 hours 07/22/20 1136 07/23/20 1403   07/21/20 2000  cefTRIAXone (ROCEPHIN) 1 g in sodium chloride 0.9 % 100 mL IVPB        1 g 200 mL/hr over 30 Minutes Intravenous  Once 07/21/20 1950 07/21/20 2045       Objective: Vitals:   07/23/20 1942 07/24/20 0329 07/24/20 0334 07/24/20 0914  BP: (!) 174/48 (!) 159/54  (!) 166/51  Pulse: (!) 45 (!) 44  (!) 45  Resp: 18 18  18   Temp: 98.5 F (36.9 C) 98.9 F (37.2 C)  98.3 F (36.8 C)  TempSrc: Oral Oral  Oral  SpO2: 95% 97%  97%  Weight:   61.6 kg   Height:        Intake/Output Summary (Last 24 hours) at 07/24/2020  1048 Last data filed at 07/24/2020 0500 Gross per 24 hour  Intake 10680 ml  Output 11851 ml  Net -1171 ml   Filed Weights   07/22/20 0524 07/23/20 0523 07/24/20 0334  Weight: 60 kg 59.6 kg 61.6 kg    Examination: General exam: Appears calm and comfortable  Respiratory system: Clear to auscultation. Respiratory effort normal. Cardiovascular system: S1 & S2 heard, RRR. No pedal edema. Gastrointestinal system: Abdomen is nondistended, soft and nontender. Normal bowel sounds heard. Central nervous system: Alert and oriented. Non focal exam. Speech clear  Extremities: Symmetric in appearance bilaterally  Skin: No rashes, lesions or ulcers on exposed skin  Psychiatry: Judgement and insight appear stable. Mood & affect appropriate.   Data Reviewed: I have personally reviewed following labs and imaging studies  CBC: Recent Labs  Lab 07/21/20 1357 07/22/20 0849 07/22/20 2248 07/23/20 0123 07/24/20 0338  WBC 7.4 6.5  --  6.4 4.3  HGB 7.0* 6.9* 8.3* 9.1* 8.8*  HCT 22.6* 21.9* 25.8* 27.8* 28.0*  MCV 86.9 86.9  --  86.3 87.5  PLT 294 259  --  236 093   Basic Metabolic Panel: Recent Labs  Lab 07/21/20 1357 07/22/20 0419 07/23/20 0123 07/24/20 0338  NA 136 136 140 141  K 3.1* 3.7 3.3* 3.6  CL 99 103 105 105  CO2 27 24 24 23   GLUCOSE 119* 123* 168* 216*  BUN 42* 44* 41* 46*  CREATININE 3.74* 3.80* 3.94* 3.78*  CALCIUM 7.8* 7.5* 7.7* 8.2*  PHOS  --   --   --  4.1   GFR: Estimated Creatinine Clearance: 9.8 mL/min (A) (by C-G formula based on SCr of 3.78 mg/dL (H)). Liver Function Tests: No results for input(s): AST, ALT, ALKPHOS, BILITOT, PROT, ALBUMIN in the last 168 hours. No results for input(s): LIPASE, AMYLASE in the last 168 hours. No results for input(s): AMMONIA in the last 168 hours. Coagulation Profile: No results for input(s): INR, PROTIME in the last 168 hours. Cardiac Enzymes: No results for input(s): CKTOTAL, CKMB, CKMBINDEX, TROPONINI in the last 168  hours. BNP (last 3 results) No results for input(s): PROBNP in the last 8760 hours. HbA1C: Recent Labs    07/22/20 0849  HGBA1C 6.0*   CBG: Recent Labs  Lab 07/23/20 0941 07/23/20 1113 07/23/20 1634 07/23/20 2111 07/24/20 0609  GLUCAP 194* 141* 140* 255* 230*   Lipid Profile: No results for input(s): CHOL, HDL, LDLCALC, TRIG, CHOLHDL, LDLDIRECT in the last 72 hours. Thyroid Function Tests: No results for input(s): TSH, T4TOTAL, FREET4, T3FREE, THYROIDAB in the last 72 hours. Anemia Panel: Recent Labs    07/21/20 1357  VITAMINB12 228  FOLATE 9.3  FERRITIN 697*  TIBC 226*  IRON 21*  RETICCTPCT 1.0   Sepsis Labs: No results for input(s): PROCALCITON, LATICACIDVEN in the last 168 hours.  Recent Results (from the past 240 hour(s))  Urine Culture     Status: None   Collection Time: 07/21/20  6:00 PM   Specimen: Urine, Random  Result Value Ref Range Status   Specimen Description   Final    URINE, RANDOM Performed at [facility], [address], [place], [place] [address]    Special Requests   Final    NONE Performed at [facility], [address] [place], [place] [address]    Culture   Final    NO GROWTH Performed at [facility], [place] [address], [place], [place] [address]    Report Status 07/23/2020 FINAL  Final  Respiratory Panel by RT PCR (Flu A&B, Covid) - Nasopharyngeal Swab     Status: None   Collection Time: 07/21/20  8:25 PM   Specimen: Nasopharyngeal Swab  Result Value Ref Range Status   SARS Coronavirus 2 by RT PCR NEGATIVE NEGATIVE Final    Comment: (NOTE) SARS-CoV-2 target nucleic acids are NOT DETECTED.  The SARS-CoV-2 RNA is generally detectable in upper respiratoy specimens during the acute phase of infection. The lowest concentration of SARS-CoV-2 viral copies this assay can detect is 131 copies/mL. A negative result does not preclude SARS-Cov-2 infection and should not be used as the sole basis for treatment or other patient  management decisions. A negative result may occur with  improper specimen collection/handling, submission of specimen other than nasopharyngeal swab, presence of viral mutation(s) within the areas targeted by this assay, and inadequate number of viral copies (<131 copies/mL). A negative result must be combined with clinical observations, patient history, and epidemiological information. The expected result is Negative.  Fact Sheet for Patients:  PinkCheek.be  Fact Sheet for Healthcare Providers:  GravelBags.it  This test is no t yet approved or cleared by the [place] FDA and  has been authorized for detection and/or diagnosis of SARS-CoV-2 by FDA under an Emergency Use Authorization (EUA). This EUA will remain  in effect (meaning this test can be used) for the duration of the COVID-19 declaration under Section 564(b)(1) of the Act, 21 U.S.C. section 360bbb-3(b)(1), unless the authorization is terminated or revoked sooner.     Influenza A by PCR NEGATIVE NEGATIVE Final   Influenza B by PCR NEGATIVE NEGATIVE Final    Comment: (NOTE) The Xpert Xpress SARS-CoV-2/FLU/RSV assay is intended as an aid in  the diagnosis of influenza from Nasopharyngeal swab specimens and  should not be used as a sole basis for treatment. Nasal washings and  aspirates are unacceptable for Xpert Xpress SARS-CoV-2/FLU/RSV  testing.  Fact Sheet for Patients: PinkCheek.be  Fact Sheet for Healthcare Providers: GravelBags.it  This test is not yet approved or cleared by the [place] FDA and  has been authorized for detection and/or diagnosis of SARS-CoV-2 by  FDA under an Emergency Use Authorization (EUA). This EUA will remain  in effect (meaning this test can be used) for the duration of the  Covid-19 declaration under Section 564(b)(1) of the Act, 21  U.S.C. section 360bbb-3(b)(1),  unless the authorization is  terminated or revoked. Performed at [facility], [address] [place],  [address]   Culture, blood (routine x 2)     Status: None (Preliminary result)   Collection Time: 07/22/20  8:49 AM   Specimen: BLOOD  Result Value Ref Range Status   Specimen Description  BLOOD LEFT ANTECUBITAL  Final   Special Requests   Final    BOTTLES DRAWN AEROBIC AND ANAEROBIC Blood Culture results may not be optimal due to an inadequate volume of blood received in culture bottles   Culture   Final    NO GROWTH 1 DAY Performed at [facility] [address], [place], [place] [address]    Report Status PENDING  Incomplete  Culture, blood (routine x 2)     Status: None (Preliminary result)   Collection Time: 07/22/20  8:49 AM   Specimen: BLOOD  Result Value Ref Range Status   Specimen Description BLOOD BLOOD RIGHT HAND  Final   Special Requests   Final    BOTTLES DRAWN AEROBIC AND ANAEROBIC Blood Culture results may not be optimal due to an inadequate volume of blood received in culture bottles   Culture   Final    NO GROWTH 1 DAY Performed at [facility], [place] [address], [place], [place] [address]    Report Status PENDING  Incomplete  Body fluid culture     Status: None (Preliminary result)   Collection Time: 07/23/20 10:40 AM   Specimen: Body Fluid  Result Value Ref Range Status   Specimen Description FLUID  Final   Special Requests PERITONEAL DIALYSATE  Final   Gram Stain   Final    RARE WBC PRESENT, PREDOMINANTLY MONONUCLEAR NO ORGANISMS SEEN    Culture   Final    NO GROWTH < 24 HOURS Performed at [facility], [address], [place], [place] [address]    Report Status PENDING  Incomplete      Radiology Studies: No results found.    Scheduled Meds: . atorvastatin  10 mg Oral Daily  . benazepril  20 mg Oral Daily  . cloNIDine  0.1 mg Oral TID  . gentamicin cream  1 application Topical Daily  . hydrALAZINE  5 mg Intravenous  Once  . hydrALAZINE  50 mg Oral Q8H  . insulin aspart  0-5 Units Subcutaneous QHS  . insulin aspart  0-6 Units Subcutaneous TID WC  . senna  2 tablet Oral Daily  . spironolactone  25 mg Oral Daily   Continuous Infusions: . dialysis solution 2.5% low-MG/low-CA       LOS: 2 days      Time spent: 9minutes   Dessa Phi, DO [facility] 07/24/2020, 10:48 AM   Available via Epic secure chat 7am-7pm After these hours, please refer to coverage provider listed on [email or web address]

## 2020-07-24 NOTE — Progress Notes (Signed)
[facility] Progress Note   Subjective:   Feeling well today, denies SOB, CP, palpitations, dizziness, abdominal pain. No cloudy PD fluid and peritoneal fluid negative for peritonitis. BC NGTD. Feels her hemoglobin was low due to poor nutrition lately.   Objective Vitals:   07/23/20 1942 07/24/20 0329 07/24/20 0334 07/24/20 0914  BP: (!) 174/48 (!) 159/54  (!) 166/51  Pulse: (!) 45 (!) 44  (!) 45  Resp: 18 18  18   Temp: 98.5 F (36.9 C) 98.9 F (37.2 C)  98.3 F (36.8 C)  TempSrc: Oral Oral  Oral  SpO2: 95% 97%  97%  Weight:   61.6 kg   Height:       Physical Exam General:  Well developed, alert and in NAD Heart: Slightly bradycardic, regular rhythm, no murmur Lungs: CTA bilaterally without wheezing, rhonchi or rales Abdomen: Soft, non-tender, non-distended ,+BS, PD cath in place Extremities: No edema b/l lower extremities  Additional Objective Labs: Basic Metabolic Panel: Recent Labs  Lab 07/22/20 0419 07/23/20 0123 07/24/20 0338  NA 136 140 141  K 3.7 3.3* 3.6  CL 103 105 105  CO2 24 24 23   GLUCOSE 123* 168* 216*  BUN 44* 41* 46*  CREATININE 3.80* 3.94* 3.78*  CALCIUM 7.5* 7.7* 8.2*  PHOS  --   --  4.1   CBC: Recent Labs  Lab 07/21/20 1357 07/21/20 1357 07/22/20 0849 07/22/20 0849 07/22/20 2248 07/23/20 0123 07/24/20 0338  WBC 7.4   < > 6.5  --   --  6.4 4.3  HGB 7.0*   < > 6.9*   < > 8.3* 9.1* 8.8*  HCT 22.6*   < > 21.9*   < > 25.8* 27.8* 28.0*  MCV 86.9  --  86.9  --   --  86.3 87.5  PLT 294   < > 259  --   --  236 227   < > = values in this interval not displayed.   Blood Culture    Component Value Date/Time   SDES FLUID 07/23/2020 1040   SPECREQUEST PERITONEAL DIALYSATE 07/23/2020 1040   CULT  07/23/2020 1040    NO GROWTH < 24 HOURS Performed at [facility], [place] [address] [place], [place] [address]    REPTSTATUS PENDING 07/23/2020 1040   CBG: Recent Labs  Lab 07/23/20 1113 07/23/20 1634 07/23/20 2111  07/24/20 0609 07/24/20 1100  GLUCAP 141* 140* 255* 230* 154*   Iron Studies:  Recent Labs    07/21/20 1357  IRON 21*  TIBC 226*  FERRITIN 697*   Medications: . dialysis solution 2.5% low-MG/low-CA     . atorvastatin  10 mg Oral Daily  . benazepril  20 mg Oral Daily  . cloNIDine  0.1 mg Oral TID  . gentamicin cream  1 application Topical Daily  . hydrALAZINE  5 mg Intravenous Once  . hydrALAZINE  50 mg Oral Q8H  . insulin aspart  0-5 Units Subcutaneous QHS  . insulin aspart  0-6 Units Subcutaneous TID WC  . senna  2 tablet Oral Daily  . spironolactone  25 mg Oral Daily    Dialysis Orders: [facility]  EDW 60kg 5 exchanges 2L 1hr 24 min dwell. No day dwell   Assessment/Plan: 1. Symptomatic anemia. Hgb 8.8 s/p 2u prbcs. Was 6.9 on admission. FOBT negative. Tsat 9% --holding IV Fe for now d/t IV antibiotics/possible infection, replete once infection is resolved/ruled out. No ESA on med list but appears she was getting  B12 injections in 03/2020. Will recheck B12 level.  2. Fevers  Blood cultures ngtd. PD fluid collected for cell count/culture today.  3. ESRD -CCPD. Will continue PD on cycler tonight.  4. Hypertension/volume - Blood pressure sl elevated. Is at her dry weight by weights here. Doesn't appear grossly overloaded. UF as tolerated.  5. Metabolic bone disease - Continue home meds. Check phos here  6. DM - per primary    Anice Paganini, PA-C 07/24/2020, 11:48 AM   [facility] Pager: [phone]

## 2020-07-25 ENCOUNTER — Inpatient Hospital Stay (HOSPITAL_COMMUNITY): Payer: Medicare PPO

## 2020-07-25 DIAGNOSIS — R609 Edema, unspecified: Secondary | ICD-10-CM | POA: Diagnosis not present

## 2020-07-25 DIAGNOSIS — D649 Anemia, unspecified: Secondary | ICD-10-CM | POA: Diagnosis not present

## 2020-07-25 LAB — BASIC METABOLIC PANEL
Anion gap: 12 (ref 5–15)
BUN: 42 mg/dL — ABNORMAL HIGH (ref 8–23)
CO2: 25 mmol/L (ref 22–32)
Calcium: 8.3 mg/dL — ABNORMAL LOW (ref 8.9–10.3)
Chloride: 104 mmol/L (ref 98–111)
Creatinine, Ser: 3.92 mg/dL — ABNORMAL HIGH (ref 0.44–1.00)
GFR, Estimated: 11 mL/min — ABNORMAL LOW (ref >60)
Glucose, Bld: 218 mg/dL — ABNORMAL HIGH (ref 70–99)
Potassium: 3.5 mmol/L (ref 3.5–5.1)
Sodium: 141 mmol/L (ref 135–145)

## 2020-07-25 LAB — CBC
HCT: 28.5 % — ABNORMAL LOW (ref 36.0–46.0)
Hemoglobin: 9 g/dL — ABNORMAL LOW (ref 12.0–15.0)
MCH: 27.4 pg (ref 26.0–34.0)
MCHC: 31.6 g/dL (ref 30.0–36.0)
MCV: 86.9 fL (ref 80.0–100.0)
Platelets: 220 10*3/uL (ref 150–400)
RBC: 3.28 MIL/uL — ABNORMAL LOW (ref 3.87–5.11)
RDW: 14.7 % (ref 11.5–15.5)
WBC: 4.9 10*3/uL (ref 4.0–10.5)
nRBC: 0 % (ref 0.0–0.2)

## 2020-07-25 LAB — GLUCOSE, CAPILLARY
Glucose-Capillary: 189 mg/dL — ABNORMAL HIGH (ref 70–99)
Glucose-Capillary: 247 mg/dL — ABNORMAL HIGH (ref 70–99)

## 2020-07-25 LAB — HEPATITIS B SURFACE ANTIGEN: Hepatitis B Surface Ag: NONREACTIVE

## 2020-07-25 MED ORDER — GENTAMICIN SULFATE 0.1 % EX CREA
1.0000 "application " | TOPICAL_CREAM | Freq: Every day | CUTANEOUS | Status: DC
  Filled 2020-07-25: qty 15

## 2020-07-25 MED ORDER — DELFLEX-LC/4.25% DEXTROSE 483 MOSM/L IP SOLN: INTRAPERITONEAL | Status: DC

## 2020-07-25 MED ORDER — DELFLEX-LC/2.5% DEXTROSE 394 MOSM/L IP SOLN: INTRAPERITONEAL | Status: DC

## 2020-07-25 MED ORDER — HEPARIN 1000 UNIT/ML FOR PERITONEAL DIALYSIS
500.0000 [IU] | INTRAMUSCULAR | Status: DC | PRN
  Filled 2020-07-25: qty 0.5

## 2020-07-25 NOTE — Progress Notes (Signed)
[facility] Progress Note   Subjective:   Feels well, no SOB, CP, palpitations, dizziness, GI upset. Weight is coming down with PD. Says her abdomen feels a bit full, reports she sometimes needs 4.25% dextrose exchanges at home.   Objective Vitals:   07/24/20 1151 07/24/20 1818 07/24/20 1937 07/25/20 0457  BP: (!) 164/59 (!) 177/57 (!) 187/60 (!) 153/51  Pulse: (!) 41 (!) 41 (!) 47 (!) 50  Resp: 20 17 18    Temp: 98.6 F (37 C) 98.5 F (36.9 C) 98 F (36.7 C) 98.6 F (37 C)  TempSrc: Oral Oral Oral Oral  SpO2: 98% 97% 96% 95%  Weight:    59.2 kg  Height:       Physical Exam General:  Well developed, alert and in NAD Heart: Slightly bradycardic, regular rhythm, no murmur Lungs: CTA bilaterally without wheezing, rhonchi or rales Abdomen: Soft, non-tender, non-distended ,+BS, PD cath in place Extremities: No edema b/l lower extremities   Additional Objective Labs: Basic Metabolic Panel: Recent Labs  Lab 07/23/20 0123 07/24/20 0338 07/25/20 0430  NA 140 141 141  K 3.3* 3.6 3.5  CL 105 105 104  CO2 24 23 25   GLUCOSE 168* 216* 218*  BUN 41* 46* 42*  CREATININE 3.94* 3.78* 3.92*  CALCIUM 7.7* 8.2* 8.3*  PHOS  --  4.1  --    CBC: Recent Labs  Lab 07/21/20 1357 07/21/20 1357 07/22/20 0849 07/22/20 2248 07/23/20 0123 07/24/20 0338 07/25/20 0430  WBC 7.4   < > 6.5  --  6.4 4.3 4.9  HGB 7.0*   < > 6.9*   < > 9.1* 8.8* 9.0*  HCT 22.6*   < > 21.9*   < > 27.8* 28.0* 28.5*  MCV 86.9  --  86.9  --  86.3 87.5 86.9  PLT 294   < > 259  --  236 227 220   < > = values in this interval not displayed.   Blood Culture    Component Value Date/Time   SDES FLUID 07/23/2020 1040   SPECREQUEST PERITONEAL DIALYSATE 07/23/2020 1040   CULT  07/23/2020 1040    NO GROWTH < 24 HOURS Performed at [facility], [place] [address], [place], [place] [address]    REPTSTATUS PENDING 07/23/2020 1040   CBG: Recent Labs  Lab 07/24/20 0609 07/24/20 1100  07/24/20 1610 07/24/20 2117 07/25/20 0538  GLUCAP 230* 154* 203* 253* 189*   Medications: . dialysis solution 2.5% low-MG/low-CA     . atorvastatin  10 mg Oral Daily  . benazepril  20 mg Oral Daily  . cloNIDine  0.1 mg Oral TID  . gentamicin cream  1 application Topical Daily  . hydrALAZINE  5 mg Intravenous Once  . hydrALAZINE  50 mg Oral Q8H  . insulin aspart  0-5 Units Subcutaneous QHS  . insulin aspart  0-6 Units Subcutaneous TID WC  . senna  2 tablet Oral Daily  . spironolactone  25 mg Oral Daily    Dialysis Orders: [facility]  EDW 60kg 5 exchanges 2L 1hr 24 min dwell. No day dwell  Assessment/Plan: 1. Symptomatic anemia.Hgb 9 s/p 2u prbcs- stable. Was 6.9 on admission.FOBT negative.Tsat 9% --holding IV Fe for now d/t IV antibiotics/possible infection, replete once infection is resolved/ruled out. Hx B12 deficiency but B12 currently at goal. 2. Fevers-Blood cultures ngtd. PD fluid no evidence of infection. Reports fevers have resolved.  3. ESRD -CCPD. Will continue PD on cycler tonight.Ok to discharge from  renal perspective.  4. Hypertension/volume - Blood pressuresl elevated. Is below her dry weight by weights here but still feels she has some excess fluid. Will use one exchange of 4.25% dextrose tonight, pt advised to do so at home if she is discharged today, then follow up with her outpatient nephrologist. EDW likely needs to be lowered.  5. Metabolic bone disease - Calcium and phosphorus at goal.  6. DM - per primary  Anice Paganini, PA-C 07/25/2020, 9:40 AM  [facility] Pager: [phone]

## 2020-07-25 NOTE — Progress Notes (Signed)
VASCULAR LAB    Bilateral lower extremity venous duplex has been performed.  See CV proc for preliminary results.   Ivanell Deshotel, RVT 07/25/2020, 1:25 PM

## 2020-07-25 NOTE — Discharge Summary (Signed)
Physician Discharge Summary  Catherine Burke KXF:[id number] DOB: Jan 26, 1941 DOA: 07/21/2020  PCP: Idelle Crouch, MD  Admit date: 07/21/2020 Discharge date: 07/25/2020  Admitted From: Home Disposition:  Home  Recommendations for Outpatient Follow-up:  1. Follow up with PCP in 1 week  Discharge Condition: Stable CODE STATUS: Full  Diet recommendation:  Diet Orders (From admission, onward)    Start     Ordered   07/22/20 0344  Diet heart healthy/carb modified Room service appropriate? Yes; Fluid consistency: Thin  Diet effective now       Question Answer Comment  Diet-HS Snack? Nothing   Room service appropriate? Yes   Fluid consistency: Thin      07/22/20 0343         Brief/Interim Summary: Catherine Buntin Busickis a 79 y.o.femalewith medical history significant fordiabetes mellitus, hypertension, ESRD on PD. Patient presented to the ED with complaints of lightheadedness/sensation of wooziness that started5days ago Friday. She reports the symptoms occur when she is standing, and not present when she is lying down. Patient also feels dehydrated, she describes her her skin feels wrinkly, and tongue is dry. Also her urine has been very concentrated lately. She denies vomiting, no blood in stools, no black stools.  In the emergency department, she was found to have anemia with hemoglobin 7.  She was transfused 1 unit packed red blood cells.  Given IV Rocephin for suspected UTI and peritonitis. Her cultures returned negative and antibiotics were stopped. DVT US to rule out clot as source of fever was negative.  Patient remained stable for discharge home  Discharge Diagnoses:  Principal Problem:   Acute on chronic anemia Active Problems:   Diabetes mellitus ([place])   Essential hypertension   ESRD (end stage renal disease) ([place])   Peritoneal dialysis status (HCC)   Symptomatic anemia  Symptomatic anemia with history of anemia of chronic disease -Baseline hemoglobin around 9-10, on  EPO as outpatient -Denies any blood loss, blood in stool, vaginal bleeding, hematemesis.  FOBT negative on admission -Transfused 1 unit RBC 11/17, 1 unit 11/18 -Monitor CBC, hemoglobin stable this morning 9.0  Fever -Sepsis ruled out -UA on admission was largely unremarkable.  Covid, influenza negative.  Urine culture is negative.  Blood cultures negative to date.  Chest x-ray negative.  Peritoneal fluid negative for peritonitis -Monitor for fever off antibiotic -Rule out DVT as source of fever with DVT ultrasound although low suspicion for this -result is negative -Has remained afebrile last 48 hours off of antibiotic  ESRD on PD -Follows with Dr. Candiss Norse at [facility] -Nephrology following   Diabetes mellitus type 2, well controlled  -Hemoglobin A1c 6.0  -Sliding scale insulin  Essential hypertension -Continue benazepril, spironolactone, hydralazine, clonidine  Hyperlipidemia -Continue Lipitor   Discharge Instructions  Discharge Instructions    Call MD for:  difficulty breathing, headache or visual disturbances   Complete by: As directed    Call MD for:  extreme fatigue   Complete by: As directed    Call MD for:  persistant dizziness or light-headedness   Complete by: As directed    Call MD for:  persistant nausea and vomiting   Complete by: As directed    Call MD for:  severe uncontrolled pain   Complete by: As directed    Call MD for:  temperature >100.4   Complete by: As directed    Discharge instructions   Complete by: As directed    You were cared for by a hospitalist during your  hospital stay. If you have any questions about your discharge medications or the care you received while you were in the hospital after you are discharged, you can call the unit and ask to speak with the hospitalist on call if the hospitalist that took care of you is not available. Once you are discharged, your primary care physician will handle any further medical issues. Please  note that NO REFILLS for any discharge medications will be authorized once you are discharged, as it is imperative that you return to your primary care physician (or establish a relationship with a primary care physician if you do not have one) for your aftercare needs so that they can reassess your need for medications and monitor your lab values.   Increase activity slowly   Complete by: As directed    No wound care   Complete by: As directed      Allergies as of 07/25/2020      Reactions   Penicillins Anaphylaxis      Medication List    STOP taking these medications   HYDROcodone-acetaminophen 5-325 MG tablet Commonly known as: Norco     TAKE these medications   Accu-Chek Aviva Plus test strip Generic drug: glucose blood TESTING TWICE DAILY.   atorvastatin 20 MG tablet Commonly known as: LIPITOR Take 1 tablet (20 mg total) by mouth daily. What changed: how much to take   benazepril 20 MG tablet Commonly known as: LOTENSIN Take by mouth.   cloNIDine 0.1 MG tablet Commonly known as: CATAPRES Take 1 tablet by mouth 3 (three) times daily.   diclofenac Sodium 1 % Gel Commonly known as: VOLTAREN Apply 2 g topically daily as needed.   Ferrous Sulfate 134 MG Tabs Take by mouth.   hydrALAZINE 100 MG tablet Commonly known as: APRESOLINE Take 1 tablet (100 mg total) by mouth every 8 (eight) hours. What changed: how much to take   Insulin Pen Needle 31G X 5 MM Misc As directed   Lantus SoloStar 100 UNIT/ML Solostar Pen Generic drug: insulin glargine Inject 20 Units into the skin at bedtime. What changed: Another medication with the same name was removed. Continue taking this medication, and follow the directions you see here.   omeprazole 40 MG capsule Commonly known as: PRILOSEC Take 40 mg by mouth daily.   potassium chloride 10 MEQ tablet Commonly known as: KLOR-CON Take 10 mEq by mouth daily.   senna 8.6 MG tablet Commonly known as: SENOKOT Take 2 tablets  by mouth daily.   spironolactone 25 MG tablet Commonly known as: ALDACTONE Take 1 tablet (25 mg total) by mouth daily.   torsemide 20 MG tablet Commonly known as: DEMADEX Take 20 mg by mouth daily as needed.   Tylenol 325 MG Caps Generic drug: Acetaminophen Take 1 capsule by mouth daily as needed.       Follow-up Information    Sparks, Leonie Douglas, MD. Schedule an appointment as soon as possible for a visit.   Specialty: Internal Medicine Contact information: [place] [address] [phone]              Allergies  Allergen Reactions  . Penicillins Anaphylaxis    Consultations:  Nephrology   Procedures/Studies: DG CHEST PORT 1 VIEW  Result Date: 07/22/2020 CLINICAL DATA:  Fever.  Dizziness for a week. EXAM: PORTABLE CHEST 1 VIEW COMPARISON:  04/28/2020 FINDINGS: Cardiac silhouette is mildly enlarged. No mediastinal or hilar masses. Chronic scarring at the left lung  base. Chronic prominence of the bronchovascular markings. No evidence of pneumonia or pulmonary edema. No pleural effusion or pneumothorax. Skeletal structures are grossly intact. IMPRESSION: No acute cardiopulmonary disease. Electronically Signed   By: Lajean Manes M.D.   On: 07/22/2020 08:10         Discharge Exam: Vitals:   07/24/20 1937 07/25/20 0457  BP: (!) 187/60 (!) 153/51  Pulse: (!) 47 (!) 50  Resp: 18   Temp: 98 F (36.7 C) 98.6 F (37 C)  SpO2: 96% 95%    General: Pt is alert, awake, not in acute distress Cardiovascular: RRR, S1/S2 +, no edema Respiratory: CTA bilaterally, no wheezing, no rhonchi, no respiratory distress, no conversational dyspnea  Abdominal: Soft, NT, ND, bowel sounds + Extremities: no edema, no cyanosis Psych: Normal mood and affect, stable judgement and insight     The results of significant diagnostics from this hospitalization (including imaging, microbiology, ancillary and laboratory) are listed below for  reference.     Microbiology: Recent Results (from the past 240 hour(s))  Urine Culture     Status: None   Collection Time: 07/21/20  6:00 PM   Specimen: Urine, Random  Result Value Ref Range Status   Specimen Description   Final    URINE, RANDOM Performed at [facility], [address], [place], [place] [address]    Special Requests   Final    NONE Performed at [facility], [address], [place], [place] [address]    Culture   Final    NO GROWTH Performed at [facility], [place] [address], [place], [place] [address]    Report Status 07/23/2020 FINAL  Final  Respiratory Panel by RT PCR (Flu A&B, Covid) - Nasopharyngeal Swab     Status: None   Collection Time: 07/21/20  8:25 PM   Specimen: Nasopharyngeal Swab  Result Value Ref Range Status   SARS Coronavirus 2 by RT PCR NEGATIVE NEGATIVE Final    Comment: (NOTE) SARS-CoV-2 target nucleic acids are NOT DETECTED.  The SARS-CoV-2 RNA is generally detectable in upper respiratoy specimens during the acute phase of infection. The lowest concentration of SARS-CoV-2 viral copies this assay can detect is 131 copies/mL. A negative result does not preclude SARS-Cov-2 infection and should not be used as the sole basis for treatment or other patient management decisions. A negative result may occur with  improper specimen collection/handling, submission of specimen other than nasopharyngeal swab, presence of viral mutation(s) within the areas targeted by this assay, and inadequate number of viral copies (<131 copies/mL). A negative result must be combined with clinical observations, patient history, and epidemiological information. The expected result is Negative.  Fact Sheet for Patients:  PinkCheek.be  Fact Sheet for Healthcare Providers:  GravelBags.it  This test is no t yet approved or cleared by the [place] FDA and  has been authorized for detection and/or  diagnosis of SARS-CoV-2 by FDA under an Emergency Use Authorization (EUA). This EUA will remain  in effect (meaning this test can be used) for the duration of the COVID-19 declaration under Section 564(b)(1) of the Act, 21 U.S.C. section 360bbb-3(b)(1), unless the authorization is terminated or revoked sooner.     Influenza A by PCR NEGATIVE NEGATIVE Final   Influenza B by PCR NEGATIVE NEGATIVE Final    Comment: (NOTE) The Xpert Xpress SARS-CoV-2/FLU/RSV assay is intended as an aid in  the diagnosis of influenza from Nasopharyngeal swab specimens and  should not be used as a sole basis  for treatment. Nasal washings and  aspirates are unacceptable for Xpert Xpress SARS-CoV-2/FLU/RSV  testing.  Fact Sheet for Patients: PinkCheek.be  Fact Sheet for Healthcare Providers: GravelBags.it  This test is not yet approved or cleared by the [place] FDA and  has been authorized for detection and/or diagnosis of SARS-CoV-2 by  FDA under an Emergency Use Authorization (EUA). This EUA will remain  in effect (meaning this test can be used) for the duration of the  Covid-19 declaration under Section 564(b)(1) of the Act, 21  U.S.C. section 360bbb-3(b)(1), unless the authorization is  terminated or revoked. Performed at [facility], [address] [place], [place] [address]   Culture, blood (routine x 2)     Status: None (Preliminary result)   Collection Time: 07/22/20  8:49 AM   Specimen: BLOOD  Result Value Ref Range Status   Specimen Description BLOOD LEFT ANTECUBITAL  Final   Special Requests   Final    BOTTLES DRAWN AEROBIC AND ANAEROBIC Blood Culture results may not be optimal due to an inadequate volume of blood received in culture bottles   Culture   Final    NO GROWTH 3 DAYS Performed at [facility], [place] [address] [place], [place] [address]    Report Status PENDING  Incomplete  Culture, blood (routine x 2)      Status: None (Preliminary result)   Collection Time: 07/22/20  8:49 AM   Specimen: BLOOD  Result Value Ref Range Status   Specimen Description BLOOD BLOOD RIGHT HAND  Final   Special Requests   Final    BOTTLES DRAWN AEROBIC AND ANAEROBIC Blood Culture results may not be optimal due to an inadequate volume of blood received in culture bottles   Culture   Final    NO GROWTH 3 DAYS Performed at [facility], [place] [address], [place], [place] [address]    Report Status PENDING  Incomplete  Body fluid culture     Status: None (Preliminary result)   Collection Time: 07/23/20 10:40 AM   Specimen: Body Fluid  Result Value Ref Range Status   Specimen Description FLUID  Final   Special Requests PERITONEAL DIALYSATE  Final   Gram Stain   Final    RARE WBC PRESENT, PREDOMINANTLY MONONUCLEAR NO ORGANISMS SEEN    Culture   Final    NO GROWTH 2 DAYS Performed at [facility], [address], [place], [place] [address]    Report Status PENDING  Incomplete     Labs: BNP (last 3 results) No results for input(s): BNP in the last 8760 hours. Basic Metabolic Panel: Recent Labs  Lab 07/21/20 1357 07/22/20 0419 07/23/20 0123 07/24/20 0338 07/25/20 0430  NA 136 136 140 141 141  K 3.1* 3.7 3.3* 3.6 3.5  CL 99 103 105 105 104  CO2 27 24 24 23 25   GLUCOSE 119* 123* 168* 216* 218*  BUN 42* 44* 41* 46* 42*  CREATININE 3.74* 3.80* 3.94* 3.78* 3.92*  CALCIUM 7.8* 7.5* 7.7* 8.2* 8.3*  PHOS  --   --   --  4.1  --    Liver Function Tests: No results for input(s): AST, ALT, ALKPHOS, BILITOT, PROT, ALBUMIN in the last 168 hours. No results for input(s): LIPASE, AMYLASE in the last 168 hours. No results for input(s): AMMONIA in the last 168 hours. CBC: Recent Labs  Lab 07/21/20 1357 07/21/20 1357 07/22/20 0849 07/22/20 2248 07/23/20 0123 07/24/20 0338 07/25/20 0430  WBC 7.4  --  6.5  --  6.4 4.3 4.9  HGB 7.0*   < > 6.9* 8.3* 9.1* 8.8* 9.0*  HCT 22.6*   < > 21.9* 25.8* 27.8*  28.0* 28.5*  MCV 86.9  --  86.9  --  86.3 87.5 86.9  PLT 294  --  259  --  236 227 220   < > = values in this interval not displayed.   Cardiac Enzymes: No results for input(s): CKTOTAL, CKMB, CKMBINDEX, TROPONINI in the last 168 hours. BNP: Invalid input(s): POCBNP CBG: Recent Labs  Lab 07/24/20 1100 07/24/20 1610 07/24/20 2117 07/25/20 0538 07/25/20 1107  GLUCAP 154* 203* 253* 189* 247*   D-Dimer No results for input(s): DDIMER in the last 72 hours. Hgb A1c No results for input(s): HGBA1C in the last 72 hours. Lipid Profile No results for input(s): CHOL, HDL, LDLCALC, TRIG, CHOLHDL, LDLDIRECT in the last 72 hours. Thyroid function studies No results for input(s): TSH, T4TOTAL, T3FREE, THYROIDAB in the last 72 hours.  Invalid input(s): FREET3 Anemia work up Recent Labs    07/24/20 1219  VITAMINB12 222   Urinalysis    Component Value Date/Time   COLORURINE YELLOW 07/21/2020 [place] 07/21/2020 1546   LABSPEC 1.012 07/21/2020 1546   PHURINE 5.0 07/21/2020 1546   GLUCOSEU NEGATIVE 07/21/2020 1546   HGBUR SMALL (A) 07/21/2020 1546   BILIRUBINUR NEGATIVE 07/21/2020 1546   KETONESUR NEGATIVE 07/21/2020 1546   PROTEINUR 30 (A) 07/21/2020 1546   UROBILINOGEN 0.2 02/24/2015 0853   NITRITE NEGATIVE 07/21/2020 1546   LEUKOCYTESUR SMALL (A) 07/21/2020 1546   Sepsis Labs Invalid input(s): PROCALCITONIN,  WBC,  LACTICIDVEN Microbiology Recent Results (from the past 240 hour(s))  Urine Culture     Status: None   Collection Time: 07/21/20  6:00 PM   Specimen: Urine, Random  Result Value Ref Range Status   Specimen Description   Final    URINE, RANDOM Performed at [facility], [address] [place], [place] [address]    Special Requests   Final    NONE Performed at [facility], [address], [place], [place] [address]    Culture   Final    NO GROWTH Performed at [facility], [place] [address], [place], [place] [address]    Report Status  07/23/2020 FINAL  Final  Respiratory Panel by RT PCR (Flu A&B, Covid) - Nasopharyngeal Swab     Status: None   Collection Time: 07/21/20  8:25 PM   Specimen: Nasopharyngeal Swab  Result Value Ref Range Status   SARS Coronavirus 2 by RT PCR NEGATIVE NEGATIVE Final    Comment: (NOTE) SARS-CoV-2 target nucleic acids are NOT DETECTED.  The SARS-CoV-2 RNA is generally detectable in upper respiratoy specimens during the acute phase of infection. The lowest concentration of SARS-CoV-2 viral copies this assay can detect is 131 copies/mL. A negative result does not preclude SARS-Cov-2 infection and should not be used as the sole basis for treatment or other patient management decisions. A negative result may occur with  improper specimen collection/handling, submission of specimen other than nasopharyngeal swab, presence of viral mutation(s) within the areas targeted by this assay, and inadequate number of viral copies (<131 copies/mL). A negative result must be combined with clinical observations, patient history, and epidemiological information. The expected result is Negative.  Fact Sheet for Patients:  PinkCheek.be  Fact Sheet for Healthcare Providers:  GravelBags.it  This test is no t yet approved or cleared by the [place] and  has been authorized for detection and/or diagnosis of SARS-CoV-2 by FDA under an Emergency Use Authorization (EUA). This EUA will remain  in effect (meaning this test can be used) for the duration of the COVID-19 declaration under Section 564(b)(1) of the Act, 21 U.S.C. section 360bbb-3(b)(1), unless the authorization is terminated or revoked sooner.     Influenza A by PCR NEGATIVE NEGATIVE Final   Influenza B by PCR NEGATIVE NEGATIVE Final    Comment: (NOTE) The Xpert Xpress SARS-CoV-2/FLU/RSV assay is intended as an aid in  the diagnosis of influenza from Nasopharyngeal swab specimens and   should not be used as a sole basis for treatment. Nasal washings and  aspirates are unacceptable for Xpert Xpress SARS-CoV-2/FLU/RSV  testing.  Fact Sheet for Patients: PinkCheek.be  Fact Sheet for Healthcare Providers: GravelBags.it  This test is not yet approved or cleared by the [place] FDA and  has been authorized for detection and/or diagnosis of SARS-CoV-2 by  FDA under an Emergency Use Authorization (EUA). This EUA will remain  in effect (meaning this test can be used) for the duration of the  Covid-19 declaration under Section 564(b)(1) of the Act, 21  U.S.C. section 360bbb-3(b)(1), unless the authorization is  terminated or revoked. Performed at [facility], [address], [place], [place] [address]   Culture, blood (routine x 2)     Status: None (Preliminary result)   Collection Time: 07/22/20  8:49 AM   Specimen: BLOOD  Result Value Ref Range Status   Specimen Description BLOOD LEFT ANTECUBITAL  Final   Special Requests   Final    BOTTLES DRAWN AEROBIC AND ANAEROBIC Blood Culture results may not be optimal due to an inadequate volume of blood received in culture bottles   Culture   Final    NO GROWTH 3 DAYS Performed at [facility], [place] [address], [place], [place] [address]    Report Status PENDING  Incomplete  Culture, blood (routine x 2)     Status: None (Preliminary result)   Collection Time: 07/22/20  8:49 AM   Specimen: BLOOD  Result Value Ref Range Status   Specimen Description BLOOD BLOOD RIGHT HAND  Final   Special Requests   Final    BOTTLES DRAWN AEROBIC AND ANAEROBIC Blood Culture results may not be optimal due to an inadequate volume of blood received in culture bottles   Culture   Final    NO GROWTH 3 DAYS Performed at [facility], [place] [address] [place], [place] [address]    Report Status PENDING  Incomplete  Body fluid culture     Status: None (Preliminary result)    Collection Time: 07/23/20 10:40 AM   Specimen: Body Fluid  Result Value Ref Range Status   Specimen Description FLUID  Final   Special Requests PERITONEAL DIALYSATE  Final   Gram Stain   Final    RARE WBC PRESENT, PREDOMINANTLY MONONUCLEAR NO ORGANISMS SEEN    Culture   Final    NO GROWTH 2 DAYS Performed at [facility], [address] [place], [place] [address]    Report Status PENDING  Incomplete     Patient was seen and examined on the day of discharge and was found to be in stable condition. Time coordinating discharge: 25 minutes including assessment and coordination of care, as well as examination of the patient.   SIGNED:  Dessa Phi, DO [facility] 07/25/2020, 11:45 AM

## 2020-07-26 LAB — PATHOLOGIST SMEAR REVIEW

## 2020-07-27 LAB — CULTURE, BLOOD (ROUTINE X 2)
Culture: NO GROWTH
Culture: NO GROWTH

## 2020-07-27 LAB — BODY FLUID CULTURE: Culture: NO GROWTH

## 2020-08-20 ENCOUNTER — Ambulatory Visit
Admission: EM | Admit: 2020-08-20 | Discharge: 2020-08-20 | Disposition: A | Discharge status: Home / Self Care | Payer: Medicare PPO | Attending: Emergency Medicine | Admitting: Emergency Medicine

## 2020-08-20 ENCOUNTER — Other Ambulatory Visit: Payer: Self-pay

## 2020-08-20 DIAGNOSIS — B009 Herpesviral infection, unspecified: Secondary | ICD-10-CM | POA: Diagnosis not present

## 2020-08-20 DIAGNOSIS — H05011 Cellulitis of right orbit: Secondary | ICD-10-CM

## 2020-08-20 MED ORDER — VALACYCLOVIR HCL 500 MG PO TABS: 500.0000 mg | ORAL_TABLET | Freq: Every day | ORAL | 0 refills | Status: AC

## 2020-08-20 MED ORDER — SULFAMETHOXAZOLE-TRIMETHOPRIM 800-160 MG PO TABS: 1.0000 | ORAL_TABLET | Freq: Every day | ORAL | 0 refills | Status: AC

## 2020-08-20 NOTE — ED Triage Notes (Signed)
Pt presents with right eye swelling, states she thinks its from blowing nose frequently

## 2020-08-20 NOTE — Discharge Instructions (Addendum)
Valacyclovir as prescribed for cold sore Bactrim DS was prescribed for possible orbital cellulitis Follow with PCP Return or go to ED if you develop any new or worsening of your symptoms

## 2020-08-20 NOTE — ED Provider Notes (Signed)
[place]   [id number] 08/20/20 Arrival Time: 1223   Chief Complaint  Patient presents with  . Facial Swelling     SUBJECTIVE: History from: patient and family.  KITZIA CAMUS is a 79 y.o. female who who presented to the urgent care with a complaint of right eye swelling and cold sore for the past few days.  Denies any precipitating event.  Denies recent travel.  Has tried OTC medication without relief.  Denies aggravating factors.  Denies Previous symptoms in the past.   Denies fever, chills, fatigue, sinus pain, rhinorrhea, sore throat, SOB, wheezing, chest pain, nausea, changes in bowel or bladder habits.    ROS: As per HPI.  All other pertinent ROS negative.      Past Medical History:  Diagnosis Date  . Arthritis   . Diabetes mellitus   . GERD (gastroesophageal reflux disease)   . HLD (hyperlipidemia)   . Hypercholesterolemia   . Hypertension   . Kidney stones   . Osteoporosis   . Proteinuria    Past Surgical History:  Procedure Laterality Date  . ABDOMINAL HYSTERECTOMY    . BACK SURGERY     lumbar  . BREAST CYST ASPIRATION     unsure of laterality   . CAPD INSERTION N/A 02/13/2018   Procedure: LAPAROSCOPIC INSERTION CONTINUOUS AMBULATORY PERITONEAL DIALYSIS  (CAPD) CATHETER;  Surgeon: Algernon Huxley, MD;  Location: [facility] ORS;  Service: Vascular;  Laterality: N/A;  . CHOLECYSTECTOMY    . EYE SURGERY     bilateral cataract   . NASAL SEPTUM SURGERY     Allergies  Allergen Reactions  . Penicillins Anaphylaxis   No current facility-administered medications on file prior to encounter.   Current Outpatient Medications on File Prior to Encounter  Medication Sig Dispense Refill  . ACCU-CHEK AVIVA PLUS test strip TESTING TWICE DAILY. 100 each 11  . Acetaminophen (TYLENOL) 325 MG CAPS Take 1 capsule by mouth daily as needed.    Marland Kitchen atorvastatin (LIPITOR) 20 MG tablet Take 1 tablet (20 mg total) by mouth daily. (Patient taking differently: Take 10 mg by mouth  daily. ) 30 tablet 1  . benazepril (LOTENSIN) 20 MG tablet Take by mouth.    . diclofenac Sodium (VOLTAREN) 1 % GEL Apply 2 g topically daily as needed.     . Ferrous Sulfate 134 MG TABS Take by mouth.    . hydrALAZINE (APRESOLINE) 100 MG tablet Take 1 tablet (100 mg total) by mouth every 8 (eight) hours. (Patient taking differently: Take 50 mg by mouth every 8 (eight) hours. ) 90 tablet 1  . Insulin Glargine (LANTUS SOLOSTAR) 100 UNIT/ML Solostar Pen Inject 20 Units into the skin at bedtime.     . Insulin Pen Needle 31G X 5 MM MISC As directed 100 each 5  . omeprazole (PRILOSEC) 40 MG capsule Take 40 mg by mouth daily.    . potassium chloride (KLOR-CON) 10 MEQ tablet Take 10 mEq by mouth daily.    Marland Kitchen senna (SENOKOT) 8.6 MG tablet Take 2 tablets by mouth daily.    Marland Kitchen spironolactone (ALDACTONE) 25 MG tablet Take 1 tablet (25 mg total) by mouth daily. 30 tablet 1  . torsemide (DEMADEX) 20 MG tablet Take 20 mg by mouth daily as needed.      Social History   Socioeconomic History  . Marital status: Married    Spouse name: Not on file  . Number of children: Not on file  . Years of education: Not on  file  . Highest education level: Not on file  Occupational History  . Not on file  Tobacco Use  . Smoking status: Never Smoker  . Smokeless tobacco: Never Used  Vaping Use  . Vaping Use: Never used  Substance and Sexual Activity  . Alcohol use: No    Alcohol/week: 0.0 standard drinks  . Drug use: No  . Sexual activity: Yes  Other Topics Concern  . Not on file  Social History Narrative  . Not on file   Social Determinants of Health   Financial Resource Strain: Not on file  Food Insecurity: Not on file  Transportation Needs: Not on file  Physical Activity: Not on file  Stress: Not on file  Social Connections: Not on file  Intimate Partner Violence: Not on file   Family History  Problem Relation Age of Onset  . Other Mother   . Heart attack Father   . Breast cancer Paternal Aunt      OBJECTIVE:  Vitals:   08/20/20 1248  BP: (!) 159/66  Pulse: 68  Resp: 18  Temp: 98 F (36.7 C)  TempSrc: Oral     Physical Exam Vitals and nursing note reviewed.  Constitutional:      General: She is not in acute distress.    Appearance: Normal appearance. She is normal weight. She is not ill-appearing, toxic-appearing or diaphoretic.  HENT:     Head: Normocephalic.     Mouth/Throat:     Lips: Pink.     Comments: Oral vesicular present on the upper lips Eyes:     General: Lids are normal. Lids are everted, no foreign bodies appreciated. Vision grossly intact. Gaze aligned appropriately. No visual field deficit.       Right eye: No foreign body, discharge or hordeolum.     Comments: Swelling of upper eyelid with erythema surrounding  Cardiovascular:     Rate and Rhythm: Normal rate and regular rhythm.     Pulses: Normal pulses.     Heart sounds: Normal heart sounds. No murmur heard. No friction rub. No gallop.   Pulmonary:     Effort: Pulmonary effort is normal. No respiratory distress.     Breath sounds: Normal breath sounds. No stridor. No wheezing, rhonchi or rales.  Chest:     Chest wall: No tenderness.  Neurological:     Mental Status: She is alert and oriented to person, place, and time.      LABS:  No results found for this or any previous visit (from the past 24 hour(s)).   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 Cellulitis of right orbital region     Meds ordered this encounter  Medications  . valACYclovir (VALTREX) 500 MG tablet    Sig: Take 1 tablet (500 mg total) by mouth daily for 3 days.    Dispense:  3 tablet    Refill:  0  . sulfamethoxazole-trimethoprim (BACTRIM DS) 800-160 MG tablet    Sig: Take 1 tablet by mouth daily for 7 days.    Dispense:  7 tablet    Refill:  0   Patient stable at discharge.  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  Medication was prescribed  accordingly.   Discharge instructions  Valacyclovir as prescribed for cold sore Bactrim DS was prescribed for possible orbital cellulitis Follow with PCP Return or go to ED if you develop any new or worsening of your symptoms  Reviewed expectations re:  course of current medical issues. Questions answered. Outlined signs and symptoms indicating need for more acute intervention. Patient verbalized understanding. After Visit Summary given.         Emerson Monte, FNP 08/20/20 1402

## 2020-09-08 ENCOUNTER — Encounter (HOSPITAL_COMMUNITY): Payer: Self-pay | Admitting: Emergency Medicine

## 2020-09-08 ENCOUNTER — Other Ambulatory Visit: Payer: Self-pay

## 2020-09-08 ENCOUNTER — Emergency Department (HOSPITAL_COMMUNITY): Payer: Medicare PPO

## 2020-09-08 ENCOUNTER — Observation Stay (HOSPITAL_COMMUNITY)
Admission: EM | Admit: 2020-09-08 | Discharge: 2020-09-09 | Disposition: A | Discharge status: Home / Self Care | Payer: Medicare PPO | Attending: Internal Medicine | Admitting: Internal Medicine

## 2020-09-08 DIAGNOSIS — N186 End stage renal disease: Secondary | ICD-10-CM | POA: Diagnosis present

## 2020-09-08 DIAGNOSIS — D649 Anemia, unspecified: Secondary | ICD-10-CM | POA: Diagnosis not present

## 2020-09-08 DIAGNOSIS — Z794 Long term (current) use of insulin: Secondary | ICD-10-CM | POA: Insufficient documentation

## 2020-09-08 DIAGNOSIS — Z992 Dependence on renal dialysis: Secondary | ICD-10-CM

## 2020-09-08 DIAGNOSIS — E785 Hyperlipidemia, unspecified: Secondary | ICD-10-CM | POA: Diagnosis present

## 2020-09-08 DIAGNOSIS — I120 Hypertensive chronic kidney disease with stage 5 chronic kidney disease or end stage renal disease: Secondary | ICD-10-CM | POA: Insufficient documentation

## 2020-09-08 DIAGNOSIS — E119 Type 2 diabetes mellitus without complications: Secondary | ICD-10-CM

## 2020-09-08 DIAGNOSIS — Z20822 Contact with and (suspected) exposure to covid-19: Secondary | ICD-10-CM | POA: Diagnosis not present

## 2020-09-08 DIAGNOSIS — Z79899 Other long term (current) drug therapy: Secondary | ICD-10-CM | POA: Diagnosis not present

## 2020-09-08 DIAGNOSIS — R531 Weakness: Secondary | ICD-10-CM

## 2020-09-08 DIAGNOSIS — E1122 Type 2 diabetes mellitus with diabetic chronic kidney disease: Secondary | ICD-10-CM | POA: Diagnosis not present

## 2020-09-08 DIAGNOSIS — R0602 Shortness of breath: Secondary | ICD-10-CM

## 2020-09-08 DIAGNOSIS — I10 Essential (primary) hypertension: Secondary | ICD-10-CM | POA: Diagnosis present

## 2020-09-08 LAB — CBC
HCT: 22.5 % — ABNORMAL LOW (ref 36.0–46.0)
Hemoglobin: 6.8 g/dL — CL (ref 12.0–15.0)
MCH: 26.5 pg (ref 26.0–34.0)
MCHC: 30.2 g/dL (ref 30.0–36.0)
MCV: 87.5 fL (ref 80.0–100.0)
Platelets: 282 10*3/uL (ref 150–400)
RBC: 2.57 MIL/uL — ABNORMAL LOW (ref 3.87–5.11)
RDW: 17.6 % — ABNORMAL HIGH (ref 11.5–15.5)
WBC: 6.5 10*3/uL (ref 4.0–10.5)
nRBC: 0 % (ref 0.0–0.2)

## 2020-09-08 LAB — POC OCCULT BLOOD, ED: Fecal Occult Bld: NEGATIVE

## 2020-09-08 LAB — COMPREHENSIVE METABOLIC PANEL
ALT: 14 U/L (ref 0–44)
AST: 18 U/L (ref 15–41)
Albumin: 2 g/dL — ABNORMAL LOW (ref 3.5–5.0)
Alkaline Phosphatase: 65 U/L (ref 38–126)
Anion gap: 15 (ref 5–15)
BUN: 59 mg/dL — ABNORMAL HIGH (ref 8–23)
CO2: 21 mmol/L — ABNORMAL LOW (ref 22–32)
Calcium: 7.2 mg/dL — ABNORMAL LOW (ref 8.9–10.3)
Chloride: 99 mmol/L (ref 98–111)
Creatinine, Ser: 5.91 mg/dL — ABNORMAL HIGH (ref 0.44–1.00)
GFR, Estimated: 7 mL/min — ABNORMAL LOW (ref >60)
Glucose, Bld: 157 mg/dL — ABNORMAL HIGH (ref 70–99)
Potassium: 3.4 mmol/L — ABNORMAL LOW (ref 3.5–5.1)
Sodium: 135 mmol/L (ref 135–145)
Total Bilirubin: 0.4 mg/dL (ref 0.3–1.2)
Total Protein: 6.4 g/dL — ABNORMAL LOW (ref 6.5–8.1)

## 2020-09-08 LAB — RETICULOCYTES
Immature Retic Fract: 15.3 % (ref 2.3–15.9)
RBC.: 2.57 MIL/uL — ABNORMAL LOW (ref 3.87–5.11)
Retic Count, Absolute: 14.1 10*3/uL — ABNORMAL LOW (ref 19.0–186.0)
Retic Ct Pct: 0.6 % (ref 0.4–3.1)

## 2020-09-08 LAB — RESP PANEL BY RT-PCR (FLU A&B, COVID) ARPGX2
Influenza A by PCR: NEGATIVE
Influenza B by PCR: NEGATIVE
SARS Coronavirus 2 by RT PCR: NEGATIVE

## 2020-09-08 LAB — TROPONIN I (HIGH SENSITIVITY): Troponin I (High Sensitivity): 38 ng/L — ABNORMAL HIGH (ref <18)

## 2020-09-08 MED ORDER — SODIUM CHLORIDE 0.9 % IV SOLN
10.0000 mL/h | Freq: Once | INTRAVENOUS | Status: AC
  Administered 2020-09-09: 10 mL/h via INTRAVENOUS

## 2020-09-08 NOTE — ED Provider Notes (Signed)
[facility] EMERGENCY DEPARTMENT Provider Note   CSN: [id number] Arrival date & time: 09/08/20  2104     History Chief Complaint  Patient presents with  . Shortness of Breath    Catherine Burke is a 80 y.o. female.  Patient with hx esrd on peritoneal dialysis, presents with chest tightness and sob for the past several months. Indicates has mentioned it to her doctors, but no specific dx/cause. Symptoms gradual onset, constant, persistent, non- radiating, slowly worse, worse w exertion. States compliant w her peritoneal dialysis, denies complications. Unaware of wt increase, or decrease. No increased leg edema. Chest tightness, constant, persistent, worse w exertion, not pleuritic. Occasional non prod cough. No sore throat. No fevers, t in ED 99.7. no known specific ill contacts w covid/flu. Denies hx cad. No hx asthma/copd. Non smoker. No hx dvt or pe.   The history is provided by the patient.  Shortness of Breath Associated symptoms: chest pain   Associated symptoms: no abdominal pain, no fever, no headaches, no neck pain, no rash, no sore throat and no vomiting        Past Medical History:  Diagnosis Date  . Arthritis   . Diabetes mellitus   . GERD (gastroesophageal reflux disease)   . HLD (hyperlipidemia)   . Hypercholesterolemia   . Hypertension   . Kidney stones   . Osteoporosis   . Proteinuria     Patient Active Problem List   Diagnosis Date Noted  . Symptomatic anemia 07/22/2020  . Acute on chronic anemia 07/21/2020  . ESRD (end stage renal disease) ([place]) 07/21/2020  . Peritoneal dialysis status ([place]) 07/21/2020  . Chronic kidney disease (CKD), stage IV (severe) ([place]) 02/08/2018  . Proteinuria 10/10/2017  . Chronic kidney disease (CKD), stage III (moderate) ([place]) 02/24/2015  . Kidney stones   . Osteoporosis   . GERD (gastroesophageal reflux disease)   . Diabetes mellitus ([place]) 01/17/2007  . Hyperlipidemia 01/17/2007  . Essential hypertension 01/17/2007  .  OSTEOPENIA 01/17/2007  . HYSTERECTOMY, PARTIAL, HX OF 01/17/2007    Past Surgical History:  Procedure Laterality Date  . ABDOMINAL HYSTERECTOMY    . BACK SURGERY     lumbar  . BREAST CYST ASPIRATION     unsure of laterality   . CAPD INSERTION N/A 02/13/2018   Procedure: LAPAROSCOPIC INSERTION CONTINUOUS AMBULATORY PERITONEAL DIALYSIS  (CAPD) CATHETER;  Surgeon: Algernon Huxley, MD;  Location: [facility] ORS;  Service: Vascular;  Laterality: N/A;  . CHOLECYSTECTOMY    . EYE SURGERY     bilateral cataract   . NASAL SEPTUM SURGERY       OB History   No obstetric history on file.     Family History  Problem Relation Age of Onset  . Other Mother   . Heart attack Father   . Breast cancer Paternal Aunt     Social History   Tobacco Use  . Smoking status: Never Smoker  . Smokeless tobacco: Never Used  Vaping Use  . Vaping Use: Never used  Substance Use Topics  . Alcohol use: No    Alcohol/week: 0.0 standard drinks  . Drug use: No    Home Medications Prior to Admission medications   Medication Sig Start Date End Date Taking? Authorizing Provider  ACCU-CHEK AVIVA PLUS test strip TESTING TWICE DAILY. 08/11/14   Susy Frizzle, MD  Acetaminophen (TYLENOL) 325 MG CAPS Take 1 capsule by mouth daily as needed.    [provider]  atorvastatin (LIPITOR) 20 MG tablet  Take 1 tablet (20 mg total) by mouth daily. Patient taking differently: Take 10 mg by mouth daily.  06/03/15   Alycia Rossetti, MD  benazepril (LOTENSIN) 20 MG tablet Take by mouth. 08/06/18   [provider]  diclofenac Sodium (VOLTAREN) 1 % GEL Apply 2 g topically daily as needed.     [provider]  Ferrous Sulfate 134 MG TABS Take by mouth.    [provider]  hydrALAZINE (APRESOLINE) 100 MG tablet Take 1 tablet (100 mg total) by mouth every 8 (eight) hours. Patient taking differently: Take 50 mg by mouth every 8 (eight) hours.  10/13/17 07/21/20  Henreitta Leber, MD  Insulin Glargine  (LANTUS SOLOSTAR) 100 UNIT/ML Solostar Pen Inject 20 Units into the skin at bedtime.  12/11/18   [provider]  Insulin Pen Needle 31G X 5 MM MISC As directed 03/31/15   Alycia Rossetti, MD  omeprazole (PRILOSEC) 40 MG capsule Take 40 mg by mouth daily.    [provider]  potassium chloride (KLOR-CON) 10 MEQ tablet Take 10 mEq by mouth daily. 03/11/20   [provider]  senna (SENOKOT) 8.6 MG tablet Take 2 tablets by mouth daily.    [provider]  spironolactone (ALDACTONE) 25 MG tablet Take 1 tablet (25 mg total) by mouth daily. 10/14/17 07/21/20  Henreitta Leber, MD  torsemide (DEMADEX) 20 MG tablet Take 20 mg by mouth daily as needed.     [provider]    Allergies    Penicillins and Sulfa antibiotics  Review of Systems   Review of Systems  Constitutional: Negative for chills and fever.  HENT: Negative for sore throat.   Eyes: Negative for redness.  Respiratory: Positive for shortness of breath.   Cardiovascular: Positive for chest pain. Negative for palpitations and leg swelling.  Gastrointestinal: Negative for abdominal pain and vomiting.  Genitourinary: Negative for flank pain.  Musculoskeletal: Negative for back pain and neck pain.  Skin: Negative for rash.  Neurological: Negative for headaches.  Hematological: Does not bruise/bleed easily.  Psychiatric/Behavioral: Negative for confusion.    Physical Exam Updated Vital Signs BP (!) 148/7   Pulse 87   Temp 99.7 F (37.6 C) (Oral)   Resp 17   Ht 1.626 m (5\' 4" )   Wt 57.6 kg   SpO2 96%   BMI 21.80 kg/m   Physical Exam Vitals and nursing note reviewed.  Constitutional:      Appearance: Normal appearance. She is well-developed.  HENT:     Head: Atraumatic.     Nose: Nose normal.     Mouth/Throat:     Mouth: Mucous membranes are moist.  Eyes:     General: No scleral icterus.    Conjunctiva/sclera: Conjunctivae normal.  Neck:     Trachea: No tracheal deviation.   Cardiovascular:     Rate and Rhythm: Normal rate and regular rhythm.     Pulses: Normal pulses.     Heart sounds: Normal heart sounds. No murmur heard. No friction rub. No gallop.   Pulmonary:     Effort: Pulmonary effort is normal. No respiratory distress.     Breath sounds: Normal breath sounds.  Abdominal:     General: Bowel sounds are normal. There is no distension.     Palpations: Abdomen is soft.     Tenderness: There is no abdominal tenderness. There is no guarding.     Comments: capd cath site without sign of infection.   Genitourinary:  Comments: No cva tenderness. Firm stool, light yellowish-brown, sent for hemoccult, but grossly neg.  Musculoskeletal:        General: No swelling or tenderness.     Cervical back: Normal range of motion and neck supple. No rigidity. No muscular tenderness.     Right lower leg: No edema.     Left lower leg: No edema.  Skin:    General: Skin is warm and dry.     Findings: No rash.  Neurological:     Mental Status: She is alert.     Comments: Alert, speech normal.   Psychiatric:        Mood and Affect: Mood normal.      ED Results / Procedures / Treatments   Labs (all labs ordered are listed, but only abnormal results are displayed) Results for orders placed or performed during the hospital encounter of 07/21/20  Urine Culture   Specimen: Urine, Random  Result Value Ref Range   Specimen Description      URINE, RANDOM Performed at [facility], [address], [place], [place] [address]    Special Requests      NONE Performed at [facility], [address], [place], [place] [address]    Culture      NO GROWTH Performed at [facility], [place] [address] [place], [place] [address]    Report Status 07/23/2020 FINAL   Respiratory Panel by RT PCR (Flu A&B, Covid) - Nasopharyngeal Swab   Specimen: Nasopharyngeal Swab  Result Value Ref Range   SARS Coronavirus 2 by RT PCR NEGATIVE NEGATIVE   Influenza A by PCR NEGATIVE  NEGATIVE   Influenza B by PCR NEGATIVE NEGATIVE  Culture, blood (routine x 2)   Specimen: BLOOD  Result Value Ref Range   Specimen Description BLOOD LEFT ANTECUBITAL    Special Requests      BOTTLES DRAWN AEROBIC AND ANAEROBIC Blood Culture results may not be optimal due to an inadequate volume of blood received in culture bottles   Culture      NO GROWTH 5 DAYS Performed at [place] [address], [place], [place] [address]    Report Status 07/27/2020 FINAL   Culture, blood (routine x 2)   Specimen: BLOOD  Result Value Ref Range   Specimen Description BLOOD BLOOD RIGHT HAND    Special Requests      BOTTLES DRAWN AEROBIC AND ANAEROBIC Blood Culture results may not be optimal due to an inadequate volume of blood received in culture bottles   Culture      NO GROWTH 5 DAYS Performed at [place] [address] [place], [place] [address]    Report Status 07/27/2020 FINAL   Body fluid culture   Specimen: Body Fluid  Result Value Ref Range   Specimen Description FLUID    Special Requests PERITONEAL DIALYSATE    Gram Stain      RARE WBC PRESENT, PREDOMINANTLY MONONUCLEAR NO ORGANISMS SEEN    Culture      NO GROWTH 3 DAYS Performed at [facility], [address], [place],  [address]    Report Status 07/27/2020 FINAL   Basic metabolic panel  Result Value Ref Range   Sodium 136 135 - 145 mmol/L   Potassium 3.1 (L) 3.5 - 5.1 mmol/L   Chloride 99 98 - 111 mmol/L   CO2 27 22 - 32 mmol/L   Glucose, Bld 119 (H) 70 - 99 mg/dL  BUN 42 (H) 8 - 23 mg/dL   Creatinine, Ser 3.74 (H) 0.44 - 1.00 mg/dL   Calcium 7.8 (L) 8.9 - 10.3 mg/dL   GFR, Estimated 12 (L) >60 mL/min   Anion gap 10 5 - 15  CBC  Result Value Ref Range   WBC 7.4 4.0 - 10.5 K/uL   RBC 2.60 (L) 3.87 - 5.11 MIL/uL   Hemoglobin 7.0 (L) 12.0 - 15.0 g/dL   HCT 22.6 (L) 36.0 - 46.0 %   MCV 86.9 80.0 - 100.0 fL   MCH 26.9 26.0 - 34.0 pg   MCHC 31.0 30.0 - 36.0 g/dL   RDW 15.7 (H)  11.5 - 15.5 %   Platelets 294 150 - 400 K/uL   nRBC 0.0 0.0 - 0.2 %  Urinalysis, Routine w reflex microscopic  Result Value Ref Range   Color, Urine YELLOW YELLOW   APPearance CLEAR CLEAR   Specific Gravity, Urine 1.012 1.005 - 1.030   pH 5.0 5.0 - 8.0   Glucose, UA NEGATIVE NEGATIVE mg/dL   Hgb urine dipstick SMALL (A) NEGATIVE   Bilirubin Urine NEGATIVE NEGATIVE   Ketones, ur NEGATIVE NEGATIVE mg/dL   Protein, ur 30 (A) NEGATIVE mg/dL   Nitrite NEGATIVE NEGATIVE   Leukocytes,Ua SMALL (A) NEGATIVE   RBC / HPF 11-20 0 - 5 RBC/hpf   WBC, UA 21-50 0 - 5 WBC/hpf   Bacteria, UA RARE (A) NONE SEEN   Squamous Epithelial / LPF 0-5 0 - 5  Vitamin B12  Result Value Ref Range   Vitamin B-12 228 180 - 914 pg/mL  Folate  Result Value Ref Range   Folate 9.3 >5.9 ng/mL  Iron and TIBC  Result Value Ref Range   Iron 21 (L) 28 - 170 ug/dL   TIBC 226 (L) 250 - 450 ug/dL   Saturation Ratios 9 (L) 10.4 - 31.8 %   UIBC 205 ug/dL  Ferritin  Result Value Ref Range   Ferritin 697 (H) 11 - 307 ng/mL  Reticulocytes  Result Value Ref Range   Retic Ct Pct 1.0 0.4 - 3.1 %   RBC. 2.61 (L) 3.87 - 5.11 MIL/uL   Retic Count, Absolute 27.1 19.0 - 186.0 K/uL   Immature Retic Fract 9.0 2.3 - 28.7 %  Basic metabolic panel  Result Value Ref Range   Sodium 136 135 - 145 mmol/L   Potassium 3.7 3.5 - 5.1 mmol/L   Chloride 103 98 - 111 mmol/L   CO2 24 22 - 32 mmol/L   Glucose, Bld 123 (H) 70 - 99 mg/dL   BUN 44 (H) 8 - 23 mg/dL   Creatinine, Ser 3.80 (H) 0.44 - 1.00 mg/dL   Calcium 7.5 (L) 8.9 - 10.3 mg/dL   GFR, Estimated 11 (L) >60 mL/min   Anion gap 9 5 - 15  Glucose, capillary  Result Value Ref Range   Glucose-Capillary 117 (H) 70 - 99 mg/dL   Comment 1 Notify RN    Comment 2 Document in Chart   CBC  Result Value Ref Range   WBC 6.5 4.0 - 10.5 K/uL   RBC 2.52 (L) 3.87 - 5.11 MIL/uL   Hemoglobin 6.9 (LL) 12.0 - 15.0 g/dL   HCT 21.9 (L) 36.0 - 46.0 %   MCV 86.9 80.0 - 100.0 fL   MCH 27.4  26.0 - 34.0 pg   MCHC 31.5 30.0 - 36.0 g/dL   RDW 15.2 11.5 - 15.5 %   Platelets 259 150 - 400 K/uL  nRBC 0.0 0.0 - 0.2 %  Glucose, capillary  Result Value Ref Range   Glucose-Capillary 118 (H) 70 - 99 mg/dL  Hemoglobin A1c  Result Value Ref Range   Hgb A1c MFr Bld 6.0 (H) 4.8 - 5.6 %   Mean Plasma Glucose 125.5 mg/dL  Body fluid cell count with differential  Result Value Ref Range   Fluid Type-FCT PERITONEAL DIALYSATE    Color, Fluid COLORLESS    Appearance, Fluid CLEAR (A) CLEAR   Other Cells, Fluid TOO FEW TO COUNT, SMEAR AVAILABLE FOR REVIEW %  Glucose, capillary  Result Value Ref Range   Glucose-Capillary 146 (H) 70 - 99 mg/dL  CBC  Result Value Ref Range   WBC 6.4 4.0 - 10.5 K/uL   RBC 3.22 (L) 3.87 - 5.11 MIL/uL   Hemoglobin 9.1 (L) 12.0 - 15.0 g/dL   HCT 27.8 (L) 36.0 - 46.0 %   MCV 86.3 80.0 - 100.0 fL   MCH 28.3 26.0 - 34.0 pg   MCHC 32.7 30.0 - 36.0 g/dL   RDW 14.9 11.5 - 15.5 %   Platelets 236 150 - 400 K/uL   nRBC 0.0 0.0 - 0.2 %  Basic metabolic panel  Result Value Ref Range   Sodium 140 135 - 145 mmol/L   Potassium 3.3 (L) 3.5 - 5.1 mmol/L   Chloride 105 98 - 111 mmol/L   CO2 24 22 - 32 mmol/L   Glucose, Bld 168 (H) 70 - 99 mg/dL   BUN 41 (H) 8 - 23 mg/dL   Creatinine, Ser 3.94 (H) 0.44 - 1.00 mg/dL   Calcium 7.7 (L) 8.9 - 10.3 mg/dL   GFR, Estimated 11 (L) >60 mL/min   Anion gap 11 5 - 15  Hemoglobin and hematocrit, blood  Result Value Ref Range   Hemoglobin 8.3 (L) 12.0 - 15.0 g/dL   HCT 25.8 (L) 36.0 - 46.0 %  Glucose, capillary  Result Value Ref Range   Glucose-Capillary 373 (H) 70 - 99 mg/dL   Comment 1 Notify RN    Comment 2 Document in Chart   Glucose, capillary  Result Value Ref Range   Glucose-Capillary 146 (H) 70 - 99 mg/dL   Comment 1 Notify RN    Comment 2 Document in Chart   Glucose, capillary  Result Value Ref Range   Glucose-Capillary 182 (H) 70 - 99 mg/dL  Glucose, capillary  Result Value Ref Range   Glucose-Capillary 194  (H) 70 - 99 mg/dL  Glucose, capillary  Result Value Ref Range   Glucose-Capillary 141 (H) 70 - 99 mg/dL  Pathologist smear review  Result Value Ref Range   Path Review  RMESO   Glucose, capillary  Result Value Ref Range   Glucose-Capillary 140 (H) 70 - 99 mg/dL  CBC  Result Value Ref Range   WBC 4.3 4.0 - 10.5 K/uL   RBC 3.20 (L) 3.87 - 5.11 MIL/uL   Hemoglobin 8.8 (L) 12.0 - 15.0 g/dL   HCT 28.0 (L) 36.0 - 46.0 %   MCV 87.5 80.0 - 100.0 fL   MCH 27.5 26.0 - 34.0 pg   MCHC 31.4 30.0 - 36.0 g/dL   RDW 15.0 11.5 - 15.5 %   Platelets 227 150 - 400 K/uL   nRBC 0.0 0.0 - 0.2 %  Basic metabolic panel  Result Value Ref Range   Sodium 141 135 - 145 mmol/L   Potassium 3.6 3.5 - 5.1 mmol/L   Chloride 105 98 - 111 mmol/L  CO2 23 22 - 32 mmol/L   Glucose, Bld 216 (H) 70 - 99 mg/dL   BUN 46 (H) 8 - 23 mg/dL   Creatinine, Ser 3.78 (H) 0.44 - 1.00 mg/dL   Calcium 8.2 (L) 8.9 - 10.3 mg/dL   GFR, Estimated 12 (L) >60 mL/min   Anion gap 13 5 - 15  Phosphorus  Result Value Ref Range   Phosphorus 4.1 2.5 - 4.6 mg/dL  Glucose, capillary  Result Value Ref Range   Glucose-Capillary 255 (H) 70 - 99 mg/dL   Comment 1 Notify RN    Comment 2 Document in Chart   Glucose, capillary  Result Value Ref Range   Glucose-Capillary 230 (H) 70 - 99 mg/dL   Comment 1 Notify RN    Comment 2 Document in Chart   Glucose, capillary  Result Value Ref Range   Glucose-Capillary 154 (H) 70 - 99 mg/dL   Comment 1 Notify RN    Comment 2 Document in Chart   Vitamin B12  Result Value Ref Range   Vitamin B-12 222 180 - 914 pg/mL  Glucose, capillary  Result Value Ref Range   Glucose-Capillary 203 (H) 70 - 99 mg/dL   Comment 1 Notify RN    Comment 2 Document in Chart   CBC  Result Value Ref Range   WBC 4.9 4.0 - 10.5 K/uL   RBC 3.28 (L) 3.87 - 5.11 MIL/uL   Hemoglobin 9.0 (L) 12.0 - 15.0 g/dL   HCT 28.5 (L) 36.0 - 46.0 %   MCV 86.9 80.0 - 100.0 fL   MCH 27.4 26.0 - 34.0 pg   MCHC 31.6 30.0 - 36.0  g/dL   RDW 14.7 11.5 - 15.5 %   Platelets 220 150 - 400 K/uL   nRBC 0.0 0.0 - 0.2 %  Basic metabolic panel  Result Value Ref Range   Sodium 141 135 - 145 mmol/L   Potassium 3.5 3.5 - 5.1 mmol/L   Chloride 104 98 - 111 mmol/L   CO2 25 22 - 32 mmol/L   Glucose, Bld 218 (H) 70 - 99 mg/dL   BUN 42 (H) 8 - 23 mg/dL   Creatinine, Ser 3.92 (H) 0.44 - 1.00 mg/dL   Calcium 8.3 (L) 8.9 - 10.3 mg/dL   GFR, Estimated 11 (L) >60 mL/min   Anion gap 12 5 - 15  Hepatitis B surface antigen  Result Value Ref Range   Hepatitis B Surface Ag NON REACTIVE NON REACTIVE  Glucose, capillary  Result Value Ref Range   Glucose-Capillary 253 (H) 70 - 99 mg/dL  Glucose, capillary  Result Value Ref Range   Glucose-Capillary 189 (H) 70 - 99 mg/dL  Glucose, capillary  Result Value Ref Range   Glucose-Capillary 247 (H) 70 - 99 mg/dL   Comment 1 Notify RN    Comment 2 Document in Chart   CBG monitoring, ED  Result Value Ref Range   Glucose-Capillary 128 (H) 70 - 99 mg/dL  CBG monitoring, ED  Result Value Ref Range   Glucose-Capillary 79 70 - 99 mg/dL  POC occult blood, ED Provider will collect  Result Value Ref Range   Fecal Occult Bld NEGATIVE NEGATIVE  Type and screen  Result Value Ref Range   ABO/RH(D) A POS    Antibody Screen NEG    Sample Expiration 07/24/2020,2359    Unit Number [id number]    Blood Component Type RED CELLS,LR    Unit division 00    Status of Unit ISSUED,FINAL  Transfusion Status OK TO TRANSFUSE    Crossmatch Result      Compatible Performed at [facility], [address], [place], [place] [address]   Prepare RBC (crossmatch)  Result Value Ref Range   Order Confirmation      ORDER PROCESSED BY BLOOD BANK Performed at [facility], [address] [place], [place] [address]   Type and screen [place]  Result Value Ref Range   ABO/RH(D) A POS    Antibody Screen NEG    Sample Expiration 07/25/2020,2359    Unit Number [id number]    Blood  Component Type RED CELLS,LR    Unit division 00    Status of Unit ISSUED,FINAL    Transfusion Status OK TO TRANSFUSE    Crossmatch Result      Compatible Performed at [facility], [address] [place], [place] [address]   Prepare RBC (crossmatch)  Result Value Ref Range   Order Confirmation      ORDER PROCESSED BY BLOOD BANK Performed at [facility], [place] [address] [place], [place] [address]   BPAM [facility]  Result Value Ref Range   ISSUE DATE / TIME 509326712458    Blood Product Unit Number [id number]    PRODUCT CODE [id number]    Unit Type and Rh 7902    Blood Product Expiration Date 409735329924   BPAM RBC  Result Value Ref Range   ISSUE DATE / TIME [id number]    Blood Product Unit Number [id number]    PRODUCT CODE [id number]    Unit Type and Rh 6314    Blood Product Expiration Date 970263785885    ISSUING PHYSICIAN ^BABCOCK^PETER^E     EKG EKG Interpretation  Date/Time:  Wednesday September 08 2020 21:13:31 EST Ventricular Rate:  79 PR Interval:    QRS Duration: 78 QT Interval:  410 QTC Calculation: 470 R Axis:   8 Text Interpretation: Sinus rhythm No significant change since last tracing Confirmed by Lajean Saver [phone]) on 09/08/2020 9:35:58 PM   Radiology DG Chest Port 1 View  Result Date: 09/08/2020 CLINICAL DATA:  Shortness of breath and weakness. EXAM: PORTABLE CHEST 1 VIEW COMPARISON:  July 22, 2020 FINDINGS: Chronic appearing increased interstitial lung markings are seen, bilaterally. Mild linear scarring is seen within the right lung base. There is no evidence of a pleural effusion or pneumothorax. The heart size and mediastinal contours are within normal limits. Degenerative changes seen throughout the thoracic spine. IMPRESSION: Chronic appearing increased interstitial lung markings with right basilar linear scarring. Electronically Signed   By: Virgina Norfolk M.D.   On: 09/08/2020 22:07    Procedures Procedures (including critical  care time)  Medications Ordered in ED Medications - No data to display  ED Course  I have reviewed the triage vital signs and the nursing notes.  Pertinent labs & imaging results that were available during my care of the patient were reviewed by me and considered in my medical decision making (see chart for details).    MDM Rules/Calculators/A&P                          Iv ns. Continuous pulse ox and cardiac monitoring. Stat labs. Cxr. Ecg.  Reviewed nursing notes and prior charts for additional history.   Labs reviewed/interpreted by me - k normal.   CXR reviewed/interpreted by me - no pna.  Additional labs reviewed/interpreted by me - hgb decreased from prior 6.8, was 9.  Pt notes hx anemia, transfusions. Denies recent blood loss or melena. No abd or flank pain. Stool is yellow/brown. Anemia panel added. Transfuse 2 units prbc.   MDM Number of Diagnoses or Management Options   Amount and/or Complexity of Data Reviewed Clinical lab tests: ordered and reviewed Tests in the radiology section of CPT: ordered and reviewed Tests in the medicine section of CPT: ordered and reviewed Discussion of test results with the performing providers: yes Decide to obtain previous medical records or to obtain history from someone other than the patient: yes Obtain history from someone other than the patient: yes Review and summarize past medical records: yes Discuss the patient with other providers: yes Independent visualization of images, tracings, or specimens: yes  Risk of Complications, Morbidity, and/or Mortality Presenting problems: high Diagnostic procedures: high Management options: high   Hospitalists consulted for admission re symptomatic anemia, with sob, general weakness.   CRITICAL CARE RE: esrd/dialysis w symptomatic anemia and dyspnea requiring emergent prbc transfusion Performed by: Mirna Mires Total critical care time: 40 minutes Critical care time was exclusive of  separately billable procedures and treating other patients. Critical care was necessary to treat or prevent imminent or life-threatening deterioration. Critical care was time spent personally by me on the following activities: development of treatment plan with patient and/or surrogate as well as nursing, discussions with consultants, evaluation of patient's response to treatment, examination of patient, obtaining history from patient or surrogate, ordering and performing treatments and interventions, ordering and review of laboratory studies, ordering and review of radiographic studies, pulse oximetry and re-evaluation of patient's condition.   Final Clinical Impression(s) / ED Diagnoses Final diagnoses:  None    Rx / DC Orders ED Discharge Orders    None       Lajean Saver, MD 09/08/20 2350

## 2020-09-08 NOTE — ED Notes (Signed)

## 2020-09-08 NOTE — ED Notes (Signed)
Date and time results received: 09/08/20 10:53 PM (use smartphrase ".now" to insert current time)  Test: Hgb Critical Value: 6.8  Name of Provider Notified: Jarrett Soho, RN  Orders Received? Or Actions Taken?: Will notify Dr. Ashok Cordia

## 2020-09-08 NOTE — ED Triage Notes (Signed)
Pt c/o sob and weakness with chest pain intermittently x one month. Pt does dialysis every night.

## 2020-09-09 DIAGNOSIS — E1122 Type 2 diabetes mellitus with diabetic chronic kidney disease: Secondary | ICD-10-CM

## 2020-09-09 DIAGNOSIS — Z992 Dependence on renal dialysis: Secondary | ICD-10-CM

## 2020-09-09 DIAGNOSIS — I10 Essential (primary) hypertension: Secondary | ICD-10-CM | POA: Diagnosis not present

## 2020-09-09 DIAGNOSIS — D649 Anemia, unspecified: Secondary | ICD-10-CM | POA: Diagnosis not present

## 2020-09-09 DIAGNOSIS — N186 End stage renal disease: Secondary | ICD-10-CM | POA: Diagnosis not present

## 2020-09-09 DIAGNOSIS — Z794 Long term (current) use of insulin: Secondary | ICD-10-CM

## 2020-09-09 LAB — RENAL FUNCTION PANEL
Albumin: 2 g/dL — ABNORMAL LOW (ref 3.5–5.0)
Anion gap: 15 (ref 5–15)
BUN: 64 mg/dL — ABNORMAL HIGH (ref 8–23)
CO2: 23 mmol/L (ref 22–32)
Calcium: 7.7 mg/dL — ABNORMAL LOW (ref 8.9–10.3)
Chloride: 100 mmol/L (ref 98–111)
Creatinine, Ser: 5.95 mg/dL — ABNORMAL HIGH (ref 0.44–1.00)
GFR, Estimated: 7 mL/min — ABNORMAL LOW (ref >60)
Glucose, Bld: 208 mg/dL — ABNORMAL HIGH (ref 70–99)
Phosphorus: 5.7 mg/dL — ABNORMAL HIGH (ref 2.5–4.6)
Potassium: 3.7 mmol/L (ref 3.5–5.1)
Sodium: 138 mmol/L (ref 135–145)

## 2020-09-09 LAB — FERRITIN: Ferritin: 1821 ng/mL — ABNORMAL HIGH (ref 11–307)

## 2020-09-09 LAB — CBG MONITORING, ED
Glucose-Capillary: 183 mg/dL — ABNORMAL HIGH (ref 70–99)
Glucose-Capillary: 159 mg/dL — ABNORMAL HIGH (ref 70–99)

## 2020-09-09 LAB — CBC
HCT: 31.3 % — ABNORMAL LOW (ref 36.0–46.0)
Hemoglobin: 9.8 g/dL — ABNORMAL LOW (ref 12.0–15.0)
MCH: 27.6 pg (ref 26.0–34.0)
MCHC: 31.3 g/dL (ref 30.0–36.0)
MCV: 88.2 fL (ref 80.0–100.0)
Platelets: 262 10*3/uL (ref 150–400)
RBC: 3.55 MIL/uL — ABNORMAL LOW (ref 3.87–5.11)
RDW: 16.1 % — ABNORMAL HIGH (ref 11.5–15.5)
WBC: 6 10*3/uL (ref 4.0–10.5)
nRBC: 0 % (ref 0.0–0.2)

## 2020-09-09 LAB — PREPARE RBC (CROSSMATCH)

## 2020-09-09 LAB — IRON AND TIBC
Iron: 10 ug/dL — ABNORMAL LOW (ref 28–170)
Saturation Ratios: 8 % — ABNORMAL LOW (ref 10.4–31.8)
TIBC: 131 ug/dL — ABNORMAL LOW (ref 250–450)
UIBC: 121 ug/dL

## 2020-09-09 LAB — FOLATE: Folate: 9.7 ng/mL (ref >5.9)

## 2020-09-09 LAB — TROPONIN I (HIGH SENSITIVITY): Troponin I (High Sensitivity): 37 ng/L — ABNORMAL HIGH (ref <18)

## 2020-09-09 LAB — VITAMIN B12: Vitamin B-12: 482 pg/mL (ref 180–914)

## 2020-09-09 MED ORDER — ACETAMINOPHEN 325 MG PO TABS: 650.0000 mg | ORAL_TABLET | Freq: Four times a day (QID) | ORAL | Status: DC | PRN

## 2020-09-09 MED ORDER — INSULIN ASPART 100 UNIT/ML ~~LOC~~ SOLN: 0.0000 [IU] | Freq: Every day | SUBCUTANEOUS | Status: DC

## 2020-09-09 MED ORDER — FERROUS SULFATE 134 MG PO TABS: 134.0000 mg | ORAL_TABLET | Freq: Every day | ORAL | 1 refills | Status: DC

## 2020-09-09 MED ORDER — HEPARIN SODIUM (PORCINE) 5000 UNIT/ML IJ SOLN
5000.0000 [IU] | Freq: Three times a day (TID) | INTRAMUSCULAR | Status: DC
  Administered 2020-09-09: 5000 [IU] via SUBCUTANEOUS
  Filled 2020-09-09: qty 1

## 2020-09-09 MED ORDER — FUROSEMIDE 10 MG/ML IJ SOLN
40.0000 mg | Freq: Once | INTRAMUSCULAR | Status: AC
  Administered 2020-09-09: 40 mg via INTRAVENOUS
  Filled 2020-09-09: qty 4

## 2020-09-09 MED ORDER — INSULIN ASPART 100 UNIT/ML ~~LOC~~ SOLN
0.0000 [IU] | Freq: Three times a day (TID) | SUBCUTANEOUS | Status: DC
  Administered 2020-09-09 (×2): 1 [IU] via SUBCUTANEOUS
  Filled 2020-09-09: qty 1

## 2020-09-09 MED ORDER — ACETAMINOPHEN 650 MG RE SUPP: 650.0000 mg | Freq: Four times a day (QID) | RECTAL | Status: DC | PRN

## 2020-09-09 NOTE — ED Notes (Signed)
Assisted pt to bedside commode. Pt cleaned and repositioned in bed for comfort and denied any other needs at this time. Call bell within reach, bed in low position. Will continue to monitor.

## 2020-09-09 NOTE — H&P (Signed)
History and Physical    Catherine Burke JQB:[id number] DOB: 06/04/1941 DOA: 09/08/2020  PCP: Idelle Crouch, MD Patient coming from: Home  Chief Complaint: Shortness of breath, chest pain  HPI: Catherine Burke is a 80 y.o. female with medical history significant of ESRD on peritoneal dialysis, anemia of chronic disease, hypertension, hyperlipidemia, GERD, insulin-dependent diabetes mellitus presenting with complaints of shortness of breath and chest pain.  Patient states she has had shortness of breath even with exertion and intermittent episodes of substernal sharp chest pain associated with exertion for several months.  Denies any chest pain at rest.  States for the past 1 week her shortness of breath is much worse and now she can barely walk from her room to the bathroom.  She is very weak and feels short of breath even turning in the bed.  Denies hematemesis, hematochezia, or melena.  Denies abdominal pain.  States she receives EPO with dialysis every few weeks.  ED Course: Hemodynamically stable.  Placed on 2 L supplemental oxygen for comfort.  WBC 6.5, hemoglobin 6.8 (baseline 9-10), platelet count 282K.  Sodium 135, potassium 3.4, chloride 99, bicarb 21, BUN 59, creatinine 5.9, glucose 157.  SARS-CoV-2 PCR test negative.  Influenza panel negative.  High-sensitivity troponin mildly elevated but stable (38> 37).  B12 and folate levels within normal range.  Ferritin elevated at 1821. Iron saturation low at 8%, TIBC low at 131.  FOBT negative.  No grossly bloody stool or melena noted on rectal exam.  Chest x-ray showing chronic appearing increased interstitial lung markings with right basilar linear scarring.  2 units PRBCs ordered.  Review of Systems:  All systems reviewed and apart from history of presenting illness, are negative.  Past Medical History:  Diagnosis Date  . Arthritis   . Diabetes mellitus   . GERD (gastroesophageal reflux disease)   . HLD (hyperlipidemia)   .  Hypercholesterolemia   . Hypertension   . Kidney stones   . Osteoporosis   . Proteinuria     Past Surgical History:  Procedure Laterality Date  . ABDOMINAL HYSTERECTOMY    . BACK SURGERY     lumbar  . BREAST CYST ASPIRATION     unsure of laterality   . CAPD INSERTION N/A 02/13/2018   Procedure: LAPAROSCOPIC INSERTION CONTINUOUS AMBULATORY PERITONEAL DIALYSIS  (CAPD) CATHETER;  Surgeon: Algernon Huxley, MD;  Location: [facility] ORS;  Service: Vascular;  Laterality: N/A;  . CHOLECYSTECTOMY    . EYE SURGERY     bilateral cataract   . NASAL SEPTUM SURGERY       reports that she has never smoked. She has never used smokeless tobacco. She reports that she does not drink alcohol and does not use drugs.  Allergies  Allergen Reactions  . Penicillins Anaphylaxis  . Sulfa Antibiotics     Family History  Problem Relation Age of Onset  . Other Mother   . Heart attack Father   . Breast cancer Paternal Aunt     Prior to Admission medications   Medication Sig Start Date End Date Taking? Authorizing Provider  ACCU-CHEK AVIVA PLUS test strip TESTING TWICE DAILY. 08/11/14   Susy Frizzle, MD  Acetaminophen (TYLENOL) 325 MG CAPS Take 1 capsule by mouth daily as needed.    [provider]  atorvastatin (LIPITOR) 20 MG tablet Take 1 tablet (20 mg total) by mouth daily. Patient taking differently: Take 10 mg by mouth daily.  06/03/15   Alycia Rossetti, MD  benazepril (  LOTENSIN) 20 MG tablet Take by mouth. 08/06/18   [provider]  diclofenac Sodium (VOLTAREN) 1 % GEL Apply 2 g topically daily as needed.     [provider]  Ferrous Sulfate 134 MG TABS Take by mouth.    [provider]  hydrALAZINE (APRESOLINE) 100 MG tablet Take 1 tablet (100 mg total) by mouth every 8 (eight) hours. Patient taking differently: Take 50 mg by mouth every 8 (eight) hours.  10/13/17 07/21/20  Henreitta Leber, MD  Insulin Glargine (LANTUS SOLOSTAR) 100 UNIT/ML Solostar Pen Inject  20 Units into the skin at bedtime.  12/11/18   [provider]  Insulin Pen Needle 31G X 5 MM MISC As directed 03/31/15   Alycia Rossetti, MD  omeprazole (PRILOSEC) 40 MG capsule Take 40 mg by mouth daily.    [provider]  potassium chloride (KLOR-CON) 10 MEQ tablet Take 10 mEq by mouth daily. 03/11/20   [provider]  senna (SENOKOT) 8.6 MG tablet Take 2 tablets by mouth daily.    [provider]  spironolactone (ALDACTONE) 25 MG tablet Take 1 tablet (25 mg total) by mouth daily. 10/14/17 07/21/20  Henreitta Leber, MD  torsemide (DEMADEX) 20 MG tablet Take 20 mg by mouth daily as needed.     [provider]    Physical Exam: Vitals:   09/09/20 0205 09/09/20 0300 09/09/20 0330 09/09/20 0433  BP: (!) 141/64 (!) 147/61 (!) 151/66 (!) 156/65  Pulse: 67 68 69 68  Resp: 17 19 18 16   Temp: 98.7 F (37.1 C)   98.6 F (37 C)  TempSrc: Oral   Oral  SpO2: 100% 97% 100%   Weight:      Height:        Physical Exam Constitutional:      General: She is not in acute distress.    Comments: Appears pale and lethargic  HENT:     Head: Normocephalic and atraumatic.  Eyes:     Extraocular Movements: Extraocular movements intact.     Conjunctiva/sclera: Conjunctivae normal.  Cardiovascular:     Rate and Rhythm: Normal rate and regular rhythm.     Pulses: Normal pulses.  Pulmonary:     Effort: Pulmonary effort is normal. No respiratory distress.     Breath sounds: Normal breath sounds. No wheezing or rales.  Abdominal:     General: Bowel sounds are normal.     Palpations: Abdomen is soft.     Tenderness: There is no abdominal tenderness.  Musculoskeletal:        General: No swelling or tenderness.     Cervical back: Normal range of motion and neck supple.  Skin:    General: Skin is warm and dry.  Neurological:     General: No focal deficit present.     Mental Status: She is alert and oriented to person, place, and time.     Labs on  Admission: I have personally reviewed following labs and imaging studies  CBC: Recent Labs  Lab 09/08/20 2227  WBC 6.5  HGB 6.8*  HCT 22.5*  MCV 87.5  PLT 010   Basic Metabolic Panel: Recent Labs  Lab 09/08/20 2227  NA 135  K 3.4*  CL 99  CO2 21*  GLUCOSE 157*  BUN 59*  CREATININE 5.91*  CALCIUM 7.2*   GFR: Estimated Creatinine Clearance: 6.6 mL/min (A) (by C-G formula based on SCr of 5.91 mg/dL (H)). Liver Function Tests: Recent Labs  Lab 09/08/20  2227  AST 18  ALT 14  ALKPHOS 65  BILITOT 0.4  PROT 6.4*  ALBUMIN 2.0*   No results for input(s): LIPASE, AMYLASE in the last 168 hours. No results for input(s): AMMONIA in the last 168 hours. Coagulation Profile: No results for input(s): INR, PROTIME in the last 168 hours. Cardiac Enzymes: No results for input(s): CKTOTAL, CKMB, CKMBINDEX, TROPONINI in the last 168 hours. BNP (last 3 results) No results for input(s): PROBNP in the last 8760 hours. HbA1C: No results for input(s): HGBA1C in the last 72 hours. CBG: No results for input(s): GLUCAP in the last 168 hours. Lipid Profile: No results for input(s): CHOL, HDL, LDLCALC, TRIG, CHOLHDL, LDLDIRECT in the last 72 hours. Thyroid Function Tests: No results for input(s): TSH, T4TOTAL, FREET4, T3FREE, THYROIDAB in the last 72 hours. Anemia Panel: Recent Labs    09/08/20 2227  VITAMINB12 482  FOLATE 9.7  FERRITIN 1,821*  TIBC 131*  IRON 10*  RETICCTPCT 0.6   Urine analysis:    Component Value Date/Time   COLORURINE YELLOW 07/21/2020 1546   APPEARANCEUR CLEAR 07/21/2020 1546   LABSPEC 1.012 07/21/2020 1546   PHURINE 5.0 07/21/2020 1546   GLUCOSEU NEGATIVE 07/21/2020 1546   HGBUR SMALL (A) 07/21/2020 1546   BILIRUBINUR NEGATIVE 07/21/2020 1546   KETONESUR NEGATIVE 07/21/2020 1546   PROTEINUR 30 (A) 07/21/2020 1546   UROBILINOGEN 0.2 02/24/2015 0853   NITRITE NEGATIVE 07/21/2020 1546   LEUKOCYTESUR SMALL (A) 07/21/2020 1546    Radiological Exams  on Admission: DG Chest Port 1 View  Result Date: 09/08/2020 CLINICAL DATA:  Shortness of breath and weakness. EXAM: PORTABLE CHEST 1 VIEW COMPARISON:  July 22, 2020 FINDINGS: Chronic appearing increased interstitial lung markings are seen, bilaterally. Mild linear scarring is seen within the right lung base. There is no evidence of a pleural effusion or pneumothorax. The heart size and mediastinal contours are within normal limits. Degenerative changes seen throughout the thoracic spine. IMPRESSION: Chronic appearing increased interstitial lung markings with right basilar linear scarring. Electronically Signed   By: Virgina Norfolk M.D.   On: 09/08/2020 22:07    EKG: Independently reviewed.  Sinus rhythm, no acute changes.  Assessment/Plan Principal Problem:   Symptomatic anemia Active Problems:   Diabetes mellitus ([place])   Hyperlipidemia   Essential hypertension   ESRD (end stage renal disease) (H[facility])   Symptomatic acute on chronic anemia: Patient presenting with complaints of progressively worsening dyspnea on exertion, exertional chest pain, and generalized weakness. Hemoglobin down to 6.8, baseline appears to be in the 9-10 range.  FOBT negative.  No grossly bloody stool or melena noted on rectal exam.  B12 and folate levels within normal range.  Ferritin elevated at 1821. Iron saturation low at 8%, TIBC low at 131. Suspect anemia is related to chronic kidney disease. -Type and screen, 2 units PRBCs ordered in the ED.  Follow-up posttransfusion H&H.  Consult nephrology in the morning.  ESRD on peritoneal dialysis: Potassium 3.4, bicarb 21.  No pulmonary edema on chest x-ray. -Consult nephrology in the morning.  Mild troponin elevation: Likely due to demand ischemia in the setting of worsening anemia.  High-sensitivity troponin mildly elevated but stable (38> 37).  EKG without acute changes.  Patient is not endorsing any chest pain at present. -Cardiac monitoring, continue to  monitor  Hypertension: Systolic currently in the 140s to 150s. -Resume home meds after pharmacy med rec is done.  Hyperlipidemia -Resume statin after pharmacy med rec is done.  Well-controlled insulin-dependent diabetes mellitus:  A1c 6.0 on 07/22/2020. -Sliding scale insulin very sensitive ACHS.  Resume home basal insulin after pharmacy med rec is done.  DVT prophylaxis: Subcutaneous heparin Code Status: Patient wishes to be full code. Family Communication: No family available at this time. Disposition Plan: Status is: Observation  The patient remains OBS appropriate and will d/c before 2 midnights.  Dispo: The patient is from: Home              Anticipated d/c is to: Home              Anticipated d/c date is: 2 days              Patient currently is not medically stable to d/c.  The medical decision making on this patient was of high complexity and the patient is at high risk for clinical deterioration, therefore this is a level 3 visit.  Shela Leff MD [facility]  If 7PM-7AM, please contact night-coverage [email or web address]  09/09/2020, 4:39 AM

## 2020-09-09 NOTE — ED Notes (Addendum)
Assisted Dr. Ashok Cordia with rectal exam. Pt tolerated well and appeared to be in NAD.

## 2020-09-09 NOTE — Discharge Summary (Signed)
Discharge Summary  Catherine Burke LGX:[id number] DOB: [birth year]  PCP: Idelle Crouch, MD  Admit date: 09/08/2020 Discharge date: 09/09/2020  Time spent: 25 minutes  Recommendations for Outpatient Follow-up:  1. Patient will resume her peritoneal dialysis upon returning home today. 2. She will follow-up with her nephrologist as usually scheduled and primary care doctor in the next month  Discharge Diagnoses:  Active Hospital Problems   Diagnosis Date Noted  . Symptomatic anemia 07/22/2020  . ESRD (end stage renal disease) ([place]) 07/21/2020  . Diabetes mellitus ([place]) 01/17/2007  . Essential hypertension 01/17/2007  . Hyperlipidemia 01/17/2007    Resolved Hospital Problems  No resolved problems to display.    Discharge Condition: Improved, being discharged home  Diet recommendation: Renal diet  Vitals:   09/09/20 1300 09/09/20 1330  BP: (!) 165/69 (!) 156/70  Pulse: 63 68  Resp: (!) 21 19  Temp:    SpO2: 98% 99%    History of present illness:  80 year old female with past medical history of end-stage renal disease on peritoneal dialysis plus anemia of chronic disease, hypertension diabetes mellitus presented with shortness of breath.  In the emergency room, she was not found to be hypoxic but hemoglobin was found to be 6.8 with a baseline of 9-10.  No signs of any bleeding were noted.  She was not hypotensive.  Hemoccult negative.  Brought into the hospitalist service.  Hospital Course:  Principal Problem: Anemia of chronic disease/symptomatic anemia: Likely hemoglobin drifted down secondary to her renal disease.  She was transfused 2 units packed red blood cells.  She tolerated this well and by following day, hemoglobin up to 9.8.  Found to be stable and being discharged home. Active Problems:   Diabetes mellitus ([place]): CBG stable, covered with sliding scale   Hyperlipidemia  Elevated troponin: Minimally elevated at 37.  No association with ACS more likely due to her  renal disease    Essential hypertension: Blood pressure slightly elevated following transfusion of blood.  Improved with diuresis.  Continue home medications.    ESRD (end stage renal disease) ([place]): On peritoneal dialysis.  Her last dialysis session was on 1/4.  She still able to make urine and so following blood transfusion, she was given 40 mg of IV Lasix and diuresed well.  Plan will be for her to be discharged home and resume peritoneal dialysis.  Follow-up with nephrology accordingly   Procedures:  Transfuse 2 units packed red blood cells  Consultations:  None  Discharge Exam: BP (!) 156/70   Pulse 68   Temp 98.9 F (37.2 C) (Oral)   Resp 19   Ht 5\' 4"  (1.626 m)   Wt 57.6 kg   SpO2 99%   BMI 21.80 kg/m   General: Alert and oriented x3, no acute distress Cardiovascular: Regular rate and rhythm, S1-S2 Respiratory: Clear to auscultation bilaterally  Discharge Instructions You were cared for by a hospitalist during your hospital stay. If you have any questions about your discharge medications or the care you received while you were in the hospital after you are discharged, you can call the unit and asked to speak with the hospitalist on call if the hospitalist that took care of you is not available. Once you are discharged, your primary care physician will handle any further medical issues. Please note that NO REFILLS for any discharge medications will be authorized once you are discharged, as it is imperative that you return to your primary care physician (or establish a relationship with  a primary care physician if you do not have one) for your aftercare needs so that they can reassess your need for medications and monitor your lab values.  Discharge Instructions    Diet - low sodium heart healthy   Complete by: As directed    Increase activity slowly   Complete by: As directed      Allergies as of 09/09/2020      Reactions   Penicillins Anaphylaxis   Sulfa Antibiotics        Medication List    TAKE these medications   Accu-Chek Aviva Plus test strip Generic drug: glucose blood TESTING TWICE DAILY.   atorvastatin 10 MG tablet Commonly known as: LIPITOR Take 10 mg by mouth daily.   benazepril 20 MG tablet Commonly known as: LOTENSIN Take 20 mg by mouth daily.   diclofenac Sodium 1 % Gel Commonly known as: VOLTAREN Apply 2 g topically daily as needed (pain).   Ferrous Sulfate 134 MG Tabs Take 1 tablet (134 mg total) by mouth daily. What changed:   how much to take  when to take this   hydrALAZINE 100 MG tablet Commonly known as: APRESOLINE Take 1 tablet (100 mg total) by mouth every 8 (eight) hours.   Insulin Pen Needle 31G X 5 MM Misc As directed   isosorbide mononitrate 30 MG 24 hr tablet Commonly known as: IMDUR Take 30 mg by mouth daily.   Lantus SoloStar 100 UNIT/ML Solostar Pen Generic drug: insulin glargine Inject 20 Units into the skin at bedtime.   mirtazapine 7.5 MG tablet Commonly known as: REMERON Take 7.5 mg by mouth at bedtime.   omeprazole 40 MG capsule Commonly known as: PRILOSEC Take 40 mg by mouth daily.   potassium chloride 10 MEQ tablet Commonly known as: KLOR-CON Take 10 mEq by mouth daily.   senna 8.6 MG tablet Commonly known as: SENOKOT Take 2 tablets by mouth daily.   spironolactone 25 MG tablet Commonly known as: ALDACTONE Take 1 tablet (25 mg total) by mouth daily.   torsemide 20 MG tablet Commonly known as: DEMADEX Take 20 mg by mouth daily as needed (fluid).   Tylenol 325 MG Caps Generic drug: Acetaminophen Take 1 capsule by mouth daily as needed (pain).      Allergies  Allergen Reactions  . Penicillins Anaphylaxis  . Sulfa Antibiotics       The results of significant diagnostics from this hospitalization (including imaging, microbiology, ancillary and laboratory) are listed below for reference.    Significant Diagnostic Studies: DG Chest Port 1 View  Result Date:  09/08/2020 CLINICAL DATA:  Shortness of breath and weakness. EXAM: PORTABLE CHEST 1 VIEW COMPARISON:  July 22, 2020 FINDINGS: Chronic appearing increased interstitial lung markings are seen, bilaterally. Mild linear scarring is seen within the right lung base. There is no evidence of a pleural effusion or pneumothorax. The heart size and mediastinal contours are within normal limits. Degenerative changes seen throughout the thoracic spine. IMPRESSION: Chronic appearing increased interstitial lung markings with right basilar linear scarring. Electronically Signed   By: Virgina Norfolk M.D.   On: 09/08/2020 22:07    Microbiology: Recent Results (from the past 240 hour(s))  Resp Panel by RT-PCR (Flu A&B, Covid) Nasopharyngeal Swab     Status: None   Collection Time: 09/08/20  9:24 PM   Specimen: Nasopharyngeal Swab; Nasopharyngeal(NP) swabs in vial transport medium  Result Value Ref Range Status   SARS Coronavirus 2 by RT PCR NEGATIVE NEGATIVE Final  Comment: (NOTE) SARS-CoV-2 target nucleic acids are NOT DETECTED.  The SARS-CoV-2 RNA is generally detectable in upper respiratory specimens during the acute phase of infection. The lowest concentration of SARS-CoV-2 viral copies this assay can detect is 138 copies/mL. A negative result does not preclude SARS-Cov-2 infection and should not be used as the sole basis for treatment or other patient management decisions. A negative result may occur with  improper specimen collection/handling, submission of specimen other than nasopharyngeal swab, presence of viral mutation(s) within the areas targeted by this assay, and inadequate number of viral copies(<138 copies/mL). A negative result must be combined with clinical observations, patient history, and epidemiological information. The expected result is Negative.  Fact Sheet for Patients:  EntrepreneurPulse.com.au  Fact Sheet for Healthcare Providers:   IncredibleEmployment.be  This test is no t yet approved or cleared by the [place] FDA and  has been authorized for detection and/or diagnosis of SARS-CoV-2 by FDA under an Emergency Use Authorization (EUA). This EUA will remain  in effect (meaning this test can be used) for the duration of the COVID-19 declaration under Section 564(b)(1) of the Act, 21 U.S.C.section 360bbb-3(b)(1), unless the authorization is terminated  or revoked sooner.       Influenza A by PCR NEGATIVE NEGATIVE Final   Influenza B by PCR NEGATIVE NEGATIVE Final    Comment: (NOTE) The Xpert Xpress SARS-CoV-2/FLU/RSV plus assay is intended as an aid in the diagnosis of influenza from Nasopharyngeal swab specimens and should not be used as a sole basis for treatment. Nasal washings and aspirates are unacceptable for Xpert Xpress SARS-CoV-2/FLU/RSV testing.  Fact Sheet for Patients: EntrepreneurPulse.com.au  Fact Sheet for Healthcare Providers: IncredibleEmployment.be  This test is not yet approved or cleared by the [place] FDA and has been authorized for detection and/or diagnosis of SARS-CoV-2 by FDA under an Emergency Use Authorization (EUA). This EUA will remain in effect (meaning this test can be used) for the duration of the COVID-19 declaration under Section 564(b)(1) of the Act, 21 U.S.C. section 360bbb-3(b)(1), unless the authorization is terminated or revoked.  Performed at [facility], [address] [place], [place] [address]      Labs: Basic Metabolic Panel: Recent Labs  Lab 09/08/20 2227 09/09/20 1038  NA 135 138  K 3.4* 3.7  CL 99 100  CO2 21* 23  GLUCOSE 157* 208*  BUN 59* 64*  CREATININE 5.91* 5.95*  CALCIUM 7.2* 7.7*  PHOS  --  5.7*   Liver Function Tests: Recent Labs  Lab 09/08/20 2227 09/09/20 1038  AST 18  --   ALT 14  --   ALKPHOS 65  --   BILITOT 0.4  --   PROT 6.4*  --   ALBUMIN 2.0* 2.0*   No  results for input(s): LIPASE, AMYLASE in the last 168 hours. No results for input(s): AMMONIA in the last 168 hours. CBC: Recent Labs  Lab 09/08/20 2227 09/09/20 1038  WBC 6.5 6.0  HGB 6.8* 9.8*  HCT 22.5* 31.3*  MCV 87.5 88.2  PLT 282 262   Cardiac Enzymes: No results for input(s): CKTOTAL, CKMB, CKMBINDEX, TROPONINI in the last 168 hours. BNP: BNP (last 3 results) No results for input(s): BNP in the last 8760 hours.  ProBNP (last 3 results) No results for input(s): PROBNP in the last 8760 hours.  CBG: Recent Labs  Lab 09/09/20 0811 09/09/20 1156  GLUCAP 183* 159*       Signed:  Annita Brod, MD [facility] 09/09/2020, 1:40  PM

## 2020-09-09 NOTE — ED Notes (Signed)
Removed pts. O2. Pt. Currently sitting at 95% on RA. Pt. Is resting.

## 2020-09-10 LAB — TYPE AND SCREEN
ABO/RH(D): A POS
Antibody Screen: NEGATIVE
Unit division: 0
Unit division: 0

## 2020-09-10 LAB — BPAM RBC
Blood Product Expiration Date: 202201242359
Blood Product Expiration Date: 202201312359
ISSUE DATE / TIME: 202201060132
ISSUE DATE / TIME: 202201060427
Unit Type and Rh: 6200
Unit Type and Rh: 6200

## 2020-10-12 ENCOUNTER — Inpatient Hospital Stay (HOSPITAL_COMMUNITY): Payer: Medicare PPO | Attending: Hematology | Admitting: Hematology

## 2020-10-12 ENCOUNTER — Other Ambulatory Visit: Payer: Self-pay

## 2020-10-12 ENCOUNTER — Inpatient Hospital Stay (HOSPITAL_COMMUNITY): Payer: Medicare PPO

## 2020-10-12 ENCOUNTER — Encounter (HOSPITAL_COMMUNITY): Payer: Self-pay | Admitting: Hematology

## 2020-10-12 VITALS — BP 158/74 | HR 79 | Temp 98.1°F | Resp 18 | Ht 64.0 in | Wt 128.0 lb

## 2020-10-12 DIAGNOSIS — D631 Anemia in chronic kidney disease: Secondary | ICD-10-CM

## 2020-10-12 DIAGNOSIS — Z992 Dependence on renal dialysis: Secondary | ICD-10-CM | POA: Diagnosis not present

## 2020-10-12 DIAGNOSIS — D472 Monoclonal gammopathy: Secondary | ICD-10-CM | POA: Diagnosis not present

## 2020-10-12 DIAGNOSIS — E1122 Type 2 diabetes mellitus with diabetic chronic kidney disease: Secondary | ICD-10-CM

## 2020-10-12 DIAGNOSIS — N189 Chronic kidney disease, unspecified: Secondary | ICD-10-CM | POA: Diagnosis present

## 2020-10-12 DIAGNOSIS — I120 Hypertensive chronic kidney disease with stage 5 chronic kidney disease or end stage renal disease: Secondary | ICD-10-CM | POA: Diagnosis not present

## 2020-10-12 DIAGNOSIS — N186 End stage renal disease: Secondary | ICD-10-CM

## 2020-10-12 LAB — CBC WITH DIFFERENTIAL/PLATELET
Abs Immature Granulocytes: 0.07 10*3/uL (ref 0.00–0.07)
Basophils Absolute: 0 10*3/uL (ref 0.0–0.1)
Basophils Relative: 1 %
Eosinophils Absolute: 0.2 10*3/uL (ref 0.0–0.5)
Eosinophils Relative: 4 %
HCT: 27.7 % — ABNORMAL LOW (ref 36.0–46.0)
Hemoglobin: 8.5 g/dL — ABNORMAL LOW (ref 12.0–15.0)
Immature Granulocytes: 2 %
Lymphocytes Relative: 18 %
Lymphs Abs: 0.7 10*3/uL (ref 0.7–4.0)
MCH: 28.1 pg (ref 26.0–34.0)
MCHC: 30.7 g/dL (ref 30.0–36.0)
MCV: 91.4 fL (ref 80.0–100.0)
Monocytes Absolute: 0.2 10*3/uL (ref 0.1–1.0)
Monocytes Relative: 5 %
Neutro Abs: 2.6 10*3/uL (ref 1.7–7.7)
Neutrophils Relative %: 70 %
Platelets: 255 10*3/uL (ref 150–400)
RBC: 3.03 MIL/uL — ABNORMAL LOW (ref 3.87–5.11)
RDW: 18.2 % — ABNORMAL HIGH (ref 11.5–15.5)
WBC: 3.7 10*3/uL — ABNORMAL LOW (ref 4.0–10.5)
nRBC: 0 % (ref 0.0–0.2)

## 2020-10-12 LAB — HIV ANTIBODY (ROUTINE TESTING W REFLEX): HIV Screen 4th Generation wRfx: NONREACTIVE

## 2020-10-12 LAB — RETICULOCYTES
Immature Retic Fract: 30.3 % — ABNORMAL HIGH (ref 2.3–15.9)
RBC.: 3.02 MIL/uL — ABNORMAL LOW (ref 3.87–5.11)
Retic Count, Absolute: 85.5 10*3/uL (ref 19.0–186.0)
Retic Ct Pct: 2.8 % (ref 0.4–3.1)

## 2020-10-12 LAB — IRON AND TIBC
Iron: 25 ug/dL — ABNORMAL LOW (ref 28–170)
Saturation Ratios: 11 % (ref 10.4–31.8)
TIBC: 218 ug/dL — ABNORMAL LOW (ref 250–450)
UIBC: 193 ug/dL

## 2020-10-12 LAB — FOLATE: Folate: 8.5 ng/mL (ref >5.9)

## 2020-10-12 LAB — SEDIMENTATION RATE: Sed Rate: 105 mm/hr — ABNORMAL HIGH (ref 0–22)

## 2020-10-12 LAB — LACTATE DEHYDROGENASE: LDH: 254 U/L — ABNORMAL HIGH (ref 98–192)

## 2020-10-12 LAB — HEPATITIS C ANTIBODY: HCV Ab: NONREACTIVE

## 2020-10-12 LAB — VITAMIN B12: Vitamin B-12: 449 pg/mL (ref 180–914)

## 2020-10-12 LAB — FERRITIN: Ferritin: 1186 ng/mL — ABNORMAL HIGH (ref 11–307)

## 2020-10-12 LAB — C-REACTIVE PROTEIN: CRP: 5.3 mg/dL — ABNORMAL HIGH (ref <1.0)

## 2020-10-12 NOTE — Progress Notes (Signed)
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 female is here because of evaluation of anemia due to chronic kidney disease, at the request of Dr. Lavonia Dana from [facility].  Today she reports feeling okay. 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 She reports that she had to have blood transfusions four times in the past 1 year; she notes that her transfusion requirement has increased over the past 1 year compared to the previous 2 years. Her last PRBC transfusion was on 09/08/2020. She gets peritoneal dialysis for 9 hours at home and is also receiving Epogen 36,000 every 2 weeks. Her appetite has gone down since starting dialysis and lost about 40 lbs in the past 3 years. She is taking an iron tablet daily and has never had an iron transfusion. She has never had any DVT's or MI's. She denies having any recent bone fractures or falls. She denies having any recent F/C, night sweats or infections of her catheter.  She lives at home with her husband who has dementia; she is his caretaker. She used to work as a Education officer, museum. She has never smoked. She denies family history of anemia or needing transfusions. Her PGF had pancreatic/stomach cancer; paternal aunt had breast cancer.   MEDICAL HISTORY:  Past Medical History:  Diagnosis Date  . Anemia   . Arthritis   . Diabetes mellitus   . GERD (gastroesophageal reflux disease)   . HLD (hyperlipidemia)   . Hypercholesterolemia   . Hypertension   . Kidney stones   . Osteoporosis   . Proteinuria     SURGICAL HISTORY: Past Surgical History:   Procedure Laterality Date  . ABDOMINAL HYSTERECTOMY    . BACK SURGERY     lumbar  . BREAST CYST ASPIRATION     unsure of laterality   . CAPD INSERTION N/A 02/13/2018   Procedure: LAPAROSCOPIC INSERTION CONTINUOUS AMBULATORY PERITONEAL DIALYSIS  (CAPD) CATHETER;  Surgeon: Algernon Huxley, MD;  Location: [facility] ORS;  Service: Vascular;  Laterality: N/A;  . CHOLECYSTECTOMY    . EYE SURGERY     bilateral cataract   . NASAL SEPTUM SURGERY      SOCIAL HISTORY: Social History   Socioeconomic History  . Marital status: Married    Spouse name: Not on file  . Number of children: 3  . Years of education: Not on file  . Highest education level: Not on file  Occupational History  . Occupation: retired  Tobacco Use  . Smoking status: Never Smoker  . Smokeless tobacco: Never Used  Vaping Use  . Vaping Use: Never used  Substance and Sexual Activity  . Alcohol use: No    Alcohol/week: 0.0 standard drinks  . Drug use: No  . Sexual activity: Yes  Other Topics Concern  . Not on file  Social History Narrative  . Not on file   Social Determinants of Health   Financial Resource Strain: Low Risk   . Difficulty of Paying Living Expenses: Not hard at all  Food Insecurity: No Food Insecurity  . Worried About Charity fundraiser in the Last Year: Never true  . Ran  Out of Food in the Last Year: Never true  Transportation Needs: No Transportation Needs  . Lack of Transportation (Medical): No  . Lack of Transportation (Non-Medical): No  Physical Activity: Sufficiently Active  . Days of Exercise per Week: 5 days  . Minutes of Exercise per Session: 30 min  Stress: No Stress Concern Present  . Feeling of Stress : Not at all  Social Connections: Socially Integrated  . Frequency of Communication with Friends and Family: More than three times a week  . Frequency of Social Gatherings with Friends and Family: More than three times a week  . Attends Religious Services: More than 4 times per year  .  Active Member of Clubs or Organizations: Yes  . Attends Archivist Meetings: More than 4 times per year  . Marital Status: Married  Human resources officer Violence: Not At Risk  . Fear of Current or Ex-Partner: No  . Emotionally Abused: No  . Physically Abused: No  . Sexually Abused: No    FAMILY HISTORY: Family History  Problem Relation Age of Onset  . Other Mother   . Heart attack Father   . Breast cancer Paternal Aunt     ALLERGIES:  is allergic to levofloxacin, penicillins, other, penicillin g, and sulfa antibiotics.  MEDICATIONS:  Current Outpatient Medications  Medication Sig Dispense Refill  . Magnesium Oxide (MAGNESIUM OXIDE 400) 240 MG PACK Take 1 tablet by mouth daily.    Marland Kitchen ACCU-CHEK AVIVA PLUS test strip TESTING TWICE DAILY. 100 each 11  . Acetaminophen (TYLENOL) 325 MG CAPS Take 1 capsule by mouth daily as needed (pain).    Marland Kitchen atorvastatin (LIPITOR) 10 MG tablet Take 10 mg by mouth daily.    . benazepril (LOTENSIN) 20 MG tablet Take 20 mg by mouth 2 (two) times daily.    . diclofenac Sodium (VOLTAREN) 1 % GEL Apply 2 g topically daily as needed (pain).    . Ferrous Sulfate 134 MG TABS Take 1 tablet (134 mg total) by mouth daily. 30 tablet 1  . hydrALAZINE (APRESOLINE) 100 MG tablet Take 1 tablet (100 mg total) by mouth every 8 (eight) hours. 90 tablet 1  . Insulin Glargine (LANTUS SOLOSTAR) 100 UNIT/ML Solostar Pen Inject 20 Units into the skin at bedtime.     . Insulin Pen Needle 31G X 5 MM MISC As directed 100 each 5  . isosorbide mononitrate (IMDUR) 30 MG 24 hr tablet Take 30 mg by mouth daily.    . mirtazapine (REMERON) 7.5 MG tablet Take 7.5 mg by mouth at bedtime.    Marland Kitchen omeprazole (PRILOSEC) 40 MG capsule Take 40 mg by mouth daily.    . potassium chloride (KLOR-CON) 10 MEQ tablet Take 10 mEq by mouth daily.    Marland Kitchen senna (SENOKOT) 8.6 MG tablet Take 2 tablets by mouth daily.    Marland Kitchen spironolactone (ALDACTONE) 25 MG tablet Take 1 tablet (25 mg total) by mouth daily.  30 tablet 1  . torsemide (DEMADEX) 20 MG tablet Take 20 mg by mouth daily as needed (fluid).     No current facility-administered medications for this visit.    REVIEW OF SYSTEMS:   Review of Systems  Constitutional: Positive for appetite change (50%), fatigue (75%) and unexpected weight change (lost 40 lbs in 3 years). Negative for chills, diaphoresis and fever.  HENT:   Positive for trouble swallowing.   Cardiovascular: Positive for leg swelling.  All other systems reviewed and are negative.    PHYSICAL EXAMINATION: ECOG PERFORMANCE  STATUS: 1 - Symptomatic but completely ambulatory  Vitals:   10/12/20 1341  BP: (!) 158/74  Pulse: 79  Resp: 18  Temp: 98.1 F (36.7 C)  SpO2: 100%   Filed Weights   10/12/20 1341  Weight: 128 lb (58.1 kg)   Physical Exam Vitals reviewed.  Constitutional:      Appearance: Normal appearance.  Cardiovascular:     Rate and Rhythm: Normal rate and regular rhythm.     Pulses: Normal pulses.     Heart sounds: Normal heart sounds.  Pulmonary:     Effort: Pulmonary effort is normal.     Breath sounds: Normal breath sounds.  Chest:  Breasts:     Right: No axillary adenopathy or supraclavicular adenopathy.     Left: No axillary adenopathy or supraclavicular adenopathy.    Abdominal:     Palpations: Abdomen is soft. There is no hepatomegaly or mass.     Tenderness: There is no abdominal tenderness.     Comments: Peritoneal dialysis catheter  Musculoskeletal:     Right lower leg: No edema.     Left lower leg: No edema.  Lymphadenopathy:     Cervical: No cervical adenopathy.     Upper Body:     Right upper body: No supraclavicular, axillary or pectoral adenopathy.     Left upper body: No supraclavicular, axillary or pectoral adenopathy.  Neurological:     General: No focal deficit present.     Mental Status: She is alert and oriented to person, place, and time.  Psychiatric:        Mood and Affect: Mood normal.        Behavior:  Behavior normal.      LABORATORY DATA:  I have reviewed the data as listed Recent Results (from the past 2160 hour(s))  CBC     Status: Abnormal   Collection Time: 09/08/20 10:27 PM  Result Value Ref Range   WBC 6.5 4.0 - 10.5 K/uL   RBC 2.57 (L) 3.87 - 5.11 MIL/uL   Hemoglobin 6.8 (LL) 12.0 - 15.0 g/dL    Comment: REPEATED TO VERIFY THIS CRITICAL RESULT HAS VERIFIED AND BEEN CALLED TO [facility] BY JAMIE WOODIE ON 01 05 2022 AT 2252, AND HAS BEEN READ BACK.     HCT 22.5 (L) 36.0 - 46.0 %   MCV 87.5 80.0 - 100.0 fL   MCH 26.5 26.0 - 34.0 pg   MCHC 30.2 30.0 - 36.0 g/dL   RDW 17.6 (H) 11.5 - 15.5 %   Platelets 282 150 - 400 K/uL   nRBC 0.0 0.0 - 0.2 %    Comment: Performed at [facility], [address], [place], [place] [address]  Comprehensive metabolic panel     Status: Abnormal   Collection Time: 09/08/20 10:27 PM  Result Value Ref Range   Sodium 135 135 - 145 mmol/L   Potassium 3.4 (L) 3.5 - 5.1 mmol/L   Chloride 99 98 - 111 mmol/L   CO2 21 (L) 22 - 32 mmol/L   Glucose, Bld 157 (H) 70 - 99 mg/dL    Comment: Glucose reference range applies only to samples taken after fasting for at least 8 hours.   BUN 59 (H) 8 - 23 mg/dL   Creatinine, Ser 5.91 (H) 0.44 - 1.00 mg/dL   Calcium 7.2 (L) 8.9 - 10.3 mg/dL   Total Protein 6.4 (L) 6.5 - 8.1 g/dL   Albumin 2.0 (L) 3.5 - 5.0 g/dL   AST 18 15 -  41 U/L   ALT 14 0 - 44 U/L   Alkaline Phosphatase 65 38 - 126 U/L   Total Bilirubin 0.4 0.3 - 1.2 mg/dL   GFR, Estimated 7 (L) >60 mL/min    Comment: (NOTE) Calculated using the CKD-EPI Creatinine Equation (2021)    Anion gap 15 5 - 15    Comment: Performed at [facility], [address], [place], [place] [address]  Troponin I (High Sensitivity)     Status: Abnormal   Collection Time: 09/08/20 10:27 PM  Result Value Ref Range   Troponin I (High Sensitivity) 38 (H) <18 ng/L    Comment: (NOTE) Elevated high sensitivity troponin I (hsTnI) values and significant  changes across  serial measurements may suggest ACS but many other  chronic and acute conditions are known to elevate hsTnI results.  Refer to the "Links" section for chest pain algorithms and additional  guidance. Performed at [facility], [address] [place], [place] [address]   Vitamin B12     Status: None   Collection Time: 09/08/20 10:27 PM  Result Value Ref Range   Vitamin B-12 482 180 - 914 pg/mL    Comment: (NOTE) This assay is not validated for testing neonatal or myeloproliferative syndrome specimens for Vitamin B12 levels. Performed at [facility], [address] [place], [place] [address]   Folate     Status: None   Collection Time: 09/08/20 10:27 PM  Result Value Ref Range   Folate 9.7 >5.9 ng/mL    Comment: Performed at [facility], [address], [place], [place] [address]  Iron and TIBC     Status: Abnormal   Collection Time: 09/08/20 10:27 PM  Result Value Ref Range   Iron 10 (L) 28 - 170 ug/dL   TIBC 131 (L) 250 - 450 ug/dL   Saturation Ratios 8 (L) 10.4 - 31.8 %   UIBC 121 ug/dL    Comment: Performed at [facility], [address], [place], [place] [address]  Ferritin     Status: Abnormal   Collection Time: 09/08/20 10:27 PM  Result Value Ref Range   Ferritin 1,821 (H) 11 - 307 ng/mL    Comment: RESULT CONFIRMED BY AUTOMATED DILUTION Performed at [facility], [address], [place], [place] [address]   Reticulocytes     Status: Abnormal   Collection Time: 09/08/20 10:27 PM  Result Value Ref Range   Retic Ct Pct 0.6 0.4 - 3.1 %   RBC. 2.57 (L) 3.87 - 5.11 MIL/uL   Retic Count, Absolute 14.1 (L) 19.0 - 186.0 K/uL   Immature Retic Fract 15.3 2.3 - 15.9 %    Comment: Performed at [facility], [address], [place], [place] [address]  Type and screen     Status: None   Collection Time: 09/08/20 11:37 PM  Result Value Ref Range   ABO/RH(D) A POS    Antibody Screen NEG    Sample Expiration 09/11/2020,2359    Unit Number [id number]    Blood Component Type RED  CELLS,LR    Unit division 00    Status of Unit ISSUED,FINAL    Transfusion Status OK TO TRANSFUSE    Crossmatch Result Compatible    Unit Number [id number]    Blood Component Type RED CELLS,LR    Unit division 00    Status of Unit ISSUED,FINAL    Transfusion Status OK TO TRANSFUSE    Crossmatch Result      Compatible Performed at [facility], [address] [place], [place] [address]   BPAM RBC     Status: None   Collection Time: 09/08/20 11:37 PM  Result Value Ref Range   ISSUE DATE / TIME 650354656812    Blood Product Unit Number [id number]    PRODUCT CODE [id number]    Unit Type and Rh 4665    Blood Product Expiration Date 993570177939    ISSUE DATE / TIME 030092330076    Blood Product Unit Number [id number]    PRODUCT CODE [id number]    Unit Type and Rh 6200    Blood Product Expiration Date 876811572620   Prepare RBC (crossmatch)     Status: None   Collection Time: 09/08/20 11:46 PM  Result Value Ref Range   Order Confirmation      ORDER PROCESSED BY BLOOD BANK Performed at [facility], [address] [place], [place] [address]   POC occult blood, ED Provider will collect     Status: None   Collection Time: 09/08/20 11:58 PM  Result Value Ref Range   Fecal Occult Bld NEGATIVE NEGATIVE  Troponin I (High Sensitivity)     Status: Abnormal   Collection Time: 09/09/20 12:15 AM  Result Value Ref Range   Troponin I (High Sensitivity) 37 (H) <18 ng/L    Comment: (NOTE) Elevated high sensitivity troponin I (hsTnI) values and significant  changes across serial measurements may suggest ACS but many other  chronic and acute conditions are known to elevate hsTnI results.  Refer to the "Links" section for chest pain algorithms and additional  guidance. Performed at [facility], [address] [place], [place] [address]   CBG monitoring, ED     Status: Abnormal   Collection Time: 09/09/20  8:11 AM  Result Value Ref Range   Glucose-Capillary 183 (H) 70 - 99 mg/dL     Comment: Glucose reference range applies only to samples taken after fasting for at least 8 hours.  CBC     Status: Abnormal   Collection Time: 09/09/20 10:38 AM  Result Value Ref Range   WBC 6.0 4.0 - 10.5 K/uL   RBC 3.55 (L) 3.87 - 5.11 MIL/uL   Hemoglobin 9.8 (L) 12.0 - 15.0 g/dL    Comment: REPEATED TO VERIFY POST TRANSFUSION SPECIMEN    HCT 31.3 (L) 36.0 - 46.0 %   MCV 88.2 80.0 - 100.0 fL   MCH 27.6 26.0 - 34.0 pg   MCHC 31.3 30.0 - 36.0 g/dL   RDW 16.1 (H) 11.5 - 15.5 %   Platelets 262 150 - 400 K/uL   nRBC 0.0 0.0 - 0.2 %    Comment: Performed at [facility], [address] [place], [place] [address]  Renal function panel     Status: Abnormal   Collection Time: 09/09/20 10:38 AM  Result Value Ref Range   Sodium 138 135 - 145 mmol/L   Potassium 3.7 3.5 - 5.1 mmol/L   Chloride 100 98 - 111 mmol/L   CO2 23 22 - 32 mmol/L   Glucose, Bld 208 (H) 70 - 99 mg/dL    Comment: Glucose reference range applies only to samples taken after fasting for at least 8 hours.   BUN 64 (H) 8 - 23 mg/dL   Creatinine, Ser 5.95 (H) 0.44 - 1.00 mg/dL   Calcium 7.7 (L) 8.9 - 10.3 mg/dL   Phosphorus 5.7 (H) 2.5 - 4.6 mg/dL   Albumin 2.0 (L) 3.5 - 5.0 g/dL   GFR, Estimated 7 (L) >60 mL/min  Comment: (NOTE) Calculated using the CKD-EPI Creatinine Equation (2021)    Anion gap 15 5 - 15    Comment: Performed at [facility], [address] [place], [place] [address]  CBG monitoring, ED     Status: Abnormal   Collection Time: 09/09/20 11:56 AM  Result Value Ref Range   Glucose-Capillary 159 (H) 70 - 99 mg/dL    Comment: Glucose reference range applies only to samples taken after fasting for at least 8 hours.     RADIOGRAPHIC STUDIES: I have personally reviewed the radiological images as listed and agreed with the findings in the report. No results found.  ASSESSMENT:  1.  Normocytic anemia: -Referred by Dr. Juleen China for normocytic anemia, with decreased responsiveness to ESA. -Patient on  peritoneal dialysis since March 18, 2018 secondary to diabetes kidney disease. -She has been receiving 36,000 units of Epogen every 2 weeks. -Denies any bleeding per rectum or melena. -Received 2 units of PRBC in January, last transfusion on 09/08/2020.  Total 4 transfusions in the last 1 year.  2.  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 -Paternal grandfather had stomach cancer and paternal aunt had breast cancer.    PLAN:  1.  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  Will rule out nutritional deficiencies including methylmalonic acid, I37, copper, folic acid, ferritin and iron panel. -We will check for infectious etiology including hep C and HIV. -We will check intact PTH level, ESR and CRP. -She is on benazepril.  ACE inhibitors are linked to ESA resistance. -We will check for multiple myeloma with SPEP, immunofixation and free light chains. -If there is no clear etiology, will consider bone marrow biopsy. -She will come back in 2 weeks for follow-up.    All questions were answered. The patient knows to call the clinic with any problems, questions or concerns.   Derek Jack, MD 10/12/20 2:42 PM  [facility] [phone]   I, Milinda Antis, am acting as a scribe for Dr. Sanda Linger.  I, Derek Jack MD, have reviewed the above documentation for accuracy and completeness, and I agree with the above.

## 2020-10-12 NOTE — Patient Instructions (Signed)
[place] at [facility] Discharge Instructions  You were seen today by Dr. Delton Coombes for your problems with anemia. He went over your recent results. You had labs drawn today for further analysis of your anemia. Dr. Delton Coombes will see you back in 2 weeks for labs and follow up.   Thank you for choosing [place] at [facility] to provide your oncology and hematology care.  To afford each patient quality time with our provider, please arrive at least 15 minutes before your scheduled appointment time.   If you have a lab appointment with the [facility] please come in thru the Main Entrance and check in at the main information desk  You need to re-schedule your appointment should you arrive 10 or more minutes late.  We strive to give you quality time with our providers, and arriving late affects you and other patients whose appointments are after yours.  Also, if you no show three or more times for appointments you may be dismissed from the clinic at the providers discretion.     Again, thank you for choosing [facility].  Our hope is that these requests will decrease the amount of time that you wait before being seen by our physicians.       _____________________________________________________________  Should you have questions after your visit to [facility], please contact our office at [phone] between the hours of 8:00 a.m. and 4:30 p.m.  Voicemails left after 4:00 p.m. will not be returned until the following business day.  For prescription refill requests, have your pharmacy contact our office and allow 72 hours.    Cancer Center Support Programs:   > Cancer Support Group  2nd Tuesday of the month 1pm-2pm, Journey Room

## 2020-10-13 LAB — KAPPA/LAMBDA LIGHT CHAINS
Kappa free light chain: 217.8 mg/L — ABNORMAL HIGH (ref 3.3–19.4)
Kappa, lambda light chain ratio: 0.82 (ref 0.26–1.65)
Lambda free light chains: 264.5 mg/L — ABNORMAL HIGH (ref 5.7–26.3)

## 2020-10-13 LAB — PTH, INTACT AND CALCIUM
Calcium, Total (PTH): 7.5 mg/dL — ABNORMAL LOW (ref 8.7–10.3)
PTH: 211 pg/mL — ABNORMAL HIGH (ref 15–65)

## 2020-10-14 LAB — PROTEIN ELECTROPHORESIS, SERUM
A/G Ratio: 0.7 (ref 0.7–1.7)
Albumin ELP: 2.6 g/dL — ABNORMAL LOW (ref 2.9–4.4)
Alpha-1-Globulin: 0.3 g/dL (ref 0.0–0.4)
Alpha-2-Globulin: 0.9 g/dL (ref 0.4–1.0)
Beta Globulin: 0.8 g/dL (ref 0.7–1.3)
Gamma Globulin: 1.5 g/dL (ref 0.4–1.8)
Globulin, Total: 3.5 g/dL (ref 2.2–3.9)
M-Spike, %: 0.1 g/dL — ABNORMAL HIGH
Total Protein ELP: 6.1 g/dL (ref 6.0–8.5)

## 2020-10-14 LAB — COPPER, SERUM: Copper: 122 ug/dL (ref 80–158)

## 2020-10-14 LAB — IMMUNOFIXATION ELECTROPHORESIS
IgA: 291 mg/dL (ref 64–422)
IgG (Immunoglobin G), Serum: 1572 mg/dL (ref 586–1602)
IgM (Immunoglobulin M), Srm: 146 mg/dL (ref 26–217)
Total Protein ELP: 6 g/dL (ref 6.0–8.5)

## 2020-10-15 LAB — METHYLMALONIC ACID, SERUM: Methylmalonic Acid, Quantitative: 476 nmol/L — ABNORMAL HIGH (ref 0–378)

## 2020-10-26 DIAGNOSIS — N189 Chronic kidney disease, unspecified: Secondary | ICD-10-CM | POA: Diagnosis not present

## 2020-10-26 LAB — OCCULT BLOOD X 1 CARD TO LAB, STOOL
Fecal Occult Bld: NEGATIVE
Fecal Occult Bld: NEGATIVE
Fecal Occult Bld: NEGATIVE

## 2020-10-28 ENCOUNTER — Inpatient Hospital Stay (HOSPITAL_COMMUNITY): Payer: Medicare PPO | Admitting: Hematology

## 2020-10-28 ENCOUNTER — Other Ambulatory Visit: Payer: Self-pay

## 2020-10-28 VITALS — BP 148/61 | HR 68 | Temp 97.3°F | Resp 16 | Wt 125.6 lb

## 2020-10-28 DIAGNOSIS — N189 Chronic kidney disease, unspecified: Secondary | ICD-10-CM | POA: Diagnosis not present

## 2020-10-28 DIAGNOSIS — Z992 Dependence on renal dialysis: Secondary | ICD-10-CM

## 2020-10-28 DIAGNOSIS — D649 Anemia, unspecified: Secondary | ICD-10-CM | POA: Diagnosis not present

## 2020-10-28 DIAGNOSIS — N186 End stage renal disease: Secondary | ICD-10-CM | POA: Diagnosis not present

## 2020-10-28 DIAGNOSIS — D631 Anemia in chronic kidney disease: Secondary | ICD-10-CM

## 2020-10-28 MED ORDER — CYANOCOBALAMIN 1000 MCG/ML IJ SOLN
1000.0000 ug | Freq: Once | INTRAMUSCULAR | Status: AC
  Administered 2020-10-28: 1000 ug via INTRAMUSCULAR
  Filled 2020-10-28: qty 1

## 2020-10-28 NOTE — Progress Notes (Signed)
Catherine Burke presents today for office visit and  injection per the provider's orders.  Vitamin B-12 administration to Left Deltoid,  without incident; injection site WNL; see MAR for injection details.  Patient tolerated procedure well and without incident.  No questions or complaints noted at this time. Patient discharged ambulatory and in stable condition.

## 2020-10-28 NOTE — Patient Instructions (Addendum)
[place] at [facility] Discharge Instructions  You were seen today by Dr. Delton Coombes. He went over your recent results. You received your vitamin B12 injection today; start taking Dialyvite 5000 1 tablet daily. Dr. Delton Coombes will see you back in 4 weeks for labs and follow up.   Thank you for choosing [place] at [facility] to provide your oncology and hematology care.  To afford each patient quality time with our provider, please arrive at least 15 minutes before your scheduled appointment time.   If you have a lab appointment with the [facility] please come in thru the Main Entrance and check in at the main information desk  You need to re-schedule your appointment should you arrive 10 or more minutes late.  We strive to give you quality time with our providers, and arriving late affects you and other patients whose appointments are after yours.  Also, if you no show three or more times for appointments you may be dismissed from the clinic at the providers discretion.     Again, thank you for choosing [facility].  Our hope is that these requests will decrease the amount of time that you wait before being seen by our physicians.       _____________________________________________________________  Should you have questions after your visit to [facility], please contact our office at [phone] between the hours of 8:00 a.m. and 4:30 p.m.  Voicemails left after 4:00 p.m. will not be returned until the following business day.  For prescription refill requests, have your pharmacy contact our office and allow 72 hours.    Cancer Center Support Programs:   > Cancer Support Group  2nd Tuesday of the month 1pm-2pm, Journey Room

## 2020-10-28 NOTE — Progress Notes (Addendum)
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, a 80 y.o. female, returns for routine follow-up for her anemia due to chronic kidney disease. Catherine Burke was last seen on 10/12/2020.  Today she reports feeling well. She received her EPO injection on 02/23 and is getting them every 2 weeks. Her energy is improving and she is doing her chores and activities at home and has started driving again. Her appetite is excellent and she denies having leg swelling.   REVIEW OF SYSTEMS:  Review of Systems  Constitutional: Positive for fatigue (90%). Negative for appetite change.  Cardiovascular: Negative for leg swelling.  All other systems reviewed and are negative.   PAST MEDICAL/SURGICAL HISTORY:  Past Medical History:  Diagnosis Date   Anemia    Arthritis    Diabetes mellitus    GERD (gastroesophageal reflux disease)    HLD (hyperlipidemia)    Hypercholesterolemia    Hypertension    Kidney stones    Osteoporosis    Proteinuria    Past Surgical History:  Procedure Laterality Date   ABDOMINAL HYSTERECTOMY     BACK SURGERY     lumbar   BREAST CYST ASPIRATION     unsure of laterality    CAPD INSERTION N/A 02/13/2018   Procedure: LAPAROSCOPIC INSERTION CONTINUOUS AMBULATORY PERITONEAL DIALYSIS  (CAPD) CATHETER;  Surgeon: Algernon Huxley, MD;  Location: [facility] ORS;  Service: Vascular;  Laterality: N/A;   CHOLECYSTECTOMY     EYE SURGERY     bilateral cataract    NASAL SEPTUM SURGERY      SOCIAL HISTORY:  Social History   Socioeconomic History   Marital status: Married    Spouse name: Not on file   Number of  children: 3   Years of education: Not on file   Highest education level: Not on file  Occupational History   Occupation: retired  Tobacco Use   Smoking status: Never Smoker   Smokeless tobacco: Never Used  Scientific laboratory technician Use: Never used  Substance and Sexual Activity   Alcohol use: No    Alcohol/week: 0.0 standard drinks   Drug use: No   Sexual activity: Yes  Other Topics Concern   Not on file  Social History Narrative   Not on file   Social Determinants of Health   Financial Resource Strain: Low Risk    Difficulty of Paying Living Expenses: Not hard at all  Food Insecurity: No Food Insecurity   Worried About Charity fundraiser in the Last Year: Never true   Arboriculturist in the Last Year: Never true  Transportation Needs: No Transportation Needs   Lack of Transportation (Medical): No   Lack of Transportation (Non-Medical): No  Physical Activity: Sufficiently Active   Days of Exercise per Week: 5 days   Minutes of Exercise per Session: 30 min  Stress: No Stress Concern Present   Feeling of Stress : Not at all  Social Connections: Socially Integrated   Frequency of Communication with Friends and Family: More than three times a week   Frequency of Social Gatherings with Friends and Family:  More than three times a week   Attends Religious Services: More than 4 times per year   Active Member of Clubs or Organizations: Yes   Attends Music therapist: More than 4 times per year   Marital Status: Married  Human resources officer Violence: Not At Risk   Fear of Current or Ex-Partner: No   Emotionally Abused: No   Physically Abused: No   Sexually Abused: No    FAMILY HISTORY:  Family History  Problem Relation Age of Onset   Other Mother    Heart attack Father    Breast cancer Paternal Aunt     CURRENT MEDICATIONS:  Current Outpatient Medications  Medication Sig Dispense Refill   ACCU-CHEK AVIVA PLUS test strip TESTING  TWICE DAILY. 100 each 11   Acetaminophen (TYLENOL) 325 MG CAPS Take 1 capsule by mouth daily as needed (pain).     atorvastatin (LIPITOR) 10 MG tablet Take 10 mg by mouth daily.     B Complex-C-Biotin-E-Min-FA (DIALYVITE 5000) 5 MG TABS Take 1 tablet by mouth daily.     benazepril (LOTENSIN) 20 MG tablet Take 20 mg by mouth 2 (two) times daily.     carvedilol (COREG) 3.125 MG tablet Take 3.125 mg by mouth 2 (two) times daily.     diclofenac Sodium (VOLTAREN) 1 % GEL Apply 2 g topically daily as needed (pain).     Ferrous Sulfate 134 MG TABS Take 1 tablet (134 mg total) by mouth daily. 30 tablet 1   Insulin Glargine (LANTUS SOLOSTAR) 100 UNIT/ML Solostar Pen Inject 20 Units into the skin at bedtime.      Insulin Pen Needle 31G X 5 MM MISC As directed 100 each 5   isosorbide mononitrate (IMDUR) 30 MG 24 hr tablet Take 30 mg by mouth daily.     Magnesium Oxide (MAGNESIUM OXIDE 400) 240 MG PACK Take 1 tablet by mouth daily.     mirtazapine (REMERON) 7.5 MG tablet Take 7.5 mg by mouth at bedtime.     omeprazole (PRILOSEC) 40 MG capsule Take 40 mg by mouth daily.     potassium chloride (KLOR-CON) 10 MEQ tablet Take 10 mEq by mouth daily.     senna (SENOKOT) 8.6 MG tablet Take 2 tablets by mouth daily.     torsemide (DEMADEX) 20 MG tablet Take 20 mg by mouth daily as needed (fluid).     hydrALAZINE (APRESOLINE) 100 MG tablet Take 1 tablet (100 mg total) by mouth every 8 (eight) hours. 90 tablet 1   spironolactone (ALDACTONE) 25 MG tablet Take 1 tablet (25 mg total) by mouth daily. 30 tablet 1   Current Facility-Administered Medications  Medication Dose Route Frequency Provider Last Rate Last Admin   cyanocobalamin ((VITAMIN B-12)) injection 1,000 mcg  1,000 mcg Intramuscular Once Derek Jack, MD        ALLERGIES:  Allergies  Allergen Reactions   Levofloxacin Other (See Comments)    Throat Swelling    Penicillins Anaphylaxis   Other    Penicillin G Other (See  Comments)   Sulfa Antibiotics     PHYSICAL EXAM:  Performance status (ECOG): 1 - Symptomatic but completely ambulatory  Vitals:   10/28/20 1156  BP: (!) 148/61  Pulse: 68  Resp: 16  Temp: (!) 97.3 F (36.3 C)  SpO2: 100%   Wt Readings from Last 3 Encounters:  10/28/20 125 lb 9.6 oz (57 kg)  10/12/20 128 lb (58.1 kg)  09/08/20 127 lb (57.6 kg)   Physical Exam  Vitals reviewed.  Constitutional:      Appearance: Normal appearance.  Cardiovascular:     Rate and Rhythm: Normal rate and regular rhythm.     Pulses: Normal pulses.     Heart sounds: Normal heart sounds.  Pulmonary:     Effort: Pulmonary effort is normal.     Breath sounds: Normal breath sounds.  Abdominal:     Comments: Peritoneal dialysis catheter  Musculoskeletal:     Right lower leg: Edema (trace) present.     Left lower leg: Edema (trace) present.  Neurological:     General: No focal deficit present.     Mental Status: She is alert and oriented to person, place, and time.  Psychiatric:        Mood and Affect: Mood normal.        Behavior: Behavior normal.     LABORATORY DATA:  I have reviewed the labs as listed.  CBC Latest Ref Rng & Units 10/12/2020 09/09/2020 09/08/2020  WBC 4.0 - 10.5 K/uL 3.7(L) 6.0 6.5  Hemoglobin 12.0 - 15.0 g/dL 8.5(L) 9.8(L) 6.8(LL)  Hematocrit 36.0 - 46.0 % 27.7(L) 31.3(L) 22.5(L)  Platelets 150 - 400 K/uL 255 262 282   CMP Latest Ref Rng & Units 10/12/2020 09/09/2020 09/08/2020  Glucose 70 - 99 mg/dL - 208(H) 157(H)  BUN 8 - 23 mg/dL - 64(H) 59(H)  Creatinine 0.44 - 1.00 mg/dL - 5.95(H) 5.91(H)  Sodium 135 - 145 mmol/L - 138 135  Potassium 3.5 - 5.1 mmol/L - 3.7 3.4(L)  Chloride 98 - 111 mmol/L - 100 99  CO2 22 - 32 mmol/L - 23 21(L)  Calcium 8.7 - 10.3 mg/dL 7.5(L) 7.7(L) 7.2(L)  Total Protein 6.5 - 8.1 g/dL - - 6.4(L)  Total Bilirubin 0.3 - 1.2 mg/dL - - 0.4  Alkaline Phos 38 - 126 U/L - - 65  AST 15 - 41 U/L - - 18  ALT 0 - 44 U/L - - 14   Lab Results  Component Value  Date   LDH 254 (H) 10/12/2020   Lab Results  Component Value Date   TIBC 218 (L) 10/12/2020   TIBC 131 (L) 09/08/2020   TIBC 226 (L) 07/21/2020   FERRITIN 1,186 (H) 10/12/2020   FERRITIN 1,821 (H) 09/08/2020   FERRITIN 697 (H) 07/21/2020   IRONPCTSAT 11 10/12/2020   IRONPCTSAT 8 (L) 09/08/2020   IRONPCTSAT 9 (L) 07/21/2020      Component Value Date/Time   RBC 3.02 (L) 10/12/2020 1535   RBC 3.03 (L) 10/12/2020 1535   MCV 91.4 10/12/2020 1535   MCH 28.1 10/12/2020 1535   MCHC 30.7 10/12/2020 1535   RDW 18.2 (H) 10/12/2020 1535   LYMPHSABS 0.7 10/12/2020 1535   MONOABS 0.2 10/12/2020 1535   EOSABS 0.2 10/12/2020 1535   BASOSABS 0.0 10/12/2020 1535   Lab Results  Component Value Date   TOTALPROTELP 6.1 10/12/2020   TOTALPROTELP 6.0 10/12/2020   ALBUMINELP 2.6 (L) 10/12/2020   A1GS 0.3 10/12/2020   A2GS 0.9 10/12/2020   BETS 0.8 10/12/2020   GAMS 1.5 10/12/2020   MSPIKE 0.1 (H) 10/12/2020   SPEI Comment 10/12/2020    Lab Results  Component Value Date   KPAFRELGTCHN 217.8 (H) 10/12/2020   LAMBDASER 264.5 (H) 10/12/2020   KAPLAMBRATIO 0.82 10/12/2020    DIAGNOSTIC IMAGING:  I have independently reviewed the scans and discussed with the patient. No results found.   ASSESSMENT:  1.  Normocytic anemia: -Referred by Dr. Juleen China for normocytic anemia, with  decreased responsiveness to ESA. 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 -She has been receiving 36,000 units of Epogen every 2 weeks. -Denies any bleeding per rectum or melena. -Received 2 units of PRBC in January, last transfusion on 09/08/2020.  Total 4 transfusions in the last 1 year.  2.  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 -Paternal grandfather had stomach cancer and paternal aunt had breast cancer.   PLAN:  1.  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 -CRP is  elevated at 5.3.  LDH also elevated at 254. -Vitamin B12 level was normal but methylmalonic acid was high at 476.  Stool for occult blood was negative x3. -Recommend B12 loading injection today.  She started on Dialyvite by Dr. Juleen China which she will start taking tomorrow.  It has 2 mg of vitamin B12 in it. -She has received her last infusion yesterday. -I plan to repeat her blood work in 4 weeks.  If there is no improvement, we will proceed with bone marrow biopsy.  2.  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  Immunofixation was negative.  LDH was 254. -Kappa light chains are 217 and lambda light chains 264 with ratio of 0.82.  We will closely monitor.   Orders placed this encounter:  Orders Placed This Encounter  Procedures   Occult blood x 1 card to lab, stool   Occult blood x 1 card to lab, stool   Occult blood x 1 card to lab, stool   CBC with Differential/Platelet   Lactate dehydrogenase   Reticulocytes   Methylmalonic acid, serum     Derek Jack, MD [place] [phone]   I, Milinda Antis, am acting as a scribe for Dr. Sanda Linger.  I, Derek Jack MD, have reviewed the above documentation for accuracy and completeness, and I agree with the above.

## 2020-10-28 NOTE — Progress Notes (Signed)
Patient was assessed by Dr. Delton Coombes and labs have been reviewed.  Patient is okay to proceed with B12 injection today. Primary nurse and pharmacy aware.

## 2020-11-29 ENCOUNTER — Other Ambulatory Visit: Payer: Self-pay

## 2020-11-29 ENCOUNTER — Inpatient Hospital Stay (HOSPITAL_COMMUNITY): Payer: Medicare PPO | Attending: Hematology | Admitting: Hematology

## 2020-11-29 ENCOUNTER — Inpatient Hospital Stay (HOSPITAL_COMMUNITY): Payer: Medicare PPO

## 2020-11-29 VITALS — BP 167/59 | HR 16 | Temp 97.0°F | Resp 16 | Wt 130.8 lb

## 2020-11-29 DIAGNOSIS — Z882 Allergy status to sulfonamides status: Secondary | ICD-10-CM | POA: Diagnosis not present

## 2020-11-29 DIAGNOSIS — D472 Monoclonal gammopathy: Secondary | ICD-10-CM | POA: Insufficient documentation

## 2020-11-29 DIAGNOSIS — Z992 Dependence on renal dialysis: Secondary | ICD-10-CM | POA: Insufficient documentation

## 2020-11-29 DIAGNOSIS — D631 Anemia in chronic kidney disease: Secondary | ICD-10-CM | POA: Insufficient documentation

## 2020-11-29 DIAGNOSIS — Z880 Allergy status to penicillin: Secondary | ICD-10-CM | POA: Insufficient documentation

## 2020-11-29 DIAGNOSIS — Z803 Family history of malignant neoplasm of breast: Secondary | ICD-10-CM | POA: Diagnosis not present

## 2020-11-29 DIAGNOSIS — Z87442 Personal history of urinary calculi: Secondary | ICD-10-CM | POA: Insufficient documentation

## 2020-11-29 DIAGNOSIS — Z79899 Other long term (current) drug therapy: Secondary | ICD-10-CM | POA: Insufficient documentation

## 2020-11-29 DIAGNOSIS — Z881 Allergy status to other antibiotic agents status: Secondary | ICD-10-CM | POA: Insufficient documentation

## 2020-11-29 DIAGNOSIS — M7989 Other specified soft tissue disorders: Secondary | ICD-10-CM | POA: Diagnosis not present

## 2020-11-29 DIAGNOSIS — R5383 Other fatigue: Secondary | ICD-10-CM | POA: Insufficient documentation

## 2020-11-29 DIAGNOSIS — Z9049 Acquired absence of other specified parts of digestive tract: Secondary | ICD-10-CM | POA: Diagnosis not present

## 2020-11-29 DIAGNOSIS — Z8249 Family history of ischemic heart disease and other diseases of the circulatory system: Secondary | ICD-10-CM | POA: Diagnosis not present

## 2020-11-29 DIAGNOSIS — D649 Anemia, unspecified: Secondary | ICD-10-CM

## 2020-11-29 DIAGNOSIS — N186 End stage renal disease: Secondary | ICD-10-CM | POA: Diagnosis not present

## 2020-11-29 LAB — CBC WITH DIFFERENTIAL/PLATELET
Abs Immature Granulocytes: 0.06 10*3/uL (ref 0.00–0.07)
Basophils Absolute: 0.1 10*3/uL (ref 0.0–0.1)
Basophils Relative: 1 %
Eosinophils Absolute: 0.1 10*3/uL (ref 0.0–0.5)
Eosinophils Relative: 3 %
HCT: 36.1 % (ref 36.0–46.0)
Hemoglobin: 10.8 g/dL — ABNORMAL LOW (ref 12.0–15.0)
Immature Granulocytes: 1 %
Lymphocytes Relative: 13 %
Lymphs Abs: 0.6 10*3/uL — ABNORMAL LOW (ref 0.7–4.0)
MCH: 29.2 pg (ref 26.0–34.0)
MCHC: 29.9 g/dL — ABNORMAL LOW (ref 30.0–36.0)
MCV: 97.6 fL (ref 80.0–100.0)
Monocytes Absolute: 0.3 10*3/uL (ref 0.1–1.0)
Monocytes Relative: 6 %
Neutro Abs: 3.7 10*3/uL (ref 1.7–7.7)
Neutrophils Relative %: 76 %
Platelets: 225 10*3/uL (ref 150–400)
RBC: 3.7 MIL/uL — ABNORMAL LOW (ref 3.87–5.11)
RDW: 17.2 % — ABNORMAL HIGH (ref 11.5–15.5)
WBC: 4.8 10*3/uL (ref 4.0–10.5)
nRBC: 0 % (ref 0.0–0.2)

## 2020-11-29 LAB — RETICULOCYTES
Immature Retic Fract: 21.6 % — ABNORMAL HIGH (ref 2.3–15.9)
RBC.: 3.71 MIL/uL — ABNORMAL LOW (ref 3.87–5.11)
Retic Count, Absolute: 94.6 10*3/uL (ref 19.0–186.0)
Retic Ct Pct: 2.6 % (ref 0.4–3.1)

## 2020-11-29 LAB — LACTATE DEHYDROGENASE: LDH: 158 U/L (ref 98–192)

## 2020-11-29 NOTE — Progress Notes (Signed)
[address], [place] [address]   CLINIC:  Medical Oncology/Hematology  PCP:  Idelle Crouch, MD [place] / [place] [phone]  REASON FOR VISIT:  Follow-up for anemia due to CKD  PRIOR THERAPY: PRBC x 1 unit on 09/08/2020  CURRENT THERAPY: Dialyvite and iron tablet daily  INTERVAL HISTORY:  Catherine Burke, a 80 y.o. female, returns for routine follow-up for her anemia due to CKD. Catherine Burke was last seen on 10/28/2020.  Today she is accompanied by her son and she reports feeling well. She is taking Dialyvite and iron tablets daily. She notes having leg swelling but denies having numbness or tingling in hands or feet, hematuria, melena, hematochezia or abdominal pain.   REVIEW OF SYSTEMS:  Review of Systems  Constitutional: Positive for fatigue (75%). Negative for appetite change.  Cardiovascular: Positive for leg swelling.  Gastrointestinal: Negative for abdominal pain and blood in stool.  Genitourinary: Negative for hematuria.   Neurological: Negative for numbness.  All other systems reviewed and are negative.   PAST MEDICAL/SURGICAL HISTORY:  Past Medical History:  Diagnosis Date  . Anemia   . Arthritis   . Diabetes mellitus   . GERD (gastroesophageal reflux disease)   . HLD (hyperlipidemia)   . Hypercholesterolemia   . Hypertension   . Kidney stones   . Osteoporosis   . Proteinuria    Past Surgical History:  Procedure Laterality Date  . ABDOMINAL HYSTERECTOMY    . BACK SURGERY     lumbar  . BREAST CYST ASPIRATION     unsure of laterality   . CAPD INSERTION N/A 02/13/2018   Procedure: LAPAROSCOPIC INSERTION CONTINUOUS AMBULATORY PERITONEAL DIALYSIS  (CAPD) CATHETER;  Surgeon: Algernon Huxley, MD;  Location: [facility] ORS;  Service: Vascular;  Laterality: N/A;  . CHOLECYSTECTOMY    . EYE SURGERY     bilateral cataract   . NASAL SEPTUM SURGERY      SOCIAL HISTORY:  Social History    Socioeconomic History  . Marital status: Married    Spouse name: Not on file  . Number of children: 3  . Years of education: Not on file  . Highest education level: Not on file  Occupational History  . Occupation: retired  Tobacco Use  . Smoking status: Never Smoker  . Smokeless tobacco: Never Used  Vaping Use  . Vaping Use: Never used  Substance and Sexual Activity  . Alcohol use: No    Alcohol/week: 0.0 standard drinks  . Drug use: No  . Sexual activity: Yes  Other Topics Concern  . Not on file  Social History Narrative  . Not on file   Social Determinants of Health   Financial Resource Strain: Low Risk   . Difficulty of Paying Living Expenses: Not hard at all  Food Insecurity: No Food Insecurity  . Worried About Charity fundraiser in the Last Year: Never true  . Ran Out of Food in the Last Year: Never true  Transportation Needs: No Transportation Needs  . Lack of Transportation (Medical): No  . Lack of Transportation (Non-Medical): No  Physical Activity: Sufficiently Active  . Days of Exercise per Week: 5 days  . Minutes of Exercise per Session: 30 min  Stress: No Stress Concern Present  . Feeling of Stress : Not at all  Social Connections: Socially Integrated  . Frequency of Communication with Friends and Family: More than three times a  week  . Frequency of Social Gatherings with Friends and Family: More than three times a week  . Attends Religious Services: More than 4 times per year  . Active Member of Clubs or Organizations: Yes  . Attends Archivist Meetings: More than 4 times per year  . Marital Status: Married  Human resources officer Violence: Not At Risk  . Fear of Current or Ex-Partner: No  . Emotionally Abused: No  . Physically Abused: No  . Sexually Abused: No    FAMILY HISTORY:  Family History  Problem Relation Age of Onset  . Other Mother   . Heart attack Father   . Breast cancer Paternal Aunt     CURRENT MEDICATIONS:  Current  Outpatient Medications  Medication Sig Dispense Refill  . ACCU-CHEK AVIVA PLUS test strip TESTING TWICE DAILY. 100 each 11  . Acetaminophen (TYLENOL) 325 MG CAPS Take 1 capsule by mouth daily as needed (pain).    Marland Kitchen atorvastatin (LIPITOR) 10 MG tablet Take 10 mg by mouth daily.    . B Complex-C-Biotin-E-Min-FA (DIALYVITE 5000) 5 MG TABS Take 1 tablet by mouth daily.    . benazepril (LOTENSIN) 20 MG tablet Take 20 mg by mouth 2 (two) times daily.    . carvedilol (COREG) 3.125 MG tablet Take 3.125 mg by mouth 2 (two) times daily.    . cloNIDine (CATAPRES) 0.1 MG tablet Take by mouth.    . diclofenac Sodium (VOLTAREN) 1 % GEL Apply 2 g topically daily as needed (pain).    Marland Kitchen doxazosin (CARDURA) 2 MG tablet Take 2 mg by mouth at bedtime.    . Ferrous Sulfate 134 MG TABS Take 1 tablet (134 mg total) by mouth daily. 30 tablet 1  . Insulin Glargine (LANTUS SOLOSTAR) 100 UNIT/ML Solostar Pen Inject 20 Units into the skin at bedtime.     . Insulin Pen Needle 31G X 5 MM MISC As directed 100 each 5  . isosorbide mononitrate (IMDUR) 30 MG 24 hr tablet Take 30 mg by mouth daily.    . magnesium oxide (MAG-OX) 400 MG tablet Take 1 tablet by mouth daily.    . mirtazapine (REMERON) 7.5 MG tablet Take 7.5 mg by mouth at bedtime.    Marland Kitchen omeprazole (PRILOSEC) 40 MG capsule Take 40 mg by mouth daily.    . potassium chloride (KLOR-CON) 10 MEQ tablet Take 10 mEq by mouth daily.    Marland Kitchen senna (SENOKOT) 8.6 MG tablet Take 2 tablets by mouth daily.    Marland Kitchen torsemide (DEMADEX) 20 MG tablet Take 20 mg by mouth daily as needed (fluid).    . hydrALAZINE (APRESOLINE) 100 MG tablet Take 1 tablet (100 mg total) by mouth every 8 (eight) hours. 90 tablet 1  . spironolactone (ALDACTONE) 25 MG tablet Take 1 tablet (25 mg total) by mouth daily. 30 tablet 1   No current facility-administered medications for this visit.    ALLERGIES:  Allergies  Allergen Reactions  . Levofloxacin Other (See Comments)    Throat Swelling   .  Penicillins Anaphylaxis  . Other   . Penicillin G Other (See Comments)  . Sulfa Antibiotics     PHYSICAL EXAM:  Performance status (ECOG): 1 - Symptomatic but completely ambulatory  Vitals:   11/29/20 1114  BP: (!) 167/59  Pulse: (!) 16  Resp: 16  Temp: (!) 97 F (36.1 C)  SpO2: 98%   Wt Readings from Last 3 Encounters:  11/29/20 130 lb 12.8 oz (59.3 kg)  10/28/20 125 lb  9.6 oz (57 kg)  10/12/20 128 lb (58.1 kg)   Physical Exam Vitals reviewed.  Constitutional:      Appearance: Normal appearance.  Cardiovascular:     Rate and Rhythm: Normal rate and regular rhythm.     Pulses: Normal pulses.     Heart sounds: Normal heart sounds.  Pulmonary:     Effort: Pulmonary effort is normal.     Breath sounds: Normal breath sounds.  Abdominal:     Palpations: Abdomen is soft. There is no mass.     Tenderness: There is no abdominal tenderness.     Comments: Peritoneal dialysis catheter  Musculoskeletal:     Right lower leg: Edema (1+) present.     Left lower leg: Edema (1+) present.  Neurological:     General: No focal deficit present.     Mental Status: She is alert and oriented to person, place, and time.  Psychiatric:        Mood and Affect: Mood normal.        Behavior: Behavior normal.     LABORATORY DATA:  I have reviewed the labs as listed.  CBC Latest Ref Rng & Units 11/29/2020 10/12/2020 09/09/2020  WBC 4.0 - 10.5 K/uL 4.8 3.7(L) 6.0  Hemoglobin 12.0 - 15.0 g/dL 10.8(L) 8.5(L) 9.8(L)  Hematocrit 36.0 - 46.0 % 36.1 27.7(L) 31.3(L)  Platelets 150 - 400 K/uL 225 255 262   CMP Latest Ref Rng & Units 10/12/2020 09/09/2020 09/08/2020  Glucose 70 - 99 mg/dL - 208(H) 157(H)  BUN 8 - 23 mg/dL - 64(H) 59(H)  Creatinine 0.44 - 1.00 mg/dL - 5.95(H) 5.91(H)  Sodium 135 - 145 mmol/L - 138 135  Potassium 3.5 - 5.1 mmol/L - 3.7 3.4(L)  Chloride 98 - 111 mmol/L - 100 99  CO2 22 - 32 mmol/L - 23 21(L)  Calcium 8.7 - 10.3 mg/dL 7.5(L) 7.7(L) 7.2(L)  Total Protein 6.5 - 8.1 g/dL - -  6.4(L)  Total Bilirubin 0.3 - 1.2 mg/dL - - 0.4  Alkaline Phos 38 - 126 U/L - - 65  AST 15 - 41 U/L - - 18  ALT 0 - 44 U/L - - 14      Component Value Date/Time   RBC 3.70 (L) 11/29/2020 0936   RBC 3.71 (L) 11/29/2020 0936   MCV 97.6 11/29/2020 0936   MCH 29.2 11/29/2020 0936   MCHC 29.9 (L) 11/29/2020 0936   RDW 17.2 (H) 11/29/2020 0936   LYMPHSABS 0.6 (L) 11/29/2020 0936   MONOABS 0.3 11/29/2020 0936   EOSABS 0.1 11/29/2020 0936   BASOSABS 0.1 11/29/2020 0936   Lab Results  Component Value Date   LDH 158 11/29/2020   LDH 254 (H) 10/12/2020    DIAGNOSTIC IMAGING:  I have independently reviewed the scans and discussed with the patient. No results found.   ASSESSMENT:  1.Normocytic anemia: -Referred by Dr. Juleen China for normocytic anemia, with decreased responsiveness to ESA. 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 -She has been receiving 36,000 units of Epogen every 2 weeks. -Denies any bleeding per rectum or melena. -Received 2 units of PRBC in January, last transfusion on 09/08/2020. Total 4 transfusions in the last 1 year.  2. 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 -Paternal grandfather had stomach cancer and paternal aunt had breast cancer.   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  She is  on Dialyvite prescribed by Dr. Juleen China which has 2 mg of B12 in it. -We reviewed labs from today which showed normal LDH of 158.  Reticulocyte count was 2.6%.  Hemoglobin improved to 10.8.  Methylmalonic acid is pending. -Stool for occult blood was negative.  Her hemoglobin has improved from 8.5 on 10/12/2020. -She will continue B12 at this time.  We plan to see her back in 3 months with repeat labs.  2.  Monoclonal gammopathy: -She had M spike of 0.1 g with negative immunofixation.  LDH has  normalized. -Kappa light chains are 217, lambda light chains 264 with ratio of 0.82. -We will repeat SPEP at next visit.  Orders placed this encounter:  Orders Placed This Encounter  Procedures  . CBC with Differential/Platelet  . Methylmalonic acid, serum  . Lactate dehydrogenase  . Ferritin  . Iron and TIBC  . Protein electrophoresis, serum     Derek Jack, MD [place] [phone]   I, Milinda Antis, am acting as a scribe for Dr. Sanda Linger.  I, Derek Jack MD, have reviewed the above documentation for accuracy and completeness, and I agree with the above.

## 2020-11-29 NOTE — Patient Instructions (Signed)
[facility] at [facility] Discharge Instructions  You were seen today by Dr. Katragadda. He went over your recent results. Dr. Katragadda will see you back in 3 months for labs and follow up.   Thank you for choosing [facility] at [facility] to provide your oncology and hematology care.  To afford each patient quality time with our provider, please arrive at least 15 minutes before your scheduled appointment time.   If you have a lab appointment with the Cancer Center please come in thru the Main Entrance and check in at the main information desk  You need to re-schedule your appointment should you arrive 10 or more minutes late.  We strive to give you quality time with our providers, and arriving late affects you and other patients whose appointments are after yours.  Also, if you no show three or more times for appointments you may be dismissed from the clinic at the providers discretion.     Again, thank you for choosing [facility].  Our hope is that these requests will decrease the amount of time that you wait before being seen by our physicians.       _____________________________________________________________  Should you have questions after your visit to  Cancer Center, please contact our office at [phone] between the hours of 8:00 a.m. and 4:30 p.m.  Voicemails left after 4:00 p.m. will not be returned until the following business day.  For prescription refill requests, have your pharmacy contact our office and allow 72 hours.    Cancer Center Support Programs:   > Cancer Support Group  2nd Tuesday of the month 1pm-2pm, Journey Room   

## 2020-12-03 LAB — METHYLMALONIC ACID, SERUM: Methylmalonic Acid, Quantitative: 487 nmol/L — ABNORMAL HIGH (ref 0–378)

## 2020-12-07 ENCOUNTER — Encounter: Payer: Self-pay | Admitting: Emergency Medicine

## 2020-12-07 ENCOUNTER — Ambulatory Visit
Admission: EM | Admit: 2020-12-07 | Discharge: 2020-12-07 | Disposition: A | Discharge status: Home / Self Care | Payer: Medicare PPO | Attending: Internal Medicine | Admitting: Internal Medicine

## 2020-12-07 ENCOUNTER — Other Ambulatory Visit: Payer: Self-pay

## 2020-12-07 DIAGNOSIS — T148XXA Other injury of unspecified body region, initial encounter: Secondary | ICD-10-CM | POA: Diagnosis not present

## 2020-12-07 DIAGNOSIS — L03115 Cellulitis of right lower limb: Secondary | ICD-10-CM

## 2020-12-07 MED ORDER — DOXYCYCLINE HYCLATE 100 MG PO CAPS: 100.0000 mg | ORAL_CAPSULE | Freq: Two times a day (BID) | ORAL | 0 refills | Status: DC

## 2020-12-07 NOTE — Discharge Instructions (Addendum)
Try to fit in 2 doses of your antibiotic today, then take one before coming tomorrow to recheck your wound. The the mark on the side of your leg goes out of the marking to your thigh, then go to the ER because you will need IV antibiotics.

## 2020-12-07 NOTE — ED Provider Notes (Signed)
[facility]    CSN: [id number] Arrival date & time: 12/07/20  1335      History   Chief Complaint Chief Complaint  Patient presents with  . Animal Bite    HPI Catherine Burke is a 80 y.o. female who presents with R lower leg wound from her cat biting her one week ago. Has been applying peroxide, but is not getting better. Her cats is up to date with immunizations. Has been draining.     Past Medical History:  Diagnosis Date  . Anemia   . Arthritis   . Diabetes mellitus   . GERD (gastroesophageal reflux disease)   . HLD (hyperlipidemia)   . Hypercholesterolemia   . Hypertension   . Kidney stones   . Osteoporosis   . Proteinuria     Patient Active Problem List   Diagnosis Date Noted  . Symptomatic anemia 07/22/2020  . Acute on chronic anemia 07/21/2020  . ESRD (end stage renal disease) ([place]) 07/21/2020  . Peritoneal dialysis status ([place]) 07/21/2020  . Chronic kidney disease (CKD), stage IV (severe) ([place]) 02/08/2018  . Proteinuria 10/10/2017  . Chronic kidney disease (CKD), stage III (moderate) ([place]) 02/24/2015  . Kidney stones   . Osteoporosis   . GERD (gastroesophageal reflux disease)   . Diabetes mellitus ([place]) 01/17/2007  . Hyperlipidemia 01/17/2007  . Essential hypertension 01/17/2007  . OSTEOPENIA 01/17/2007  . HYSTERECTOMY, PARTIAL, HX OF 01/17/2007    Past Surgical History:  Procedure Laterality Date  . ABDOMINAL HYSTERECTOMY    . BACK SURGERY     lumbar  . BREAST CYST ASPIRATION     unsure of laterality   . CAPD INSERTION N/A 02/13/2018   Procedure: LAPAROSCOPIC INSERTION CONTINUOUS AMBULATORY PERITONEAL DIALYSIS  (CAPD) CATHETER;  Surgeon: Algernon Huxley, MD;  Location: [facility] ORS;  Service: Vascular;  Laterality: N/A;  . CHOLECYSTECTOMY    . EYE SURGERY     bilateral cataract   . NASAL SEPTUM SURGERY      OB History   No obstetric history on file.      Home Medications    Prior to Admission medications   Medication Sig Start  Date End Date Taking? Authorizing Provider  doxycycline (VIBRAMYCIN) 100 MG capsule Take 1 capsule (100 mg total) by mouth 2 (two) times daily. 12/07/20  Yes Rodriguez-Southworth, Sunday Spillers, PA-C  ACCU-CHEK AVIVA PLUS test strip TESTING TWICE DAILY. 08/11/14   Susy Frizzle, MD  Acetaminophen (TYLENOL) 325 MG CAPS Take 1 capsule by mouth daily as needed (pain).    [provider]  atorvastatin (LIPITOR) 10 MG tablet Take 10 mg by mouth daily. 07/28/20   [provider]  B Complex-C-Biotin-E-Min-FA (DIALYVITE 5000) 5 MG TABS Take 1 tablet by mouth daily. 10/27/20   [provider]  benazepril (LOTENSIN) 20 MG tablet Take 20 mg by mouth 2 (two) times daily. 08/06/18   [provider]  carvedilol (COREG) 3.125 MG tablet Take 3.125 mg by mouth 2 (two) times daily. 10/27/20   [provider]  cloNIDine (CATAPRES) 0.1 MG tablet Take by mouth. 07/19/20   [provider]  diclofenac Sodium (VOLTAREN) 1 % GEL Apply 2 g topically daily as needed (pain).    [provider]  doxazosin (CARDURA) 2 MG tablet Take 2 mg by mouth at bedtime. 11/23/20   [provider]  Ferrous Sulfate 134 MG TABS Take 1 tablet (134 mg total) by mouth daily. 09/09/20   Annita Brod, MD  hydrALAZINE (APRESOLINE)  100 MG tablet Take 1 tablet (100 mg total) by mouth every 8 (eight) hours. 10/13/17 07/21/20  Henreitta Leber, MD  Insulin Glargine (LANTUS SOLOSTAR) 100 UNIT/ML Solostar Pen Inject 20 Units into the skin at bedtime.  12/11/18   [provider]  Insulin Pen Needle 31G X 5 MM MISC As directed 03/31/15   Alycia Rossetti, MD  isosorbide mononitrate (IMDUR) 30 MG 24 hr tablet Take 30 mg by mouth daily. 08/12/20   [provider]  magnesium oxide (MAG-OX) 400 MG tablet Take 1 tablet by mouth daily. 10/13/20   [provider]  mirtazapine (REMERON) 7.5 MG tablet Take 7.5 mg by mouth at bedtime. 08/25/20   [provider]  omeprazole  (PRILOSEC) 40 MG capsule Take 40 mg by mouth daily.    [provider]  potassium chloride (KLOR-CON) 10 MEQ tablet Take 10 mEq by mouth daily. 03/11/20   [provider]  senna (SENOKOT) 8.6 MG tablet Take 2 tablets by mouth daily.    [provider]  spironolactone (ALDACTONE) 25 MG tablet Take 1 tablet (25 mg total) by mouth daily. 10/14/17 07/21/20  Henreitta Leber, MD  torsemide (DEMADEX) 20 MG tablet Take 20 mg by mouth daily as needed (fluid).    [provider]    Family History Family History  Problem Relation Age of Onset  . Other Mother   . Heart attack Father   . Breast cancer Paternal Aunt     Social History Social History   Tobacco Use  . Smoking status: Never Smoker  . Smokeless tobacco: Never Used  Vaping Use  . Vaping Use: Never used  Substance Use Topics  . Alcohol use: No    Alcohol/week: 0.0 standard drinks  . Drug use: No     Allergies   Levofloxacin, Penicillins, Other, Penicillin g, and Sulfa antibiotics   Review of Systems Review of Systems  Constitutional: Negative for activity change, appetite change, chills, diaphoresis, fatigue and fever.  Cardiovascular: Positive for leg swelling.  Musculoskeletal: Negative for gait problem, joint swelling and myalgias.  Skin: Positive for color change and wound. Negative for pallor and rash.  Neurological: Negative for weakness.  Hematological: Negative for adenopathy.  Psychiatric/Behavioral: Negative for confusion.     Physical Exam Triage Vital Signs ED Triage Vitals  Enc Vitals Group     BP 12/07/20 1347 131/67     Pulse Rate 12/07/20 1347 (!) 50     Resp 12/07/20 1347 18     Temp 12/07/20 1347 97.6 F (36.4 C)     Temp Source 12/07/20 1347 Oral     SpO2 12/07/20 1347 96 %     Weight --      Height --      Head Circumference --      Peak Flow --      Pain Score 12/07/20 1345 3     Pain Loc --      Pain Edu? --      Excl. in [place]? --    No data  found.  Updated Vital Signs BP 131/67 (BP Location: Right Arm)   Pulse (!) 50   Temp 97.6 F (36.4 C) (Oral)   Resp 18   SpO2 96%   Visual Acuity Right Eye Distance:   Left Eye Distance:   Bilateral Distance:    Right Eye Near:   Left Eye Near:    Bilateral Near:     Physical Exam Vitals and nursing note  reviewed.  Constitutional:      General: She is not in acute distress.    Appearance: She is normal weight. She is not toxic-appearing.  HENT:     Right Ear: External ear normal.     Left Ear: External ear normal.  Eyes:     General: No scleral icterus.    Conjunctiva/sclera: Conjunctivae normal.  Pulmonary:     Effort: Pulmonary effort is normal.  Musculoskeletal:        General: Normal range of motion.     Cervical back: Neck supple.     Comments: R LOWER LEG-  With flap wound which is draining yellow matter. Has erythema and warmth around it, with + 2 pitting edema of ankle and foot which is not present on the L. She also has slight pink linear area which does not seem conected to the erythema around the wound and goes to below lateral knee. Has a pea zide tender area on the lower area which is tender, but is soft and does not feel nodular like a node or like a cord. It is minimally warm. I marked the  Areas of erythema   Lymphadenopathy:     Lower Body: No right inguinal adenopathy. No left inguinal adenopathy.     Comments: No popliteal nodes noted.   Skin:    General: Skin is warm and dry.     Capillary Refill: Capillary refill takes less than 2 seconds.  Neurological:     Mental Status: She is alert and oriented to person, place, and time.     Gait: Gait normal.  Psychiatric:        Mood and Affect: Mood normal.        Behavior: Behavior normal.        Thought Content: Thought content normal.        Judgment: Judgment normal.      UC Treatments / Results  Labs (all labs ordered are listed, but only abnormal results are displayed) Labs Reviewed - No data  to display  EKG   Radiology No results found.  Procedures Procedures (including critical care time)  Medications Ordered in UC Medications - No data to display  Initial Impression / Assessment and Plan / UC Course  I have reviewed the triage vital signs and the nursing notes. Cat bite with cellulitis and questionable lymphangitis. Since she is allergic to [facility], I did not give her Rocephin. I started her on Doxy and told her to FU with PCP tomorrow, but she states she would rather come here, so she will. See instructions.  Final Clinical Impressions(s) / UC Diagnoses   Final diagnoses:  Animal bite  Cellulitis of leg, right     Discharge Instructions     Try to fit in 2 doses of your antibiotic today, then take one before coming tomorrow to recheck your wound. The the mark on the side of your leg goes out of the marking to your thigh, then go to the ER because you will need IV antibiotics.     ED Prescriptions    Medication Sig Dispense Auth. Provider   doxycycline (VIBRAMYCIN) 100 MG capsule Take 1 capsule (100 mg total) by mouth 2 (two) times daily. 20 capsule Rodriguez-Southworth, Sunday Spillers, PA-C     PDMP not reviewed this encounter.   Shelby Mattocks, PA-C 12/07/20 1443

## 2020-12-07 NOTE — ED Triage Notes (Signed)
Cat bite to lower RT leg on shin x 1 week ago.  Area and surround area is tender and draining. Pt has been using peroxide daily to clean wound.

## 2020-12-08 ENCOUNTER — Ambulatory Visit
Admission: EM | Admit: 2020-12-08 | Discharge: 2020-12-08 | Disposition: A | Discharge status: Home / Self Care | Payer: Medicare PPO

## 2020-12-08 ENCOUNTER — Encounter: Payer: Self-pay | Admitting: Emergency Medicine

## 2020-12-08 DIAGNOSIS — S81851D Open bite, right lower leg, subsequent encounter: Secondary | ICD-10-CM | POA: Diagnosis not present

## 2020-12-08 DIAGNOSIS — Z5189 Encounter for other specified aftercare: Secondary | ICD-10-CM | POA: Diagnosis not present

## 2020-12-08 DIAGNOSIS — S81851A Open bite, right lower leg, initial encounter: Secondary | ICD-10-CM

## 2020-12-08 DIAGNOSIS — W5501XD Bitten by cat, subsequent encounter: Secondary | ICD-10-CM

## 2020-12-08 DIAGNOSIS — W5501XA Bitten by cat, initial encounter: Secondary | ICD-10-CM

## 2020-12-08 NOTE — ED Provider Notes (Signed)
[place]   [id number] 12/08/20 Arrival Time: 4098  CC: Wound check  SUBJECTIVE:  Catherine Burke is a 80 y.o. female who presents for wound check.  Had cat bite to RT lower extremity apx 1 week ago.  Seen here and started on doxycycline.  Instructed to return to have wound recheck.  Patient denies increased redness, swelling, pain, or drainage.  Has taken three doses of doxycycline.  Denies fever, chills, nausea, vomiting, decrease strength or sensation.   UTD on tetanus  ROS: As per HPI.  All other pertinent ROS negative.     Past Medical History:  Diagnosis Date  . Anemia   . Arthritis   . Diabetes mellitus   . GERD (gastroesophageal reflux disease)   . HLD (hyperlipidemia)   . Hypercholesterolemia   . Hypertension   . Kidney stones   . Osteoporosis   . Proteinuria    Past Surgical History:  Procedure Laterality Date  . ABDOMINAL HYSTERECTOMY    . BACK SURGERY     lumbar  . BREAST CYST ASPIRATION     unsure of laterality   . CAPD INSERTION N/A 02/13/2018   Procedure: LAPAROSCOPIC INSERTION CONTINUOUS AMBULATORY PERITONEAL DIALYSIS  (CAPD) CATHETER;  Surgeon: Algernon Huxley, MD;  Location: [facility] ORS;  Service: Vascular;  Laterality: N/A;  . CHOLECYSTECTOMY    . EYE SURGERY     bilateral cataract   . NASAL SEPTUM SURGERY     Allergies  Allergen Reactions  . Levofloxacin Other (See Comments)    Throat Swelling   . Penicillins Anaphylaxis  . Other   . Penicillin G Other (See Comments)  . Sulfa Antibiotics    No current facility-administered medications on file prior to encounter.   Current Outpatient Medications on File Prior to Encounter  Medication Sig Dispense Refill  . ACCU-CHEK AVIVA PLUS test strip TESTING TWICE DAILY. 100 each 11  . Acetaminophen (TYLENOL) 325 MG CAPS Take 1 capsule by mouth daily as needed (pain).    Marland Kitchen atorvastatin (LIPITOR) 10 MG tablet Take 10 mg by mouth daily.    . B Complex-C-Biotin-E-Min-FA (DIALYVITE 5000) 5 MG TABS Take  1 tablet by mouth daily.    . benazepril (LOTENSIN) 20 MG tablet Take 20 mg by mouth 2 (two) times daily.    . carvedilol (COREG) 3.125 MG tablet Take 3.125 mg by mouth 2 (two) times daily.    . cloNIDine (CATAPRES) 0.1 MG tablet Take by mouth.    . diclofenac Sodium (VOLTAREN) 1 % GEL Apply 2 g topically daily as needed (pain).    Marland Kitchen doxazosin (CARDURA) 2 MG tablet Take 2 mg by mouth at bedtime.    Marland Kitchen doxycycline (VIBRAMYCIN) 100 MG capsule Take 1 capsule (100 mg total) by mouth 2 (two) times daily. 20 capsule 0  . Ferrous Sulfate 134 MG TABS Take 1 tablet (134 mg total) by mouth daily. 30 tablet 1  . hydrALAZINE (APRESOLINE) 100 MG tablet Take 1 tablet (100 mg total) by mouth every 8 (eight) hours. 90 tablet 1  . Insulin Glargine (LANTUS SOLOSTAR) 100 UNIT/ML Solostar Pen Inject 20 Units into the skin at bedtime.     . Insulin Pen Needle 31G X 5 MM MISC As directed 100 each 5  . isosorbide mononitrate (IMDUR) 30 MG 24 hr tablet Take 30 mg by mouth daily.    . magnesium oxide (MAG-OX) 400 MG tablet Take 1 tablet by mouth daily.    . mirtazapine (REMERON) 7.5 MG tablet Take  7.5 mg by mouth at bedtime.    Marland Kitchen omeprazole (PRILOSEC) 40 MG capsule Take 40 mg by mouth daily.    . potassium chloride (KLOR-CON) 10 MEQ tablet Take 10 mEq by mouth daily.    Marland Kitchen senna (SENOKOT) 8.6 MG tablet Take 2 tablets by mouth daily.    Marland Kitchen spironolactone (ALDACTONE) 25 MG tablet Take 1 tablet (25 mg total) by mouth daily. 30 tablet 1  . torsemide (DEMADEX) 20 MG tablet Take 20 mg by mouth daily as needed (fluid).     Social History   Socioeconomic History  . Marital status: Married    Spouse name: Not on file  . Number of children: 3  . Years of education: Not on file  . Highest education level: Not on file  Occupational History  . Occupation: retired  Tobacco Use  . Smoking status: Never Smoker  . Smokeless tobacco: Never Used  Vaping Use  . Vaping Use: Never used  Substance and Sexual Activity  . Alcohol  use: No    Alcohol/week: 0.0 standard drinks  . Drug use: No  . Sexual activity: Yes  Other Topics Concern  . Not on file  Social History Narrative  . Not on file   Social Determinants of Health   Financial Resource Strain: Low Risk   . Difficulty of Paying Living Expenses: Not hard at all  Food Insecurity: No Food Insecurity  . Worried About Charity fundraiser in the Last Year: Never true  . Ran Out of Food in the Last Year: Never true  Transportation Needs: No Transportation Needs  . Lack of Transportation (Medical): No  . Lack of Transportation (Non-Medical): No  Physical Activity: Sufficiently Active  . Days of Exercise per Week: 5 days  . Minutes of Exercise per Session: 30 min  Stress: No Stress Concern Present  . Feeling of Stress : Not at all  Social Connections: Socially Integrated  . Frequency of Communication with Friends and Family: More than three times a week  . Frequency of Social Gatherings with Friends and Family: More than three times a week  . Attends Religious Services: More than 4 times per year  . Active Member of Clubs or Organizations: Yes  . Attends Archivist Meetings: More than 4 times per year  . Marital Status: Married  Human resources officer Violence: Not At Risk  . Fear of Current or Ex-Partner: No  . Emotionally Abused: No  . Physically Abused: No  . Sexually Abused: No   Family History  Problem Relation Age of Onset  . Other Mother   . Heart attack Father   . Breast cancer Paternal Aunt      OBJECTIVE:  Vitals:   12/08/20 1340  BP: (!) 154/62  Pulse: (!) 51  Resp: 17  Temp: 97.9 F (36.6 C)  TempSrc: Oral  SpO2: 94%     General appearance: alert; no distress Skin: cat bite wound to anterior RT lower leg (see picture below) with surrounding erythema and swelling, TTP, mild yellow drainage, 1-2 + pitting edema to foot and ankle Psychological: alert and cooperative; normal mood and affect       ASSESSMENT &  PLAN:  1. Open wound of right lower leg due to cat bite   2. Visit for wound check    Wash with warm water and mild soap Continue to monitor for signs of worsening infection or infection that is not responding to the antibiotic. Signs include increased redness, swelling, drainage,  fever, chills Continue with antibiotic Take OTC tylenol as needed for pain relief Follow up with PCP for recheck next week Return or go to the ED if you have any new or worsening symptoms such as increased pain, redness, swelling, drainage, discharge, decreased range of motion of extremity, etc..    Reviewed expectations re: course of current medical issues. Questions answered. Outlined signs and symptoms indicating need for more acute intervention. Patient verbalized understanding. After Visit Summary given.   Lestine Box, PA-C 12/08/20 1357

## 2020-12-08 NOTE — Discharge Instructions (Signed)
Wash with warm water and mild soap Continue to monitor for signs of worsening infection or infection that is not responding to the antibiotic. Signs include increased redness, swelling, drainage, fever, chills Continue with antibiotic Take OTC tylenol as needed for pain relief Follow up with PCP for recheck next week Return or go to the ED if you have any new or worsening symptoms such as increased pain, redness, swelling, drainage, discharge, decreased range of motion of extremity, etc..

## 2020-12-08 NOTE — ED Triage Notes (Signed)
Told by provider yesterday to come back in today for recheck on wound to Lower RT shin

## 2020-12-15 ENCOUNTER — Other Ambulatory Visit: Payer: Self-pay | Admitting: Internal Medicine

## 2020-12-15 DIAGNOSIS — Z1231 Encounter for screening mammogram for malignant neoplasm of breast: Secondary | ICD-10-CM

## 2021-01-06 ENCOUNTER — Other Ambulatory Visit: Payer: Self-pay

## 2021-01-06 ENCOUNTER — Ambulatory Visit
Admission: RE | Admit: 2021-01-06 | Discharge: 2021-01-06 | Disposition: A | Discharge status: Home / Self Care | Payer: Medicare PPO | Source: Ambulatory Visit | Attending: Internal Medicine | Admitting: Internal Medicine

## 2021-01-06 DIAGNOSIS — Z1231 Encounter for screening mammogram for malignant neoplasm of breast: Secondary | ICD-10-CM | POA: Diagnosis present

## 2021-01-12 ENCOUNTER — Other Ambulatory Visit: Payer: Self-pay | Admitting: Internal Medicine

## 2021-01-12 DIAGNOSIS — N6310 Unspecified lump in the right breast, unspecified quadrant: Secondary | ICD-10-CM

## 2021-01-12 DIAGNOSIS — R928 Other abnormal and inconclusive findings on diagnostic imaging of breast: Secondary | ICD-10-CM

## 2021-01-13 ENCOUNTER — Ambulatory Visit
Admission: RE | Admit: 2021-01-13 | Discharge: 2021-01-13 | Disposition: A | Discharge status: Home / Self Care | Payer: Medicare PPO | Source: Ambulatory Visit | Attending: Internal Medicine | Admitting: Internal Medicine

## 2021-01-13 ENCOUNTER — Other Ambulatory Visit: Payer: Self-pay

## 2021-01-13 DIAGNOSIS — N6310 Unspecified lump in the right breast, unspecified quadrant: Secondary | ICD-10-CM | POA: Diagnosis present

## 2021-01-13 DIAGNOSIS — R928 Other abnormal and inconclusive findings on diagnostic imaging of breast: Secondary | ICD-10-CM | POA: Diagnosis present

## 2021-03-08 ENCOUNTER — Inpatient Hospital Stay (HOSPITAL_COMMUNITY): Payer: Medicare PPO

## 2021-03-14 ENCOUNTER — Ambulatory Visit (HOSPITAL_COMMUNITY): Payer: Medicare PPO | Admitting: Hematology

## 2021-03-28 ENCOUNTER — Inpatient Hospital Stay (HOSPITAL_COMMUNITY): Payer: Medicare PPO | Attending: Hematology

## 2021-03-28 ENCOUNTER — Other Ambulatory Visit: Payer: Self-pay

## 2021-03-28 DIAGNOSIS — N186 End stage renal disease: Secondary | ICD-10-CM | POA: Insufficient documentation

## 2021-03-28 DIAGNOSIS — Z992 Dependence on renal dialysis: Secondary | ICD-10-CM

## 2021-03-28 DIAGNOSIS — D631 Anemia in chronic kidney disease: Secondary | ICD-10-CM | POA: Diagnosis not present

## 2021-03-28 LAB — CBC WITH DIFFERENTIAL/PLATELET
Abs Immature Granulocytes: 0.04 10*3/uL (ref 0.00–0.07)
Basophils Absolute: 0 10*3/uL (ref 0.0–0.1)
Basophils Relative: 1 %
Eosinophils Absolute: 0.1 10*3/uL (ref 0.0–0.5)
Eosinophils Relative: 3 %
HCT: 26.9 % — ABNORMAL LOW (ref 36.0–46.0)
Hemoglobin: 8.4 g/dL — ABNORMAL LOW (ref 12.0–15.0)
Immature Granulocytes: 1 %
Lymphocytes Relative: 16 %
Lymphs Abs: 0.5 10*3/uL — ABNORMAL LOW (ref 0.7–4.0)
MCH: 29.2 pg (ref 26.0–34.0)
MCHC: 31.2 g/dL (ref 30.0–36.0)
MCV: 93.4 fL (ref 80.0–100.0)
Monocytes Absolute: 0.2 10*3/uL (ref 0.1–1.0)
Monocytes Relative: 5 %
Neutro Abs: 2.5 10*3/uL (ref 1.7–7.7)
Neutrophils Relative %: 74 %
Platelets: 201 10*3/uL (ref 150–400)
RBC: 2.88 MIL/uL — ABNORMAL LOW (ref 3.87–5.11)
RDW: 17.2 % — ABNORMAL HIGH (ref 11.5–15.5)
WBC: 3.3 10*3/uL — ABNORMAL LOW (ref 4.0–10.5)
nRBC: 0 % (ref 0.0–0.2)

## 2021-03-28 LAB — IRON AND TIBC
Iron: 50 ug/dL (ref 28–170)
Saturation Ratios: 31 % (ref 10.4–31.8)
TIBC: 160 ug/dL — ABNORMAL LOW (ref 250–450)
UIBC: 110 ug/dL

## 2021-03-28 LAB — LACTATE DEHYDROGENASE: LDH: 187 U/L (ref 98–192)

## 2021-03-28 LAB — FERRITIN: Ferritin: 893 ng/mL — ABNORMAL HIGH (ref 11–307)

## 2021-03-30 LAB — PROTEIN ELECTROPHORESIS, SERUM
A/G Ratio: 1 (ref 0.7–1.7)
Albumin ELP: 2.6 g/dL — ABNORMAL LOW (ref 2.9–4.4)
Alpha-1-Globulin: 0.2 g/dL (ref 0.0–0.4)
Alpha-2-Globulin: 0.6 g/dL (ref 0.4–1.0)
Beta Globulin: 0.6 g/dL — ABNORMAL LOW (ref 0.7–1.3)
Gamma Globulin: 1.3 g/dL (ref 0.4–1.8)
Globulin, Total: 2.7 g/dL (ref 2.2–3.9)
Total Protein ELP: 5.3 g/dL — ABNORMAL LOW (ref 6.0–8.5)

## 2021-03-31 LAB — METHYLMALONIC ACID, SERUM: Methylmalonic Acid, Quantitative: 497 nmol/L — ABNORMAL HIGH (ref 0–378)

## 2021-04-01 NOTE — Progress Notes (Signed)
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 female returns for routine follow-up of her anemia secondary to ESRD.  She was last seen by Dr. Delton Coombes on 11/29/2020.  At today's visit, she reports feeling fair.  No recent hospitalizations, surgeries, or changes in baseline health status.  However, she reports that her husband died last month.  As such, she has been feeling more physically and emotionally fatigued.  She reports that she has not been eating well for the past several weeks, but is "just starting to get back on her feet and eat again."  She continues to take the [place] vitamin tablets prescribed by her nephrologist.  She does not take any additional B12 or iron pills.  She continues to receive Epogen injections at dialysis.  She reports that she used to get IV iron supplementation at dialysis, but has not received it for several months.  She denies any signs or symptoms of blood loss such as epistaxis, hematemesis, hemoptysis, hematuria, hematochezia, or melena.   She is symptomatic from her anemia with fatigue and dyspnea on exertion, reports that she gets out of breath after walking about 50 yards.  She denies any chest pain, dizziness, headache, syncope, palpitations.  She has 30% energy and 30% appetite. She endorses that she is maintaining a stable weight.    REVIEW OF SYSTEMS:  Review of Systems  Constitutional:  Positive for appetite change and fatigue. Negative for chills, diaphoresis, fever and unexpected weight change.  HENT:   Negative for lump/mass and nosebleeds.   Eyes:  Negative for eye problems.   Respiratory:  Positive for shortness of breath. Negative for cough and hemoptysis.   Cardiovascular:  Negative for chest pain, leg swelling and palpitations.  Gastrointestinal:  Negative for abdominal pain, blood in stool, constipation, diarrhea, nausea and vomiting.  Genitourinary:  Negative for hematuria.   Skin: Negative.   Neurological:  Negative for dizziness, headaches and light-headedness.  Hematological:  Does not bruise/bleed easily.     PAST MEDICAL/SURGICAL HISTORY:  Past Medical History:  Diagnosis Date   Anemia    Arthritis    Diabetes mellitus    GERD (gastroesophageal reflux disease)    HLD (hyperlipidemia)    Hypercholesterolemia    Hypertension    Kidney stones    Osteoporosis    Proteinuria    Past Surgical History:  Procedure Laterality Date   ABDOMINAL HYSTERECTOMY     BACK SURGERY     lumbar   BREAST CYST ASPIRATION     unsure of laterality    CAPD INSERTION N/A 02/13/2018   Procedure: LAPAROSCOPIC INSERTION CONTINUOUS AMBULATORY PERITONEAL DIALYSIS  (CAPD) CATHETER;  Surgeon: Algernon Huxley, MD;  Location: [facility] ORS;  Service: Vascular;  Laterality: N/A;   CHOLECYSTECTOMY     EYE SURGERY     bilateral cataract    NASAL SEPTUM SURGERY       SOCIAL HISTORY:  Social History   Socioeconomic History   Marital status: Married    Spouse name: Not on file   Number of children: 3   Years of education: Not on file   Highest  education level: Not on file  Occupational History   Occupation: retired  Tobacco Use   Smoking status: Never   Smokeless tobacco: Never  Vaping Use   Vaping Use: Never used  Substance and Sexual Activity   Alcohol use: No    Alcohol/week: 0.0 standard drinks   Drug use: No   Sexual activity: Yes  Other Topics Concern   Not on file  Social History Narrative   Not on file   Social Determinants of Health   Financial Resource Strain: Low Risk    Difficulty of Paying Living Expenses: Not hard at all  Food Insecurity: No  Food Insecurity   Worried About Charity fundraiser in the Last Year: Never true   [place] in the Last Year: Never true  Transportation Needs: No Transportation Needs   Lack of Transportation (Medical): No   Lack of Transportation (Non-Medical): No  Physical Activity: Sufficiently Active   Days of Exercise per Week: 5 days   Minutes of Exercise per Session: 30 min  Stress: No Stress Concern Present   Feeling of Stress : Not at all  Social Connections: Socially Integrated   Frequency of Communication with Friends and Family: More than three times a week   Frequency of Social Gatherings with Friends and Family: More than three times a week   Attends Religious Services: More than 4 times per year   Active Member of [employer] or Organizations: Yes   Attends Music therapist: More than 4 times per year   Marital Status: Married  Human resources officer Violence: Not At Risk   Fear of Current or Ex-Partner: No   Emotionally Abused: No   Physically Abused: No   Sexually Abused: No    FAMILY HISTORY:  Family History  Problem Relation Age of Onset   Other Mother    Heart attack Father    Breast cancer Paternal Aunt     CURRENT MEDICATIONS:  Outpatient Encounter Medications as of 04/04/2021  Medication Sig   ACCU-CHEK AVIVA PLUS test strip TESTING TWICE DAILY.   Acetaminophen (TYLENOL) 325 MG CAPS Take 1 capsule by mouth daily as needed (pain).   atorvastatin (LIPITOR) 10 MG tablet Take 10 mg by mouth daily.   B Complex-C-Biotin-E-Min-FA (DIALYVITE 5000) 5 MG TABS Take 1 tablet by mouth daily.   benazepril (LOTENSIN) 20 MG tablet Take 20 mg by mouth 2 (two) times daily.   carvedilol (COREG) 3.125 MG tablet Take 3.125 mg by mouth 2 (two) times daily.   cloNIDine (CATAPRES) 0.1 MG tablet Take by mouth.   diclofenac Sodium (VOLTAREN) 1 % GEL Apply 2 g topically daily as needed (pain).   doxazosin (CARDURA) 2 MG tablet Take 2 mg by mouth at bedtime.   doxycycline (VIBRAMYCIN)  100 MG capsule Take 1 capsule (100 mg total) by mouth 2 (two) times daily.   Ferrous Sulfate 134 MG TABS Take 1 tablet (134 mg total) by mouth daily.   hydrALAZINE (APRESOLINE) 100 MG tablet Take 1 tablet (100 mg total) by mouth every 8 (eight) hours.   Insulin Glargine (LANTUS SOLOSTAR) 100 UNIT/ML Solostar Pen Inject 20 Units into the skin at bedtime.    Insulin Pen Needle 31G X 5 MM MISC As directed   isosorbide mononitrate (IMDUR) 30 MG 24 hr tablet Take 30 mg by mouth daily.   magnesium oxide (MAG-OX) 400 MG tablet Take 1 tablet by mouth daily.   mirtazapine (REMERON) 7.5 MG tablet Take 7.5 mg by mouth  at bedtime.   omeprazole (PRILOSEC) 40 MG capsule Take 40 mg by mouth daily.   potassium chloride (KLOR-CON) 10 MEQ tablet Take 10 mEq by mouth daily.   senna (SENOKOT) 8.6 MG tablet Take 2 tablets by mouth daily.   spironolactone (ALDACTONE) 25 MG tablet Take 1 tablet (25 mg total) by mouth daily.   torsemide (DEMADEX) 20 MG tablet Take 20 mg by mouth daily as needed (fluid).   No facility-administered encounter medications on file as of 04/04/2021.    ALLERGIES:  Allergies  Allergen Reactions   Levofloxacin Other (See Comments)    Throat Swelling    Penicillins Anaphylaxis   Other    Penicillin G Other (See Comments)   Sulfa Antibiotics      PHYSICAL EXAM:  ECOG PERFORMANCE STATUS: 1 - Symptomatic but completely ambulatory  There were no vitals filed for this visit. There were no vitals filed for this visit. Physical Exam Constitutional:      Appearance: Normal appearance.  HENT:     Head: Normocephalic and atraumatic.     Mouth/Throat:     Mouth: Mucous membranes are moist.  Eyes:     Extraocular Movements: Extraocular movements intact.     Pupils: Pupils are equal, round, and reactive to light.  Cardiovascular:     Rate and Rhythm: Normal rate and regular rhythm.     Pulses: Normal pulses.     Heart sounds: Normal heart sounds.  Pulmonary:     Effort: Pulmonary  effort is normal.     Breath sounds: Normal breath sounds.  Abdominal:     General: Bowel sounds are normal.     Palpations: Abdomen is soft.     Tenderness: There is no abdominal tenderness.  Musculoskeletal:        General: No swelling.     Right lower leg: No edema.     Left lower leg: No edema.  Lymphadenopathy:     Cervical: No cervical adenopathy.  Skin:    General: Skin is warm and dry.  Neurological:     General: No focal deficit present.     Mental Status: She is alert and oriented to person, place, and time.  Psychiatric:        Mood and Affect: Mood normal.        Behavior: Behavior normal.     LABORATORY DATA:  I have reviewed the labs as listed.  CBC    Component Value Date/Time   WBC 3.3 (L) 03/28/2021 1448   RBC 2.88 (L) 03/28/2021 1448   HGB 8.4 (L) 03/28/2021 1448   HCT 26.9 (L) 03/28/2021 1448   PLT 201 03/28/2021 1448   MCV 93.4 03/28/2021 1448   MCH 29.2 03/28/2021 1448   MCHC 31.2 03/28/2021 1448   RDW 17.2 (H) 03/28/2021 1448   LYMPHSABS 0.5 (L) 03/28/2021 1448   MONOABS 0.2 03/28/2021 1448   EOSABS 0.1 03/28/2021 1448   BASOSABS 0.0 03/28/2021 1448   CMP Latest Ref Rng & Units 10/12/2020 09/09/2020 09/08/2020  Glucose 70 - 99 mg/dL - 208(H) 157(H)  BUN 8 - 23 mg/dL - 64(H) 59(H)  Creatinine 0.44 - 1.00 mg/dL - 5.95(H) 5.91(H)  Sodium 135 - 145 mmol/L - 138 135  Potassium 3.5 - 5.1 mmol/L - 3.7 3.4(L)  Chloride 98 - 111 mmol/L - 100 99  CO2 22 - 32 mmol/L - 23 21(L)  Calcium 8.7 - 10.3 mg/dL 7.5(L) 7.7(L) 7.2(L)  Total Protein 6.5 - 8.1 g/dL - - 6.4(L)    Total Bilirubin 0.3 - 1.2 mg/dL - - 0.4  Alkaline Phos 38 - 126 U/L - - 65  AST 15 - 41 U/L - - 18  ALT 0 - 44 U/L - - 14    DIAGNOSTIC IMAGING:  I have independently reviewed the relevant imaging and discussed with the patient.  ASSESSMENT & PLAN: 1.  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 Kolluru for normocytic anemia, with decreased responsiveness to ESA. - Patient on  peritoneal dialysis since March 18, 2018 secondary to diabetes kidney disease. - She has been receiving 36,000 units of Epogen every 2 weeks. - Denies any bleeding per rectum or melena.  Stool occult blood was negative x3. - Received 2 units of PRBC in January, last transfusion on 09/08/2020.  Total 4 transfusions in the last 1 year. - At last visit, she was noted to have normal B12 but elevated methylmalonic acid, indicative of B12 deficiency - given B12 injection (10/28/2020) and started on oral B12 supplementation daily (Dialyvite w/ 2 mg Vitamin B12) - Most recent labs (03/28/2021): Hgb 8.4, MCV 93.4, with low WBC 3.3, and normal platelets 201; methylmalonic acid remains elevated at 497; LDH normal at 187; ferritin 893 with 31% saturation - She is also noted to have mild leukopenia with WBC 3.3, which may also be due to B12 deficiency - PLAN: Due to persistent B12 deficiency despite oral repletion, we will start patient on B12 injections.  RTC in 3 months, and consider bone marrow biopsy if no improvement despite B12 injection repletion.  2.  Monoclonal gammopathy: - Initial work-up (10/12/2020) showed M spike 0.1 with kappa light chains 217, lambda light chains 264 with a ratio of 0.82.  Immunofixation was negative.  LDH normalized. - Repeat SPEP (03/28/2021): Negative for M spike - PLAN: We will repeat MGUS panel in 6 months.  3.  Social/family history: -She worked as a fourth grade schoolteacher.  Never smoker. -Paternal grandfather had stomach cancer and paternal aunt had breast cancer.   PLAN SUMMARY & DISPOSITION: -Monthly B12 injection - Office visit in 3 months with labs the week before  All questions were answered. The patient knows to call the clinic with any problems, questions or concerns.  Medical decision making: Moderate  Time spent on visit: I spent 20 minutes counseling the patient face to face. The total time spent in the appointment was 30 minutes and more than 50% was on  counseling.   Rebekah M Pennington, PA-C  04/04/2021 3:30 PM    

## 2021-04-04 ENCOUNTER — Encounter (HOSPITAL_COMMUNITY): Payer: Self-pay | Admitting: Physician Assistant

## 2021-04-04 ENCOUNTER — Inpatient Hospital Stay (HOSPITAL_COMMUNITY): Payer: Medicare PPO | Attending: Hematology | Admitting: Physician Assistant

## 2021-04-04 ENCOUNTER — Other Ambulatory Visit: Payer: Self-pay

## 2021-04-04 VITALS — BP 130/50 | HR 72 | Temp 97.1°F | Resp 18 | Wt 134.4 lb

## 2021-04-04 DIAGNOSIS — N186 End stage renal disease: Secondary | ICD-10-CM | POA: Insufficient documentation

## 2021-04-04 DIAGNOSIS — Z992 Dependence on renal dialysis: Secondary | ICD-10-CM

## 2021-04-04 DIAGNOSIS — D631 Anemia in chronic kidney disease: Secondary | ICD-10-CM | POA: Insufficient documentation

## 2021-04-04 DIAGNOSIS — D649 Anemia, unspecified: Secondary | ICD-10-CM

## 2021-04-04 DIAGNOSIS — E538 Deficiency of other specified B group vitamins: Secondary | ICD-10-CM | POA: Diagnosis not present

## 2021-04-04 HISTORY — DX: Deficiency of other specified B group vitamins: E53.8

## 2021-04-04 MED ORDER — CYANOCOBALAMIN 1000 MCG/ML IJ SOLN
1000.0000 ug | Freq: Once | INTRAMUSCULAR | Status: AC
  Administered 2021-04-04: 1000 ug via INTRAMUSCULAR

## 2021-04-04 MED ORDER — CYANOCOBALAMIN 1000 MCG/ML IJ SOLN
INTRAMUSCULAR | Status: AC
  Filled 2021-04-04: qty 1

## 2021-04-04 NOTE — Patient Instructions (Addendum)
[place] [facility] Discharge Instructions  You were seen today by Tarri Abernethy PA-C for your anemia.  You continue to remain anemic, with your hemoglobin being low at 8.4 today.  Your iron levels are normal.  Your B12 remains low despite your vitamin supplementation.  We recommend that you start receiving monthly B12 injections here at the hematology/cancer clinic.  We will check your labs again in 3 months.  If your blood count remains low despite correction of your B12, we may need to do a bone marrow biopsies to see if there is something else causing your blood counts to be low.  LABS: Return in 3 months for repeat labs  OTHER TESTS: None at this time  MEDICATIONS: Continue all previously prescribed home medications.  START monthly B12 injections here at the hematology/cancer clinic.  FOLLOW-UP APPOINTMENT: Office visit in 3 months.   Thank you for choosing [place] at [facility] to provide your oncology and hematology care.  To afford each patient quality time with our provider, please arrive at least 15 minutes before your scheduled appointment time.   If you have a lab appointment with the [facility] please come in thru the Main Entrance and check in at the main information desk.  You need to re-schedule your appointment should you arrive 10 or more minutes late.  We strive to give you quality time with our providers, and arriving late affects you and other patients whose appointments are after yours.  Also, if you no show three or more times for appointments you may be dismissed from the clinic at the providers discretion.     Again, thank you for choosing [facility].  Our hope is that these requests will decrease the amount of time that you wait before being seen by our physicians.       _____________________________________________________________  Should you have questions after your visit to [facility], please contact our office at [phone] and follow the prompts.  Our office hours are 8:00 a.m. and 4:30 p.m. Monday - Friday.  Please note that voicemails left after 4:00 p.m. may not be returned until the following business day.  We are closed weekends and major holidays.  You do have access to a nurse 24-7, just call the main number to the clinic [phone] and do not press any options, hold on the line and a nurse will answer the phone.    For prescription refill requests, have your pharmacy contact our office and allow 72 hours.    Due to Covid, you will need to wear a mask upon entering the hospital. If you do not have a mask, a mask will be given to you at the Main Entrance upon arrival. For doctor visits, patients may have 1 support person age 38 or older with them. For treatment visits, patients can not have anyone with them due to social distancing guidelines and our immunocompromised population.    [facility]  Discharge Instructions: Thank you for choosing [facility] to provide your oncology and hematology care.  If you have a lab appointment with the [facility], please come in thru the Main Entrance and check in at the main information desk.  Wear comfortable clothing and clothing appropriate for easy access to any Portacath or PICC line.   We strive to give you quality time with your provider. You may need to reschedule your appointment if  you arrive late (15 or more minutes).  Arriving late affects you and other patients whose appointments are after yours.  Also, if you miss three or more appointments without notifying the office, you may be dismissed from the clinic at the provider's discretion.      For prescription refill requests, have your pharmacy contact our office and allow 72 hours for refills to be completed.    Today you received your B-12 injection.  You will be getting these injections monthly.      To help  prevent nausea and vomiting after your treatment, we encourage you to take your nausea medication as directed.  BELOW ARE SYMPTOMS THAT SHOULD BE REPORTED IMMEDIATELY: *FEVER GREATER THAN 100.4 F (38 C) OR HIGHER *CHILLS OR SWEATING *NAUSEA AND VOMITING THAT IS NOT CONTROLLED WITH YOUR NAUSEA MEDICATION *UNUSUAL SHORTNESS OF BREATH *UNUSUAL BRUISING OR BLEEDING *URINARY PROBLEMS (pain or burning when urinating, or frequent urination) *BOWEL PROBLEMS (unusual diarrhea, constipation, pain near the anus) TENDERNESS IN MOUTH AND THROAT WITH OR WITHOUT PRESENCE OF ULCERS (sore throat, sores in mouth, or a toothache) UNUSUAL RASH, SWELLING OR PAIN  UNUSUAL VAGINAL DISCHARGE OR ITCHING   Items with * indicate a potential emergency and should be followed up as soon as possible or go to the Emergency Department if any problems should occur.  Please show the CHEMOTHERAPY ALERT CARD or IMMUNOTHERAPY ALERT CARD at check-in to the Emergency Department and triage nurse.  Should you have questions after your visit or need to cancel or reschedule your appointment, please contact [facility] [phone]  and follow the prompts.  Office hours are 8:00 a.m. to 4:30 p.m. Monday - Friday. Please note that voicemails left after 4:00 p.m. may not be returned until the following business day.  We are closed weekends and major holidays. You have access to a nurse at all times for urgent questions. Please call the main number to the clinic [phone] and follow the prompts.  For any non-urgent questions, you may also contact your provider using MyChart. We now offer e-Visits for anyone 62 and older to request care online for non-urgent symptoms. For details visit [email or web address].   Also download the MyChart app! Go to the app store, search "MyChart", open the app, select [facility], and log in with your MyChart username and password.  Due to Covid, a mask is required upon entering the  hospital/clinic. If you do not have a mask, one will be given to you upon arrival. For doctor visits, patients may have 1 support person aged 26 or older with them. For treatment visits, patients cannot have anyone with them due to current Covid guidelines and our immunocompromised population.

## 2021-04-04 NOTE — Progress Notes (Signed)
Patient tolerated B12 injection with no complaints voiced.  Site clean and dry with no bruising or swelling noted at site.  Band aid applied.  VSS during injection and at discharge.  Pt left ambulatory with no s/s of distress noted.

## 2021-04-25 ENCOUNTER — Emergency Department (HOSPITAL_COMMUNITY): Payer: Medicare PPO

## 2021-04-25 ENCOUNTER — Emergency Department (HOSPITAL_COMMUNITY)
Admission: EM | Admit: 2021-04-25 | Discharge: 2021-04-25 | Disposition: A | Discharge status: Home / Self Care | Payer: Medicare PPO | Attending: Emergency Medicine | Admitting: Emergency Medicine

## 2021-04-25 ENCOUNTER — Other Ambulatory Visit: Payer: Self-pay

## 2021-04-25 ENCOUNTER — Encounter (HOSPITAL_COMMUNITY): Payer: Self-pay | Admitting: *Deleted

## 2021-04-25 DIAGNOSIS — N186 End stage renal disease: Secondary | ICD-10-CM | POA: Diagnosis not present

## 2021-04-25 DIAGNOSIS — E876 Hypokalemia: Secondary | ICD-10-CM | POA: Diagnosis not present

## 2021-04-25 DIAGNOSIS — R0602 Shortness of breath: Secondary | ICD-10-CM | POA: Diagnosis present

## 2021-04-25 DIAGNOSIS — E1122 Type 2 diabetes mellitus with diabetic chronic kidney disease: Secondary | ICD-10-CM | POA: Diagnosis not present

## 2021-04-25 DIAGNOSIS — Z794 Long term (current) use of insulin: Secondary | ICD-10-CM | POA: Diagnosis not present

## 2021-04-25 DIAGNOSIS — Z79899 Other long term (current) drug therapy: Secondary | ICD-10-CM | POA: Insufficient documentation

## 2021-04-25 DIAGNOSIS — Z992 Dependence on renal dialysis: Secondary | ICD-10-CM | POA: Insufficient documentation

## 2021-04-25 DIAGNOSIS — R531 Weakness: Secondary | ICD-10-CM

## 2021-04-25 DIAGNOSIS — I120 Hypertensive chronic kidney disease with stage 5 chronic kidney disease or end stage renal disease: Secondary | ICD-10-CM | POA: Diagnosis not present

## 2021-04-25 LAB — CBC
HCT: 33.9 % — ABNORMAL LOW (ref 36.0–46.0)
Hemoglobin: 10.6 g/dL — ABNORMAL LOW (ref 12.0–15.0)
MCH: 30 pg (ref 26.0–34.0)
MCHC: 31.3 g/dL (ref 30.0–36.0)
MCV: 96 fL (ref 80.0–100.0)
Platelets: 230 10*3/uL (ref 150–400)
RBC: 3.53 MIL/uL — ABNORMAL LOW (ref 3.87–5.11)
RDW: 17.6 % — ABNORMAL HIGH (ref 11.5–15.5)
WBC: 3.8 10*3/uL — ABNORMAL LOW (ref 4.0–10.5)
nRBC: 0 % (ref 0.0–0.2)

## 2021-04-25 LAB — BASIC METABOLIC PANEL
Anion gap: 9 (ref 5–15)
BUN: 50 mg/dL — ABNORMAL HIGH (ref 8–23)
CO2: 26 mmol/L (ref 22–32)
Calcium: 7.1 mg/dL — ABNORMAL LOW (ref 8.9–10.3)
Chloride: 101 mmol/L (ref 98–111)
Creatinine, Ser: 7.27 mg/dL — ABNORMAL HIGH (ref 0.44–1.00)
GFR, Estimated: 5 mL/min — ABNORMAL LOW (ref >60)
Glucose, Bld: 213 mg/dL — ABNORMAL HIGH (ref 70–99)
Potassium: 3 mmol/L — ABNORMAL LOW (ref 3.5–5.1)
Sodium: 136 mmol/L (ref 135–145)

## 2021-04-25 LAB — MAGNESIUM: Magnesium: 1.6 mg/dL — ABNORMAL LOW (ref 1.7–2.4)

## 2021-04-25 MED ORDER — POTASSIUM CHLORIDE CRYS ER 20 MEQ PO TBCR
40.0000 meq | EXTENDED_RELEASE_TABLET | Freq: Once | ORAL | Status: AC
  Administered 2021-04-25: 40 meq via ORAL
  Filled 2021-04-25: qty 2

## 2021-04-25 NOTE — ED Provider Notes (Signed)
[facility] EMERGENCY DEPARTMENT Provider Note  CSN: [id number] Arrival date & time: 04/25/21 1550    History Chief Complaint  Patient presents with   Shortness of Breath    Catherine Burke is a 80 y.o. 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 Her husband died recently and she has not felt well since then. She has not had a fever, cough, CP, N/V/D or abdominal pain. She has had anemia in the past requiring transfusion that felt similar.    Past Medical History:  Diagnosis Date   Anemia    Arthritis    B12 deficiency 04/04/2021   Diabetes mellitus    GERD (gastroesophageal reflux disease)    HLD (hyperlipidemia)    Hypercholesterolemia    Hypertension    Kidney stones    Osteoporosis    Proteinuria     Past Surgical History:  Procedure Laterality Date   ABDOMINAL HYSTERECTOMY     BACK SURGERY     lumbar   BREAST CYST ASPIRATION     unsure of laterality    CAPD INSERTION N/A 02/13/2018   Procedure: LAPAROSCOPIC INSERTION CONTINUOUS AMBULATORY PERITONEAL DIALYSIS  (CAPD) CATHETER;  Surgeon: Algernon Huxley, MD;  Location: [facility] ORS;  Service: Vascular;  Laterality: N/A;   CHOLECYSTECTOMY     EYE SURGERY     bilateral cataract    NASAL SEPTUM SURGERY      Family History  Problem Relation Age of Onset   Other Mother    Heart attack Father    Breast cancer Paternal Aunt     Social History   Tobacco Use   Smoking status: Never   Smokeless tobacco: Never  Vaping Use   Vaping Use: Never used  Substance Use Topics   Alcohol use: No    Alcohol/week: 0.0 standard drinks   Drug use: No     Home Medications Prior to Admission medications   Medication Sig Start Date End Date Taking? Authorizing Provider  ACCU-CHEK AVIVA PLUS test strip TESTING TWICE DAILY. 08/11/14   Susy Frizzle, MD  Acetaminophen (TYLENOL) 325 MG CAPS Take 1 capsule by mouth daily as  needed (pain).    [provider]  atorvastatin (LIPITOR) 10 MG tablet Take 10 mg by mouth daily. 07/28/20   [provider]  B Complex-C-Biotin-E-Min-FA (DIALYVITE 5000) 5 MG TABS Take 1 tablet by mouth daily. 10/27/20   [provider]  benazepril (LOTENSIN) 20 MG tablet Take 1 tablet by mouth 2 (two) times daily. 02/07/21   [provider]  carvedilol (COREG) 3.125 MG tablet Take 3.125 mg by mouth 2 (two) times daily. 10/27/20   [provider]  cloNIDine (CATAPRES) 0.1 MG tablet Take by mouth. 07/19/20   [provider]  diclofenac Sodium (VOLTAREN) 1 % GEL Apply 2 g topically daily as needed (pain).    [provider]  doxazosin (CARDURA) 2 MG tablet Take 2 mg by mouth at bedtime. 11/23/20   [provider]  doxycycline (VIBRAMYCIN) 100 MG capsule Take 1 capsule (100 mg total) by mouth 2 (two) times daily. 12/07/20   Rodriguez-Southworth, Sunday Spillers, PA-C  Ferrous Sulfate 134 MG TABS Take 1 tablet (134 mg total) by mouth daily. 09/09/20   Annita Brod, MD  hydrALAZINE (APRESOLINE) 100 MG tablet Take 1 tablet (100 mg total) by mouth every 8 (eight) hours. 10/13/17 07/21/20  Abel Presto  J, MD  Insulin Glargine (LANTUS SOLOSTAR) 100 UNIT/ML Solostar Pen Inject 20 Units into the skin at bedtime.  12/11/18   [provider]  Insulin Pen Needle 31G X 5 MM MISC As directed 03/31/15   Alycia Rossetti, MD  isosorbide mononitrate (IMDUR) 30 MG 24 hr tablet Take 30 mg by mouth daily. 08/12/20   [provider]  magnesium oxide (MAG-OX) 400 MG tablet Take 1 tablet by mouth daily. 10/13/20   [provider]  MAGNESIUM-OXIDE 400 (241.3 Mg) MG tablet Take 1 tablet by mouth daily. 12/03/20   [provider]  mirtazapine (REMERON) 7.5 MG tablet Take 7.5 mg by mouth at bedtime. 08/25/20   [provider]  omeprazole (PRILOSEC) 40 MG capsule Take 40 mg by mouth daily.    [provider]  potassium  chloride (KLOR-CON) 10 MEQ tablet Take 10 mEq by mouth daily. 03/11/20   [provider]  senna (SENOKOT) 8.6 MG tablet Take 2 tablets by mouth daily.    [provider]  spironolactone (ALDACTONE) 25 MG tablet Take 1 tablet (25 mg total) by mouth daily. 10/14/17 07/21/20  Henreitta Leber, MD  torsemide (DEMADEX) 20 MG tablet Take 20 mg by mouth daily as needed (fluid).    [provider]     Allergies    Levofloxacin, Penicillins, Other, Penicillin g, and Sulfa antibiotics   Review of Systems   Review of Systems A comprehensive review of systems was completed and negative except as noted in HPI.    Physical Exam BP (!) 146/65   Pulse 64   Temp 97.8 F (36.6 C) (Oral)   Resp (!) 22   SpO2 96%   Physical Exam Vitals and nursing note reviewed.  Constitutional:      Appearance: Normal appearance.  HENT:     Head: Normocephalic and atraumatic.     Nose: Nose normal.     Mouth/Throat:     Mouth: Mucous membranes are moist.  Eyes:     Extraocular Movements: Extraocular movements intact.     Conjunctiva/sclera: Conjunctivae normal.  Cardiovascular:     Rate and Rhythm: Normal rate.  Pulmonary:     Effort: Pulmonary effort is normal.     Breath sounds: Normal breath sounds.  Abdominal:     General: Abdomen is flat.     Palpations: Abdomen is soft.     Tenderness: There is no abdominal tenderness. There is no guarding.     Comments: PD catheter in LUQ  Musculoskeletal:        General: No swelling. Normal range of motion.     Cervical back: Neck supple.  Skin:    General: Skin is warm and dry.  Neurological:     General: No focal deficit present.     Mental Status: She is alert.  Psychiatric:        Mood and Affect: Mood normal.     ED Results / Procedures / Treatments   Labs (all labs ordered are listed, but only abnormal results are displayed) Labs Reviewed  BASIC METABOLIC PANEL - Abnormal; Notable for the following components:       Result Value   Potassium 3.0 (*)    Glucose, Bld 213 (*)    BUN 50 (*)    Creatinine, Ser 7.27 (*)    Calcium 7.1 (*)    GFR, Estimated 5 (*)    All other components within normal limits  CBC - Abnormal; Notable for the following components:  WBC 3.8 (*)    RBC 3.53 (*)    Hemoglobin 10.6 (*)    HCT 33.9 (*)    RDW 17.6 (*)    All other components within normal limits  MAGNESIUM - Abnormal; Notable for the following components:   Magnesium 1.6 (*)    All other components within normal limits  URINALYSIS, ROUTINE W REFLEX MICROSCOPIC    EKG EKG Interpretation  Date/Time:  Monday April 25 2021 17:40:52 EDT Ventricular Rate:  73 PR Interval:    QRS Duration: 88 QT Interval:  428 QTC Calculation: 471 R Axis:   -2 Text Interpretation: Accelerated Junctional rhythm Septal infarct , age undetermined Abnormal ECG Since last tracing p waves no longer apparent Otherwise no significant change Confirmed by Calvert Cantor [phone]) on 04/25/2021 6:23:24 PM  Radiology DG Chest 2 View  Result Date: 04/25/2021 CLINICAL DATA:  Shortness of breath EXAM: CHEST - 2 VIEW COMPARISON:  09/08/2020 FINDINGS: Bibasilar atelectasis or scarring. Heart is normal size. No effusions. Aortic atherosclerosis. No acute bony abnormality. IMPRESSION: Bibasilar scarring or atelectasis.  No active disease. Electronically Signed   By: Rolm Baptise M.D.   On: 04/25/2021 18:27    Procedures Procedures  Medications Ordered in the ED Medications  potassium chloride SA (KLOR-CON) CR tablet 40 mEq (40 mEq Oral Given 04/25/21 2013)     MDM Rules/Calculators/A&P MDM Patient with subjective SOB and general weakness. She admits to poor PO intake recently due to lack of appetite. Will check labs, CXR and EKG.   ED Course  I have reviewed the triage vital signs and the nursing notes.  Pertinent labs & imaging results that were available during my care of the patient were reviewed by me and considered in my  medical decision making (see chart for details).  Clinical Course as of 04/25/21 2229  Mon Apr 25, 2021  1946 CBC without significant anemia.  [CS]  1947 BMP with hypokalemia, CKD is worse than previous baseline but patient is due for her PD later tonight. CXR is clear.  [CS]  1952 Spoke with Dr. Joylene Grapes, Nephrology, regarding her hypokalemia. He recommends a one time dose of 61meq PO potassium, check Magnesium, skip PD tonight.  [CS]  2039 Magnesium only mildly decreased.  [CS]  2148 Patient unable to provide a urine specimen. She states she has had some decrease in her occasional urine output. She is eager for discharge and will follow up with her Nephrologist tomorrow regarding ongoing management of her CKD and hypokalemia. RTED for any other concerns.  [CS]    Clinical Course User Index [CS] Truddie Hidden, MD    Final Clinical Impression(s) / ED Diagnoses Final diagnoses:  Shortness of breath  General weakness  Hypokalemia  ESRD on peritoneal dialysis [facility])    Rx / DC Orders ED Discharge Orders     None        Truddie Hidden, MD 04/25/21 2229

## 2021-04-25 NOTE — ED Triage Notes (Signed)
C/o shortness of breath, denies pain. States she is a dialysis patient and has low hemoglobin

## 2021-05-02 ENCOUNTER — Encounter (HOSPITAL_COMMUNITY): Payer: Self-pay

## 2021-05-02 ENCOUNTER — Other Ambulatory Visit: Payer: Self-pay

## 2021-05-02 ENCOUNTER — Inpatient Hospital Stay (HOSPITAL_COMMUNITY): Payer: Medicare PPO

## 2021-05-02 VITALS — BP 160/66 | HR 67 | Temp 97.1°F | Resp 17

## 2021-05-02 DIAGNOSIS — N186 End stage renal disease: Secondary | ICD-10-CM | POA: Diagnosis not present

## 2021-05-02 DIAGNOSIS — E538 Deficiency of other specified B group vitamins: Secondary | ICD-10-CM

## 2021-05-02 MED ORDER — CYANOCOBALAMIN 1000 MCG/ML IJ SOLN
1000.0000 ug | Freq: Once | INTRAMUSCULAR | Status: AC
  Administered 2021-05-02: 1000 ug via INTRAMUSCULAR
  Filled 2021-05-02: qty 1

## 2021-05-02 NOTE — Progress Notes (Signed)
Patient tolerated injection with no complaints voiced.  Site clean and dry with no bruising or swelling noted at site.  See MAR for details.  Band aid applied.  Patient stable during and after injection.  Vss with discharge and left in satisfactory condition with no s/s of distress noted.  

## 2021-05-02 NOTE — Patient Instructions (Signed)
[facility]  Discharge Instructions: Thank you for choosing [facility] to provide your oncology and hematology care.  If you have a lab appointment with the Cancer Center, please come in thru the Main Entrance and check in at the main information desk.  Wear comfortable clothing and clothing appropriate for easy access to any Portacath or PICC line.   We strive to give you quality time with your provider. You may need to reschedule your appointment if you arrive late (15 or more minutes).  Arriving late affects you and other patients whose appointments are after yours.  Also, if you miss three or more appointments without notifying the office, you may be dismissed from the clinic at the provider's discretion.      For prescription refill requests, have your pharmacy contact our office and allow 72 hours for refills to be completed.    Today you received a b12 shot.       To help prevent nausea and vomiting after your treatment, we encourage you to take your nausea medication as directed.  BELOW ARE SYMPTOMS THAT SHOULD BE REPORTED IMMEDIATELY: *FEVER GREATER THAN 100.4 F (38 C) OR HIGHER *CHILLS OR SWEATING *NAUSEA AND VOMITING THAT IS NOT CONTROLLED WITH YOUR NAUSEA MEDICATION *UNUSUAL SHORTNESS OF BREATH *UNUSUAL BRUISING OR BLEEDING *URINARY PROBLEMS (pain or burning when urinating, or frequent urination) *BOWEL PROBLEMS (unusual diarrhea, constipation, pain near the anus) TENDERNESS IN MOUTH AND THROAT WITH OR WITHOUT PRESENCE OF ULCERS (sore throat, sores in mouth, or a toothache) UNUSUAL RASH, SWELLING OR PAIN  UNUSUAL VAGINAL DISCHARGE OR ITCHING   Items with * indicate a potential emergency and should be followed up as soon as possible or go to the Emergency Department if any problems should occur.  Please show the CHEMOTHERAPY ALERT CARD or IMMUNOTHERAPY ALERT CARD at check-in to the Emergency Department and triage nurse.  Should you have questions  after your visit or need to cancel or reschedule your appointment, please contact [facility] [phone]  and follow the prompts.  Office hours are 8:00 a.m. to 4:30 p.m. Monday - Friday. Please note that voicemails left after 4:00 p.m. may not be returned until the following business day.  We are closed weekends and major holidays. You have access to a nurse at all times for urgent questions. Please call the main number to the clinic [phone] and follow the prompts.  For any non-urgent questions, you may also contact your provider using MyChart. We now offer e-Visits for anyone 18 and older to request care online for non-urgent symptoms. For details visit [email or web address].   Also download the MyChart app! Go to the app store, search "MyChart", open the app, select [facility], and log in with your MyChart username and password.  Due to Covid, a mask is required upon entering the hospital/clinic. If you do not have a mask, one will be given to you upon arrival. For doctor visits, patients may have 1 support person aged 18 or older with them. For treatment visits, patients cannot have anyone with them due to current Covid guidelines and our immunocompromised population.  

## 2021-05-30 ENCOUNTER — Other Ambulatory Visit: Payer: Self-pay

## 2021-05-30 ENCOUNTER — Inpatient Hospital Stay (HOSPITAL_COMMUNITY): Payer: Medicare PPO | Attending: Hematology

## 2021-05-30 VITALS — BP 115/57 | HR 76 | Temp 97.9°F | Resp 18

## 2021-05-30 DIAGNOSIS — D631 Anemia in chronic kidney disease: Secondary | ICD-10-CM | POA: Diagnosis present

## 2021-05-30 DIAGNOSIS — E538 Deficiency of other specified B group vitamins: Secondary | ICD-10-CM | POA: Diagnosis not present

## 2021-05-30 DIAGNOSIS — N186 End stage renal disease: Secondary | ICD-10-CM | POA: Insufficient documentation

## 2021-05-30 MED ORDER — CYANOCOBALAMIN 1000 MCG/ML IJ SOLN
1000.0000 ug | Freq: Once | INTRAMUSCULAR | Status: AC
  Administered 2021-05-30: 1000 ug via INTRAMUSCULAR
  Filled 2021-05-30: qty 1

## 2021-05-30 NOTE — Patient Instructions (Signed)
[facility]  Discharge Instructions: °Thank you for choosing [facility] to provide your oncology and hematology care.  °If you have a lab appointment with the Cancer Center, please come in thru the Main Entrance and check in at the main information desk. ° ° ° °We strive to give you quality time with your provider. You may need to reschedule your appointment if you arrive late (15 or more minutes).  Arriving late affects you and other patients whose appointments are after yours.  Also, if you miss three or more appointments without notifying the office, you may be dismissed from the clinic at the provider’s discretion.    °  °For prescription refill requests, have your pharmacy contact our office and allow 72 hours for refills to be completed.   ° ° °  °For any non-urgent questions, you may also contact your provider using MyChart. We now offer e-Visits for anyone 18 and older to request care online for non-urgent symptoms. For details visit [email or web address]. °  °Also download the MyChart app! Go to the app store, search "MyChart", open the app, select [facility], and log in with your MyChart username and password. ° °Due to Covid, a mask is required upon entering the hospital/clinic. If you do not have a mask, one will be given to you upon arrival. For doctor visits, patients may have 1 support person aged 18 or older with them. For treatment visits, patients cannot have anyone with them due to current Covid guidelines and our immunocompromised population.  °

## 2021-06-02 ENCOUNTER — Other Ambulatory Visit: Payer: Self-pay

## 2021-06-02 ENCOUNTER — Encounter: Payer: Self-pay | Admitting: Orthopaedic Surgery

## 2021-06-02 ENCOUNTER — Ambulatory Visit (INDEPENDENT_AMBULATORY_CARE_PROVIDER_SITE_OTHER): Payer: Medicare PPO | Admitting: Orthopaedic Surgery

## 2021-06-02 ENCOUNTER — Ambulatory Visit: Payer: Medicare PPO

## 2021-06-02 VITALS — BP 171/81 | HR 97 | Ht 62.0 in | Wt 128.9 lb

## 2021-06-02 DIAGNOSIS — N186 End stage renal disease: Secondary | ICD-10-CM

## 2021-06-02 DIAGNOSIS — M25562 Pain in left knee: Secondary | ICD-10-CM

## 2021-06-02 DIAGNOSIS — G8929 Other chronic pain: Secondary | ICD-10-CM | POA: Diagnosis not present

## 2021-06-02 DIAGNOSIS — Z992 Dependence on renal dialysis: Secondary | ICD-10-CM | POA: Diagnosis not present

## 2021-06-02 NOTE — Progress Notes (Signed)
Subjective:    Patient ID: Catherine Burke, female    DOB: 19-Dec-1940, 80 y.o.   MRN: [id number]  HPI She has long history of left knee pain getting worse over the last month.  She has "put it off" coming to see someone about it.  She is on chronic home dialysis.  She has no trauma. She has had more swelling of the knee recently, inability to fully extend the knee and no direct trauma.  It pops and gives way.  She is limited what medicines she can take because of her home dialysis. She has no redness. She has begun using a cane.   Review of Systems  Constitutional:  Positive for activity change.  Genitourinary:        On home dialysis.  Musculoskeletal:  Positive for arthralgias, gait problem and joint swelling.  All other systems reviewed and are negative. For Review of Systems, all other systems reviewed and are negative.  The following is a summary of the past history medically, past history surgically, known current medicines, social history and family history.  This information is gathered electronically by the computer from prior information and documentation.  I review this each visit and have found including this information at this point in the chart is beneficial and informative.   Past Medical History:  Diagnosis Date   Anemia    Arthritis    B12 deficiency 04/04/2021   Diabetes mellitus    GERD (gastroesophageal reflux disease)    HLD (hyperlipidemia)    Hypercholesterolemia    Hypertension    Kidney stones    Osteoporosis    Proteinuria     Past Surgical History:  Procedure Laterality Date   ABDOMINAL HYSTERECTOMY     BACK SURGERY     lumbar   BREAST CYST ASPIRATION     unsure of laterality    CAPD INSERTION N/A 02/13/2018   Procedure: LAPAROSCOPIC INSERTION CONTINUOUS AMBULATORY PERITONEAL DIALYSIS  (CAPD) CATHETER;  Surgeon: Algernon Huxley, MD;  Location: [facility] ORS;  Service: Vascular;  Laterality: N/A;   CHOLECYSTECTOMY     EYE SURGERY     bilateral cataract     NASAL SEPTUM SURGERY      Current Outpatient Medications on File Prior to Visit  Medication Sig Dispense Refill   ACCU-CHEK AVIVA PLUS test strip TESTING TWICE DAILY. 100 each 11   Acetaminophen (TYLENOL) 325 MG CAPS Take 1 capsule by mouth daily as needed (pain).     atorvastatin (LIPITOR) 10 MG tablet Take 10 mg by mouth daily.     B Complex-C-Biotin-E-Min-FA (DIALYVITE 5000) 5 MG TABS Take 1 tablet by mouth daily.     benazepril (LOTENSIN) 20 MG tablet Take 1 tablet by mouth 2 (two) times daily.     carvedilol (COREG) 3.125 MG tablet Take 3.125 mg by mouth 2 (two) times daily.     cloNIDine (CATAPRES) 0.1 MG tablet Take by mouth.     diclofenac Sodium (VOLTAREN) 1 % GEL Apply 2 g topically daily as needed (pain).     doxazosin (CARDURA) 2 MG tablet Take 2 mg by mouth at bedtime.     doxycycline (VIBRAMYCIN) 100 MG capsule Take 1 capsule (100 mg total) by mouth 2 (two) times daily. 20 capsule 0   Ferrous Sulfate 134 MG TABS Take 1 tablet (134 mg total) by mouth daily. 30 tablet 1   Insulin Glargine (LANTUS SOLOSTAR) 100 UNIT/ML Solostar Pen Inject 20 Units into the skin at bedtime.  Insulin Pen Needle 31G X 5 MM MISC As directed 100 each 5   isosorbide mononitrate (IMDUR) 30 MG 24 hr tablet Take 30 mg by mouth daily.     magnesium oxide (MAG-OX) 400 MG tablet Take 1 tablet by mouth daily.     MAGNESIUM-OXIDE 400 (241.3 Mg) MG tablet Take 1 tablet by mouth daily.     mirtazapine (REMERON) 7.5 MG tablet Take 7.5 mg by mouth at bedtime.     omeprazole (PRILOSEC) 40 MG capsule Take 40 mg by mouth daily.     potassium chloride (KLOR-CON) 10 MEQ tablet Take 10 mEq by mouth daily.     senna (SENOKOT) 8.6 MG tablet Take 2 tablets by mouth daily.     torsemide (DEMADEX) 20 MG tablet Take 20 mg by mouth daily as needed (fluid).     hydrALAZINE (APRESOLINE) 100 MG tablet Take 1 tablet (100 mg total) by mouth every 8 (eight) hours. 90 tablet 1   spironolactone (ALDACTONE) 25 MG tablet Take 1  tablet (25 mg total) by mouth daily. 30 tablet 1   No current facility-administered medications on file prior to visit.    Social History   Socioeconomic History   Marital status: Married    Spouse name: Not on file   Number of children: 3   Years of education: Not on file   Highest education level: Not on file  Occupational History   Occupation: retired  Tobacco Use   Smoking status: Never   Smokeless tobacco: Never  Vaping Use   Vaping Use: Never used  Substance and Sexual Activity   Alcohol use: No    Alcohol/week: 0.0 standard drinks   Drug use: No   Sexual activity: Yes  Other Topics Concern   Not on file  Social History Narrative   Not on file   Social Determinants of Health   Financial Resource Strain: Low Risk    Difficulty of Paying Living Expenses: Not hard at all  Food Insecurity: No Food Insecurity   Worried About Charity fundraiser in the Last Year: Never true   [place] in the Last Year: Never true  Transportation Needs: No Transportation Needs   Lack of Transportation (Medical): No   Lack of Transportation (Non-Medical): No  Physical Activity: Sufficiently Active   Days of Exercise per Week: 5 days   Minutes of Exercise per Session: 30 min  Stress: No Stress Concern Present   Feeling of Stress : Not at all  Social Connections: Socially Integrated   Frequency of Communication with Friends and Family: More than three times a week   Frequency of Social Gatherings with Friends and Family: More than three times a week   Attends Religious Services: More than 4 times per year   Active Member of [employer] or Organizations: Yes   Attends Music therapist: More than 4 times per year   Marital Status: Married  Human resources officer Violence: Not At Risk   Fear of Current or Ex-Partner: No   Emotionally Abused: No   Physically Abused: No   Sexually Abused: No    Family History  Problem Relation Age of Onset   Other Mother    Heart attack  Father    Breast cancer Paternal Aunt     BP (!) 171/81   Pulse 97   Ht 5\' 2"  (1.575 m)   Wt 128 lb 14.4 oz (58.5 kg)   BMI 23.58 kg/m   Body mass index is  23.58 kg/m.     Objective:   Physical Exam Vitals and nursing note reviewed. Exam conducted with a chaperone present.  Constitutional:      Appearance: She is well-developed.  HENT:     Head: Normocephalic and atraumatic.  Eyes:     Conjunctiva/sclera: Conjunctivae normal.     Pupils: Pupils are equal, round, and reactive to light.  Cardiovascular:     Rate and Rhythm: Normal rate and regular rhythm.  Pulmonary:     Effort: Pulmonary effort is normal.  Abdominal:     Palpations: Abdomen is soft.  Musculoskeletal:     Cervical back: Normal range of motion and neck supple.       Legs:  Skin:    General: Skin is warm and dry.  Neurological:     Mental Status: She is alert and oriented to person, place, and time.     Cranial Nerves: No cranial nerve deficit.     Motor: No abnormal muscle tone.     Coordination: Coordination normal.     Deep Tendon Reflexes: Reflexes are normal and symmetric. Reflexes normal.  Psychiatric:        Behavior: Behavior normal.        Thought Content: Thought content normal.        Judgment: Judgment normal.  X-rays were done of the left knee, reported separately.        Assessment & Plan:   Encounter Diagnoses  Name Primary?   Chronic pain of left knee Yes   Chronic kidney disease with end stage renal failure on dialysis [facility])    PROCEDURE NOTE:  The patient request injection, verbal consent was obtained.  The left knee was prepped appropriately after time out was performed.   Sterile technique was observed and anesthesia was provided by ethyl chloride and a 20-gauge needle was used to inject the knee area.  A 16-gauge needle was then used to aspirate the knee.  Color of fluid aspirated was blood tinged  Total cc's aspirated was 30.    Injection of 1 cc of Celestone 6  mg with several cc's of plain xylocaine was then performed.  A band aid dressing was applied.  The patient was advised to apply ice later today and tomorrow to the injection sight as needed.   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  Return in two weeks.  Use Voltaren Gel.  Call if any problem.  Precautions discussed.  Electronically Signed Sanjuana Kava, MD 9/29/20229:52 AM

## 2021-06-16 ENCOUNTER — Ambulatory Visit: Payer: Medicare PPO | Admitting: Orthopaedic Surgery

## 2021-06-21 ENCOUNTER — Ambulatory Visit: Payer: Medicare PPO | Admitting: Orthopaedic Surgery

## 2021-06-23 ENCOUNTER — Ambulatory Visit: Payer: Medicare PPO | Admitting: Orthopaedic Surgery

## 2021-06-27 ENCOUNTER — Inpatient Hospital Stay (HOSPITAL_COMMUNITY): Payer: Medicare PPO

## 2021-06-27 ENCOUNTER — Ambulatory Visit (HOSPITAL_COMMUNITY): Payer: Medicare PPO

## 2021-07-03 NOTE — Progress Notes (Signed)
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  Monthly B12 injections.  INTERVAL HISTORY:  Catherine Burke 80 y.o. female returns for routine follow-up of her anemia of ESRD.  She was last seen by Tarri Abernethy PA-C on 04/04/2021.  At her last visit, she was started on monthly B12 injections.  At today's visit, she reports feeling fair.  No recent hospitalizations, surgeries, or changes in baseline health status.  She reports that she has felt somewhat improved after starting B12 injections.  She does have slightly better baseline energy, but reports that she still tires easily. She continues to receive Epogen at dialysis. She denies any abnormal bleeding such as epistaxis, hematemesis, hematochezia, or melena. She remains fatigued, but this is improved after starting B12.  She reports some persistent shortness of breath on exertion which she relates to physical deconditioning.   No chest pain, dizziness, headache, syncope, palpitations.  She reports poor appetite, which she attributes to some underlying grief and depression after her husband passed away in 04/24/2023.  She reports that she is trying to force herself to eat, but is struggling to learn how to cook for just one person.  She has lost 6 to 8 pounds over the last several months.  She has 60% energy and 40% appetite.    REVIEW OF SYSTEMS:  Review of Systems  Constitutional:  Positive for appetite change (Appetite 40%) and fatigue (Energy 60%). Negative for chills, diaphoresis, fever and unexpected weight change.  HENT:   Negative for lump/mass and nosebleeds.   Eyes:  Negative for eye problems.  Respiratory:  Positive for shortness  of breath (With exertion). Negative for cough and hemoptysis.   Cardiovascular:  Negative for chest pain, leg swelling and palpitations.  Gastrointestinal:  Positive for nausea. Negative for abdominal pain, blood in stool, constipation, diarrhea and vomiting.  Genitourinary:  Negative for hematuria.   Skin: Negative.   Neurological:  Negative for dizziness, headaches and light-headedness.  Hematological:  Does not bruise/bleed easily.     PAST MEDICAL/SURGICAL HISTORY:  Past Medical History:  Diagnosis Date   Anemia    Arthritis    B12 deficiency 04/04/2021   Diabetes mellitus    GERD (gastroesophageal reflux disease)    HLD (hyperlipidemia)    Hypercholesterolemia    Hypertension    Kidney stones    Osteoporosis    Proteinuria    Past Surgical History:  Procedure Laterality Date   ABDOMINAL HYSTERECTOMY     BACK SURGERY     lumbar   BREAST CYST ASPIRATION     unsure of laterality    CAPD INSERTION N/A 02/13/2018   Procedure: LAPAROSCOPIC INSERTION CONTINUOUS AMBULATORY PERITONEAL DIALYSIS  (CAPD) CATHETER;  Surgeon: Algernon Huxley, MD;  Location: [facility] ORS;  Service: Vascular;  Laterality: N/A;   CHOLECYSTECTOMY     EYE SURGERY     bilateral cataract    NASAL SEPTUM SURGERY       SOCIAL HISTORY:  Social History   Socioeconomic History   Marital status: Married    Spouse name: Not on file   Number of children: 3   Years of education: Not on file   Highest education level: Not on  file  Occupational History   Occupation: retired  Tobacco Use   Smoking status: Never   Smokeless tobacco: Never  Vaping Use   Vaping Use: Never used  Substance and Sexual Activity   Alcohol use: No    Alcohol/week: 0.0 standard drinks   Drug use: No   Sexual activity: Yes  Other Topics Concern   Not on file  Social History Narrative   Not on file   Social Determinants of Health   Financial Resource Strain: Low Risk    Difficulty of Paying Living Expenses: Not hard at all  Food  Insecurity: No Food Insecurity   Worried About Charity fundraiser in the Last Year: Never true   [place] in the Last Year: Never true  Transportation Needs: No Transportation Needs   Lack of Transportation (Medical): No   Lack of Transportation (Non-Medical): No  Physical Activity: Sufficiently Active   Days of Exercise per Week: 5 days   Minutes of Exercise per Session: 30 min  Stress: No Stress Concern Present   Feeling of Stress : Not at all  Social Connections: Socially Integrated   Frequency of Communication with Friends and Family: More than three times a week   Frequency of Social Gatherings with Friends and Family: More than three times a week   Attends Religious Services: More than 4 times per year   Active Member of [employer] or Organizations: Yes   Attends Music therapist: More than 4 times per year   Marital Status: Married  Human resources officer Violence: Not At Risk   Fear of Current or Ex-Partner: No   Emotionally Abused: No   Physically Abused: No   Sexually Abused: No    FAMILY HISTORY:  Family History  Problem Relation Age of Onset   Other Mother    Heart attack Father    Breast cancer Paternal Aunt     CURRENT MEDICATIONS:  Outpatient Encounter Medications as of 07/04/2021  Medication Sig   ACCU-CHEK AVIVA PLUS test strip TESTING TWICE DAILY.   Acetaminophen (TYLENOL) 325 MG CAPS Take 1 capsule by mouth daily as needed (pain).   atorvastatin (LIPITOR) 10 MG tablet Take 10 mg by mouth daily.   B Complex-C-Biotin-E-Min-FA (DIALYVITE 5000) 5 MG TABS Take 1 tablet by mouth daily.   benazepril (LOTENSIN) 20 MG tablet Take 1 tablet by mouth 2 (two) times daily.   carvedilol (COREG) 3.125 MG tablet Take 3.125 mg by mouth 2 (two) times daily.   cloNIDine (CATAPRES) 0.1 MG tablet Take by mouth.   diclofenac Sodium (VOLTAREN) 1 % GEL Apply 2 g topically daily as needed (pain).   doxazosin (CARDURA) 2 MG tablet Take 2 mg by mouth at bedtime.    doxycycline (VIBRAMYCIN) 100 MG capsule Take 1 capsule (100 mg total) by mouth 2 (two) times daily.   Ferrous Sulfate 134 MG TABS Take 1 tablet (134 mg total) by mouth daily.   hydrALAZINE (APRESOLINE) 100 MG tablet Take 1 tablet (100 mg total) by mouth every 8 (eight) hours.   Insulin Glargine (LANTUS SOLOSTAR) 100 UNIT/ML Solostar Pen Inject 20 Units into the skin at bedtime.    Insulin Pen Needle 31G X 5 MM MISC As directed   isosorbide mononitrate (IMDUR) 30 MG 24 hr tablet Take 30 mg by mouth daily.   magnesium oxide (MAG-OX) 400 MG tablet Take 1 tablet by mouth daily.   MAGNESIUM-OXIDE 400 (241.3 Mg) MG tablet Take 1 tablet by mouth daily.  mirtazapine (REMERON) 7.5 MG tablet Take 7.5 mg by mouth at bedtime.   omeprazole (PRILOSEC) 40 MG capsule Take 40 mg by mouth daily.   potassium chloride (KLOR-CON) 10 MEQ tablet Take 10 mEq by mouth daily.   senna (SENOKOT) 8.6 MG tablet Take 2 tablets by mouth daily.   spironolactone (ALDACTONE) 25 MG tablet Take 1 tablet (25 mg total) by mouth daily.   torsemide (DEMADEX) 20 MG tablet Take 20 mg by mouth daily as needed (fluid).   No facility-administered encounter medications on file as of 07/04/2021.    ALLERGIES:  Allergies  Allergen Reactions   Levofloxacin Other (See Comments)    Throat Swelling    Penicillins Anaphylaxis   Other    Penicillin G Other (See Comments)   Sulfa Antibiotics      PHYSICAL EXAM:  ECOG PERFORMANCE STATUS: 2 - Symptomatic, <50% confined to bed  There were no vitals filed for this visit. There were no vitals filed for this visit. Physical Exam Constitutional:      Appearance: Normal appearance.  HENT:     Head: Normocephalic and atraumatic.     Mouth/Throat:     Mouth: Mucous membranes are moist.  Eyes:     Extraocular Movements: Extraocular movements intact.     Pupils: Pupils are equal, round, and reactive to light.  Cardiovascular:     Rate and Rhythm: Normal rate and regular rhythm.      Pulses: Normal pulses.     Heart sounds: Normal heart sounds.  Pulmonary:     Effort: Pulmonary effort is normal.     Breath sounds: Normal breath sounds.  Abdominal:     General: Bowel sounds are normal.     Palpations: Abdomen is soft.     Tenderness: There is no abdominal tenderness.  Musculoskeletal:        General: No swelling.     Right lower leg: No edema.     Left lower leg: No edema.  Lymphadenopathy:     Cervical: No cervical adenopathy.  Skin:    General: Skin is warm and dry.  Neurological:     General: No focal deficit present.     Mental Status: She is alert and oriented to person, place, and time.  Psychiatric:        Mood and Affect: Mood normal.        Behavior: Behavior normal.     LABORATORY DATA:  I have reviewed the labs as listed.  CBC    Component Value Date/Time   WBC 3.8 (L) 04/25/2021 1751   RBC 3.53 (L) 04/25/2021 1751   HGB 10.6 (L) 04/25/2021 1751   HCT 33.9 (L) 04/25/2021 1751   PLT 230 04/25/2021 1751   MCV 96.0 04/25/2021 1751   MCH 30.0 04/25/2021 1751   MCHC 31.3 04/25/2021 1751   RDW 17.6 (H) 04/25/2021 1751   LYMPHSABS 0.5 (L) 03/28/2021 1448   MONOABS 0.2 03/28/2021 1448   EOSABS 0.1 03/28/2021 1448   BASOSABS 0.0 03/28/2021 1448   CMP Latest Ref Rng & Units 04/25/2021 10/12/2020 09/09/2020  Glucose 70 - 99 mg/dL 213(H) - 208(H)  BUN 8 - 23 mg/dL 50(H) - 64(H)  Creatinine 0.44 - 1.00 mg/dL 7.27(H) - 5.95(H)  Sodium 135 - 145 mmol/L 136 - 138  Potassium 3.5 - 5.1 mmol/L 3.0(L) - 3.7  Chloride 98 - 111 mmol/L 101 - 100  CO2 22 - 32 mmol/L 26 - 23  Calcium 8.9 - 10.3 mg/dL 7.1(L) 7.5(L) 7.7(L)  Total Protein 6.5 - 8.1 g/dL - - -  Total Bilirubin 0.3 - 1.2 mg/dL - - -  Alkaline Phos 38 - 126 U/L - - -  AST 15 - 41 U/L - - -  ALT 0 - 44 U/L - - -    DIAGNOSTIC IMAGING:  I have independently reviewed the relevant imaging and discussed with the patient.  ASSESSMENT & PLAN: 1.  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 Juleen China for normocytic anemia, with decreased responsiveness to ESA. 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 - She has been receiving 36,000 units of Epogen every 2 weeks via her nephrologist. - Denies any bleeding per rectum or melena.  Stool occult blood was negative x3. - Received 2 units of PRBC in January, last transfusion on 09/08/2020.  Total 4 transfusions in the last 1 year. - At last visit, she was noted to have normal B12 but elevated methylmalonic acid, indicative of B12 deficiency, despite oral B12 supplementation - started on monthly B12 injections at last visit (04/04/2021).   - Most recent labs (07/04/2021): Hgb improved at 10.9/MCV 34.1.  Elevated ferritin 1303 with elevated iron saturation 40% and low TIBC.  LDH is normal.  CMP shows baseline ESRD. - PLAN: Anemia has improved after initiating B12 injections.  Previously noted mild leukopenia has also resolved with B12 repletion. - Continue Epogen via nephrologist - Repeat labs and RTC in 3 months.  2.  Vitamin B12 Deficiency - Started on monthly B12 injections on 04/04/2021 due to persistently elevated methylmalonic acid despite oral B12 supplementation - Most recent labs (07/04/2021) show B12 5175, may be falsely elevated as acute phase reactant.  Methylmalonic acid is pending. - PLAN: Continue monthly B12 injections.  Repeat B12/methylmalonic acid and RTC in 3 months.  3.  Monoclonal gammopathy: - Initial work-up (10/12/2020) showed M spike 0.1 with kappa light chains 217, lambda light chains 264 with a ratio of 0.82.  Immunofixation was negative.  LDH normalized. - Repeat SPEP (03/28/2021): Negative for M spike - PLAN: We will repeat MGUS panel in 3 months (January/February 2023).  4.  Weight loss - Patient reports decreased appetite after loss of her husband. - Offered to refer patient to dietitian at the cancer center, but she prefers to follow-up with dietitian  via her dialysis center - PLAN: Encouraged adequate nutrition.  We will continue to monitor weight at follow-up visits.   5.  Hypokalemia - Critical potassium 2.7 (07/04/2021). - Patient was given IV repletion (20 mEq) and oral supplementation (60 mEq) during visit today - PLAN: We will defer further management to her nephrologist, as patient has history of labile potassium with episodes of both hypokalemia and hyperkalemia.  Patient verbalized understanding reports that she will call her nephrologist tomorrow for further recommendations.  6.  Social/family history: - She worked as a fourth Stage manager.  Never smoker. - Paternal grandfather had stomach cancer and paternal aunt had breast cancer.    PLAN SUMMARY & DISPOSITION: - Monthly B12 injections - Repeat labs in 3 months (CBC, iron panel, B12/methylmalonic acid, MGUS/myeloma panel) - RTC 1 week after labs  All questions were answered. The patient knows to call the clinic with any problems, questions or concerns.  Medical decision making: Moderate  Time spent on visit: I spent 20 minutes counseling the patient face to face. The total time spent in the appointment was 30 minutes and more than 50% was on counseling.  Harriett Rush, PA-C  07/04/2021 6:42 PM

## 2021-07-04 ENCOUNTER — Other Ambulatory Visit: Payer: Self-pay

## 2021-07-04 ENCOUNTER — Inpatient Hospital Stay (HOSPITAL_COMMUNITY): Payer: Medicare PPO

## 2021-07-04 ENCOUNTER — Inpatient Hospital Stay (HOSPITAL_COMMUNITY): Payer: Medicare PPO | Attending: Physician Assistant | Admitting: Physician Assistant

## 2021-07-04 VITALS — BP 125/70 | HR 82 | Temp 97.0°F | Resp 20 | Wt 122.6 lb

## 2021-07-04 DIAGNOSIS — D472 Monoclonal gammopathy: Secondary | ICD-10-CM | POA: Diagnosis not present

## 2021-07-04 DIAGNOSIS — E876 Hypokalemia: Secondary | ICD-10-CM | POA: Diagnosis not present

## 2021-07-04 DIAGNOSIS — R630 Anorexia: Secondary | ICD-10-CM | POA: Diagnosis not present

## 2021-07-04 DIAGNOSIS — D72819 Decreased white blood cell count, unspecified: Secondary | ICD-10-CM | POA: Diagnosis not present

## 2021-07-04 DIAGNOSIS — Z9049 Acquired absence of other specified parts of digestive tract: Secondary | ICD-10-CM | POA: Diagnosis not present

## 2021-07-04 DIAGNOSIS — Z882 Allergy status to sulfonamides status: Secondary | ICD-10-CM | POA: Diagnosis not present

## 2021-07-04 DIAGNOSIS — E538 Deficiency of other specified B group vitamins: Secondary | ICD-10-CM | POA: Diagnosis not present

## 2021-07-04 DIAGNOSIS — Z880 Allergy status to penicillin: Secondary | ICD-10-CM | POA: Diagnosis not present

## 2021-07-04 DIAGNOSIS — R110 Nausea: Secondary | ICD-10-CM | POA: Diagnosis not present

## 2021-07-04 DIAGNOSIS — Z803 Family history of malignant neoplasm of breast: Secondary | ICD-10-CM | POA: Insufficient documentation

## 2021-07-04 DIAGNOSIS — F32A Depression, unspecified: Secondary | ICD-10-CM | POA: Diagnosis not present

## 2021-07-04 DIAGNOSIS — Z992 Dependence on renal dialysis: Secondary | ICD-10-CM | POA: Diagnosis not present

## 2021-07-04 DIAGNOSIS — Z881 Allergy status to other antibiotic agents status: Secondary | ICD-10-CM | POA: Insufficient documentation

## 2021-07-04 DIAGNOSIS — Z8249 Family history of ischemic heart disease and other diseases of the circulatory system: Secondary | ICD-10-CM | POA: Insufficient documentation

## 2021-07-04 DIAGNOSIS — R5383 Other fatigue: Secondary | ICD-10-CM | POA: Insufficient documentation

## 2021-07-04 DIAGNOSIS — N186 End stage renal disease: Secondary | ICD-10-CM | POA: Insufficient documentation

## 2021-07-04 DIAGNOSIS — R0602 Shortness of breath: Secondary | ICD-10-CM | POA: Insufficient documentation

## 2021-07-04 DIAGNOSIS — D631 Anemia in chronic kidney disease: Secondary | ICD-10-CM | POA: Diagnosis not present

## 2021-07-04 DIAGNOSIS — D649 Anemia, unspecified: Secondary | ICD-10-CM

## 2021-07-04 LAB — COMPREHENSIVE METABOLIC PANEL
ALT: 12 U/L (ref 0–44)
AST: 21 U/L (ref 15–41)
Albumin: 2.4 g/dL — ABNORMAL LOW (ref 3.5–5.0)
Alkaline Phosphatase: 72 U/L (ref 38–126)
Anion gap: 12 (ref 5–15)
BUN: 50 mg/dL — ABNORMAL HIGH (ref 8–23)
CO2: 26 mmol/L (ref 22–32)
Calcium: 7.8 mg/dL — ABNORMAL LOW (ref 8.9–10.3)
Chloride: 96 mmol/L — ABNORMAL LOW (ref 98–111)
Creatinine, Ser: 7.44 mg/dL — ABNORMAL HIGH (ref 0.44–1.00)
GFR, Estimated: 5 mL/min — ABNORMAL LOW (ref >60)
Glucose, Bld: 242 mg/dL — ABNORMAL HIGH (ref 70–99)
Potassium: 2.7 mmol/L — CL (ref 3.5–5.1)
Sodium: 134 mmol/L — ABNORMAL LOW (ref 135–145)
Total Bilirubin: 0.6 mg/dL (ref 0.3–1.2)
Total Protein: 6.6 g/dL (ref 6.5–8.1)

## 2021-07-04 LAB — CBC WITH DIFFERENTIAL/PLATELET
Abs Immature Granulocytes: 0.14 10*3/uL — ABNORMAL HIGH (ref 0.00–0.07)
Basophils Absolute: 0.1 10*3/uL (ref 0.0–0.1)
Basophils Relative: 1 %
Eosinophils Absolute: 0.1 10*3/uL (ref 0.0–0.5)
Eosinophils Relative: 1 %
HCT: 34.1 % — ABNORMAL LOW (ref 36.0–46.0)
Hemoglobin: 10.9 g/dL — ABNORMAL LOW (ref 12.0–15.0)
Immature Granulocytes: 2 %
Lymphocytes Relative: 10 %
Lymphs Abs: 0.6 10*3/uL — ABNORMAL LOW (ref 0.7–4.0)
MCH: 29.9 pg (ref 26.0–34.0)
MCHC: 32 g/dL (ref 30.0–36.0)
MCV: 93.4 fL (ref 80.0–100.0)
Monocytes Absolute: 0.3 10*3/uL (ref 0.1–1.0)
Monocytes Relative: 4 %
Neutro Abs: 5 10*3/uL (ref 1.7–7.7)
Neutrophils Relative %: 82 %
Platelets: 233 10*3/uL (ref 150–400)
RBC: 3.65 MIL/uL — ABNORMAL LOW (ref 3.87–5.11)
RDW: 17.7 % — ABNORMAL HIGH (ref 11.5–15.5)
WBC: 6.1 10*3/uL (ref 4.0–10.5)
nRBC: 0 % (ref 0.0–0.2)

## 2021-07-04 LAB — IRON AND TIBC
Iron: 73 ug/dL (ref 28–170)
Saturation Ratios: 40 % — ABNORMAL HIGH (ref 10.4–31.8)
TIBC: 183 ug/dL — ABNORMAL LOW (ref 250–450)
UIBC: 110 ug/dL

## 2021-07-04 LAB — VITAMIN B12: Vitamin B-12: 5175 pg/mL — ABNORMAL HIGH (ref 180–914)

## 2021-07-04 LAB — FERRITIN: Ferritin: 1303 ng/mL — ABNORMAL HIGH (ref 11–307)

## 2021-07-04 LAB — LACTATE DEHYDROGENASE: LDH: 216 U/L — ABNORMAL HIGH (ref 98–192)

## 2021-07-04 MED ORDER — POTASSIUM CHLORIDE IN NACL 40-0.9 MEQ/L-% IV SOLN
Freq: Once | INTRAVENOUS | Status: AC
  Filled 2021-07-04: qty 1000

## 2021-07-04 MED ORDER — POTASSIUM CHLORIDE CRYS ER 20 MEQ PO TBCR
40.0000 meq | EXTENDED_RELEASE_TABLET | Freq: Once | ORAL | Status: AC
  Administered 2021-07-04: 40 meq via ORAL
  Filled 2021-07-04: qty 2

## 2021-07-04 MED ORDER — CYANOCOBALAMIN 1000 MCG/ML IJ SOLN
1000.0000 ug | Freq: Once | INTRAMUSCULAR | Status: AC
  Administered 2021-07-04: 1000 ug via INTRAMUSCULAR
  Filled 2021-07-04: qty 1

## 2021-07-04 MED ORDER — POTASSIUM CHLORIDE CRYS ER 20 MEQ PO TBCR
20.0000 meq | EXTENDED_RELEASE_TABLET | Freq: Once | ORAL | Status: AC
  Administered 2021-07-04: 20 meq via ORAL
  Filled 2021-07-04: qty 1

## 2021-07-04 MED ORDER — POTASSIUM CHLORIDE CRYS ER 20 MEQ PO TBCR: 40.0000 meq | EXTENDED_RELEASE_TABLET | ORAL | Status: DC

## 2021-07-04 NOTE — Progress Notes (Signed)
CRITICAL VALUE ALERT Critical value received:  K+ 2.7 Date of notification:  07-04-21 Time of notification: 2878 Critical value read back:  Yes.   Nurse who received alert:  C.Trinia Georgi RN MD notified time and response:  6767- see orders.  Potassium with ivf given per MD. B12 injection and oral K+ supplements given per orders. Patient tolerated it well without problems. Vitals stable and discharged home from clinic ambulatory. Follow up as scheduled.

## 2021-07-04 NOTE — Progress Notes (Signed)
Potassium level 2.7  Orders received for 1 liter NS + 40 meq Potassium chloride over 4 hours and Potassium chloride oral 40 meq orally x 1 dose  T.O. Rebehah Pennington PA-C/Jihad Brownlow Ronnald Ramp, PharmD

## 2021-07-04 NOTE — Patient Instructions (Signed)
[place] at [facility] Discharge Instructions  You were seen today by Tarri Abernethy PA-C for your anemia and B12 deficiency.  Your blood levels have improved after being started on B12 supplementation.  We will continue vitamin B12 injections once per month.  Regarding your LOW POTASSIUM, please CONTACT YOUR KIDNEY DOCTOR FOR FURTHER ADVICE.  Your potassium was 2.7 when we checked it today (07/04/2021).  You were given 40 mEq of IV potassium as well as 40 mEq of oral potassium during your visit today.  Regarding your low appetite and weight loss, please reach out to the dietitian at your nephrologist's office, as they can offer nutritional advice that is more specifically tailored to your chronic kidney disease and dialysis needs.  LABS: Return in 3 months for repeat labs  TREATMENT: Monthly B12 injections  FOLLOW-UP APPOINTMENT: Office visit in 3 months, after labs   Thank you for choosing [place] at [facility] to provide your oncology and hematology care.  To afford each patient quality time with our provider, please arrive at least 15 minutes before your scheduled appointment time.   If you have a lab appointment with the [place] please come in thru the Main Entrance and check in at the main information desk.  You need to re-schedule your appointment should you arrive 10 or more minutes late.  We strive to give you quality time with our providers, and arriving late affects you and other patients whose appointments are after yours.  Also, if you no show three or more times for appointments you may be dismissed from the clinic at the providers discretion.     Again, thank you for choosing [facility].  Our hope is that these requests will decrease the amount of time that you wait before being seen by our physicians.       _____________________________________________________________  Should you have questions  after your visit to [facility], please contact our office at [phone] and follow the prompts.  Our office hours are 8:00 a.m. and 4:30 p.m. Monday - Friday.  Please note that voicemails left after 4:00 p.m. may not be returned until the following business day.  We are closed weekends and major holidays.  You do have access to a nurse 24-7, just call the main number to the clinic [phone] and do not press any options, hold on the line and a nurse will answer the phone.    For prescription refill requests, have your pharmacy contact our office and allow 72 hours.    Due to Covid, you will need to wear a mask upon entering the hospital. If you do not have a mask, a mask will be given to you at the Main Entrance upon arrival. For doctor visits, patients may have 1 support person age 76 or older with them. For treatment visits, patients can not have anyone with them due to social distancing guidelines and our immunocompromised population.

## 2021-07-04 NOTE — Patient Instructions (Signed)
[facility]  Discharge Instructions: Thank you for choosing [facility] to provide your oncology and hematology care.  If you have a lab appointment with the Cancer Center, please come in thru the Main Entrance and check in at the main information desk.  Wear comfortable clothing and clothing appropriate for easy access to any Portacath or PICC line.   We strive to give you quality time with your provider. You may need to reschedule your appointment if you arrive late (15 or more minutes).  Arriving late affects you and other patients whose appointments are after yours.  Also, if you miss three or more appointments without notifying the office, you may be dismissed from the clinic at the provider's discretion.      For prescription refill requests, have your pharmacy contact our office and allow 72 hours for refills to be completed.        To help prevent nausea and vomiting after your treatment, we encourage you to take your nausea medication as directed.  BELOW ARE SYMPTOMS THAT SHOULD BE REPORTED IMMEDIATELY: *FEVER GREATER THAN 100.4 F (38 C) OR HIGHER *CHILLS OR SWEATING *NAUSEA AND VOMITING THAT IS NOT CONTROLLED WITH YOUR NAUSEA MEDICATION *UNUSUAL SHORTNESS OF BREATH *UNUSUAL BRUISING OR BLEEDING *URINARY PROBLEMS (pain or burning when urinating, or frequent urination) *BOWEL PROBLEMS (unusual diarrhea, constipation, pain near the anus) TENDERNESS IN MOUTH AND THROAT WITH OR WITHOUT PRESENCE OF ULCERS (sore throat, sores in mouth, or a toothache) UNUSUAL RASH, SWELLING OR PAIN  UNUSUAL VAGINAL DISCHARGE OR ITCHING   Items with * indicate a potential emergency and should be followed up as soon as possible or go to the Emergency Department if any problems should occur.  Please show the CHEMOTHERAPY ALERT CARD or IMMUNOTHERAPY ALERT CARD at check-in to the Emergency Department and triage nurse.  Should you have questions after your visit or need to cancel  or reschedule your appointment, please contact [facility] [phone]  and follow the prompts.  Office hours are 8:00 a.m. to 4:30 p.m. Monday - Friday. Please note that voicemails left after 4:00 p.m. may not be returned until the following business day.  We are closed weekends and major holidays. You have access to a nurse at all times for urgent questions. Please call the main number to the clinic [phone] and follow the prompts.  For any non-urgent questions, you may also contact your provider using MyChart. We now offer e-Visits for anyone 18 and older to request care online for non-urgent symptoms. For details visit [email or web address].   Also download the MyChart app! Go to the app store, search "MyChart", open the app, select [place], and log in with your MyChart username and password.  Due to Covid, a mask is required upon entering the hospital/clinic. If you do not have a mask, one will be given to you upon arrival. For doctor visits, patients may have 1 support person aged 18 or older with them. For treatment visits, patients cannot have anyone with them due to current Covid guidelines and our immunocompromised population.  

## 2021-07-08 LAB — METHYLMALONIC ACID, SERUM: Methylmalonic Acid, Quantitative: 529 nmol/L — ABNORMAL HIGH (ref 0–378)

## 2021-07-13 IMAGING — MG MM DIGITAL DIAGNOSTIC UNILAT*R* W/ TOMO W/ CAD
6 series · 6 of 18 positions shown · non-contrast
Comparison: Previous exam(s).

CLINICAL DATA: 80-year-old female for further evaluation of
possible RIGHT breast mass on screening mammogram.

EXAM:
DIGITAL DIAGNOSTIC UNILATERAL RIGHT MAMMOGRAM WITH TOMOSYNTHESIS AND
CAD
TECHNIQUE: Right digital diagnostic mammography and breast tomosynthesis was
performed. The images were evaluated with computer-aided detection.

[R MLO synth-2D]
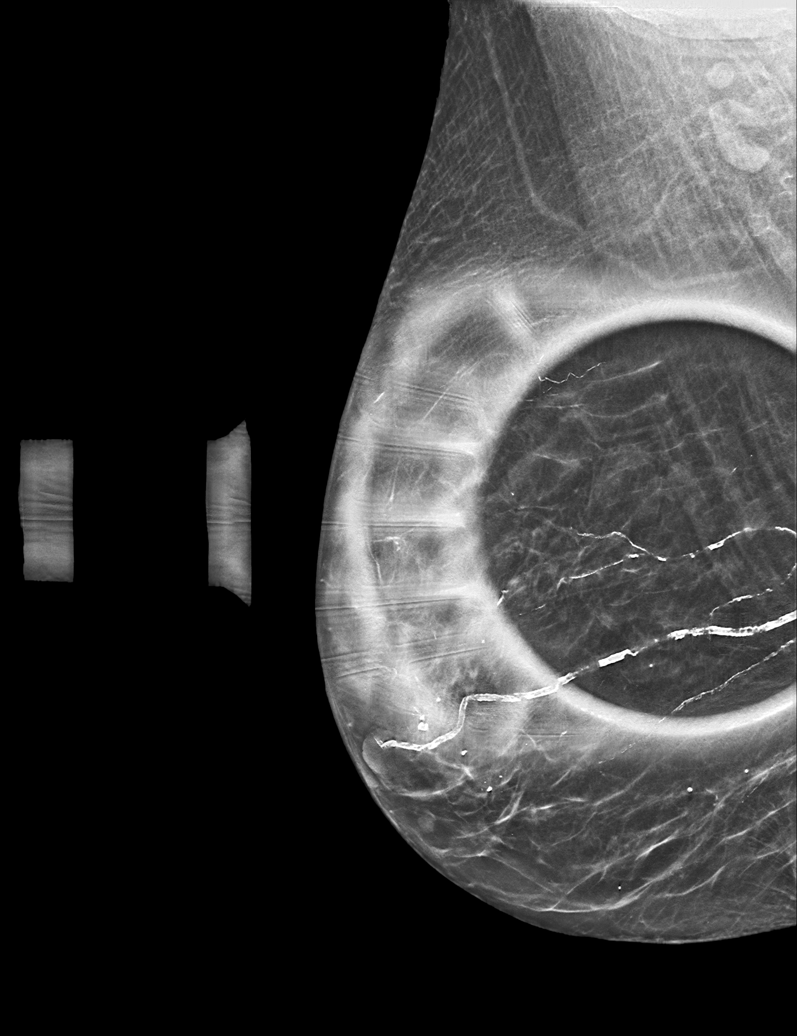

[R CC synth-2D]
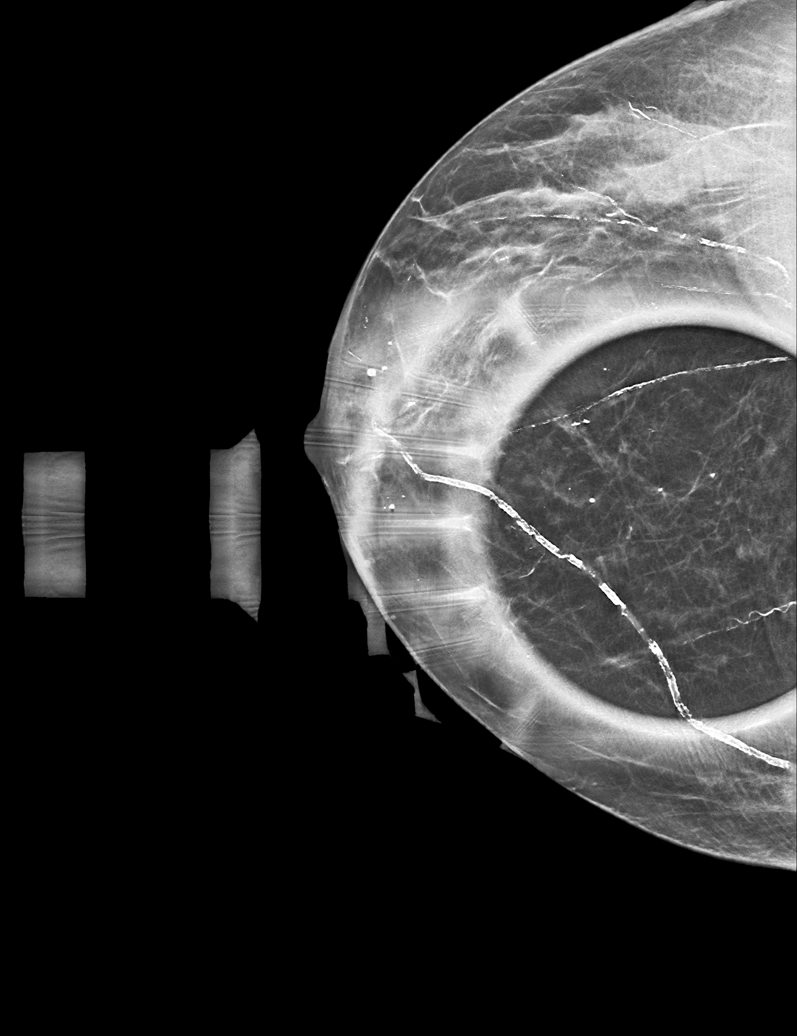

[R ML synth-2D]
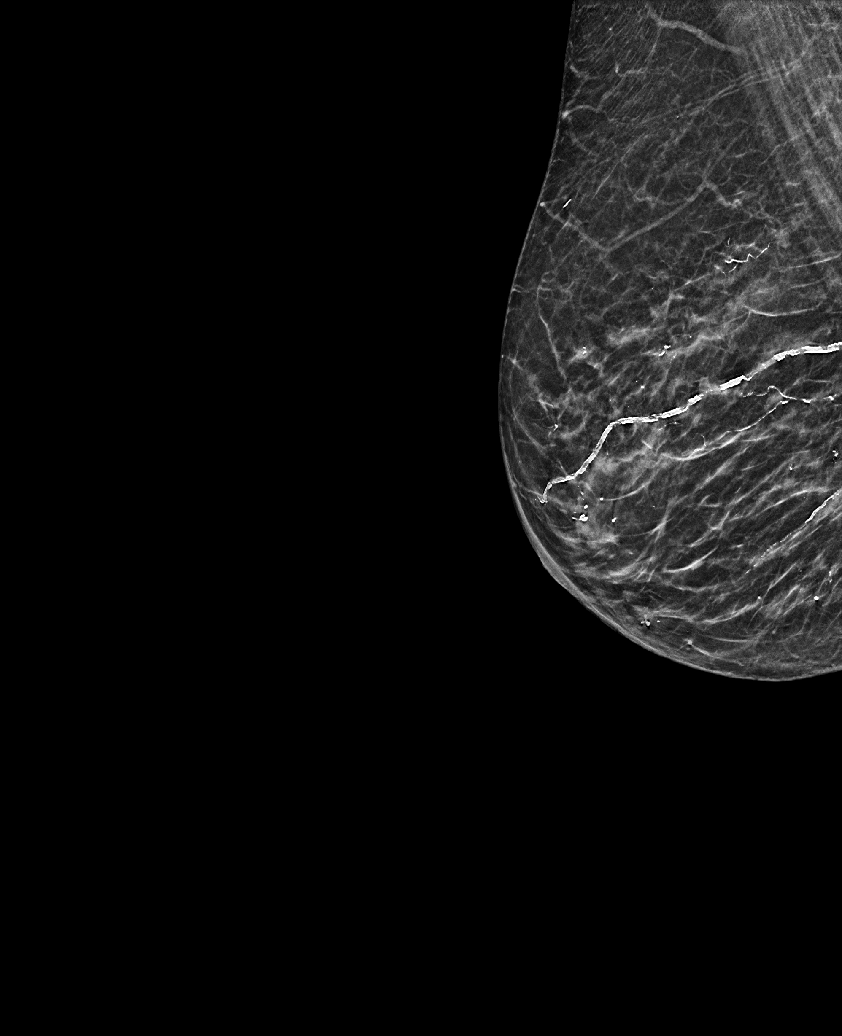

[R MLO tomo · tomo slice 23/44.0]
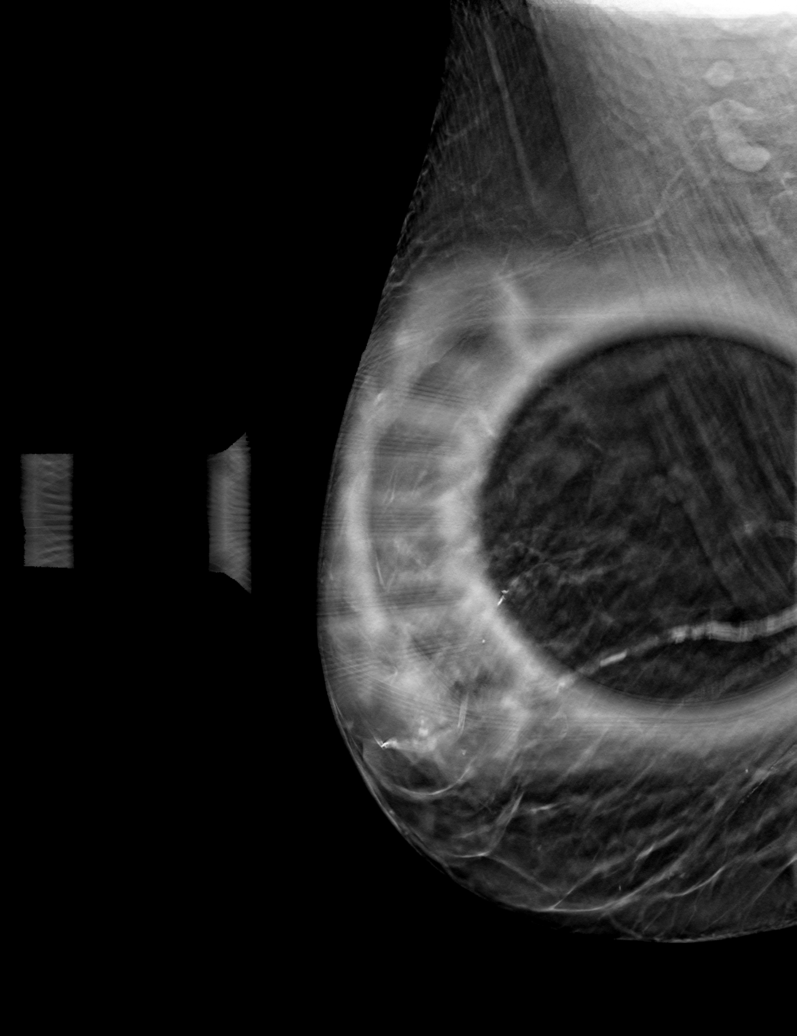

[R CC tomo · tomo slice 21/40.0]
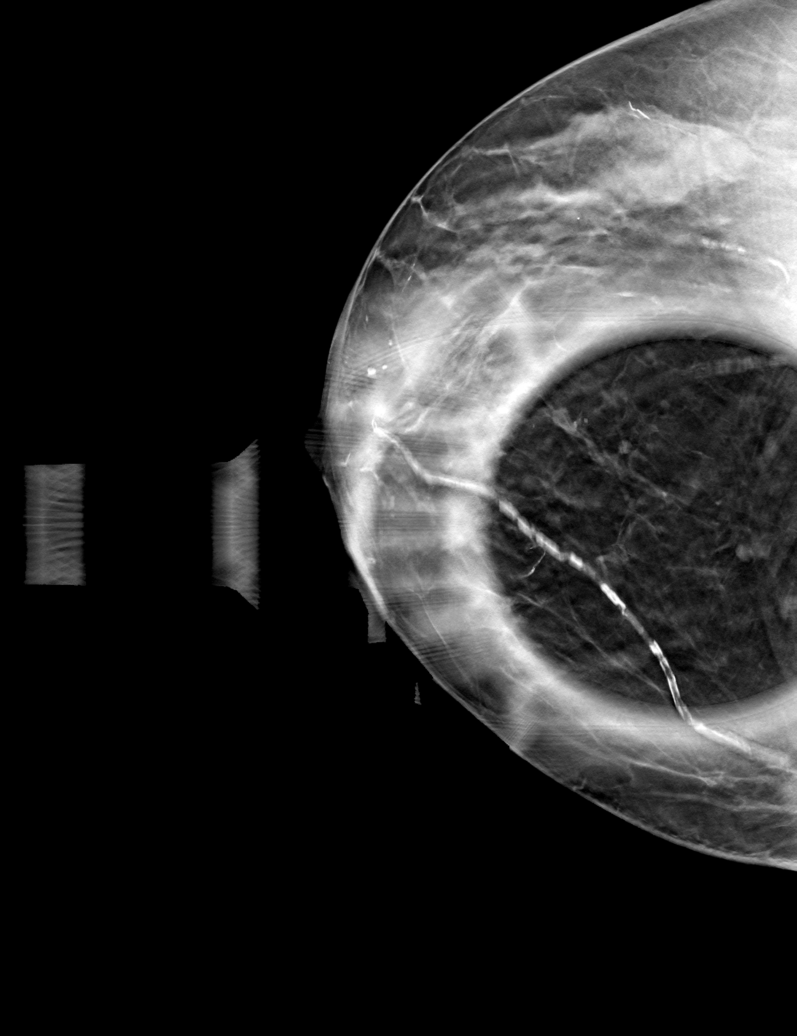

[R ML tomo · tomo slice 18/35.0]
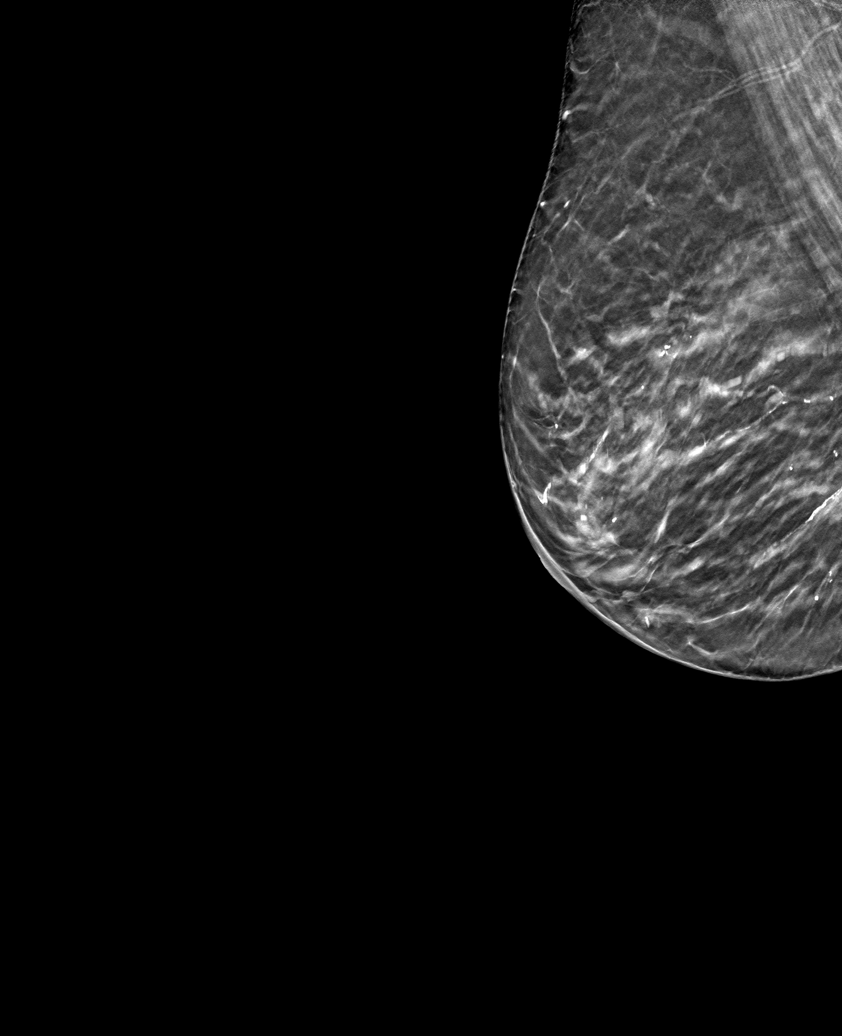

[6 of 18 positions shown; findings below may reference images not displayed]

ACR Breast Density Category c: The breast tissue is heterogeneously
dense, which may obscure small masses.
FINDINGS: 2D/3D full field and spot compression views of the RIGHT breast
demonstrate no persistent suspicious abnormality at the site of the
screening study finding. On today's images, this area has a similar
appearance to remote studies.
IMPRESSION: No persistent suspicious abnormality in the area of the screening
study finding.

RECOMMENDATION:
Bilateral screening mammogram in 1 year as clinically indicated.

I have discussed the findings and recommendations with the patient.
If applicable, a reminder letter will be sent to the patient
regarding the next appointment.

BI-RADS CATEGORY  1: Negative.

## 2021-07-20 ENCOUNTER — Observation Stay
Admission: EM | Admit: 2021-07-20 | Discharge: 2021-07-23 | Disposition: A | Discharge status: Home / Self Care | Payer: Medicare PPO | Attending: Internal Medicine | Admitting: Internal Medicine

## 2021-07-20 ENCOUNTER — Encounter: Payer: Self-pay | Admitting: Emergency Medicine

## 2021-07-20 ENCOUNTER — Other Ambulatory Visit: Payer: Self-pay

## 2021-07-20 DIAGNOSIS — Z79899 Other long term (current) drug therapy: Secondary | ICD-10-CM | POA: Diagnosis not present

## 2021-07-20 DIAGNOSIS — S82892A Other fracture of left lower leg, initial encounter for closed fracture: Secondary | ICD-10-CM

## 2021-07-20 DIAGNOSIS — Z20822 Contact with and (suspected) exposure to covid-19: Secondary | ICD-10-CM | POA: Insufficient documentation

## 2021-07-20 DIAGNOSIS — I120 Hypertensive chronic kidney disease with stage 5 chronic kidney disease or end stage renal disease: Secondary | ICD-10-CM | POA: Insufficient documentation

## 2021-07-20 DIAGNOSIS — N186 End stage renal disease: Secondary | ICD-10-CM | POA: Diagnosis not present

## 2021-07-20 DIAGNOSIS — Z992 Dependence on renal dialysis: Secondary | ICD-10-CM | POA: Diagnosis present

## 2021-07-20 DIAGNOSIS — L03311 Cellulitis of abdominal wall: Secondary | ICD-10-CM | POA: Diagnosis not present

## 2021-07-20 DIAGNOSIS — T85611A Breakdown (mechanical) of intraperitoneal dialysis catheter, initial encounter: Secondary | ICD-10-CM | POA: Diagnosis not present

## 2021-07-20 DIAGNOSIS — R809 Proteinuria, unspecified: Secondary | ICD-10-CM | POA: Diagnosis present

## 2021-07-20 DIAGNOSIS — E119 Type 2 diabetes mellitus without complications: Secondary | ICD-10-CM | POA: Diagnosis not present

## 2021-07-20 DIAGNOSIS — K219 Gastro-esophageal reflux disease without esophagitis: Secondary | ICD-10-CM | POA: Diagnosis present

## 2021-07-20 DIAGNOSIS — E538 Deficiency of other specified B group vitamins: Secondary | ICD-10-CM | POA: Diagnosis present

## 2021-07-20 DIAGNOSIS — L0390 Cellulitis, unspecified: Secondary | ICD-10-CM | POA: Diagnosis present

## 2021-07-20 DIAGNOSIS — Z794 Long term (current) use of insulin: Secondary | ICD-10-CM | POA: Diagnosis not present

## 2021-07-20 DIAGNOSIS — D649 Anemia, unspecified: Secondary | ICD-10-CM | POA: Diagnosis present

## 2021-07-20 DIAGNOSIS — E785 Hyperlipidemia, unspecified: Secondary | ICD-10-CM | POA: Diagnosis present

## 2021-07-20 DIAGNOSIS — I10 Essential (primary) hypertension: Secondary | ICD-10-CM | POA: Diagnosis present

## 2021-07-20 LAB — HEMOGLOBIN A1C
Hgb A1c MFr Bld: 6.1 % — ABNORMAL HIGH (ref 4.8–5.6)
Mean Plasma Glucose: 128.37 mg/dL

## 2021-07-20 LAB — URINALYSIS, ROUTINE W REFLEX MICROSCOPIC
Bilirubin Urine: NEGATIVE
Glucose, UA: 150 mg/dL — AB
Ketones, ur: NEGATIVE mg/dL
Nitrite: NEGATIVE
Protein, ur: 100 mg/dL — AB
Specific Gravity, Urine: 1.016 (ref 1.005–1.030)
WBC, UA: >50 WBC/hpf — ABNORMAL HIGH (ref 0–5)
pH: 7 (ref 5.0–8.0)

## 2021-07-20 LAB — MRSA NEXT GEN BY PCR, NASAL: MRSA by PCR Next Gen: DETECTED — AB

## 2021-07-20 LAB — COMPREHENSIVE METABOLIC PANEL
ALT: 13 U/L (ref 0–44)
AST: 24 U/L (ref 15–41)
Albumin: 2.4 g/dL — ABNORMAL LOW (ref 3.5–5.0)
Alkaline Phosphatase: 66 U/L (ref 38–126)
Anion gap: 11 (ref 5–15)
BUN: 53 mg/dL — ABNORMAL HIGH (ref 8–23)
CO2: 26 mmol/L (ref 22–32)
Calcium: 8 mg/dL — ABNORMAL LOW (ref 8.9–10.3)
Chloride: 99 mmol/L (ref 98–111)
Creatinine, Ser: 7.68 mg/dL — ABNORMAL HIGH (ref 0.44–1.00)
GFR, Estimated: 5 mL/min — ABNORMAL LOW (ref >60)
Glucose, Bld: 304 mg/dL — ABNORMAL HIGH (ref 70–99)
Potassium: 3.7 mmol/L (ref 3.5–5.1)
Sodium: 136 mmol/L (ref 135–145)
Total Bilirubin: 0.9 mg/dL (ref 0.3–1.2)
Total Protein: 7 g/dL (ref 6.5–8.1)

## 2021-07-20 LAB — CBC WITH DIFFERENTIAL/PLATELET
Abs Immature Granulocytes: 0.07 10*3/uL (ref 0.00–0.07)
Basophils Absolute: 0.1 10*3/uL (ref 0.0–0.1)
Basophils Relative: 1 %
Eosinophils Absolute: 0.1 10*3/uL (ref 0.0–0.5)
Eosinophils Relative: 1 %
HCT: 28.8 % — ABNORMAL LOW (ref 36.0–46.0)
Hemoglobin: 8.9 g/dL — ABNORMAL LOW (ref 12.0–15.0)
Immature Granulocytes: 1 %
Lymphocytes Relative: 7 %
Lymphs Abs: 0.4 10*3/uL — ABNORMAL LOW (ref 0.7–4.0)
MCH: 28.7 pg (ref 26.0–34.0)
MCHC: 30.9 g/dL (ref 30.0–36.0)
MCV: 92.9 fL (ref 80.0–100.0)
Monocytes Absolute: 0.2 10*3/uL (ref 0.1–1.0)
Monocytes Relative: 4 %
Neutro Abs: 4.8 10*3/uL (ref 1.7–7.7)
Neutrophils Relative %: 86 %
Platelets: 240 10*3/uL (ref 150–400)
RBC: 3.1 MIL/uL — ABNORMAL LOW (ref 3.87–5.11)
RDW: 17.6 % — ABNORMAL HIGH (ref 11.5–15.5)
WBC: 5.6 10*3/uL (ref 4.0–10.5)
nRBC: 0 % (ref 0.0–0.2)

## 2021-07-20 LAB — CBG MONITORING, ED: Glucose-Capillary: 106 mg/dL — ABNORMAL HIGH (ref 70–99)

## 2021-07-20 LAB — RESP PANEL BY RT-PCR (FLU A&B, COVID) ARPGX2
Influenza A by PCR: NEGATIVE
Influenza B by PCR: NEGATIVE
SARS Coronavirus 2 by RT PCR: NEGATIVE

## 2021-07-20 LAB — LACTIC ACID, PLASMA
Lactic Acid, Venous: 2.4 mmol/L (ref 0.5–1.9)
Lactic Acid, Venous: 1.7 mmol/L (ref 0.5–1.9)

## 2021-07-20 MED ORDER — MAGNESIUM OXIDE -MG SUPPLEMENT 400 (240 MG) MG PO TABS
400.0000 mg | ORAL_TABLET | Freq: Every day | ORAL | Status: DC
  Administered 2021-07-21 – 2021-07-23 (×3): 400 mg via ORAL
  Filled 2021-07-20 (×3): qty 1

## 2021-07-20 MED ORDER — CLONIDINE HCL 0.1 MG PO TABS
0.1000 mg | ORAL_TABLET | Freq: Every day | ORAL | Status: DC
  Administered 2021-07-21 – 2021-07-23 (×3): 0.1 mg via ORAL
  Filled 2021-07-20 (×4): qty 1

## 2021-07-20 MED ORDER — VANCOMYCIN VARIABLE DOSE PER UNSTABLE RENAL FUNCTION (PHARMACIST DOSING): Status: DC

## 2021-07-20 MED ORDER — ATORVASTATIN CALCIUM 10 MG PO TABS
10.0000 mg | ORAL_TABLET | Freq: Every day | ORAL | Status: DC
  Administered 2021-07-20 – 2021-07-22 (×3): 10 mg via ORAL
  Filled 2021-07-20 (×3): qty 1

## 2021-07-20 MED ORDER — RENA-VITE PO TABS
1.0000 | ORAL_TABLET | Freq: Every day | ORAL | Status: DC
  Administered 2021-07-21 – 2021-07-23 (×3): 1 via ORAL
  Filled 2021-07-20 (×3): qty 1

## 2021-07-20 MED ORDER — INSULIN ASPART 100 UNIT/ML IJ SOLN
0.0000 [IU] | Freq: Three times a day (TID) | INTRAMUSCULAR | Status: DC
  Administered 2021-07-22: 2 [IU] via SUBCUTANEOUS
  Administered 2021-07-22: 1 [IU] via SUBCUTANEOUS
  Filled 2021-07-20 (×2): qty 1

## 2021-07-20 MED ORDER — AMLODIPINE BESYLATE 5 MG PO TABS
2.5000 mg | ORAL_TABLET | Freq: Every day | ORAL | Status: DC
  Administered 2021-07-21 – 2021-07-23 (×3): 2.5 mg via ORAL
  Filled 2021-07-20 (×4): qty 1

## 2021-07-20 MED ORDER — SODIUM CHLORIDE 0.9 % IV SOLN
1.0000 g | Freq: Once | INTRAVENOUS | Status: AC
  Administered 2021-07-20: 1 g via INTRAVENOUS
  Filled 2021-07-20: qty 1

## 2021-07-20 MED ORDER — INSULIN ASPART 100 UNIT/ML IJ SOLN: 0.0000 [IU] | Freq: Every day | INTRAMUSCULAR | Status: DC

## 2021-07-20 MED ORDER — ISOSORBIDE MONONITRATE ER 30 MG PO TB24
30.0000 mg | ORAL_TABLET | Freq: Every day | ORAL | Status: DC
  Administered 2021-07-21 – 2021-07-23 (×3): 30 mg via ORAL
  Filled 2021-07-20 (×4): qty 1

## 2021-07-20 MED ORDER — INSULIN GLARGINE 100 UNIT/ML ~~LOC~~ SOLN
20.0000 [IU] | Freq: Every day | SUBCUTANEOUS | Status: DC
  Filled 2021-07-20: qty 0.2

## 2021-07-20 MED ORDER — SENNA 8.6 MG PO TABS
2.0000 | ORAL_TABLET | Freq: Every day | ORAL | Status: DC
  Administered 2021-07-21 – 2021-07-23 (×3): 17.2 mg via ORAL
  Filled 2021-07-20 (×3): qty 2

## 2021-07-20 MED ORDER — ACETAMINOPHEN 325 MG PO TABS: 650.0000 mg | ORAL_TABLET | Freq: Four times a day (QID) | ORAL | Status: DC | PRN

## 2021-07-20 MED ORDER — HEPARIN SODIUM (PORCINE) 5000 UNIT/ML IJ SOLN
5000.0000 [IU] | Freq: Three times a day (TID) | INTRAMUSCULAR | Status: DC
  Administered 2021-07-20 – 2021-07-23 (×8): 5000 [IU] via SUBCUTANEOUS
  Filled 2021-07-20 (×7): qty 1

## 2021-07-20 MED ORDER — SODIUM CHLORIDE 0.9 % IV SOLN
1.0000 g | INTRAVENOUS | Status: DC
  Administered 2021-07-22: 1 g via INTRAVENOUS
  Filled 2021-07-20 (×2): qty 1

## 2021-07-20 MED ORDER — ACETAMINOPHEN 650 MG RE SUPP
650.0000 mg | Freq: Four times a day (QID) | RECTAL | Status: DC | PRN
  Filled 2021-07-20: qty 1

## 2021-07-20 MED ORDER — ONDANSETRON HCL 4 MG/2ML IJ SOLN: 4.0000 mg | Freq: Four times a day (QID) | INTRAMUSCULAR | Status: DC | PRN

## 2021-07-20 MED ORDER — IRBESARTAN 150 MG PO TABS
150.0000 mg | ORAL_TABLET | Freq: Every day | ORAL | Status: DC
  Administered 2021-07-21 – 2021-07-23 (×3): 150 mg via ORAL
  Filled 2021-07-20 (×3): qty 1

## 2021-07-20 MED ORDER — ONDANSETRON HCL 4 MG PO TABS: 4.0000 mg | ORAL_TABLET | Freq: Four times a day (QID) | ORAL | Status: DC | PRN

## 2021-07-20 MED ORDER — VANCOMYCIN HCL 1250 MG/250ML IV SOLN
1250.0000 mg | Freq: Once | INTRAVENOUS | Status: AC
  Administered 2021-07-20: 1250 mg via INTRAVENOUS
  Filled 2021-07-20: qty 250

## 2021-07-20 NOTE — Progress Notes (Signed)
PHARMACY -  BRIEF ANTIBIOTIC NOTE   Pharmacy has received consult(s) for vancomycin from an ED provider.  The patient's profile has been reviewed for ht/wt/allergies/indication/available labs.    One time order(s) placed for vancomycin 1250 mg x 1.  Further antibiotics/pharmacy consults should be ordered by admitting physician if indicated.                       Thank you, Wynelle Cleveland, PharmD Pharmacy Resident  07/20/2021 6:52 PM

## 2021-07-20 NOTE — ED Triage Notes (Signed)
Pt comes into the ED via POV c/o dialysis access problem.  PT states that she has an infection at her peritoneal dialysis site. Pt has redness and heat coming from the site.  Pt in NAD at this time with even and unlabored respirations.  Pt states her last dialysis through the site was last night.

## 2021-07-20 NOTE — ED Provider Notes (Signed)
[facility] Emergency Department Provider Note    Event Date/Time   First MD Initiated Contact with Patient 07/20/21 1836     (approximate)  I have reviewed the triage vital signs and the nursing notes.   HISTORY  Chief Complaint Vascular Access Problem    HPI Catherine Burke is a 80 y.o. female with end-stage renal disease on peritoneal dialysis presents to the ER for evaluation of concern for displaced peritoneal dialysis catheter with redness and pain around the insertion site.  States that she was on the rocker over the weekend and the catheter got looped around her leg and when she moved she felt it pop and pull.  The PD catheter is still functioning she was able to dialyze last night but she is developing worsening pain and redness at the insertion site with some drainage.  She went to dialysis clinic today where peritoneal fluid was cultured and she was sent to the ER for further evaluation.  No measured fevers but has had some generalized abdominal pain but mostly right at the location of the insertion site.  Does have a listed allergy to penicillin but has been able to tolerate Rocephin in the past.  Past Medical History:  Diagnosis Date   Anemia    Arthritis    B12 deficiency 04/04/2021   Diabetes mellitus    GERD (gastroesophageal reflux disease)    HLD (hyperlipidemia)    Hypercholesterolemia    Hypertension    Kidney stones    Osteoporosis    Proteinuria    Family History  Problem Relation Age of Onset   Other Mother    Heart attack Father    Breast cancer Paternal Aunt    Past Surgical History:  Procedure Laterality Date   ABDOMINAL HYSTERECTOMY     BACK SURGERY     lumbar   BREAST CYST ASPIRATION     unsure of laterality    CAPD INSERTION N/A 02/13/2018   Procedure: LAPAROSCOPIC INSERTION CONTINUOUS AMBULATORY PERITONEAL DIALYSIS  (CAPD) CATHETER;  Surgeon: Algernon Huxley, MD;  Location: [facility] ORS;  Service: Vascular;  Laterality: N/A;    CHOLECYSTECTOMY     EYE SURGERY     bilateral cataract    NASAL SEPTUM SURGERY     Patient Active Problem List   Diagnosis Date Noted   Cellulitis 07/20/2021   Abdominal wall cellulitis 07/20/2021   B12 deficiency 04/04/2021   Symptomatic anemia 07/22/2020   Acute on chronic anemia 07/21/2020   ESRD (end stage renal disease) ([place]) 07/21/2020   Peritoneal dialysis status ([place]) 07/21/2020   Chronic kidney disease (CKD), stage IV (severe) ([place]) 02/08/2018   Proteinuria 10/10/2017   Chronic kidney disease (CKD), stage III (moderate) ([place]) 02/24/2015   Kidney stones    Osteoporosis    GERD (gastroesophageal reflux disease)    Diabetes mellitus ([place]) 01/17/2007   Hyperlipidemia 01/17/2007   Essential hypertension 01/17/2007   OSTEOPENIA 01/17/2007   HYSTERECTOMY, PARTIAL, HX OF 01/17/2007      Prior to Admission medications   Medication Sig Start Date End Date Taking? Authorizing Provider  ACCU-CHEK AVIVA PLUS test strip TESTING TWICE DAILY. 08/11/14   Susy Frizzle, MD  Acetaminophen (TYLENOL) 325 MG CAPS Take 1 capsule by mouth daily as needed (pain).    [provider]  APAP-MAG SALICYLATE-CAFFEINE PO  once 03/17/20   [provider]  atorvastatin (LIPITOR) 10 MG tablet Take 10 mg by mouth daily. 07/28/20   [provider]  B Complex-C-Biotin-E-Min-FA (DIALYVITE 5000) 5 MG TABS Take 1 tablet by mouth daily. 10/27/20   [provider]  carvedilol (COREG) 3.125 MG tablet Take 3.125 mg by mouth 2 (two) times daily. 10/27/20   [provider]  cephALEXin (KEFLEX) 250 MG capsule Take by mouth 2 (two) times daily. 06/21/21   [provider]  cetirizine (ZYRTEC) 10 MG tablet Take by mouth. 06/09/21   [provider]  cloNIDine (CATAPRES) 0.1 MG tablet Take by mouth. 07/19/20   [provider]  cloNIDine (CATAPRES) 0.1 MG tablet Take by mouth. 06/09/21   [provider]  diclofenac Sodium (VOLTAREN) 1 %  GEL Apply 2 g topically daily as needed (pain). Patient not taking: Reported on 07/04/2021    [provider]  doxazosin (CARDURA) 2 MG tablet Take 2 mg by mouth at bedtime. 11/23/20   [provider]  doxycycline (VIBRAMYCIN) 100 MG capsule Take 1 capsule (100 mg total) by mouth 2 (two) times daily. 12/07/20   Rodriguez-Southworth, Sunday Spillers, PA-C  Ferrous Sulfate 134 MG TABS Take 1 tablet (134 mg total) by mouth daily. 09/09/20   Annita Brod, MD  hydrALAZINE (APRESOLINE) 100 MG tablet Take 1 tablet (100 mg total) by mouth every 8 (eight) hours. 10/13/17 07/21/20  Henreitta Leber, MD  Insulin Glargine (LANTUS SOLOSTAR) 100 UNIT/ML Solostar Pen Inject 20 Units into the skin at bedtime.  12/11/18   [provider]  Insulin Pen Needle 31G X 5 MM MISC As directed 03/31/15   Alycia Rossetti, MD  isosorbide mononitrate (IMDUR) 30 MG 24 hr tablet Take 30 mg by mouth daily. 08/12/20   [provider]  magnesium oxide (MAG-OX) 400 MG tablet Take 1 tablet by mouth daily. 10/13/20   [provider]  magnesium oxide (MAG-OX) 400 MG tablet Take by mouth. 06/09/21   [provider]  MAGNESIUM-OXIDE 400 (241.3 Mg) MG tablet Take 1 tablet by mouth daily. 12/03/20   [provider]  mirtazapine (REMERON) 7.5 MG tablet Take 7.5 mg by mouth at bedtime. 08/25/20   [provider]  omeprazole (PRILOSEC) 40 MG capsule Take 40 mg by mouth daily.    [provider]  potassium chloride (KLOR-CON) 10 MEQ tablet Take 10 mEq by mouth daily. 03/11/20   [provider]  senna (SENOKOT) 8.6 MG tablet Take 2 tablets by mouth daily.    [provider]  spironolactone (ALDACTONE) 25 MG tablet Take 1 tablet (25 mg total) by mouth daily. 10/14/17 07/21/20  Henreitta Leber, MD  telmisartan (MICARDIS) 40 MG tablet Take by mouth. 06/21/21   [provider]  torsemide (DEMADEX) 20 MG tablet Take 20 mg by mouth daily as needed (fluid).     [provider]  Vitamin D, Ergocalciferol, (DRISDOL) 1.25 MG (50000 UNIT) CAPS capsule Take 50,000 Units by mouth once a week. 06/21/21   [provider]    Allergies Levofloxacin, Penicillins, Other, Penicillin g, and Sulfa antibiotics    Social History Social History   Tobacco Use   Smoking status: Never   Smokeless tobacco: Never  Vaping Use   Vaping Use: Never used  Substance Use Topics   Alcohol use: No    Alcohol/week: 0.0 standard drinks   Drug use: No    Review of Systems Patient denies headaches, rhinorrhea, blurry vision, numbness, shortness of breath, chest pain, edema, cough, abdominal pain, nausea, vomiting, diarrhea, dysuria, fevers, rashes or hallucinations unless otherwise stated above in HPI. ____________________________________________   PHYSICAL  EXAM:  VITAL SIGNS: Vitals:   07/20/21 1517 07/20/21 1737  BP: (!) 145/70 122/63  Pulse: 95 87  Resp: 18 18  Temp: 98 F (36.7 C) 98.7 F (37.1 C)  SpO2: 92% 98%    Constitutional: Alert and oriented.  Eyes: Conjunctivae are normal.  Head: Atraumatic. Nose: No congestion/rhinnorhea. Mouth/Throat: Mucous membranes are moist.   Neck: No stridor. Painless ROM.  Cardiovascular: Normal rate, regular rhythm. Grossly normal heart sounds.  Good peripheral circulation. Respiratory: Normal respiratory effort.  No retractions. Lungs CTAB. Gastrointestinal: Soft without guarding or diffuse tenderness palpation does have quite a bit of tenderness at the insertion site with some purulent drainage and surrounding streaky erythema. Genitourinary:  Musculoskeletal: No lower extremity tenderness nor edema.  No joint effusions. Neurologic:  Normal speech and language. No gross focal neurologic deficits are appreciated. No facial droop Skin:  Skin is warm, dry and intact. No rash noted. Psychiatric: Mood and affect are normal. Speech and behavior are  normal.  ____________________________________________   LABS (all labs ordered are listed, but only abnormal results are displayed)  Results for orders placed or performed during the hospital encounter of 07/20/21 (from the past 24 hour(s))  Lactic acid, plasma     Status: Abnormal   Collection Time: 07/20/21  3:19 PM  Result Value Ref Range   Lactic Acid, Venous 2.4 (HH) 0.5 - 1.9 mmol/L  Comprehensive metabolic panel     Status: Abnormal   Collection Time: 07/20/21  3:19 PM  Result Value Ref Range   Sodium 136 135 - 145 mmol/L   Potassium 3.7 3.5 - 5.1 mmol/L   Chloride 99 98 - 111 mmol/L   CO2 26 22 - 32 mmol/L   Glucose, Bld 304 (H) 70 - 99 mg/dL   BUN 53 (H) 8 - 23 mg/dL   Creatinine, Ser 7.68 (H) 0.44 - 1.00 mg/dL   Calcium 8.0 (L) 8.9 - 10.3 mg/dL   Total Protein 7.0 6.5 - 8.1 g/dL   Albumin 2.4 (L) 3.5 - 5.0 g/dL   AST 24 15 - 41 U/L   ALT 13 0 - 44 U/L   Alkaline Phosphatase 66 38 - 126 U/L   Total Bilirubin 0.9 0.3 - 1.2 mg/dL   GFR, Estimated 5 (L) >60 mL/min   Anion gap 11 5 - 15  CBC with Differential     Status: Abnormal   Collection Time: 07/20/21  3:19 PM  Result Value Ref Range   WBC 5.6 4.0 - 10.5 K/uL   RBC 3.10 (L) 3.87 - 5.11 MIL/uL   Hemoglobin 8.9 (L) 12.0 - 15.0 g/dL   HCT 28.8 (L) 36.0 - 46.0 %   MCV 92.9 80.0 - 100.0 fL   MCH 28.7 26.0 - 34.0 pg   MCHC 30.9 30.0 - 36.0 g/dL   RDW 17.6 (H) 11.5 - 15.5 %   Platelets 240 150 - 400 K/uL   nRBC 0.0 0.0 - 0.2 %   Neutrophils Relative % 86 %   Neutro Abs 4.8 1.7 - 7.7 K/uL   Lymphocytes Relative 7 %   Lymphs Abs 0.4 (L) 0.7 - 4.0 K/uL   Monocytes Relative 4 %   Monocytes Absolute 0.2 0.1 - 1.0 K/uL   Eosinophils Relative 1 %   Eosinophils Absolute 0.1 0.0 - 0.5 K/uL   Basophils Relative 1 %   Basophils Absolute 0.1 0.0 - 0.1 K/uL   Immature Granulocytes 1 %   Abs Immature Granulocytes 0.07 0.00 - 0.07 K/uL  Lactic acid, plasma     Status: None   Collection Time: 07/20/21  5:39 PM  Result  Value Ref Range   Lactic Acid, Venous 1.7 0.5 - 1.9 mmol/L  Urinalysis, Routine w reflex microscopic     Status: Abnormal   Collection Time: 07/20/21  5:39 PM  Result Value Ref Range   Color, Urine YELLOW (A) YELLOW   APPearance CLOUDY (A) CLEAR   Specific Gravity, Urine 1.016 1.005 - 1.030   pH 7.0 5.0 - 8.0   Glucose, UA 150 (A) NEGATIVE mg/dL   Hgb urine dipstick SMALL (A) NEGATIVE   Bilirubin Urine NEGATIVE NEGATIVE   Ketones, ur NEGATIVE NEGATIVE mg/dL   Protein, ur 100 (A) NEGATIVE mg/dL   Nitrite NEGATIVE NEGATIVE   Leukocytes,Ua LARGE (A) NEGATIVE   RBC / HPF 6-10 0 - 5 RBC/hpf   WBC, UA >50 (H) 0 - 5 WBC/hpf   Bacteria, UA RARE (A) NONE SEEN   Squamous Epithelial / LPF 21-50 0 - 5   Non Squamous Epithelial PRESENT (A) NONE SEEN   ____________________________________________  EKG____________________________________________  RADIOLOGY   ____________________________________________   PROCEDURES  Procedure(s) performed:  Procedures    Critical Care performed: no ____________________________________________   INITIAL IMPRESSION / ASSESSMENT AND PLAN / ED COURSE  Pertinent labs & imaging results that were available during my care of the patient were reviewed by me and considered in my medical decision making (see chart for details).   DDX: sbp, catheter infection, abscess, cellulitis  Catherine Burke is a 80 y.o. who presents to the ED with symptoms as described above.  Presentation consistent with displaced catheter and surrounding area of infection consistent with cellulitis.  Possible SBP though she is not diffusely peritonitis.  Will be started on broad-spectrum antibiotics.  I discussed the case in consultation with Dr. Juleen China of nephrology who agrees with plan for admission for IV antibiotics and has requested general surgery be consulted as well for with catheter management.  Given her presentation and as her lactate was initially elevated but now  downtrending we will discussed the case with hospitalist for admission for further medical management.     The patient was evaluated in Emergency Department today for the symptoms described in the history of present illness. He/she was evaluated in the context of the global COVID-19 pandemic, which necessitated consideration that the patient might be at risk for infection with the SARS-CoV-2 virus that causes COVID-19. Institutional protocols and algorithms that pertain to the evaluation of patients at risk for COVID-19 are in a state of rapid change based on information released by regulatory bodies including the CDC and federal and state organizations. These policies and algorithms were followed during the patient's care in the ED.  As part of my medical decision making, I reviewed the following data within the [place] notes reviewed and incorporated, Labs reviewed, notes from prior ED visits and [place] Controlled Substance Database   ____________________________________________   FINAL CLINICAL IMPRESSION(S) / ED DIAGNOSES  Final diagnoses:  PD catheter dysfunction, initial encounter ([place])  Cellulitis of abdominal wall      NEW MEDICATIONS STARTED DURING THIS VISIT:  New Prescriptions   No medications on file     Note:  This document was prepared using Dragon voice recognition software and may include unintentional dictation errors.    Merlyn Lot, MD 07/20/21 [id number]

## 2021-07-20 NOTE — H&P (Signed)
History and Physical   Catherine Burke DJS:[id number] DOB: Oct 04, 1940 DOA: 07/20/2021  PCP: Idelle Crouch, MD  Outpatient Specialists: Dr. Luna Glasgow, orthopedics Patient coming from: Nephrology clinic/dialysis clinic  I have personally briefly reviewed patient's old medical records in [place].  Chief Concern: Peritoneal dialysis site infection  HPI: Catherine Burke is a 80 y.o. female with medical history significant for hypertension, end-stage renal disease on peritoneal dialysis, hyperlipidemia, GERD, hypertension, chronic anemia, proteinuria, who presents to the emergency department for chief concerns of peritoneal dialysis site infection.  She reports that about 1 month ago when the day was really beautiful, she went outside to pick pecans for several hours.  She reports there was a lot of bending over and the peritoneal dialysis catheter hit her leg several times that day.  She reports a few days later she noticed redness at the site and she was prescribed outpatient antibiotics which she completed.  She reports that the sites infection resolved at that time.  She reports that approximately 1 week ago, patient was sitting on her chair in the peritoneal dialysis tubing was caught and wrapped around a chair and she stood up causing the peritoneal tubing to pull from her abdominal cavity.  It did not completely dislodge.  She was able to continue peritoneal dialysis this entire week including an evening peritoneal dialysis session on 07/19/2021.  She denies any fever, chills, new cough, chest pain, new shortness of breath, abdominal pain other than at the site surrounding the dialysis catheter, syncope, dysuria, hematuria, diarrhea, loss of consciousness.  She reports that she still makes urine however less than prior to dialysis initiation.  Social history: She lives at home by herself.  She performs most of her ADLs.  She gets her grocery ordered to her house.  She denies history of  tobacco, EtOH, recreational drug use.  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  Vaccination history: She is vaccinated for COVID-19 and influenza  ROS: Constitutional: no weight change, no fever ENT/Mouth: no sore throat, no rhinorrhea Eyes: no eye pain, no vision changes Cardiovascular: no chest pain, no dyspnea,  no edema, no palpitations Respiratory: no cough, no sputum, no wheezing Gastrointestinal: no nausea, no vomiting, no diarrhea, no constipation Genitourinary: no urinary incontinence, no dysuria, no hematuria Musculoskeletal: no arthralgias, no myalgias Skin: + skin lesions with surrounding erythema and warmth, no pruritus Neuro: + weakness, no loss of consciousness, no syncope Psych: no anxiety, no depression, + decrease appetite Heme/Lymph: no bruising, no bleeding  ED Course: Discussed with emergency medicine provider, patient requiring hospitalization for chief concerns of peritoneal access infection.  Vitals in the emergency department was remarkable for temperature of 98, respiration rate of 18, heart rate of 95, blood pressure 145/70, improved to 122/63, SPO2 of 92% on room air.  Labs in the emergency department was remarkable for sodium 136, potassium 3.7, chloride 99, bicarb 26, BUN of 53, serum creatinine of 7.68, nonfasting blood glucose 304, GFR of 5.  WBC 5.6, hemoglobin 8.9, platelets 240.  Lactic acid was elevated at 2.4 and improved to 1.7 on repeat.  UA was positive for large leukocytes.  Per EDP, patient was at dialysis center and evaluated by nephrologist.  Peritoneal cavity fluid was obtained for culture at center.  Assessment/Plan  Principal Problem:   Abdominal wall cellulitis Active Problems:   Hyperlipidemia   Essential hypertension   GERD (gastroesophageal reflux disease)   Proteinuria   Acute on  chronic anemia   ESRD (end stage renal disease) ([place])   Peritoneal dialysis status ([place])   B12 deficiency    Cellulitis   # Abdominal wall cellulitis at peritoneal catheter site - EDP consulted general surgery, Dr. Christian Mate who states he will see the patient - Patient did not meet sepsis criteria on review of vitals at the time of my admission.  Patient does have an infection and lactic acid was initially elevated at 2.4 and improved to 1.7. - Blood cultures x2 have been ordered, collected, and is in process - Abdominal exam is benign except for areas surrounding the peritoneal catheter, low clinical suspicion for SBP at this time - Check MRSA - CBC in the a.m. - Images in media in epic  - A.m. team to communicate with Dr. Kolluru/nephrology team for follow-up of peritoneal fluid culture - Continue with broad-spectrum antibiotic with vancomycin and cefepime per pharmacy  # End-stage renal disease on peritoneal dialysis-nephrology has been consulted via EDP and is aware  # Insulin-dependent diabetes mellitus-long-acting glargine 20 units subcutaneous nightly has been resumed for 07/21/2021 - Per med reconciliation patient has taken the long-acting prior to ED presentation  # Hypertension-resumed home amlodipine 2.5 mg, clonidine 0.1 mg, irbesartan 150 mg, Imdur 30 mg daily for 07/21/2021 as per med reconciliation patient has taken these medications prior to ED presentation  # Hyperlipidemia-resumed home atorvastatin nightly  # History of B12 deficiency  - Home multivitamins resumed  Chart reviewed.   DVT prophylaxis: Heparin 5000 units subcutaneous every 8 hours Code Status: Full code Diet: Renal/carb modified Family Communication: No Disposition Plan: Pending clinical course, pending nephrology and general surgery input Consults called: EDP has consulted nephrology and general surgery Admission status: Observation, telemetry, medical floor  Past Medical History:  Diagnosis Date   Anemia    Arthritis    B12 deficiency 04/04/2021   Diabetes mellitus    GERD (gastroesophageal reflux  disease)    HLD (hyperlipidemia)    Hypercholesterolemia    Hypertension    Kidney stones    Osteoporosis    Proteinuria    Past Surgical History:  Procedure Laterality Date   ABDOMINAL HYSTERECTOMY     BACK SURGERY     lumbar   BREAST CYST ASPIRATION     unsure of laterality    CAPD INSERTION N/A 02/13/2018   Procedure: LAPAROSCOPIC INSERTION CONTINUOUS AMBULATORY PERITONEAL DIALYSIS  (CAPD) CATHETER;  Surgeon: Algernon Huxley, MD;  Location: [facility] ORS;  Service: Vascular;  Laterality: N/A;   CHOLECYSTECTOMY     EYE SURGERY     bilateral cataract    NASAL SEPTUM SURGERY     Social History:  reports that she has never smoked. She has never used smokeless tobacco. She reports that she does not drink alcohol and does not use drugs.  Allergies  Allergen Reactions   Levofloxacin Other (See Comments)    Throat Swelling    Penicillins Anaphylaxis   Other    Penicillin G Other (See Comments)   Sulfa Antibiotics    Family History  Problem Relation Age of Onset   Other Mother    Heart attack Father    Diabetes Maternal Grandmother    Breast cancer Paternal Aunt    Family history: Family history reviewed and not pertinent  Prior to Admission medications   Medication Sig Start Date End Date Taking? Authorizing Provider  ACCU-CHEK AVIVA PLUS test strip TESTING TWICE DAILY. 08/11/14   Susy Frizzle, MD  Acetaminophen (TYLENOL) 325 MG  CAPS Take 1 capsule by mouth daily as needed (pain).    [provider]  APAP-MAG SALICYLATE-CAFFEINE PO  once 03/17/20   [provider]  atorvastatin (LIPITOR) 10 MG tablet Take 10 mg by mouth daily. 07/28/20   [provider]  B Complex-C-Biotin-E-Min-FA (DIALYVITE 5000) 5 MG TABS Take 1 tablet by mouth daily. 10/27/20   [provider]  carvedilol (COREG) 3.125 MG tablet Take 3.125 mg by mouth 2 (two) times daily. 10/27/20   [provider]  cephALEXin (KEFLEX) 250 MG capsule Take by mouth 2 (two) times  daily. 06/21/21   [provider]  cetirizine (ZYRTEC) 10 MG tablet Take by mouth. 06/09/21   [provider]  cloNIDine (CATAPRES) 0.1 MG tablet Take by mouth. 07/19/20   [provider]  cloNIDine (CATAPRES) 0.1 MG tablet Take by mouth. 06/09/21   [provider]  diclofenac Sodium (VOLTAREN) 1 % GEL Apply 2 g topically daily as needed (pain). Patient not taking: Reported on 07/04/2021    [provider]  doxazosin (CARDURA) 2 MG tablet Take 2 mg by mouth at bedtime. 11/23/20   [provider]  doxycycline (VIBRAMYCIN) 100 MG capsule Take 1 capsule (100 mg total) by mouth 2 (two) times daily. 12/07/20   Rodriguez-Southworth, Sunday Spillers, PA-C  Ferrous Sulfate 134 MG TABS Take 1 tablet (134 mg total) by mouth daily. 09/09/20   Annita Brod, MD  hydrALAZINE (APRESOLINE) 100 MG tablet Take 1 tablet (100 mg total) by mouth every 8 (eight) hours. 10/13/17 07/21/20  Henreitta Leber, MD  Insulin Glargine (LANTUS SOLOSTAR) 100 UNIT/ML Solostar Pen Inject 20 Units into the skin at bedtime.  12/11/18   [provider]  Insulin Pen Needle 31G X 5 MM MISC As directed 03/31/15   Alycia Rossetti, MD  isosorbide mononitrate (IMDUR) 30 MG 24 hr tablet Take 30 mg by mouth daily. 08/12/20   [provider]  magnesium oxide (MAG-OX) 400 MG tablet Take 1 tablet by mouth daily. 10/13/20   [provider]  magnesium oxide (MAG-OX) 400 MG tablet Take by mouth. 06/09/21   [provider]  MAGNESIUM-OXIDE 400 (241.3 Mg) MG tablet Take 1 tablet by mouth daily. 12/03/20   [provider]  mirtazapine (REMERON) 7.5 MG tablet Take 7.5 mg by mouth at bedtime. 08/25/20   [provider]  omeprazole (PRILOSEC) 40 MG capsule Take 40 mg by mouth daily.    [provider]  potassium chloride (KLOR-CON) 10 MEQ tablet Take 10 mEq by mouth daily. 03/11/20   [provider]  senna (SENOKOT) 8.6 MG tablet Take 2 tablets by  mouth daily.    [provider]  spironolactone (ALDACTONE) 25 MG tablet Take 1 tablet (25 mg total) by mouth daily. 10/14/17 07/21/20  Henreitta Leber, MD  telmisartan (MICARDIS) 40 MG tablet Take by mouth. 06/21/21   [provider]  torsemide (DEMADEX) 20 MG tablet Take 20 mg by mouth daily as needed (fluid).    [provider]  Vitamin D, Ergocalciferol, (DRISDOL) 1.25 MG (50000 UNIT) CAPS capsule Take 50,000 Units by mouth once a week. 06/21/21   [provider]   Physical Exam: Vitals:   07/20/21 1517 07/20/21 1517 07/20/21 1737  BP:  (!) 145/70 122/63  Pulse:  95 87  Resp:  18 18  Temp:  98 F (36.7 C) 98.7 F (37.1 C)  TempSrc:   Oral  SpO2:  92% 98%  Weight: 55.6 kg  Height: 5\' 2"  (1.575 m)     Constitutional: appears age-appropriate, NAD, calm, comfortable Eyes: PERRL, lids and conjunctivae normal ENMT: Mucous membranes are moist. Posterior pharynx clear of any exudate or lesions. Age-appropriate dentition. Hearing appropriate Neck: normal, supple, no masses, no thyromegaly Respiratory: clear to auscultation bilaterally, no wheezing, no crackles. Normal respiratory effort. No accessory muscle use.  Cardiovascular: Regular rate and rhythm, no murmurs / rubs / gallops. No extremity edema. 2+ pedal pulses. No carotid bruits.  Abdomen: + Mild tenderness surrounding peritoneal dialysis catheter site, no masses palpated, no hepatosplenomegaly. Bowel sounds positive.  Musculoskeletal: no clubbing / cyanosis. No joint deformity upper and lower extremities. Good ROM, no contractures, no atrophy. Normal muscle tone.  Skin: Positive for cellulitis surrounding peritoneal catheter site   Neurologic: Sensation intact. Strength 5/5 in all 4.  Psychiatric: Normal judgment and insight. Alert and oriented x 3. Normal mood.   EKG: Not indicated  Chest x-ray on Admission: Not indicated at this time  Labs on Admission: I have personally reviewed  following labs  CBC: Recent Labs  Lab 07/20/21 1519  WBC 5.6  NEUTROABS 4.8  HGB 8.9*  HCT 28.8*  MCV 92.9  PLT 650   Basic Metabolic Panel: Recent Labs  Lab 07/20/21 1519  NA 136  K 3.7  CL 99  CO2 26  GLUCOSE 304*  BUN 53*  CREATININE 7.68*  CALCIUM 8.0*   GFR: Estimated Creatinine Clearance: 4.5 mL/min (A) (by C-G formula based on SCr of 7.68 mg/dL (H)).  Liver Function Tests: Recent Labs  Lab 07/20/21 1519  AST 24  ALT 13  ALKPHOS 66  BILITOT 0.9  PROT 7.0  ALBUMIN 2.4*   Urine analysis:    Component Value Date/Time   COLORURINE YELLOW (A) 07/20/2021 1739   APPEARANCEUR CLOUDY (A) 07/20/2021 1739   LABSPEC 1.016 07/20/2021 1739   PHURINE 7.0 07/20/2021 1739   GLUCOSEU 150 (A) 07/20/2021 1739   HGBUR SMALL (A) 07/20/2021 1739   BILIRUBINUR NEGATIVE 07/20/2021 1739   KETONESUR NEGATIVE 07/20/2021 1739   PROTEINUR 100 (A) 07/20/2021 1739   UROBILINOGEN 0.2 02/24/2015 0853   NITRITE NEGATIVE 07/20/2021 1739   LEUKOCYTESUR LARGE (A) 07/20/2021 1739   Dr. Tobie Poet [facility]  If 7PM-7AM, please contact overnight-coverage provider If 7AM-7PM, please contact day coverage provider [email or web address]  07/20/2021, 8:51 PM

## 2021-07-20 NOTE — Progress Notes (Signed)
Pharmacy Antibiotic Note  Catherine Burke is a 80 y.o. female admitted on 07/20/2021 with  SBP vs cellulitis .  Pharmacy has been consulted for vancomycin and cefepime dosing.  Patient with ESRD on PD. Developed pain and redness with drainage at insertion site after inadvertently pulling out catheter.  Ordered vancomycin 1250 mg (20-25 mg/kg for PD patients) and cefepime x 1 gram in ED. Noted allergy to penicillin (anaphylaxis in 2014), though patient has tolerated ceftriaxone in the more recent past.   Plan: Continue cefepime 1 gram every 24 hours  Continue vancomycin variable dosing while awaiting nephrology plans. Obtain 48-hour vancomycin level (11/18 PM).   Monitor nephrology plans, clinical course, LOT.  Height: 5\' 2"  (157.5 cm) Weight: 55.6 kg (122 lb 9.2 oz) IBW/kg (Calculated) : 50.1  Temp (24hrs), Avg:98.4 F (36.9 C), Min:98 F (36.7 C), Max:98.7 F (37.1 C)  Recent Labs  Lab 07/20/21 1519 07/20/21 1739  WBC 5.6  --   CREATININE 7.68*  --   LATICACIDVEN 2.4* 1.7    Estimated Creatinine Clearance: 4.5 mL/min (A) (by C-G formula based on SCr of 7.68 mg/dL (H)).    Allergies  Allergen Reactions   Levofloxacin Other (See Comments)    Throat Swelling    Penicillins Anaphylaxis   Other    Penicillin G Other (See Comments)   Sulfa Antibiotics     Antimicrobials this admission: 11/16 vancomycin >>  11/16 cefepime >>   Dose adjustments this admission: N/a  Microbiology results: 11/16 BCx: needs to be collected 11/16 MRSA PCR: sent  Thank you for allowing pharmacy to be a part of this patient's care.  Wynelle Cleveland, PharmD Pharmacy Resident  07/20/2021 7:50 PM

## 2021-07-21 ENCOUNTER — Observation Stay: Payer: Medicare PPO | Admitting: Certified Registered"

## 2021-07-21 ENCOUNTER — Encounter: Payer: Self-pay | Admitting: Internal Medicine

## 2021-07-21 ENCOUNTER — Encounter
Admission: EM | Disposition: A | Discharge status: Home / Self Care | Payer: Self-pay | Source: Home / Self Care | Attending: Student in an Organized Health Care Education/Training Program

## 2021-07-21 DIAGNOSIS — L03311 Cellulitis of abdominal wall: Secondary | ICD-10-CM | POA: Diagnosis not present

## 2021-07-21 DIAGNOSIS — Z992 Dependence on renal dialysis: Secondary | ICD-10-CM

## 2021-07-21 DIAGNOSIS — T85611A Breakdown (mechanical) of intraperitoneal dialysis catheter, initial encounter: Secondary | ICD-10-CM | POA: Diagnosis not present

## 2021-07-21 DIAGNOSIS — D649 Anemia, unspecified: Secondary | ICD-10-CM | POA: Diagnosis not present

## 2021-07-21 DIAGNOSIS — N186 End stage renal disease: Secondary | ICD-10-CM

## 2021-07-21 DIAGNOSIS — I120 Hypertensive chronic kidney disease with stage 5 chronic kidney disease or end stage renal disease: Secondary | ICD-10-CM

## 2021-07-21 HISTORY — PX: CAPD REMOVAL: SHX5234

## 2021-07-21 HISTORY — PX: CAPD INSERTION: SHX5233

## 2021-07-21 LAB — COMPREHENSIVE METABOLIC PANEL
ALT: 11 U/L (ref 0–44)
AST: 16 U/L (ref 15–41)
Albumin: 2 g/dL — ABNORMAL LOW (ref 3.5–5.0)
Alkaline Phosphatase: 52 U/L (ref 38–126)
Anion gap: 14 (ref 5–15)
BUN: 60 mg/dL — ABNORMAL HIGH (ref 8–23)
CO2: 24 mmol/L (ref 22–32)
Calcium: 7.9 mg/dL — ABNORMAL LOW (ref 8.9–10.3)
Chloride: 99 mmol/L (ref 98–111)
Creatinine, Ser: 8.98 mg/dL — ABNORMAL HIGH (ref 0.44–1.00)
GFR, Estimated: 4 mL/min — ABNORMAL LOW (ref >60)
Glucose, Bld: 133 mg/dL — ABNORMAL HIGH (ref 70–99)
Potassium: 3.7 mmol/L (ref 3.5–5.1)
Sodium: 137 mmol/L (ref 135–145)
Total Bilirubin: 0.7 mg/dL (ref 0.3–1.2)
Total Protein: 6.1 g/dL — ABNORMAL LOW (ref 6.5–8.1)

## 2021-07-21 LAB — CBG MONITORING, ED
Glucose-Capillary: 125 mg/dL — ABNORMAL HIGH (ref 70–99)
Glucose-Capillary: 125 mg/dL — ABNORMAL HIGH (ref 70–99)
Glucose-Capillary: 140 mg/dL — ABNORMAL HIGH (ref 70–99)
Glucose-Capillary: 163 mg/dL — ABNORMAL HIGH (ref 70–99)

## 2021-07-21 LAB — CBC
HCT: 24.3 % — ABNORMAL LOW (ref 36.0–46.0)
Hemoglobin: 7.7 g/dL — ABNORMAL LOW (ref 12.0–15.0)
MCH: 29.6 pg (ref 26.0–34.0)
MCHC: 31.7 g/dL (ref 30.0–36.0)
MCV: 93.5 fL (ref 80.0–100.0)
Platelets: 219 10*3/uL (ref 150–400)
RBC: 2.6 MIL/uL — ABNORMAL LOW (ref 3.87–5.11)
RDW: 17.5 % — ABNORMAL HIGH (ref 11.5–15.5)
WBC: 3.3 10*3/uL — ABNORMAL LOW (ref 4.0–10.5)
nRBC: 0 % (ref 0.0–0.2)

## 2021-07-21 LAB — GLUCOSE, CAPILLARY: Glucose-Capillary: 167 mg/dL — ABNORMAL HIGH (ref 70–99)

## 2021-07-21 SURGERY — LAPAROSCOPIC REMOVAL CONTINUOUS AMBULATORY PERITONEAL DIALYSIS  (CAPD) CATHETER
Anesthesia: General

## 2021-07-21 MED ORDER — GLYCOPYRROLATE 0.2 MG/ML IJ SOLN
INTRAMUSCULAR | Status: DC | PRN
  Administered 2021-07-21: .2 mg via INTRAVENOUS

## 2021-07-21 MED ORDER — SODIUM CHLORIDE 0.9 % IV SOLN: INTRAVENOUS | Status: DC | PRN

## 2021-07-21 MED ORDER — SUCCINYLCHOLINE CHLORIDE 200 MG/10ML IV SOSY
PREFILLED_SYRINGE | INTRAVENOUS | Status: DC | PRN
  Administered 2021-07-21: 100 mg via INTRAVENOUS

## 2021-07-21 MED ORDER — PHENYLEPHRINE HCL (PRESSORS) 10 MG/ML IV SOLN
INTRAVENOUS | Status: DC | PRN
  Administered 2021-07-21: 200 ug via INTRAVENOUS

## 2021-07-21 MED ORDER — DEXAMETHASONE SODIUM PHOSPHATE 10 MG/ML IJ SOLN
INTRAMUSCULAR | Status: DC | PRN
  Administered 2021-07-21: 10 mg via INTRAVENOUS

## 2021-07-21 MED ORDER — ONDANSETRON HCL 4 MG/2ML IJ SOLN
INTRAMUSCULAR | Status: DC | PRN
  Administered 2021-07-21 (×2): 4 mg via INTRAVENOUS

## 2021-07-21 MED ORDER — SUGAMMADEX SODIUM 200 MG/2ML IV SOLN
INTRAVENOUS | Status: DC | PRN
  Administered 2021-07-21: 200 mg via INTRAVENOUS

## 2021-07-21 MED ORDER — 0.9 % SODIUM CHLORIDE (POUR BTL) OPTIME
TOPICAL | Status: DC | PRN
  Administered 2021-07-21: 18:00:00 200 mL

## 2021-07-21 MED ORDER — LIDOCAINE HCL (CARDIAC) PF 100 MG/5ML IV SOSY
PREFILLED_SYRINGE | INTRAVENOUS | Status: DC | PRN
  Administered 2021-07-21: 80 mg via INTRAVENOUS

## 2021-07-21 MED ORDER — LABETALOL HCL 5 MG/ML IV SOLN
INTRAVENOUS | Status: DC | PRN
  Administered 2021-07-21: 2.5 mg via INTRAVENOUS

## 2021-07-21 MED ORDER — FENTANYL CITRATE (PF) 100 MCG/2ML IJ SOLN
INTRAMUSCULAR | Status: DC | PRN
  Administered 2021-07-21 (×3): 50 ug via INTRAVENOUS

## 2021-07-21 MED ORDER — ACETAMINOPHEN 10 MG/ML IV SOLN
INTRAVENOUS | Status: DC | PRN
  Administered 2021-07-21 (×2): 1000 mg via INTRAVENOUS

## 2021-07-21 MED ORDER — ROCURONIUM BROMIDE 100 MG/10ML IV SOLN
INTRAVENOUS | Status: DC | PRN
  Administered 2021-07-21: 30 mg via INTRAVENOUS
  Administered 2021-07-21: 10 mg via INTRAVENOUS

## 2021-07-21 MED ORDER — CEFAZOLIN SODIUM-DEXTROSE 2-3 GM-%(50ML) IV SOLR
INTRAVENOUS | Status: DC | PRN
  Administered 2021-07-21: 2 g via INTRAVENOUS

## 2021-07-21 MED ORDER — INSULIN GLARGINE-YFGN 100 UNIT/ML ~~LOC~~ SOLN
20.0000 [IU] | Freq: Every day | SUBCUTANEOUS | Status: DC
  Administered 2021-07-21 – 2021-07-22 (×2): 20 [IU] via SUBCUTANEOUS
  Filled 2021-07-21 (×3): qty 0.2

## 2021-07-21 MED ORDER — ONDANSETRON HCL 4 MG/2ML IJ SOLN: 4.0000 mg | Freq: Once | INTRAMUSCULAR | Status: DC | PRN

## 2021-07-21 MED ORDER — FENTANYL CITRATE (PF) 100 MCG/2ML IJ SOLN: 25.0000 ug | INTRAMUSCULAR | Status: DC | PRN

## 2021-07-21 MED ORDER — FENTANYL CITRATE (PF) 100 MCG/2ML IJ SOLN
INTRAMUSCULAR | Status: AC
  Filled 2021-07-21: qty 2

## 2021-07-21 MED ORDER — ACETAMINOPHEN 10 MG/ML IV SOLN
INTRAVENOUS | Status: AC
  Filled 2021-07-21: qty 100

## 2021-07-21 MED ORDER — HYDROCODONE-ACETAMINOPHEN 5-325 MG PO TABS: 1.0000 | ORAL_TABLET | ORAL | Status: DC | PRN

## 2021-07-21 MED ORDER — PROPOFOL 10 MG/ML IV BOLUS
INTRAVENOUS | Status: DC | PRN
  Administered 2021-07-21: 100 mg via INTRAVENOUS

## 2021-07-21 MED ORDER — BUPIVACAINE-EPINEPHRINE (PF) 0.25% -1:200000 IJ SOLN
INTRAMUSCULAR | Status: AC
  Filled 2021-07-21: qty 30

## 2021-07-21 MED ORDER — PHENYLEPHRINE HCL-NACL 20-0.9 MG/250ML-% IV SOLN
INTRAVENOUS | Status: DC | PRN
  Administered 2021-07-21: 25 ug/min via INTRAVENOUS

## 2021-07-21 SURGICAL SUPPLY — 47 items
ADAPTER CATH DIALYSIS 4X8 IT L (MISCELLANEOUS) ×2 IMPLANT
BIOPATCH WHT 1IN DISK W/4.0 H (GAUZE/BANDAGES/DRESSINGS) ×2 IMPLANT
BLADE CLIPPER SURG (BLADE) ×2 IMPLANT
CATH EXTENDED DIALYSIS (CATHETERS) ×2 IMPLANT
CHLORAPREP W/TINT 26 (MISCELLANEOUS) ×2 IMPLANT
DEFOGGER SCOPE WARMER CLEARIFY (MISCELLANEOUS) ×2 IMPLANT
DERMABOND ADVANCED (GAUZE/BANDAGES/DRESSINGS) ×1
DERMABOND ADVANCED .7 DNX12 (GAUZE/BANDAGES/DRESSINGS) ×1 IMPLANT
DRAPE INCISE IOBAN 66X45 STRL (DRAPES) ×2 IMPLANT
DRAPE LAPAROTOMY 77X122 PED (DRAPES) ×4 IMPLANT
DURAPREP 26ML APPLICATOR (WOUND CARE) ×2 IMPLANT
ELECT REM PT RETURN 9FT ADLT (ELECTROSURGICAL) ×2
ELECTRODE REM PT RTRN 9FT ADLT (ELECTROSURGICAL) ×1 IMPLANT
GAUZE 4X4 16PLY ~~LOC~~+RFID DBL (SPONGE) ×2 IMPLANT
GLOVE SURG ORTHO LTX SZ7.5 (GLOVE) ×2 IMPLANT
GOWN STRL REUS W/ TWL LRG LVL3 (GOWN DISPOSABLE) ×2 IMPLANT
GOWN STRL REUS W/TWL LRG LVL3 (GOWN DISPOSABLE) ×2
GRASPER SUT TROCAR 14GX15 (MISCELLANEOUS) IMPLANT
IV NS 1000ML (IV SOLUTION) ×1
IV NS 1000ML BAXH (IV SOLUTION) ×1 IMPLANT
KIT TURNOVER KIT A (KITS) ×2 IMPLANT
MANIFOLD NEPTUNE II (INSTRUMENTS) ×2 IMPLANT
MINICAP W/POVIDONE IODINE SOL (MISCELLANEOUS) ×2 IMPLANT
NEEDLE HYPO 22GX1.5 SAFETY (NEEDLE) ×2 IMPLANT
NEEDLE INSUFFLATION 14GA 120MM (NEEDLE) IMPLANT
NS IRRIG 500ML POUR BTL (IV SOLUTION) ×2 IMPLANT
PACK LAP CHOLECYSTECTOMY (MISCELLANEOUS) ×2 IMPLANT
SET CYSTO W/LG BORE CLAMP LF (SET/KITS/TRAYS/PACK) ×2 IMPLANT
SET TRANSFER 6 W/TWIST CLAMP 5 (SET/KITS/TRAYS/PACK) ×2 IMPLANT
SET TUBE SMOKE EVAC HIGH FLOW (TUBING) ×2 IMPLANT
SLEEVE ADV FIXATION 5X100MM (TROCAR) ×4 IMPLANT
SPONGE DRAIN TRACH 4X4 STRL 2S (GAUZE/BANDAGES/DRESSINGS) ×2 IMPLANT
STYLET FALLER (MISCELLANEOUS) ×2 IMPLANT
STYLET FALLER MEDIONICS (MISCELLANEOUS) IMPLANT
SUT MNCRL 4-0 (SUTURE) ×1
SUT MNCRL 4-0 27XMFL (SUTURE) ×1
SUT PROLENE 0 CT 2 (SUTURE) ×2 IMPLANT
SUT VIC AB 3-0 SH 27 (SUTURE) ×1
SUT VIC AB 3-0 SH 27X BRD (SUTURE) ×1 IMPLANT
SUT VIC AB 4-0 FS2 27 (SUTURE) ×2 IMPLANT
SUT VICRYL 0 AB UR-6 (SUTURE) IMPLANT
SUT VICRYL 0 TIES 12 18 (SUTURE) ×2 IMPLANT
SUTURE MNCRL 4-0 27XMF (SUTURE) ×1 IMPLANT
SYS KII FIOS ACCESS ABD 5X100 (TROCAR) ×4
SYSTEM KII FIOS ACES ABD 5X100 (TROCAR) ×2 IMPLANT
TROCAR Z-THREAD OPTICAL 5X100M (TROCAR) ×4 IMPLANT
WATER STERILE IRR 500ML POUR (IV SOLUTION) ×2 IMPLANT

## 2021-07-21 NOTE — Anesthesia Procedure Notes (Signed)
Procedure Name: Intubation Date/Time: 07/21/2021 4:41 PM Performed by: Kelton Pillar, CRNA Pre-anesthesia Checklist: Patient identified, Emergency Drugs available, Suction available and Patient being monitored Patient Re-evaluated:Patient Re-evaluated prior to induction Oxygen Delivery Method: Circle system utilized Preoxygenation: Pre-oxygenation with 100% oxygen Induction Type: IV induction Ventilation: Mask ventilation without difficulty Laryngoscope Size: McGraph and 3 Grade View: Grade I Tube type: Oral Number of attempts: 1 Airway Equipment and Method: Stylet and Oral airway Placement Confirmation: ETT inserted through vocal cords under direct vision, positive ETCO2, breath sounds checked- equal and bilateral and CO2 detector Secured at: 21 cm Tube secured with: Tape Dental Injury: Teeth and Oropharynx as per pre-operative assessment

## 2021-07-21 NOTE — Consult Note (Signed)
[facility] SURGICAL CONSULTATION NOTE (initial) - cpt: 99244   HISTORY OF PRESENT ILLNESS (HPI):  80 y.o. female presented to [facility] ED yesterday for evaluation of problems with her PD catheter. Patient with a history of ESRD on PD which was placed in June of 2019 with Dr Lucky Cowboy. She reports that the catheter accidentally got wrapped around her rocking chair and when she moved she felt a "pop" and the catheter seemed to potentially pull out some. Fortunately, the catheter was still functional, and she was able to dialyze on Tuesday night. However, she has noticed worsening erythema and mild abdominal pain at the PD insertion site. No fevers, chills, or drainage from the site. She did have peritoneal fluid cultured at her dialysis center but was referred to the ED for evaluation. Work up in the ED revealed labs consistent with degree of ESRD and fortunately she was without leukocytosis. She was started on Cefepime and Vancomycin and admitted to the medicine service.   Surgery is consulted by emergency medicine physician Dr. Merlyn Lot, MD in this context for evaluation and management of potential PD catheter infection.  PAST MEDICAL HISTORY (PMH):  Past Medical History:  Diagnosis Date   Anemia    Arthritis    B12 deficiency 04/04/2021   Diabetes mellitus    GERD (gastroesophageal reflux disease)    HLD (hyperlipidemia)    Hypercholesterolemia    Hypertension    Kidney stones    Osteoporosis    Proteinuria      PAST SURGICAL HISTORY ([place]):  Past Surgical History:  Procedure Laterality Date   ABDOMINAL HYSTERECTOMY     BACK SURGERY     lumbar   BREAST CYST ASPIRATION     unsure of laterality    CAPD INSERTION N/A 02/13/2018   Procedure: LAPAROSCOPIC INSERTION CONTINUOUS AMBULATORY PERITONEAL DIALYSIS  (CAPD) CATHETER;  Surgeon: Algernon Huxley, MD;  Location: [facility] ORS;  Service: Vascular;  Laterality: N/A;   CHOLECYSTECTOMY     EYE SURGERY     bilateral cataract     NASAL SEPTUM SURGERY       MEDICATIONS:  Prior to Admission medications   Medication Sig Start Date End Date Taking? Authorizing Provider  Acetaminophen (TYLENOL) 325 MG CAPS Take 1 capsule by mouth daily as needed (pain).   Yes [provider]  amLODipine (NORVASC) 2.5 MG tablet Take 2.5 mg by mouth daily. 04/22/21  Yes [provider]  APAP-MAG SALICYLATE-CAFFEINE PO  once 03/17/20  Yes [provider]  atorvastatin (LIPITOR) 10 MG tablet Take 10 mg by mouth daily. 07/28/20  Yes [provider]  B Complex-C-Biotin-E-Min-FA (DIALYVITE 5000) 5 MG TABS Take 1 tablet by mouth daily. 10/27/20  Yes [provider]  cetirizine (ZYRTEC) 10 MG tablet Take by mouth. 06/09/21  Yes [provider]  cloNIDine (CATAPRES) 0.1 MG tablet Take by mouth. 07/19/20  Yes [provider]  Insulin Glargine (LANTUS SOLOSTAR) 100 UNIT/ML Solostar Pen Inject 20 Units into the skin at bedtime.  12/11/18  Yes [provider]  isosorbide mononitrate (IMDUR) 30 MG 24 hr tablet Take 30 mg by mouth daily. 08/12/20  Yes [provider]  MAGNESIUM-OXIDE 400 (240 Mg) MG tablet Take 1 tablet by mouth daily. 07/05/21  Yes [provider]  potassium chloride (KLOR-CON) 10 MEQ tablet Take 10 mEq by mouth daily. 03/11/20  Yes [provider]  senna (SENOKOT) 8.6 MG tablet Take 2 tablets by mouth daily.   Yes [provider]  telmisartan (MICARDIS) 40 MG tablet Take by mouth. 06/21/21  Yes [provider]  torsemide (DEMADEX) 100 MG tablet Take 100 mg by mouth daily as needed. 04/25/21  Yes [provider]  ACCU-CHEK AVIVA PLUS test strip TESTING TWICE DAILY. 08/11/14   Susy Frizzle, MD  carvedilol (COREG) 3.125 MG tablet Take 3.125 mg by mouth 2 (two) times daily. Patient not taking: No sig reported 10/27/20   [provider]  cephALEXin (KEFLEX) 250 MG capsule Take by mouth 2 (two) times daily. Patient  not taking: No sig reported 06/21/21   [provider]  diclofenac Sodium (VOLTAREN) 1 % GEL Apply 2 g topically daily as needed (pain). Patient not taking: No sig reported    [provider]  doxazosin (CARDURA) 2 MG tablet Take 2 mg by mouth at bedtime. Patient not taking: No sig reported 11/23/20   [provider]  doxycycline (VIBRAMYCIN) 100 MG capsule Take 1 capsule (100 mg total) by mouth 2 (two) times daily. Patient not taking: No sig reported 12/07/20   Rodriguez-Southworth, Sunday Spillers, PA-C  Ferrous Sulfate 134 MG TABS Take 1 tablet (134 mg total) by mouth daily. Patient not taking: No sig reported 09/09/20   Annita Brod, MD  hydrALAZINE (APRESOLINE) 100 MG tablet Take 1 tablet (100 mg total) by mouth every 8 (eight) hours. 10/13/17 07/21/20  Henreitta Leber, MD  Insulin Pen Needle 31G X 5 MM MISC As directed 03/31/15   Alycia Rossetti, MD  magnesium oxide (MAG-OX) 400 MG tablet Take 1 tablet by mouth daily. Patient not taking: No sig reported 10/13/20   [provider]  mirtazapine (REMERON) 7.5 MG tablet Take 7.5 mg by mouth at bedtime. Patient not taking: No sig reported 08/25/20   [provider]  omeprazole (PRILOSEC) 40 MG capsule Take 40 mg by mouth daily. Patient not taking: No sig reported    [provider]  spironolactone (ALDACTONE) 25 MG tablet Take 1 tablet (25 mg total) by mouth daily. 10/14/17 07/21/20  Henreitta Leber, MD  torsemide (DEMADEX) 20 MG tablet Take 20 mg by mouth daily as needed (fluid). Patient not taking: No sig reported    [provider]  Vitamin D, Ergocalciferol, (DRISDOL) 1.25 MG (50000 UNIT) CAPS capsule Take 50,000 Units by mouth once a week. 06/21/21   [provider]     ALLERGIES:  Allergies  Allergen Reactions   Levofloxacin Other (See Comments)    Throat Swelling    Penicillins Anaphylaxis   Other    Penicillin G Other (See Comments)   Sulfa Antibiotics       SOCIAL HISTORY:  Social History   Socioeconomic History   Marital status: Married    Spouse name: Not on file   Number of children: 3   Years of education: Not on file   Highest education level: Not on file  Occupational History   Occupation: retired  Tobacco Use   Smoking status: Never   Smokeless tobacco: Never  Vaping Use   Vaping Use: Never used  Substance and Sexual Activity   Alcohol use: No    Alcohol/week: 0.0 standard drinks   Drug use: No   Sexual activity: Yes  Other Topics Concern   Not on file  Social History Narrative   Not on file   Social Determinants of Health   Financial Resource Strain: Low Risk    Difficulty of Paying Living Expenses: Not hard at all  Food Insecurity: No Food Insecurity  Worried About Charity fundraiser in the Last Year: Never true   [place] in the Last Year: Never true  Transportation Needs: No Transportation Needs   Lack of Transportation (Medical): No   Lack of Transportation (Non-Medical): No  Physical Activity: Sufficiently Active   Days of Exercise per Week: 5 days   Minutes of Exercise per Session: 30 min  Stress: No Stress Concern Present   Feeling of Stress : Not at all  Social Connections: Socially Integrated   Frequency of Communication with Friends and Family: More than three times a week   Frequency of Social Gatherings with Friends and Family: More than three times a week   Attends Religious Services: More than 4 times per year   Active Member of [employer] or Organizations: Yes   Attends Music therapist: More than 4 times per year   Marital Status: Married  Human resources officer Violence: Not At Risk   Fear of Current or Ex-Partner: No   Emotionally Abused: No   Physically Abused: No   Sexually Abused: No     FAMILY HISTORY:  Family History  Problem Relation Age of Onset   Other Mother    Heart attack Father    Diabetes Maternal Grandmother    Breast cancer Paternal Aunt       REVIEW  OF SYSTEMS:  Review of Systems  Constitutional:  Negative for chills and fever.  HENT:  Negative for congestion and sore throat.   Respiratory:  Negative for cough and shortness of breath.   Cardiovascular:  Negative for chest pain and palpitations.  Gastrointestinal:  Negative for abdominal pain, nausea and vomiting.  Skin:        + PD Catheter Site Erythema   VITAL SIGNS:  Temp:  [98 F (36.7 C)-99.3 F (37.4 C)] 99.3 F (37.4 C) (11/17 0035) Pulse Rate:  [78-95] 78 (11/17 0354) Resp:  [16-20] 16 (11/17 0354) BP: (114-145)/(63-87) 114/67 (11/17 0354) SpO2:  [92 %-98 %] 94 % (11/17 0354) Weight:  [55.6 kg] 55.6 kg (11/16 1517)     Height: 5\' 2"  (157.5 cm) Weight: 55.6 kg BMI (Calculated): 22.41   INTAKE/OUTPUT:  No intake/output data recorded.  PHYSICAL EXAM:  Physical Exam Vitals and nursing note reviewed. Exam conducted with a chaperone present.  Constitutional:      General: She is not in acute distress.    Appearance: Normal appearance. She is normal weight. She is not ill-appearing.  HENT:     Head: Normocephalic and atraumatic.     Mouth/Throat:     Mouth: Mucous membranes are moist.     Pharynx: Oropharynx is clear.  Eyes:     Conjunctiva/sclera: Conjunctivae normal.     Pupils: Pupils are equal, round, and reactive to light.  Cardiovascular:     Rate and Rhythm: Normal rate.     Pulses: Normal pulses.  Pulmonary:     Effort: Pulmonary effort is normal. No respiratory distress.  Abdominal:     General: Abdomen is flat. A surgical scar is present. There is no distension.     Palpations: Abdomen is soft.     Tenderness: There is no abdominal tenderness. There is no guarding or rebound.    Genitourinary:    Comments: Deferred Musculoskeletal:     Right lower leg: No edema.     Left lower leg: No edema.  Skin:    General: Skin is warm and dry.     Findings: Erythema present.  Neurological:  General: No focal deficit present.     Mental Status: She is  alert and oriented to person, place, and time.  Psychiatric:        Mood and Affect: Mood normal.        Behavior: Behavior normal.     Labs:  CBC Latest Ref Rng & Units 07/21/2021 07/20/2021 07/04/2021  WBC 4.0 - 10.5 K/uL 3.3(L) 5.6 6.1  Hemoglobin 12.0 - 15.0 g/dL 7.7(L) 8.9(L) 10.9(L)  Hematocrit 36.0 - 46.0 % 24.3(L) 28.8(L) 34.1(L)  Platelets 150 - 400 K/uL 219 240 233   CMP Latest Ref Rng & Units 07/21/2021 07/20/2021 07/04/2021  Glucose 70 - 99 mg/dL 133(H) 304(H) 242(H)  BUN 8 - 23 mg/dL 60(H) 53(H) 50(H)  Creatinine 0.44 - 1.00 mg/dL 8.98(H) 7.68(H) 7.44(H)  Sodium 135 - 145 mmol/L 137 136 134(L)  Potassium 3.5 - 5.1 mmol/L 3.7 3.7 2.7(LL)  Chloride 98 - 111 mmol/L 99 99 96(L)  CO2 22 - 32 mmol/L 24 26 26   Calcium 8.9 - 10.3 mg/dL 7.9(L) 8.0(L) 7.8(L)  Total Protein 6.5 - 8.1 g/dL 6.1(L) 7.0 6.6  Total Bilirubin 0.3 - 1.2 mg/dL 0.7 0.9 0.6  Alkaline Phos 38 - 126 U/L 52 66 72  AST 15 - 41 U/L 16 24 21   ALT 0 - 44 U/L 11 13 12      Imaging studies:  No new pertinent imaging studies   Assessment/Plan: (ICD-10's: T85.611A) 80 y.o. female with likely PD catheter site infection, complicated by pertinent comorbidities including ESRD on PD.   - Appreciate medicine admission - Patient will need PD catheter removal today with Dr Christian Mate pending OR/Anesthesia availability. Given that this also appears to be a superficial site infection, we will consider replacing PD catheter at the same time in a separate location pending feasibility. Will ask PD catheter rep to be present if possible.   - All risks, benefits, and alternatives to above procedure(s) were discussed with the patient, all of her questions were answered to her expressed satisfaction, patient expresses she wishes to proceed, and informed consent was obtained.  - Agree with nephrology consult - NPO today - IV Abx (Cefepime/Vancomycin); follow up peritoneal fluid Cx   - Monitor abdominal examination - Pain  control prn   - Further management per primary service; we will follow   All of the above findings and recommendations were discussed with the patient, and all of patient's questions were answered to her expressed satisfaction.  Thank you for the opportunity to participate in this patient's care.   -- Edison Simon, PA-C [facility] 07/21/2021, 7:50 AM [phone] M-F: 7am - 4pm

## 2021-07-21 NOTE — Anesthesia Preprocedure Evaluation (Addendum)
Anesthesia Evaluation  Patient identified by MRN, date of birth, ID band Patient awake    Reviewed: Allergy & Precautions, H&P , NPO status , Patient's Chart, lab work & pertinent test results, reviewed documented beta blocker date and time   Airway Mallampati: II  TM Distance: >3 FB Neck ROM: full    Dental  (+) Teeth Intact   Pulmonary neg pulmonary ROS,    Pulmonary exam normal        Cardiovascular Exercise Tolerance: Poor hypertension, On Medications negative cardio ROS Normal cardiovascular exam Rhythm:regular Rate:Normal     Neuro/Psych negative neurological ROS  negative psych ROS   GI/Hepatic Neg liver ROS, GERD  Medicated,  Endo/Other  negative endocrine ROSdiabetes, Well Controlled, Type 1, Insulin Dependent  Renal/GU ESRF and DialysisRenal disease  negative genitourinary   Musculoskeletal   Abdominal   Peds  Hematology  (+) Blood dyscrasia, anemia ,   Anesthesia Other Findings Past Medical History: No date: Anemia No date: Arthritis 04/04/2021: B12 deficiency No date: Diabetes mellitus No date: GERD (gastroesophageal reflux disease) No date: HLD (hyperlipidemia) No date: Hypercholesterolemia No date: Hypertension No date: Kidney stones No date: Osteoporosis No date: Proteinuria Past Surgical History: No date: ABDOMINAL HYSTERECTOMY No date: BACK SURGERY     Comment:  lumbar No date: BREAST CYST ASPIRATION     Comment:  unsure of laterality  02/13/2018: CAPD INSERTION; N/A     Comment:  Procedure: LAPAROSCOPIC INSERTION CONTINUOUS AMBULATORY               PERITONEAL DIALYSIS  (CAPD) CATHETER;  Surgeon: Algernon Huxley, MD;  Location: [facility] ORS;  Service: Vascular;                Laterality: N/A; No date: CHOLECYSTECTOMY No date: EYE SURGERY     Comment:  bilateral cataract  No date: NASAL SEPTUM SURGERY BMI    Body Mass Index: 22.42 kg/m     Reproductive/Obstetrics negative  OB ROS                             Anesthesia Physical Anesthesia Plan  ASA: 4 and emergent  Anesthesia Plan: General ETT   Post-op Pain Management:    Induction:   PONV Risk Score and Plan: 4 or greater  Airway Management Planned:   Additional Equipment:   Intra-op Plan:   Post-operative Plan:   Informed Consent: I have reviewed the patients History and Physical, chart, labs and discussed the procedure including the risks, benefits and alternatives for the proposed anesthesia with the patient or authorized representative who has indicated his/her understanding and acceptance.     Dental Advisory Given  Plan Discussed with: CRNA  Anesthesia Plan Comments:        Anesthesia Quick Evaluation

## 2021-07-21 NOTE — OR Nursing (Signed)
Old PD catheter removed. Patient re-prepped and redraped new catheter inserted.  Old PD site isolated with 4x4 and ioban.

## 2021-07-21 NOTE — ED Notes (Signed)
PA student at bedside.

## 2021-07-21 NOTE — Transfer of Care (Signed)
Immediate Anesthesia Transfer of Care Note  Patient: Catherine Burke  Procedure(s) Performed: REMOVAL CONTINUOUS AMBULATORY PERITONEAL DIALYSIS  (CAPD) CATHETER LAPAROSCOPIC INSERTION CONTINUOUS AMBULATORY PERITONEAL DIALYSIS  (CAPD) CATHETER  Patient Location: PACU  Anesthesia Type:General  Level of Consciousness: awake, drowsy and patient cooperative  Airway & Oxygen Therapy: Patient Spontanous Breathing and Patient connected to face mask oxygen  Post-op Assessment: Report given to RN and Post -op Vital signs reviewed and stable  Post vital signs: Reviewed and stable  Last Vitals:  Vitals Value Taken Time  BP 166/69 07/21/21 1820  Temp    Pulse 81 07/21/21 1822  Resp 20 07/21/21 1822  SpO2 99 % 07/21/21 1822  Vitals shown include unvalidated device data.  Last Pain:  Vitals:   07/21/21 1554  TempSrc:   PainSc: 0-No pain         Complications: No notable events documented.

## 2021-07-21 NOTE — Progress Notes (Signed)
[facility]  ROUNDING NOTE   Subjective:   [name] was admitted to [facility] on 07/20/2021 for Cellulitis [L03.90]  Last peritoneal dialysis treatment was 11/15 night.   Patient was working on her recliner and noticed drainage from around her PD catheter.   Patient went to the dialysis clinic where a PD culture was sent and she was asked to present to the ED. Patient found to have her PD cuff out of her exit site and skin consistent with cellulitis. She was then placed on IV vancomycin and cefepime.    Objective:  Vital signs in last 24 hours:  Temp:  [98 F (36.7 C)-99.3 F (37.4 C)] 98.3 F (36.8 C) (11/17 0834) Pulse Rate:  [78-95] 82 (11/17 0834) Resp:  [16-20] 16 (11/17 0834) BP: (114-145)/(63-87) 145/70 (11/17 0834) SpO2:  [92 %-98 %] 95 % (11/17 0834) Weight:  [55.6 kg] 55.6 kg (11/16 1517)  Weight change:  Filed Weights   07/20/21 1517  Weight: 55.6 kg    Intake/Output: No intake/output data recorded.   Intake/Output this shift:  No intake/output data recorded.  Physical Exam: General: NAD, laying in bed  Head: Normocephalic, atraumatic. Moist oral mucosal membranes  Eyes: Anicteric, PERRL  Neck: Supple, trachea midline  Lungs:  Clear to auscultation  Heart: Regular rate and rhythm  Abdomen:  Soft, nontender  Extremities:  no peripheral edema.  Neurologic: Nonfocal, moving all four extremities  Skin: No lesions  Access: PD catheter with exposed cuff and purulent drainage. Erythema at exit site    Basic Metabolic Panel: Recent Labs  Lab 07/20/21 1519 07/21/21 0635  NA 136 137  K 3.7 3.7  CL 99 99  CO2 26 24  GLUCOSE 304* 133*  BUN 53* 60*  CREATININE 7.68* 8.98*  CALCIUM 8.0* 7.9*    Liver Function Tests: Recent Labs  Lab 07/20/21 1519 07/21/21 0635  AST 24 16  ALT 13 11  ALKPHOS 66 52  BILITOT 0.9 0.7  PROT 7.0 6.1*  ALBUMIN 2.4* 2.0*   No results for input(s): LIPASE, AMYLASE in the last 168 hours. No results  for input(s): AMMONIA in the last 168 hours.  CBC: Recent Labs  Lab 07/20/21 1519 07/21/21 0635  WBC 5.6 3.3*  NEUTROABS 4.8  --   HGB 8.9* 7.7*  HCT 28.8* 24.3*  MCV 92.9 93.5  PLT 240 219    Cardiac Enzymes: No results for input(s): CKTOTAL, CKMB, CKMBINDEX, TROPONINI in the last 168 hours.  BNP: Invalid input(s): POCBNP  CBG: Recent Labs  Lab 07/20/21 2128 07/21/21 0827  GLUCAP 106* 125*    Microbiology: Results for orders placed or performed during the hospital encounter of 07/20/21  Blood culture (routine x 2)     Status: None (Preliminary result)   Collection Time: 07/20/21  7:37 PM   Specimen: BLOOD  Result Value Ref Range Status   Specimen Description BLOOD BLOOD LEFT HAND  Final   Special Requests   Final    BOTTLES DRAWN AEROBIC AND ANAEROBIC Blood Culture adequate volume   Culture   Final    NO GROWTH < 24 HOURS Performed at [facility], [address] [place], [place] [address]    Report Status PENDING  Incomplete  Blood culture (routine x 2)     Status: None (Preliminary result)   Collection Time: 07/20/21  7:37 PM   Specimen: BLOOD  Result Value Ref Range Status   Specimen Description BLOOD LEFT ANTECUBITAL  Final   Special  Requests   Final    BOTTLES DRAWN AEROBIC AND ANAEROBIC Blood Culture adequate volume   Culture   Final    NO GROWTH < 24 HOURS Performed at [facility], [address] [place], [place] [address]    Report Status PENDING  Incomplete  Resp Panel by RT-PCR (Flu A&B, Covid) Nasopharyngeal Swab     Status: None   Collection Time: 07/20/21  7:37 PM   Specimen: Nasopharyngeal Swab; Nasopharyngeal(NP) swabs in vial transport medium  Result Value Ref Range Status   SARS Coronavirus 2 by RT PCR NEGATIVE NEGATIVE Final    Comment: (NOTE) SARS-CoV-2 target nucleic acids are NOT DETECTED.  The SARS-CoV-2 RNA is generally detectable in upper respiratory specimens during the acute phase of infection. The  lowest concentration of SARS-CoV-2 viral copies this assay can detect is 138 copies/mL. A negative result does not preclude SARS-Cov-2 infection and should not be used as the sole basis for treatment or other patient management decisions. A negative result may occur with  improper specimen collection/handling, submission of specimen other than nasopharyngeal swab, presence of viral mutation(s) within the areas targeted by this assay, and inadequate number of viral copies(<138 copies/mL). A negative result must be combined with clinical observations, patient history, and epidemiological information. The expected result is Negative.  Fact Sheet for Patients:  EntrepreneurPulse.com.au  Fact Sheet for Healthcare Providers:  IncredibleEmployment.be  This test is no t yet approved or cleared by the [place] FDA and  has been authorized for detection and/or diagnosis of SARS-CoV-2 by FDA under an Emergency Use Authorization (EUA). This EUA will remain  in effect (meaning this test can be used) for the duration of the COVID-19 declaration under Section 564(b)(1) of the Act, 21 U.S.C.section 360bbb-3(b)(1), unless the authorization is terminated  or revoked sooner.       Influenza A by PCR NEGATIVE NEGATIVE Final   Influenza B by PCR NEGATIVE NEGATIVE Final    Comment: (NOTE) The Xpert Xpress SARS-CoV-2/FLU/RSV plus assay is intended as an aid in the diagnosis of influenza from Nasopharyngeal swab specimens and should not be used as a sole basis for treatment. Nasal washings and aspirates are unacceptable for Xpert Xpress SARS-CoV-2/FLU/RSV testing.  Fact Sheet for Patients: EntrepreneurPulse.com.au  Fact Sheet for Healthcare Providers: IncredibleEmployment.be  This test is not yet approved or cleared by the [place] FDA and has been authorized for detection and/or diagnosis of SARS-CoV-2 by FDA under  an Emergency Use Authorization (EUA). This EUA will remain in effect (meaning this test can be used) for the duration of the COVID-19 declaration under Section 564(b)(1) of the Act, 21 U.S.C. section 360bbb-3(b)(1), unless the authorization is terminated or revoked.  Performed at [facility], [address] [place], [place] [address]   MRSA Next Gen by PCR, Nasal     Status: Abnormal   Collection Time: 07/20/21  8:09 PM   Specimen: Nasopharyngeal Swab; Nasal Swab  Result Value Ref Range Status   MRSA by PCR Next Gen DETECTED (A) NOT DETECTED Final    Comment: RESULT CALLED TO, READ BACK BY AND VERIFIED WITH: KATIE ALLRED @2200  ON 07/20/21 SKL (NOTE) The GeneXpert MRSA Assay (FDA approved for NASAL specimens only), is one component of a comprehensive MRSA colonization surveillance program. It is not intended to diagnose MRSA infection nor to guide or monitor treatment for MRSA infections. Test performance is not FDA approved in patients less than 41 years old. Performed at [facility], [address] [place], [place] [address]     Coagulation Studies: No results for input(s): LABPROT, INR in the last 72 hours.  Urinalysis: Recent Labs    07/20/21 1739  COLORURINE YELLOW*  LABSPEC 1.016  PHURINE 7.0  GLUCOSEU 150*  HGBUR SMALL*  BILIRUBINUR NEGATIVE  KETONESUR NEGATIVE  PROTEINUR 100*  NITRITE NEGATIVE  LEUKOCYTESUR LARGE*      Imaging: No results found.   Medications:    ceFEPime (MAXIPIME) IV      amLODipine  2.5 mg Oral Daily   atorvastatin  10 mg Oral Daily   cloNIDine  0.1 mg Oral Daily   heparin  5,000 Units Subcutaneous Q8H   insulin aspart  0-5 Units Subcutaneous QHS   insulin aspart  0-6 Units Subcutaneous TID WC   insulin glargine-yfgn  20 Units Subcutaneous QHS   irbesartan  150 mg Oral Daily   isosorbide mononitrate  30 mg Oral Daily   magnesium oxide  400 mg Oral Daily   multivitamin  1 tablet Oral Daily   senna  2  tablet Oral Daily   vancomycin variable dose per unstable renal function (pharmacist dosing)   Does not apply See admin instructions   acetaminophen **OR** acetaminophen, ondansetron **OR** ondansetron (ZOFRAN) IV  Assessment/ Plan:  [name] is a 80 y.o. white female with end stage renal disease on peritoneal dialysis, hypertension, diabetes mellitus type II insulin dependent, anemia, hyperlipidemia, osteoporosis who is admitted to [facility] on 07/20/2021 for Cellulitis [L03.90]  [facility] 59kg CCPD 5 exchanges 2 liter fills 9 hours.   End stage renal disease on peritoneal dialysis with complication of dialysis device. Patient with cellulitis, exit site infection, most likely tunnel infection.  - Patient did not get peritoneal dialysis overnight due to catheter complication - Appreciate General Surgery input. Plan on PD catheter replacement later today.  - Discussed the possibility of back up hemodialysis with patient   Hypertension with chronic kidney disease: 145/70. Home regimen of amlodipine, telmisartan, clonidine, isosorbide mononitrate, spironolactone.  - telmisartan changed to irbesartan for inpatient.   Diabetes mellitus type II with chronic kidney disease: insulin dependent. Hemoglobin A1c of 6.1% on 06/09/21. - continue glucose control  Anemia with chronic kidney disease: hemoglobin 7.7. Will need her ESA while admitted.   Secondary Hyperparathyroidism: outpatient labs from 11/3 show phosphorus of 4.3, calcium of 7.3 - corrected to 8.2, and pTH 489. Currently not any binders.   Hypokalemia: potassium currently at goal. Continue to monitor.    LOS: 0 Afsheen Antony 11/17/202211:46 AM

## 2021-07-21 NOTE — ED Notes (Signed)
Consent sent with patient

## 2021-07-21 NOTE — Plan of Care (Signed)

## 2021-07-21 NOTE — Progress Notes (Signed)
Pt arrived to unit 2C in stable condition from OR. VSS. Denies pain, states she only has burning at the lap sites. A&O x 4. CMS intact. Lap sites CD&I. Right PD catheter replaced in RLQ of abdomen, dressing CD&I. Per OR nurse, pt tolerating clears w/o N/V. Call bell within reach. Will continue to monitor. Report passed to oncoming nurse Abernathy, RN.

## 2021-07-21 NOTE — Progress Notes (Signed)
PROGRESS NOTE  Catherine Burke:[id number] DOB: 11-20-1940 DOA: 07/20/2021 PCP: Idelle Crouch, MD  HPI/Recap of past 71 hours: 80 year old female with history of end-stage renal disease on peritoneal dialysis (placed in June 2019) as well as hypertension who presented to the emergency room on 11/16 after she reports her catheter accidentally got wrapped around her rocking chair and she felt a pop and it was slightly pulled out although still functional.  This occurred the day prior, but she came in on 11/16 when she noticed some mild abdominal pain as well as erythema and found to have cellulitis.  Patient was admitted to the hospitalist service and started on antibiotics.  General surgery was consulted for assistance in replacement of PD catheter, who plan to take patient to the OR later today.  Patient seen in the emergency room waiting for bed.  Some abdominal discomfort with mild improvement from previous.  Assessment/Plan: Principal Problem:   Abdominal wall cellulitis: Patient does not meet sepsis criteria and sepsis has been ruled out.  Continue with broad-spectrum antibiotics for now.   Active Problems:   Hyperlipidemia: Continue statin.    Essential hypertension: Continue home medications.    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  Stable.    ESRD (end stage renal disease) ([place]) on peritoneal dialysis.  Last dialysis was Tuesday, 11/15.  Peritoneal dialysis catheter to be replaced later this afternoon.  Nephrology has been consulted in the interim.  Code Status: Full code  Family Communication: Left message for daughter  Disposition Plan: Discharge once cellulitis felt to be stable and can go home on oral antibiotics plus peritoneal dialysis catheter replaced and ready for use   Consultants: General surgery Nephrology  Procedures: Replacement of peritoneal dialysis catheter 11/17  Antimicrobials: IV  vancomycin/cefepime/16-present  DVT prophylaxis: Subcu heparin  Level of care: Telemetry Medical   Objective: Vitals:   07/21/21 0354 07/21/21 0834  BP: 114/67 (!) 145/70  Pulse: 78 82  Resp: 16 16  Temp:  98.3 F (36.8 C)  SpO2: 94% 95%   No intake or output data in the 24 hours ending 07/21/21 0959 Filed Weights   07/20/21 1517  Weight: 55.6 kg   Body mass index is 22.42 kg/m.  Exam:  General: Alert and oriented x3, no acute distress HEENT: Normocephalic, atraumatic, mucous membranes are moist Cardiovascular: Regular rate and rhythm, S1-S2 Respiratory: Clear to auscultation bilaterally Abdomen: Soft, mild tenderness on the area of PD cath insertion Musculoskeletal: No clubbing or cyanosis or edema Skin: Raised area of tenderness with erythema at PD site insertion Psychiatry: Appropriate, no evidence of psychoses Neurology: No focal deficits   Data Reviewed: CBC: Recent Labs  Lab 07/20/21 1519 07/21/21 0635  WBC 5.6 3.3*  NEUTROABS 4.8  --   HGB 8.9* 7.7*  HCT 28.8* 24.3*  MCV 92.9 93.5  PLT 240 540   Basic Metabolic Panel: Recent Labs  Lab 07/20/21 1519 07/21/21 0635  NA 136 137  K 3.7 3.7  CL 99 99  CO2 26 24  GLUCOSE 304* 133*  BUN 53* 60*  CREATININE 7.68* 8.98*  CALCIUM 8.0* 7.9*   GFR: Estimated Creatinine Clearance: 3.9 mL/min (A) (by C-G formula based on SCr of 8.98 mg/dL (H)). Liver Function Tests: Recent Labs  Lab 07/20/21 1519 07/21/21 0635  AST 24 16  ALT 13 11  ALKPHOS 66 52  BILITOT 0.9 0.7  PROT 7.0 6.1*  ALBUMIN 2.4* 2.0*  No results for input(s): LIPASE, AMYLASE in the last 168 hours. No results for input(s): AMMONIA in the last 168 hours. Coagulation Profile: No results for input(s): INR, PROTIME in the last 168 hours. Cardiac Enzymes: No results for input(s): CKTOTAL, CKMB, CKMBINDEX, TROPONINI in the last 168 hours. BNP (last 3 results) No results for input(s): PROBNP in the last 8760 hours. HbA1C: Recent  Labs    07/20/21 1519  HGBA1C 6.1*   CBG: Recent Labs  Lab 07/20/21 2128 07/21/21 0827  GLUCAP 106* 125*   Lipid Profile: No results for input(s): CHOL, HDL, LDLCALC, TRIG, CHOLHDL, LDLDIRECT in the last 72 hours. Thyroid Function Tests: No results for input(s): TSH, T4TOTAL, FREET4, T3FREE, THYROIDAB in the last 72 hours. Anemia Panel: No results for input(s): VITAMINB12, FOLATE, FERRITIN, TIBC, IRON, RETICCTPCT in the last 72 hours. Urine analysis:    Component Value Date/Time   COLORURINE YELLOW (A) 07/20/2021 1739   APPEARANCEUR CLOUDY (A) 07/20/2021 1739   LABSPEC 1.016 07/20/2021 1739   PHURINE 7.0 07/20/2021 1739   GLUCOSEU 150 (A) 07/20/2021 1739   HGBUR SMALL (A) 07/20/2021 1739   BILIRUBINUR NEGATIVE 07/20/2021 1739   KETONESUR NEGATIVE 07/20/2021 1739   PROTEINUR 100 (A) 07/20/2021 1739   UROBILINOGEN 0.2 02/24/2015 0853   NITRITE NEGATIVE 07/20/2021 1739   LEUKOCYTESUR LARGE (A) 07/20/2021 1739   Sepsis Labs: @LABRCNTIP (procalcitonin:4,lacticidven:4)  ) Recent Results (from the past 240 hour(s))  Blood culture (routine x 2)     Status: None (Preliminary result)   Collection Time: 07/20/21  7:37 PM   Specimen: BLOOD  Result Value Ref Range Status   Specimen Description BLOOD BLOOD LEFT HAND  Final   Special Requests   Final    BOTTLES DRAWN AEROBIC AND ANAEROBIC Blood Culture adequate volume   Culture   Final    NO GROWTH < 24 HOURS Performed at [facility], [address], [place], [place] [address]    Report Status PENDING  Incomplete  Blood culture (routine x 2)     Status: None (Preliminary result)   Collection Time: 07/20/21  7:37 PM   Specimen: BLOOD  Result Value Ref Range Status   Specimen Description BLOOD LEFT ANTECUBITAL  Final   Special Requests   Final    BOTTLES DRAWN AEROBIC AND ANAEROBIC Blood Culture adequate volume   Culture   Final    NO GROWTH < 24 HOURS Performed at [facility], [address]  [place], [place] [address]    Report Status PENDING  Incomplete  Resp Panel by RT-PCR (Flu A&B, Covid) Nasopharyngeal Swab     Status: None   Collection Time: 07/20/21  7:37 PM   Specimen: Nasopharyngeal Swab; Nasopharyngeal(NP) swabs in vial transport medium  Result Value Ref Range Status   SARS Coronavirus 2 by RT PCR NEGATIVE NEGATIVE Final    Comment: (NOTE) SARS-CoV-2 target nucleic acids are NOT DETECTED.  The SARS-CoV-2 RNA is generally detectable in upper respiratory specimens during the acute phase of infection. The lowest concentration of SARS-CoV-2 viral copies this assay can detect is 138 copies/mL. A negative result does not preclude SARS-Cov-2 infection and should not be used as the sole basis for treatment or other patient management decisions. A negative result may occur with  improper specimen collection/handling, submission of specimen other than nasopharyngeal swab, presence of viral mutation(s) within the areas targeted by this assay, and inadequate number of viral copies(<138 copies/mL). A negative result must be combined with clinical observations, patient history, and epidemiological  information. The expected result is Negative.  Fact Sheet for Patients:  EntrepreneurPulse.com.au  Fact Sheet for Healthcare Providers:  IncredibleEmployment.be  This test is no t yet approved or cleared by the [place] FDA and  has been authorized for detection and/or diagnosis of SARS-CoV-2 by FDA under an Emergency Use Authorization (EUA). This EUA will remain  in effect (meaning this test can be used) for the duration of the COVID-19 declaration under Section 564(b)(1) of the Act, 21 U.S.C.section 360bbb-3(b)(1), unless the authorization is terminated  or revoked sooner.       Influenza A by PCR NEGATIVE NEGATIVE Final   Influenza B by PCR NEGATIVE NEGATIVE Final    Comment: (NOTE) The Xpert Xpress SARS-CoV-2/FLU/RSV plus assay is  intended as an aid in the diagnosis of influenza from Nasopharyngeal swab specimens and should not be used as a sole basis for treatment. Nasal washings and aspirates are unacceptable for Xpert Xpress SARS-CoV-2/FLU/RSV testing.  Fact Sheet for Patients: EntrepreneurPulse.com.au  Fact Sheet for Healthcare Providers: IncredibleEmployment.be  This test is not yet approved or cleared by the [place] FDA and has been authorized for detection and/or diagnosis of SARS-CoV-2 by FDA under an Emergency Use Authorization (EUA). This EUA will remain in effect (meaning this test can be used) for the duration of the COVID-19 declaration under Section 564(b)(1) of the Act, 21 U.S.C. section 360bbb-3(b)(1), unless the authorization is terminated or revoked.  Performed at [facility], [address] [place], [place] [address]   MRSA Next Gen by PCR, Nasal     Status: Abnormal   Collection Time: 07/20/21  8:09 PM   Specimen: Nasopharyngeal Swab; Nasal Swab  Result Value Ref Range Status   MRSA by PCR Next Gen DETECTED (A) NOT DETECTED Final    Comment: RESULT CALLED TO, READ BACK BY AND VERIFIED WITH: KATIE ALLRED @2200  ON 07/20/21 SKL (NOTE) The GeneXpert MRSA Assay (FDA approved for NASAL specimens only), is one component of a comprehensive MRSA colonization surveillance program. It is not intended to diagnose MRSA infection nor to guide or monitor treatment for MRSA infections. Test performance is not FDA approved in patients less than 67 years old. Performed at [facility], [address], [place], [place] [address]       Studies: No results found.  Scheduled Meds:  amLODipine  2.5 mg Oral Daily   atorvastatin  10 mg Oral Daily   cloNIDine  0.1 mg Oral Daily   heparin  5,000 Units Subcutaneous Q8H   insulin aspart  0-5 Units Subcutaneous QHS   insulin aspart  0-6 Units Subcutaneous TID WC   insulin glargine-yfgn  20  Units Subcutaneous QHS   irbesartan  150 mg Oral Daily   isosorbide mononitrate  30 mg Oral Daily   magnesium oxide  400 mg Oral Daily   multivitamin  1 tablet Oral Daily   senna  2 tablet Oral Daily   vancomycin variable dose per unstable renal function (pharmacist dosing)   Does not apply See admin instructions    Continuous Infusions:  ceFEPime (MAXIPIME) IV       LOS: 0 days     Annita Brod, MD [facility]   07/21/2021, 9:59 AM

## 2021-07-21 NOTE — Op Note (Signed)
Removal of partially displaced/infected continuous ambulatory peritoneal dialysis catheter and Laparoscopic, continuous ambulatory peritoneal dialysis catheter placement.  Pre-operative Diagnosis: Partially displaced/infected CAPD catheter, chronic renal failure.   Post-operative Diagnosis: same.   Surgeon: Ronny Bacon, M.D., [facility]  Anesthesia: General endotracheal  Findings: Excellent dialysate flow  Estimated Blood Loss: 10 mL         Specimens: None          Complications: none              Procedure Details  The patient was seen again in the Holding Room. The benefits, complications, treatment options, and expected outcomes were discussed with the patient. The risks of bleeding, infection, recurrence of symptoms, failure to resolve symptoms, unanticipated injury, prosthetic placement, prosthetic malfunction, prosthetic infection, any of which could require further surgery were reviewed with the patient. The likelihood of improving the patient's symptoms/condition is good.  The patient and/or family concurred with the proposed plan, giving informed consent.  The patient was taken to Operating Room, identified and the procedure verified.    Prior to the induction of general anesthesia, antibiotic prophylaxis was administered. VTE prophylaxis was in place.  General anesthesia was then administered and tolerated well. After the induction, the patient was positioned in the supine position and the abdomen was prepped with Chloraprep and draped in the sterile fashion.  A Time Out was held and the above information confirmed. The abdomen was prepped and draped in usual sterile manner for removal of the previous dialysis catheter.  The cuff at the junction with the fascia near the umbilical area was palpated.  Incision was made directly over this area, and the cuff was sharply excised from the adjacent soft tissue scar, and anterior rectus sheath.  The remaining catheter was divided, and  removed.  This incision was then closed with subcuticulars of 4-0 Monocryl.  The partially displaced catheter was removed from its exit site.  The site was then left open for drainage. The abdomen was reprepped and draped with Ioban isolating the old catheter site. A stab incision was made left upper quadrant.  An optical 5 mm trocar was placed under direct visualization in the left upper quadrant abdominal wall.   A second trocar was placed under direct visualization inferiorly on the left side.  A transverse incision was made just cephalad to the umbilical level on the patient's right rectus.  This allowed the top of the coil to align with the symphysis pubis.  We then cut down to the anterior rectus sheath.  Utilizing a 5 mm applied optical port, I passed the trocar first through the anterior rectus sheath and then tangentially deep to the rectus muscle the maximal distance toward the symphysis prior to penetrating into the peritoneal cavity, all under direct visualization. I then placed the coil of the peritoneal dialysis catheter through this port into the pelvis and advanced the cuff under the anterior rectus sheath.  I then measured the angled catheter for the length to rise and make a lateral turn inferior to the right costal margin, and then to extend laterally and somewhat inferiorly from that point.  These points were marked.  I made a counterincision where the catheter would arch/middle cuff would lie, cut the catheters after measuring and secured the catheters using the intraluminal connector with Prolene.   I then tunneled the catheter to the counterincision.  Ensuring the catheter remained without kinks or twists along the subcutaneous course.  I then tracked it in the same  subcutaneous plane laterally parallel to the costal margin for skin penetration with the trocar alone.  The catheter was then brought out through that site. We then evaluated the inflow of the dialysate and its gravitational  drainage.  This was free flowing.  A pursestring of 0-Vicryl is used to snug the fascia around the catheter.  Once this was completed we then removed the remaining trochars, along with the CO2, and closed the skin incisions with interrupted and running subcuticulars of 4-0 Monocryl, sealing them all with Dermabond. A biodisk was applied to the catheter exit site, and it was dressed with sponge secured, and the catheter with connectors and betadine cap  atop the primary dressing and covered with ABD.  She was subsequently extubated and brought to the PACU in stable condition.  She tolerated the procedure well.  NOTE: anticipating smaller volume diasylate instillations with the new catheter placement.     Ronny Bacon M.D., [facility] 01/18/2021 9:13 AM

## 2021-07-22 ENCOUNTER — Encounter: Payer: Self-pay | Admitting: Surgery

## 2021-07-22 DIAGNOSIS — T85611A Breakdown (mechanical) of intraperitoneal dialysis catheter, initial encounter: Secondary | ICD-10-CM | POA: Diagnosis not present

## 2021-07-22 DIAGNOSIS — Z992 Dependence on renal dialysis: Secondary | ICD-10-CM | POA: Diagnosis not present

## 2021-07-22 DIAGNOSIS — I10 Essential (primary) hypertension: Secondary | ICD-10-CM

## 2021-07-22 DIAGNOSIS — L03311 Cellulitis of abdominal wall: Secondary | ICD-10-CM | POA: Diagnosis not present

## 2021-07-22 LAB — VANCOMYCIN, RANDOM: Vancomycin Rm: 18

## 2021-07-22 LAB — BASIC METABOLIC PANEL
Anion gap: 15 (ref 5–15)
BUN: 72 mg/dL — ABNORMAL HIGH (ref 8–23)
CO2: 19 mmol/L — ABNORMAL LOW (ref 22–32)
Calcium: 7.9 mg/dL — ABNORMAL LOW (ref 8.9–10.3)
Chloride: 102 mmol/L (ref 98–111)
Creatinine, Ser: 9.03 mg/dL — ABNORMAL HIGH (ref 0.44–1.00)
GFR, Estimated: 4 mL/min — ABNORMAL LOW (ref >60)
Glucose, Bld: 194 mg/dL — ABNORMAL HIGH (ref 70–99)
Potassium: 5 mmol/L (ref 3.5–5.1)
Sodium: 136 mmol/L (ref 135–145)

## 2021-07-22 LAB — GLUCOSE, CAPILLARY
Glucose-Capillary: 168 mg/dL — ABNORMAL HIGH (ref 70–99)
Glucose-Capillary: 184 mg/dL — ABNORMAL HIGH (ref 70–99)
Glucose-Capillary: 238 mg/dL — ABNORMAL HIGH (ref 70–99)
Glucose-Capillary: 174 mg/dL — ABNORMAL HIGH (ref 70–99)

## 2021-07-22 LAB — CBC
HCT: 25.5 % — ABNORMAL LOW (ref 36.0–46.0)
Hemoglobin: 7.5 g/dL — ABNORMAL LOW (ref 12.0–15.0)
MCH: 28.3 pg (ref 26.0–34.0)
MCHC: 29.4 g/dL — ABNORMAL LOW (ref 30.0–36.0)
MCV: 96.2 fL (ref 80.0–100.0)
Platelets: 260 10*3/uL (ref 150–400)
RBC: 2.65 MIL/uL — ABNORMAL LOW (ref 3.87–5.11)
RDW: 17.3 % — ABNORMAL HIGH (ref 11.5–15.5)
WBC: 4.1 10*3/uL (ref 4.0–10.5)
nRBC: 0 % (ref 0.0–0.2)

## 2021-07-22 MED ORDER — VANCOMYCIN HCL 1000 MG/200ML IV SOLN
1000.0000 mg | Freq: Once | INTRAVENOUS | Status: AC
  Administered 2021-07-23: 1000 mg via INTRAVENOUS
  Filled 2021-07-22: qty 200

## 2021-07-22 MED ORDER — VANCOMYCIN HCL 1250 MG/250ML IV SOLN
1250.0000 mg | Freq: Once | INTRAVENOUS | Status: DC
  Filled 2021-07-22: qty 250

## 2021-07-22 MED ORDER — DELFLEX-LC/1.5% DEXTROSE 344 MOSM/L IP SOLN
INTRAPERITONEAL | Status: DC
  Filled 2021-07-22 (×2): qty 3000

## 2021-07-22 MED ORDER — GENTAMICIN SULFATE 0.1 % EX CREA
1.0000 "application " | TOPICAL_CREAM | Freq: Every day | CUTANEOUS | Status: DC
  Administered 2021-07-22 – 2021-07-23 (×2): 1 via TOPICAL
  Filled 2021-07-22: qty 15

## 2021-07-22 MED ORDER — ALUM & MAG HYDROXIDE-SIMETH 200-200-20 MG/5ML PO SUSP
30.0000 mL | ORAL | Status: DC | PRN
  Administered 2021-07-22 – 2021-07-23 (×3): 30 mL via ORAL
  Filled 2021-07-22 (×3): qty 30

## 2021-07-22 MED ORDER — POLYETHYLENE GLYCOL 3350 17 G PO PACK
17.0000 g | PACK | Freq: Every day | ORAL | Status: DC
  Administered 2021-07-22 – 2021-07-23 (×2): 17 g via ORAL
  Filled 2021-07-22 (×2): qty 1

## 2021-07-22 MED ORDER — PANTOPRAZOLE SODIUM 40 MG PO TBEC
40.0000 mg | DELAYED_RELEASE_TABLET | Freq: Every day | ORAL | Status: DC
  Administered 2021-07-22 – 2021-07-23 (×2): 40 mg via ORAL
  Filled 2021-07-22 (×2): qty 1

## 2021-07-22 MED ORDER — CHLORHEXIDINE GLUCONATE CLOTH 2 % EX PADS: 6.0000 | MEDICATED_PAD | Freq: Every day | CUTANEOUS | Status: DC

## 2021-07-22 MED ORDER — MUPIROCIN 2 % EX OINT
1.0000 "application " | TOPICAL_OINTMENT | Freq: Two times a day (BID) | CUTANEOUS | Status: DC
  Administered 2021-07-22 – 2021-07-23 (×3): 1 via NASAL
  Filled 2021-07-22: qty 22

## 2021-07-22 NOTE — Progress Notes (Signed)
PROGRESS NOTE  Catherine Burke MVH:[id number] DOB: [birth year] DOA: 07/20/2021 PCP: Idelle Crouch, MD  HPI/Recap of past 70 hours: 80 year old female with history of end-stage renal disease on peritoneal dialysis (placed in June 2019) as well as hypertension who presented to the emergency room on 11/16 after she reports her catheter accidentally got wrapped around her rocking chair and she felt a pop and it was slightly pulled out although still functional.  This occurred the day prior, but she came in on 11/16 when she noticed some mild abdominal pain as well as erythema and found to have cellulitis.  Patient was admitted to the hospitalist service and started on antibiotics.  General surgery consulted, replaced PD catheter on late afternoon of 11/17.  Today, patient is doing okay.  She has not yet started peritoneal dialysis  Assessment/Plan: Principal Problem:   Abdominal wall cellulitis: Patient does not meet sepsis criteria and sepsis has been ruled out.  Responding to antibiotics Active Problems:   Hyperlipidemia: Continue statin.    Essential hypertension: Continue home medications.    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  Stable.    ESRD (end stage renal disease) ([place]) on peritoneal dialysis.  Last dialysis was Tuesday, 11/15.  Peritoneal dialysis catheter replaced.  Nephrology following.  Plan for low fill volumes tonight and from here on out.  Code Status: Full code  Family Communication: Left message for daughter  Disposition Plan: Discharge tomorrow assuming patient tolerates dialysis   Consultants: General surgery Nephrology  Procedures: Replacement of peritoneal dialysis catheter 11/17 Peritoneal dialysis 11/18  Antimicrobials: IV vancomycin/cefepime 11/16-present  DVT prophylaxis: Subcu heparin  Level of care: Telemetry Medical   Objective: Vitals:   07/22/21 0543 07/22/21 0852  BP: (!)  172/74 (!) 160/70  Pulse: 78 81  Resp:  20  Temp: (!) 97.5 F (36.4 C) 97.8 F (36.6 C)  SpO2: 100% 98%    Intake/Output Summary (Last 24 hours) at 07/22/2021 1428 Last data filed at 07/22/2021 1353 Gross per 24 hour  Intake 880 ml  Output --  Net 880 ml   Filed Weights   07/20/21 1517 07/21/21 1554  Weight: 55.6 kg 54.9 kg   Body mass index is 20.77 kg/m.  Exam:  General: Alert and oriented x3, no acute distress HEENT: Normocephalic, atraumatic, mucous membranes are moist Cardiovascular: Regular rate and rhythm, S1-S2 Respiratory: Clear to auscultation bilaterally Abdomen: Soft, mild tenderness on the area of previous PD cath insertion Musculoskeletal: No clubbing or cyanosis or edema Skin: Raised area of tenderness with improving erythema at PD site insertion Psychiatry: Appropriate, no evidence of psychoses Neurology: No focal deficits   Data Reviewed: CBC: Recent Labs  Lab 07/20/21 1519 07/21/21 0635 07/22/21 0506  WBC 5.6 3.3* 4.1  NEUTROABS 4.8  --   --   HGB 8.9* 7.7* 7.5*  HCT 28.8* 24.3* 25.5*  MCV 92.9 93.5 96.2  PLT 240 219 841    Basic Metabolic Panel: Recent Labs  Lab 07/20/21 1519 07/21/21 0635 07/22/21 0506  NA 136 137 136  K 3.7 3.7 5.0  CL 99 99 102  CO2 26 24 19*  GLUCOSE 304* 133* 194*  BUN 53* 60* 72*  CREATININE 7.68* 8.98* 9.03*  CALCIUM 8.0* 7.9* 7.9*    GFR: Estimated Creatinine Clearance: 4.2 mL/min (A) (by C-G formula based on SCr of 9.03 mg/dL (H)). Liver Function Tests: Recent Labs  Lab 07/20/21 1519 07/21/21 0635  AST 24 16  ALT 13 11  ALKPHOS 66 52  BILITOT 0.9 0.7  PROT 7.0 6.1*  ALBUMIN 2.4* 2.0*    No results for input(s): LIPASE, AMYLASE in the last 168 hours. No results for input(s): AMMONIA in the last 168 hours. Coagulation Profile: No results for input(s): INR, PROTIME in the last 168 hours. Cardiac Enzymes: No results for input(s): CKTOTAL, CKMB, CKMBINDEX, TROPONINI in the last 168  hours. BNP (last 3 results) No results for input(s): PROBNP in the last 8760 hours. HbA1C: Recent Labs    07/20/21 1519  HGBA1C 6.1*    CBG: Recent Labs  Lab 07/21/21 1601 07/21/21 1822 07/21/21 2124 07/22/21 0853 07/22/21 1147  GLUCAP 140* 163* 167* 168* 184*    Lipid Profile: No results for input(s): CHOL, HDL, LDLCALC, TRIG, CHOLHDL, LDLDIRECT in the last 72 hours. Thyroid Function Tests: No results for input(s): TSH, T4TOTAL, FREET4, T3FREE, THYROIDAB in the last 72 hours. Anemia Panel: No results for input(s): VITAMINB12, FOLATE, FERRITIN, TIBC, IRON, RETICCTPCT in the last 72 hours. Urine analysis:    Component Value Date/Time   COLORURINE YELLOW (A) 07/20/2021 1739   APPEARANCEUR CLOUDY (A) 07/20/2021 1739   LABSPEC 1.016 07/20/2021 1739   PHURINE 7.0 07/20/2021 1739   GLUCOSEU 150 (A) 07/20/2021 1739   HGBUR SMALL (A) 07/20/2021 1739   BILIRUBINUR NEGATIVE 07/20/2021 1739   KETONESUR NEGATIVE 07/20/2021 1739   PROTEINUR 100 (A) 07/20/2021 1739   UROBILINOGEN 0.2 02/24/2015 0853   NITRITE NEGATIVE 07/20/2021 1739   LEUKOCYTESUR LARGE (A) 07/20/2021 1739   Sepsis Labs: @LABRCNTIP (procalcitonin:4,lacticidven:4)  ) Recent Results (from the past 240 hour(s))  Blood culture (routine x 2)     Status: None (Preliminary result)   Collection Time: 07/20/21  7:37 PM   Specimen: BLOOD  Result Value Ref Range Status   Specimen Description BLOOD BLOOD LEFT HAND  Final   Special Requests   Final    BOTTLES DRAWN AEROBIC AND ANAEROBIC Blood Culture adequate volume   Culture   Final    NO GROWTH 2 DAYS Performed at [facility], [address], [place], [place] [address]    Report Status PENDING  Incomplete  Blood culture (routine x 2)     Status: None (Preliminary result)   Collection Time: 07/20/21  7:37 PM   Specimen: BLOOD  Result Value Ref Range Status   Specimen Description BLOOD LEFT ANTECUBITAL  Final   Special Requests   Final    BOTTLES  DRAWN AEROBIC AND ANAEROBIC Blood Culture adequate volume   Culture   Final    NO GROWTH 2 DAYS Performed at [facility], [address], [place], [place] [address]    Report Status PENDING  Incomplete  Resp Panel by RT-PCR (Flu A&B, Covid) Nasopharyngeal Swab     Status: None   Collection Time: 07/20/21  7:37 PM   Specimen: Nasopharyngeal Swab; Nasopharyngeal(NP) swabs in vial transport medium  Result Value Ref Range Status   SARS Coronavirus 2 by RT PCR NEGATIVE NEGATIVE Final    Comment: (NOTE) SARS-CoV-2 target nucleic acids are NOT DETECTED.  The SARS-CoV-2 RNA is generally detectable in upper respiratory specimens during the acute phase of infection. The lowest concentration of SARS-CoV-2 viral copies this assay can detect is 138 copies/mL. A negative result does not preclude SARS-Cov-2 infection and should not be used as the sole basis for treatment or other patient management decisions. A negative result may occur with  improper specimen collection/handling, submission of specimen other than nasopharyngeal swab,  presence of viral mutation(s) within the areas targeted by this assay, and inadequate number of viral copies(<138 copies/mL). A negative result must be combined with clinical observations, patient history, and epidemiological information. The expected result is Negative.  Fact Sheet for Patients:  EntrepreneurPulse.com.au  Fact Sheet for Healthcare Providers:  IncredibleEmployment.be  This test is no t yet approved or cleared by the [place] FDA and  has been authorized for detection and/or diagnosis of SARS-CoV-2 by FDA under an Emergency Use Authorization (EUA). This EUA will remain  in effect (meaning this test can be used) for the duration of the COVID-19 declaration under Section 564(b)(1) of the Act, 21 U.S.C.section 360bbb-3(b)(1), unless the authorization is terminated  or revoked sooner.        Influenza A by PCR NEGATIVE NEGATIVE Final   Influenza B by PCR NEGATIVE NEGATIVE Final    Comment: (NOTE) The Xpert Xpress SARS-CoV-2/FLU/RSV plus assay is intended as an aid in the diagnosis of influenza from Nasopharyngeal swab specimens and should not be used as a sole basis for treatment. Nasal washings and aspirates are unacceptable for Xpert Xpress SARS-CoV-2/FLU/RSV testing.  Fact Sheet for Patients: EntrepreneurPulse.com.au  Fact Sheet for Healthcare Providers: IncredibleEmployment.be  This test is not yet approved or cleared by the [place] FDA and has been authorized for detection and/or diagnosis of SARS-CoV-2 by FDA under an Emergency Use Authorization (EUA). This EUA will remain in effect (meaning this test can be used) for the duration of the COVID-19 declaration under Section 564(b)(1) of the Act, 21 U.S.C. section 360bbb-3(b)(1), unless the authorization is terminated or revoked.  Performed at [facility], [address] [place], [place] [address]   MRSA Next Gen by PCR, Nasal     Status: Abnormal   Collection Time: 07/20/21  8:09 PM   Specimen: Nasopharyngeal Swab; Nasal Swab  Result Value Ref Range Status   MRSA by PCR Next Gen DETECTED (A) NOT DETECTED Final    Comment: RESULT CALLED TO, READ BACK BY AND VERIFIED WITH: KATIE ALLRED @2200  ON 07/20/21 SKL (NOTE) The GeneXpert MRSA Assay (FDA approved for NASAL specimens only), is one component of a comprehensive MRSA colonization surveillance program. It is not intended to diagnose MRSA infection nor to guide or monitor treatment for MRSA infections. Test performance is not FDA approved in patients less than 45 years old. Performed at [facility], [address] [place], [place] [address]       Studies: No results found.  Scheduled Meds:  amLODipine  2.5 mg Oral Daily   atorvastatin  10 mg Oral Daily   [START ON 07/23/2021]  Chlorhexidine Gluconate Cloth  6 each Topical Q0600   cloNIDine  0.1 mg Oral Daily   heparin  5,000 Units Subcutaneous Q8H   insulin aspart  0-5 Units Subcutaneous QHS   insulin aspart  0-6 Units Subcutaneous TID WC   insulin glargine-yfgn  20 Units Subcutaneous QHS   irbesartan  150 mg Oral Daily   isosorbide mononitrate  30 mg Oral Daily   magnesium oxide  400 mg Oral Daily   multivitamin  1 tablet Oral Daily   mupirocin ointment  1 application Nasal BID   polyethylene glycol  17 g Oral Daily   senna  2 tablet Oral Daily   vancomycin variable dose per unstable renal function (pharmacist dosing)   Does not apply See admin instructions    Continuous Infusions:  ceFEPime (MAXIPIME) IV       LOS: 0 days  Annita Brod, MD [facility]   07/22/2021, 2:28 PM

## 2021-07-22 NOTE — Progress Notes (Signed)
Peritoneal dialysis patient known at [facility]. Education provided, no dialysis concerns stated. Please contact me with any dialysis placement concerns.  Elvera Bicker Dialysis Coordinator [phone]

## 2021-07-22 NOTE — Progress Notes (Signed)
[facility] Day(s): 0.   Post op day(s): 1 Day Post-Op.   Interval History:  Patient seen and examined No acute events or new complaints overnight.  Patient reports she is feeling better No fever, chills, nausea, emesis Labs are consistent with ESRD otherwise are reassuring On CLD Continues on Cefepime/Vancomycin  Vital signs in last 24 hours: [min-max] current  Temp:  [97.5 F (36.4 C)-98.3 F (36.8 C)] 97.5 F (36.4 C) (11/18 0543) Pulse Rate:  [78-98] 78 (11/18 0543) Resp:  [16-22] 16 (11/17 2338) BP: (128-172)/(54-74) 172/74 (11/18 0543) SpO2:  [95 %-100 %] 100 % (11/18 0543) Weight:  [54.9 kg] 54.9 kg (11/17 1554)     Height: 5\' 4"  (162.6 cm) Weight: 54.9 kg BMI (Calculated): 20.76   Intake/Output last 2 shifts:  11/17 0701 - 11/18 0700 In: 400 [I.V.:400] Out: -    Physical Exam:  Constitutional: alert, cooperative and no distress  Respiratory: breathing non-labored at rest  Cardiovascular: regular rate and sinus rhythm  Gastrointestinal: soft, non-tender, and non-distended. PD catheter replaced in right abdomen site CDI Integumentary: Laparoscopic incisions are CDI with dermabond, no erythema  Labs:  CBC Latest Ref Rng & Units 07/22/2021 07/21/2021 07/20/2021  WBC 4.0 - 10.5 K/uL 4.1 3.3(L) 5.6  Hemoglobin 12.0 - 15.0 g/dL 7.5(L) 7.7(L) 8.9(L)  Hematocrit 36.0 - 46.0 % 25.5(L) 24.3(L) 28.8(L)  Platelets 150 - 400 K/uL 260 219 240   CMP Latest Ref Rng & Units 07/22/2021 07/21/2021 07/20/2021  Glucose 70 - 99 mg/dL 194(H) 133(H) 304(H)  BUN 8 - 23 mg/dL 72(H) 60(H) 53(H)  Creatinine 0.44 - 1.00 mg/dL 9.03(H) 8.98(H) 7.68(H)  Sodium 135 - 145 mmol/L 136 137 136  Potassium 3.5 - 5.1 mmol/L 5.0 3.7 3.7  Chloride 98 - 111 mmol/L 102 99 99  CO2 22 - 32 mmol/L 19(L) 24 26  Calcium 8.9 - 10.3 mg/dL 7.9(L) 7.9(L) 8.0(L)  Total Protein 6.5 - 8.1 g/dL - 6.1(L) 7.0  Total Bilirubin 0.3 - 1.2 mg/dL - 0.7 0.9  Alkaline  Phos 38 - 126 U/L - 52 66  AST 15 - 41 U/L - 16 24  ALT 0 - 44 U/L - 11 13     Imaging studies: No new pertinent imaging studies   Assessment/Plan:  80 y.o. female 1 Day Post-Op s/p removal of ambulatory peritoneal and replacement of PD catheter for ESRD   - Okay to access PD catheter today; small volume only    - Okay to advance diet as tolerated   - Continue IV Abx (Cefepime/Vancomycin); okay to deescalate - Pain control prn   - Further management per primary service  - We will sign off; she can follow up in 1-2 weeks for re-check   All of the above findings and recommendations were discussed with the patient, and the medical team, and all of patient's questions were answered to her expressed satisfaction.  -- Edison Simon, PA-C [facility] 07/22/2021, 8:24 AM [phone] M-F: 7am - 4pm

## 2021-07-22 NOTE — Progress Notes (Signed)
Pharmacy Antibiotic Note  Catherine Burke is a 80 y.o. female admitted on 07/20/2021 with  SBP vs cellulitis .  Pharmacy has been consulted for vancomycin and cefepime dosing.  Patient with ESRD on PD. Developed pain and redness with drainage at insertion site after inadvertently pulling out catheter.  Vancomycin 1250 mg loading dose (20-25 mg/kg for PD patients) and cefepime x 1 gram in ED. Noted allergy to penicillin (anaphylaxis in 2014), though patient has tolerated ceftriaxone in the more recent past.   Plan: Vancomycin random 18 mcg/ml, will order 1000 mg x 1 (maintenance dose 15-20mg /kg)  Goal trough 15-20 mcg/ml Continue vancomycin variable dosing  Continue cefepime 1 gram every 24 hours  Monitor nephrology plans, clinical course, LOT.  Height: 5\' 4"  (162.6 cm) Weight: 54.9 kg (121 lb) IBW/kg (Calculated) : 54.7 Obtain 48-hour vancomycin level (11/18 PM).   Temp (24hrs), Avg:97.9 F (36.6 C), Min:97.5 F (36.4 C), Max:98.2 F (36.8 C)  Recent Labs  Lab 07/20/21 1519 07/20/21 1739 07/21/21 0635 07/22/21 0506 07/22/21 2016  WBC 5.6  --  3.3* 4.1  --   CREATININE 7.68*  --  8.98* 9.03*  --   LATICACIDVEN 2.4* 1.7  --   --   --   VANCORANDOM  --   --   --   --  18     Estimated Creatinine Clearance: 4.2 mL/min (A) (by C-G formula based on SCr of 9.03 mg/dL (H)).    Allergies  Allergen Reactions   Levofloxacin Other (See Comments)    Throat Swelling    Penicillins Anaphylaxis   Other    Penicillin G Other (See Comments)   Sulfa Antibiotics     Antimicrobials this admission: 11/16 vancomycin >>  11/16 cefepime >>   Dose adjustments this admission: N/A  Microbiology results: 11/16 BCx: NGTD 11/16 MRSA PCR: Positive  Thank you for allowing pharmacy to be a part of this patient's care.  Darnelle Bos, PharmD 07/22/2021 9:20 PM

## 2021-07-22 NOTE — Plan of Care (Signed)
Continuing with plan of care. 

## 2021-07-22 NOTE — Progress Notes (Signed)
[facility]  ROUNDING NOTE   Subjective:   PD catheter replaced by Dr. Christian Mate on 11/17.   Patient did not get peritoneal dialysis last night.    Objective:  Vital signs in last 24 hours:  Temp:  [97.5 F (36.4 C)-98.2 F (36.8 C)] 97.8 F (36.6 C) (11/18 0852) Pulse Rate:  [78-98] 81 (11/18 0852) Resp:  [16-22] 20 (11/18 0852) BP: (128-172)/(54-74) 160/70 (11/18 0852) SpO2:  [95 %-100 %] 98 % (11/18 0852) Weight:  [54.9 kg] 54.9 kg (11/17 1554)  Weight change: -0.715 kg Filed Weights   07/20/21 1517 07/21/21 1554  Weight: 55.6 kg 54.9 kg    Intake/Output: I/O last 3 completed shifts: In: 400 [I.V.:400] Out: -    Intake/Output this shift:  Total I/O In: 240 [P.O.:240] Out: -   Physical Exam: General: NAD, laying in bed  Head: Normocephalic, atraumatic. Moist oral mucosal membranes  Eyes: Anicteric, PERRL  Neck: Supple, trachea midline  Lungs:  Clear to auscultation  Heart: Regular rate and rhythm  Abdomen:  Soft, nontender  Extremities:  no peripheral edema.  Neurologic: Nonfocal, moving all four extremities  Skin: No lesions  Access: PD catheter with clean incisions RLQ.     Basic Metabolic Panel: Recent Labs  Lab 07/20/21 1519 07/21/21 0635 07/22/21 0506  NA 136 137 136  K 3.7 3.7 5.0  CL 99 99 102  CO2 26 24 19*  GLUCOSE 304* 133* 194*  BUN 53* 60* 72*  CREATININE 7.68* 8.98* 9.03*  CALCIUM 8.0* 7.9* 7.9*     Liver Function Tests: Recent Labs  Lab 07/20/21 1519 07/21/21 0635  AST 24 16  ALT 13 11  ALKPHOS 66 52  BILITOT 0.9 0.7  PROT 7.0 6.1*  ALBUMIN 2.4* 2.0*    No results for input(s): LIPASE, AMYLASE in the last 168 hours. No results for input(s): AMMONIA in the last 168 hours.  CBC: Recent Labs  Lab 07/20/21 1519 07/21/21 0635 07/22/21 0506  WBC 5.6 3.3* 4.1  NEUTROABS 4.8  --   --   HGB 8.9* 7.7* 7.5*  HCT 28.8* 24.3* 25.5*  MCV 92.9 93.5 96.2  PLT 240 219 260     Cardiac Enzymes: No results  for input(s): CKTOTAL, CKMB, CKMBINDEX, TROPONINI in the last 168 hours.  BNP: Invalid input(s): POCBNP  CBG: Recent Labs  Lab 07/21/21 1601 07/21/21 1822 07/21/21 2124 07/22/21 0853 07/22/21 1147  GLUCAP 140* 163* 167* 168* 184*     Microbiology: Results for orders placed or performed during the hospital encounter of 07/20/21  Blood culture (routine x 2)     Status: None (Preliminary result)   Collection Time: 07/20/21  7:37 PM   Specimen: BLOOD  Result Value Ref Range Status   Specimen Description BLOOD BLOOD LEFT HAND  Final   Special Requests   Final    BOTTLES DRAWN AEROBIC AND ANAEROBIC Blood Culture adequate volume   Culture   Final    NO GROWTH 2 DAYS Performed at [facility], [address], [place], [place] [address]    Report Status PENDING  Incomplete  Blood culture (routine x 2)     Status: None (Preliminary result)   Collection Time: 07/20/21  7:37 PM   Specimen: BLOOD  Result Value Ref Range Status   Specimen Description BLOOD LEFT ANTECUBITAL  Final   Special Requests   Final    BOTTLES DRAWN AEROBIC AND ANAEROBIC Blood Culture adequate volume   Culture   Final  NO GROWTH 2 DAYS Performed at [facility], [address] [place], [place] [address]    Report Status PENDING  Incomplete  Resp Panel by RT-PCR (Flu A&B, Covid) Nasopharyngeal Swab     Status: None   Collection Time: 07/20/21  7:37 PM   Specimen: Nasopharyngeal Swab; Nasopharyngeal(NP) swabs in vial transport medium  Result Value Ref Range Status   SARS Coronavirus 2 by RT PCR NEGATIVE NEGATIVE Final    Comment: (NOTE) SARS-CoV-2 target nucleic acids are NOT DETECTED.  The SARS-CoV-2 RNA is generally detectable in upper respiratory specimens during the acute phase of infection. The lowest concentration of SARS-CoV-2 viral copies this assay can detect is 138 copies/mL. A negative result does not preclude SARS-Cov-2 infection and should not be used as the sole basis  for treatment or other patient management decisions. A negative result may occur with  improper specimen collection/handling, submission of specimen other than nasopharyngeal swab, presence of viral mutation(s) within the areas targeted by this assay, and inadequate number of viral copies(<138 copies/mL). A negative result must be combined with clinical observations, patient history, and epidemiological information. The expected result is Negative.  Fact Sheet for Patients:  EntrepreneurPulse.com.au  Fact Sheet for Healthcare Providers:  IncredibleEmployment.be  This test is no t yet approved or cleared by the [place] FDA and  has been authorized for detection and/or diagnosis of SARS-CoV-2 by FDA under an Emergency Use Authorization (EUA). This EUA will remain  in effect (meaning this test can be used) for the duration of the COVID-19 declaration under Section 564(b)(1) of the Act, 21 U.S.C.section 360bbb-3(b)(1), unless the authorization is terminated  or revoked sooner.       Influenza A by PCR NEGATIVE NEGATIVE Final   Influenza B by PCR NEGATIVE NEGATIVE Final    Comment: (NOTE) The Xpert Xpress SARS-CoV-2/FLU/RSV plus assay is intended as an aid in the diagnosis of influenza from Nasopharyngeal swab specimens and should not be used as a sole basis for treatment. Nasal washings and aspirates are unacceptable for Xpert Xpress SARS-CoV-2/FLU/RSV testing.  Fact Sheet for Patients: EntrepreneurPulse.com.au  Fact Sheet for Healthcare Providers: IncredibleEmployment.be  This test is not yet approved or cleared by the [place] FDA and has been authorized for detection and/or diagnosis of SARS-CoV-2 by FDA under an Emergency Use Authorization (EUA). This EUA will remain in effect (meaning this test can be used) for the duration of the COVID-19 declaration under Section 564(b)(1) of the Act, 21  U.S.C. section 360bbb-3(b)(1), unless the authorization is terminated or revoked.  Performed at [facility], [address] [place], [place] [address]   MRSA Next Gen by PCR, Nasal     Status: Abnormal   Collection Time: 07/20/21  8:09 PM   Specimen: Nasopharyngeal Swab; Nasal Swab  Result Value Ref Range Status   MRSA by PCR Next Gen DETECTED (A) NOT DETECTED Final    Comment: RESULT CALLED TO, READ BACK BY AND VERIFIED WITH: KATIE ALLRED @2200  ON 07/20/21 SKL (NOTE) The GeneXpert MRSA Assay (FDA approved for NASAL specimens only), is one component of a comprehensive MRSA colonization surveillance program. It is not intended to diagnose MRSA infection nor to guide or monitor treatment for MRSA infections. Test performance is not FDA approved in patients less than 25 years old. Performed at [facility], [address] [place], [place] [address]     Coagulation Studies: No results for input(s): LABPROT, INR in the last 72 hours.  Urinalysis: Recent Labs  07/20/21 1739  COLORURINE YELLOW*  LABSPEC 1.016  PHURINE 7.0  GLUCOSEU 150*  HGBUR SMALL*  BILIRUBINUR NEGATIVE  KETONESUR NEGATIVE  PROTEINUR 100*  NITRITE NEGATIVE  LEUKOCYTESUR LARGE*       Imaging: No results found.   Medications:    ceFEPime (MAXIPIME) IV      amLODipine  2.5 mg Oral Daily   atorvastatin  10 mg Oral Daily   [START ON 07/23/2021] Chlorhexidine Gluconate Cloth  6 each Topical Q0600   cloNIDine  0.1 mg Oral Daily   heparin  5,000 Units Subcutaneous Q8H   insulin aspart  0-5 Units Subcutaneous QHS   insulin aspart  0-6 Units Subcutaneous TID WC   insulin glargine-yfgn  20 Units Subcutaneous QHS   irbesartan  150 mg Oral Daily   isosorbide mononitrate  30 mg Oral Daily   magnesium oxide  400 mg Oral Daily   multivitamin  1 tablet Oral Daily   mupirocin ointment  1 application Nasal BID   polyethylene glycol  17 g Oral Daily   senna  2 tablet Oral Daily    vancomycin variable dose per unstable renal function (pharmacist dosing)   Does not apply See admin instructions   acetaminophen **OR** acetaminophen, HYDROcodone-acetaminophen, ondansetron **OR** ondansetron (ZOFRAN) IV  Assessment/ Plan:  [name] is a 80 y.o. white female with end stage renal disease on peritoneal dialysis, hypertension, diabetes mellitus type II insulin dependent, anemia, hyperlipidemia, osteoporosis who is admitted to [facility] on 07/20/2021 for Cellulitis of abdominal wall [L03.311] Cellulitis [L03.90] PD catheter dysfunction, initial encounter ([place]) [T85.611A]  [facility] 59kg CCPD 5 exchanges 2 liter fills 9 hours.   End stage renal disease on peritoneal dialysis with complication of dialysis device. Patient now with a new PD catheter.  - Appreciate General Surgery input.  - Plan on low fill volumes tonight 1257mL.  - Plan on discharge with 1248mL fill volumes. Patient's family to pick up new prescription for her lower fill volumes.   Hypertension with chronic kidney disease: 160/70. Home regimen of amlodipine, telmisartan, clonidine, isosorbide mononitrate, spironolactone.  - telmisartan changed to irbesartan for inpatient.  - holding spironolactone - potassium at 5.   Diabetes mellitus type II with chronic kidney disease: insulin dependent. Hemoglobin A1c of 6.1% on 06/09/21. - continue glucose control  Anemia with chronic kidney disease: hemoglobin 7.5. Will need her ESA while admitted.   Secondary Hyperparathyroidism: outpatient labs from 11/3 show phosphorus of 4.3, calcium of 7.3 - corrected to 8.2, and pTH 489. Currently not any binders.   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 Cell count from outpatient on 11/16 not consistent with peritonitis. Cultures are with no growth.  - Anticipate PO antibiotics on discharge.     LOS: 0 Landers Prajapati 11/18/20221:11 PM

## 2021-07-23 DIAGNOSIS — N186 End stage renal disease: Secondary | ICD-10-CM | POA: Diagnosis not present

## 2021-07-23 DIAGNOSIS — I10 Essential (primary) hypertension: Secondary | ICD-10-CM | POA: Diagnosis not present

## 2021-07-23 DIAGNOSIS — T85611A Breakdown (mechanical) of intraperitoneal dialysis catheter, initial encounter: Secondary | ICD-10-CM | POA: Diagnosis not present

## 2021-07-23 DIAGNOSIS — L03311 Cellulitis of abdominal wall: Secondary | ICD-10-CM | POA: Diagnosis not present

## 2021-07-23 LAB — GLUCOSE, CAPILLARY
Glucose-Capillary: 112 mg/dL — ABNORMAL HIGH (ref 70–99)
Glucose-Capillary: 98 mg/dL (ref 70–99)

## 2021-07-23 MED ORDER — MUPIROCIN 2 % EX OINT: 1.0000 "application " | TOPICAL_OINTMENT | Freq: Two times a day (BID) | CUTANEOUS | 0 refills | Status: AC

## 2021-07-23 MED ORDER — CLINDAMYCIN HCL 300 MG PO CAPS: 300.0000 mg | ORAL_CAPSULE | Freq: Four times a day (QID) | ORAL | 0 refills | Status: AC

## 2021-07-23 NOTE — Discharge Summary (Addendum)
Discharge Summary  Catherine Burke KGM:[id number] DOB: 1941-07-29  PCP: Idelle Crouch, MD  Admit date: 07/20/2021 Discharge date: 07/23/2021  Time spent: 25 minutes  Recommendations for Outpatient Follow-up:  New medication: Clindamycin 300 mg p.o. 4 times a day for the next 3 days New medication: Mupirocin applied to the nares twice a day for the next 4 days Patient will discharge on 1200 mL fill volumes for peritoneal dialysis from here on out.  Discharge Diagnoses:  Active Hospital Problems   Diagnosis Date Noted   Abdominal wall cellulitis 07/20/2021   Cellulitis 07/20/2021   B12 deficiency 04/04/2021   ESRD (end stage renal disease) ([place]) 07/21/2020   Acute on chronic anemia 07/21/2020   Peritoneal dialysis status ([place]) 07/21/2020   Proteinuria 10/10/2017   GERD (gastroesophageal reflux disease)    Hyperlipidemia 01/17/2007   Essential hypertension 01/17/2007    Resolved Hospital Problems  No resolved problems to display.    Discharge Condition: Improved, being discharged home  Diet recommendation: Renal diet  Vitals:   07/23/21 0559 07/23/21 0757  BP: (!) 154/70 (!) 153/64  Pulse: 62 68  Resp: 17 12  Temp: 98.1 F (36.7 C) 98.2 F (36.8 C)  SpO2: 99% 98%    History of present illness:  80 year old female with history of end-stage renal disease on peritoneal dialysis (placed in June 2019) as well as hypertension who presented to the emergency room on 11/16 after she reports her catheter accidentally got wrapped around her rocking chair and she felt a pop and it was slightly pulled out although still functional.  This occurred the day prior, but she came in on 11/16 when she noticed some mild abdominal pain as well as erythema and found to have cellulitis.  Patient was admitted to the hospitalist service and started on antibiotics.   Hospital Course:    Abdominal wall cellulitis: Patient does not meet sepsis criteria and sepsis has been ruled out. Has  received 2 days of IV antibiotics and did well.  She has multiple drug allergies to penicillin and fluoroquinolones, so will discharge on 3 more days of p.o. clindamycin. Active Problems:    Hyperlipidemia: Continue statin.     Essential hypertension: Continue home medications.     GERD (gastroesophageal reflux disease)     Acute on chronic anemia: Secondary to renal disease.  Stable.     ESRD (end stage renal disease) ([place]) on peritoneal dialysis.  Last dialysis was Tuesday, 11/15.  Peritoneal dialysis catheter replaced on 11/17.  Nephrology consulted.  Tried initial peritoneal dialysis on 11/18.  Plan for lower fill volumes at 1200 mL from here on out.  Consultants: General surgery Nephrology   Procedures: Replacement of peritoneal dialysis catheter 11/17 Peritoneal dialysis 11/18  Discharge Exam: BP (!) 153/64 (BP Location: Right Arm)   Pulse 68   Temp 98.2 F (36.8 C) (Oral)   Resp 12   Ht 5\' 4"  (1.626 m)   Wt 54.9 kg   SpO2 98%   BMI 20.77 kg/m   General: Alert and oriented x3, no acute distress Cardiovascular: Regular rate and rhythm, S1-S2 Respiratory: Clear to auscultation bilaterally  Discharge Instructions You were cared for by a hospitalist during your hospital stay. If you have any questions about your discharge medications or the care you received while you were in the hospital after you are discharged, you can call the unit and asked to speak with the hospitalist on call if the hospitalist that took care of you is not available.  Once you are discharged, your primary care physician will handle any further medical issues. Please note that NO REFILLS for any discharge medications will be authorized once you are discharged, as it is imperative that you return to your primary care physician (or establish a relationship with a primary care physician if you do not have one) for your aftercare needs so that they can reassess your need for medications and monitor your lab  values.   Allergies as of 07/23/2021       Reactions   Levofloxacin Other (See Comments)   Throat Swelling    Penicillins Anaphylaxis   Other    Penicillin G Other (See Comments)   Sulfa Antibiotics         Medication List     TAKE these medications    Accu-Chek Aviva Plus test strip Generic drug: glucose blood TESTING TWICE DAILY.   amLODipine 2.5 MG tablet Commonly known as: NORVASC Take 2.5 mg by mouth daily.   APAP-MAG SALICYLATE-CAFFEINE PO once   atorvastatin 10 MG tablet Commonly known as: LIPITOR Take 10 mg by mouth daily.   cetirizine 10 MG tablet Commonly known as: ZYRTEC Take by mouth.   clindamycin 300 MG capsule Commonly known as: CLEOCIN Take 1 capsule (300 mg total) by mouth 4 (four) times daily for 3 days.   cloNIDine 0.1 MG tablet Commonly known as: CATAPRES Take by mouth.   Dialyvite 5000 5 MG Tabs Take 1 tablet by mouth daily.   hydrALAZINE 100 MG tablet Commonly known as: APRESOLINE Take 1 tablet (100 mg total) by mouth every 8 (eight) hours.   Insulin Pen Needle 31G X 5 MM Misc As directed   isosorbide mononitrate 30 MG 24 hr tablet Commonly known as: IMDUR Take 30 mg by mouth daily.   Lantus SoloStar 100 UNIT/ML Solostar Pen Generic drug: insulin glargine Inject 20 Units into the skin at bedtime.   MAGnesium-Oxide 400 (240 Mg) MG tablet Generic drug: magnesium oxide Take 1 tablet by mouth daily.   mupirocin ointment 2 % Commonly known as: BACTROBAN Place 1 application into the nose 2 (two) times daily for 4 days.   omeprazole 40 MG capsule Commonly known as: PRILOSEC Take 40 mg by mouth daily.   potassium chloride 10 MEQ tablet Commonly known as: KLOR-CON Take 10 mEq by mouth daily.   senna 8.6 MG tablet Commonly known as: SENOKOT Take 2 tablets by mouth daily.   spironolactone 25 MG tablet Commonly known as: ALDACTONE Take 1 tablet (25 mg total) by mouth daily.   telmisartan 40 MG tablet Commonly known  as: MICARDIS Take by mouth.   torsemide 100 MG tablet Commonly known as: DEMADEX Take 100 mg by mouth daily as needed.   Tylenol 325 MG Caps Generic drug: Acetaminophen Take 1 capsule by mouth daily as needed (pain).   Vitamin D (Ergocalciferol) 1.25 MG (50000 UNIT) Caps capsule Commonly known as: DRISDOL Take 50,000 Units by mouth once a week.       Allergies  Allergen Reactions   Levofloxacin Other (See Comments)    Throat Swelling    Penicillins Anaphylaxis   Other    Penicillin G Other (See Comments)   Sulfa Antibiotics     Follow-up Information     Ronny Bacon, MD. Schedule an appointment as soon as possible for a visit in 2 week(s).   Specialty: General Surgery Why: 2 weeks, s/p peritoneal dialysis catheter removal and replacement Contact information: [address] [place]  [address]  [phone]                  The results of significant diagnostics from this hospitalization (including imaging, microbiology, ancillary and laboratory) are listed below for reference.    Significant Diagnostic Studies: No results found.  Microbiology: Recent Results (from the past 240 hour(s))  Blood culture (routine x 2)     Status: None (Preliminary result)   Collection Time: 07/20/21  7:37 PM   Specimen: BLOOD  Result Value Ref Range Status   Specimen Description BLOOD BLOOD LEFT HAND  Final   Special Requests   Final    BOTTLES DRAWN AEROBIC AND ANAEROBIC Blood Culture adequate volume   Culture   Final    NO GROWTH 3 DAYS Performed at [facility], [address], [place], [place] [address]    Report Status PENDING  Incomplete  Blood culture (routine x 2)     Status: None (Preliminary result)   Collection Time: 07/20/21  7:37 PM   Specimen: BLOOD  Result Value Ref Range Status   Specimen Description BLOOD LEFT ANTECUBITAL  Final   Special Requests   Final    BOTTLES DRAWN AEROBIC AND ANAEROBIC Blood Culture adequate  volume   Culture   Final    NO GROWTH 3 DAYS Performed at [facility], [address] [place], [place] [address]    Report Status PENDING  Incomplete  Resp Panel by RT-PCR (Flu A&B, Covid) Nasopharyngeal Swab     Status: None   Collection Time: 07/20/21  7:37 PM   Specimen: Nasopharyngeal Swab; Nasopharyngeal(NP) swabs in vial transport medium  Result Value Ref Range Status   SARS Coronavirus 2 by RT PCR NEGATIVE NEGATIVE Final    Comment: (NOTE) SARS-CoV-2 target nucleic acids are NOT DETECTED.  The SARS-CoV-2 RNA is generally detectable in upper respiratory specimens during the acute phase of infection. The lowest concentration of SARS-CoV-2 viral copies this assay can detect is 138 copies/mL. A negative result does not preclude SARS-Cov-2 infection and should not be used as the sole basis for treatment or other patient management decisions. A negative result may occur with  improper specimen collection/handling, submission of specimen other than nasopharyngeal swab, presence of viral mutation(s) within the areas targeted by this assay, and inadequate number of viral copies(<138 copies/mL). A negative result must be combined with clinical observations, patient history, and epidemiological information. The expected result is Negative.  Fact Sheet for Patients:  EntrepreneurPulse.com.au  Fact Sheet for Healthcare Providers:  IncredibleEmployment.be  This test is no t yet approved or cleared by the [place] FDA and  has been authorized for detection and/or diagnosis of SARS-CoV-2 by FDA under an Emergency Use Authorization (EUA). This EUA will remain  in effect (meaning this test can be used) for the duration of the COVID-19 declaration under Section 564(b)(1) of the Act, 21 U.S.C.section 360bbb-3(b)(1), unless the authorization is terminated  or revoked sooner.       Influenza A by PCR NEGATIVE NEGATIVE Final   Influenza  B by PCR NEGATIVE NEGATIVE Final    Comment: (NOTE) The Xpert Xpress SARS-CoV-2/FLU/RSV plus assay is intended as an aid in the diagnosis of influenza from Nasopharyngeal swab specimens and should not be used as a sole basis for treatment. Nasal washings and aspirates are unacceptable for Xpert Xpress SARS-CoV-2/FLU/RSV testing.  Fact Sheet for Patients: EntrepreneurPulse.com.au  Fact Sheet for Healthcare Providers: IncredibleEmployment.be  This test is not yet approved or cleared by the [place] FDA  and has been authorized for detection and/or diagnosis of SARS-CoV-2 by FDA under an Emergency Use Authorization (EUA). This EUA will remain in effect (meaning this test can be used) for the duration of the COVID-19 declaration under Section 564(b)(1) of the Act, 21 U.S.C. section 360bbb-3(b)(1), unless the authorization is terminated or revoked.  Performed at [facility], [address] [place], [place] [address]   MRSA Next Gen by PCR, Nasal     Status: Abnormal   Collection Time: 07/20/21  8:09 PM   Specimen: Nasopharyngeal Swab; Nasal Swab  Result Value Ref Range Status   MRSA by PCR Next Gen DETECTED (A) NOT DETECTED Final    Comment: RESULT CALLED TO, READ BACK BY AND VERIFIED WITH: KATIE ALLRED @2200  ON 07/20/21 SKL (NOTE) The GeneXpert MRSA Assay (FDA approved for NASAL specimens only), is one component of a comprehensive MRSA colonization surveillance program. It is not intended to diagnose MRSA infection nor to guide or monitor treatment for MRSA infections. Test performance is not FDA approved in patients less than 10 years old. Performed at [facility], [address] [place], [place] [address]      Labs: Basic Metabolic Panel: Recent Labs  Lab 07/20/21 1519 07/21/21 0635 07/22/21 0506  NA 136 137 136  K 3.7 3.7 5.0  CL 99 99 102  CO2 26 24 19*  GLUCOSE 304* 133* 194*  BUN 53* 60* 72*   CREATININE 7.68* 8.98* 9.03*  CALCIUM 8.0* 7.9* 7.9*   Liver Function Tests: Recent Labs  Lab 07/20/21 1519 07/21/21 0635  AST 24 16  ALT 13 11  ALKPHOS 66 52  BILITOT 0.9 0.7  PROT 7.0 6.1*  ALBUMIN 2.4* 2.0*   No results for input(s): LIPASE, AMYLASE in the last 168 hours. No results for input(s): AMMONIA in the last 168 hours. CBC: Recent Labs  Lab 07/20/21 1519 07/21/21 0635 07/22/21 0506  WBC 5.6 3.3* 4.1  NEUTROABS 4.8  --   --   HGB 8.9* 7.7* 7.5*  HCT 28.8* 24.3* 25.5*  MCV 92.9 93.5 96.2  PLT 240 219 260   Cardiac Enzymes: No results for input(s): CKTOTAL, CKMB, CKMBINDEX, TROPONINI in the last 168 hours. BNP: BNP (last 3 results) No results for input(s): BNP in the last 8760 hours.  ProBNP (last 3 results) No results for input(s): PROBNP in the last 8760 hours.  CBG: Recent Labs  Lab 07/22/21 0853 07/22/21 1147 07/22/21 1635 07/22/21 2115 07/23/21 0758  GLUCAP 168* 184* 238* 174* 112*       Signed:  Annita Brod, MD [facility] 07/23/2021, 10:30 AM

## 2021-07-23 NOTE — Progress Notes (Signed)
[facility]  ROUNDING NOTE   Subjective:   Peritoneal dialysis last night. Low fill volumes to 1264mL. Tolerated treatment well.   Bowel movement this morning   Objective:  Vital signs in last 24 hours:  Temp:  [98.1 F (36.7 C)-98.2 F (36.8 C)] 98.2 F (36.8 C) (11/19 0757) Pulse Rate:  [62-72] 68 (11/19 0757) Resp:  [12-20] 12 (11/19 0757) BP: (146-154)/(57-70) 153/64 (11/19 0757) SpO2:  [98 %-99 %] 98 % (11/19 0757)  Weight change:  Filed Weights   07/20/21 1517 07/21/21 1554  Weight: 55.6 kg 54.9 kg    Intake/Output: I/O last 3 completed shifts: In: 480 [P.O.:480] Out: -    Intake/Output this shift:  Total I/O In: 240 [P.O.:240] Out: -   Physical Exam: General: NAD, sitting on side of the bed   Head: Normocephalic, atraumatic. Moist oral mucosal membranes  Eyes: Anicteric, PERRL  Neck: Supple, trachea midline  Lungs:  Clear to auscultation  Heart: Regular rate and rhythm  Abdomen:  Soft, nontender  Extremities:  no peripheral edema.  Neurologic: Nonfocal, moving all four extremities  Skin: No lesions  Access: PD catheter with clean incisions RLQ.     Basic Metabolic Panel: Recent Labs  Lab 07/20/21 1519 07/21/21 0635 07/22/21 0506  NA 136 137 136  K 3.7 3.7 5.0  CL 99 99 102  CO2 26 24 19*  GLUCOSE 304* 133* 194*  BUN 53* 60* 72*  CREATININE 7.68* 8.98* 9.03*  CALCIUM 8.0* 7.9* 7.9*     Liver Function Tests: Recent Labs  Lab 07/20/21 1519 07/21/21 0635  AST 24 16  ALT 13 11  ALKPHOS 66 52  BILITOT 0.9 0.7  PROT 7.0 6.1*  ALBUMIN 2.4* 2.0*    No results for input(s): LIPASE, AMYLASE in the last 168 hours. No results for input(s): AMMONIA in the last 168 hours.  CBC: Recent Labs  Lab 07/20/21 1519 07/21/21 0635 07/22/21 0506  WBC 5.6 3.3* 4.1  NEUTROABS 4.8  --   --   HGB 8.9* 7.7* 7.5*  HCT 28.8* 24.3* 25.5*  MCV 92.9 93.5 96.2  PLT 240 219 260     Cardiac Enzymes: No results for input(s): CKTOTAL,  CKMB, CKMBINDEX, TROPONINI in the last 168 hours.  BNP: Invalid input(s): POCBNP  CBG: Recent Labs  Lab 07/22/21 0853 07/22/21 1147 07/22/21 1635 07/22/21 2115 07/23/21 0758  GLUCAP 168* 184* 238* 174* 112*     Microbiology: Results for orders placed or performed during the hospital encounter of 07/20/21  Blood culture (routine x 2)     Status: None (Preliminary result)   Collection Time: 07/20/21  7:37 PM   Specimen: BLOOD  Result Value Ref Range Status   Specimen Description BLOOD BLOOD LEFT HAND  Final   Special Requests   Final    BOTTLES DRAWN AEROBIC AND ANAEROBIC Blood Culture adequate volume   Culture   Final    NO GROWTH 3 DAYS Performed at [facility], [address] [place], [place] [address]    Report Status PENDING  Incomplete  Blood culture (routine x 2)     Status: None (Preliminary result)   Collection Time: 07/20/21  7:37 PM   Specimen: BLOOD  Result Value Ref Range Status   Specimen Description BLOOD LEFT ANTECUBITAL  Final   Special Requests   Final    BOTTLES DRAWN AEROBIC AND ANAEROBIC Blood Culture adequate volume   Culture   Final    NO GROWTH 3 DAYS Performed at  [facility], [address] [place], [place] [address]    Report Status PENDING  Incomplete  Resp Panel by RT-PCR (Flu A&B, Covid) Nasopharyngeal Swab     Status: None   Collection Time: 07/20/21  7:37 PM   Specimen: Nasopharyngeal Swab; Nasopharyngeal(NP) swabs in vial transport medium  Result Value Ref Range Status   SARS Coronavirus 2 by RT PCR NEGATIVE NEGATIVE Final    Comment: (NOTE) SARS-CoV-2 target nucleic acids are NOT DETECTED.  The SARS-CoV-2 RNA is generally detectable in upper respiratory specimens during the acute phase of infection. The lowest concentration of SARS-CoV-2 viral copies this assay can detect is 138 copies/mL. A negative result does not preclude SARS-Cov-2 infection and should not be used as the sole basis for treatment  or other patient management decisions. A negative result may occur with  improper specimen collection/handling, submission of specimen other than nasopharyngeal swab, presence of viral mutation(s) within the areas targeted by this assay, and inadequate number of viral copies(<138 copies/mL). A negative result must be combined with clinical observations, patient history, and epidemiological information. The expected result is Negative.  Fact Sheet for Patients:  EntrepreneurPulse.com.au  Fact Sheet for Healthcare Providers:  IncredibleEmployment.be  This test is no t yet approved or cleared by the [place] FDA and  has been authorized for detection and/or diagnosis of SARS-CoV-2 by FDA under an Emergency Use Authorization (EUA). This EUA will remain  in effect (meaning this test can be used) for the duration of the COVID-19 declaration under Section 564(b)(1) of the Act, 21 U.S.C.section 360bbb-3(b)(1), unless the authorization is terminated  or revoked sooner.       Influenza A by PCR NEGATIVE NEGATIVE Final   Influenza B by PCR NEGATIVE NEGATIVE Final    Comment: (NOTE) The Xpert Xpress SARS-CoV-2/FLU/RSV plus assay is intended as an aid in the diagnosis of influenza from Nasopharyngeal swab specimens and should not be used as a sole basis for treatment. Nasal washings and aspirates are unacceptable for Xpert Xpress SARS-CoV-2/FLU/RSV testing.  Fact Sheet for Patients: EntrepreneurPulse.com.au  Fact Sheet for Healthcare Providers: IncredibleEmployment.be  This test is not yet approved or cleared by the [place] FDA and has been authorized for detection and/or diagnosis of SARS-CoV-2 by FDA under an Emergency Use Authorization (EUA). This EUA will remain in effect (meaning this test can be used) for the duration of the COVID-19 declaration under Section 564(b)(1) of the Act, 21 U.S.C. section  360bbb-3(b)(1), unless the authorization is terminated or revoked.  Performed at [facility], [address] [place], [place] [address]   MRSA Next Gen by PCR, Nasal     Status: Abnormal   Collection Time: 07/20/21  8:09 PM   Specimen: Nasopharyngeal Swab; Nasal Swab  Result Value Ref Range Status   MRSA by PCR Next Gen DETECTED (A) NOT DETECTED Final    Comment: RESULT CALLED TO, READ BACK BY AND VERIFIED WITH: KATIE ALLRED @2200  ON 07/20/21 SKL (NOTE) The GeneXpert MRSA Assay (FDA approved for NASAL specimens only), is one component of a comprehensive MRSA colonization surveillance program. It is not intended to diagnose MRSA infection nor to guide or monitor treatment for MRSA infections. Test performance is not FDA approved in patients less than 75 years old. Performed at [facility], [address] [place], [place] [address]     Coagulation Studies: No results for input(s): LABPROT, INR in the last 72 hours.  Urinalysis: Recent Labs    07/20/21 [place]  YELLOW*  LABSPEC 1.016  PHURINE 7.0  GLUCOSEU 150*  HGBUR SMALL*  BILIRUBINUR NEGATIVE  KETONESUR NEGATIVE  PROTEINUR 100*  NITRITE NEGATIVE  LEUKOCYTESUR LARGE*       Imaging: No results found.   Medications:    ceFEPime (MAXIPIME) IV 1 g (07/22/21 1807)   dialysis solution 1.5% low-MG/low-CA      amLODipine  2.5 mg Oral Daily   atorvastatin  10 mg Oral Daily   Chlorhexidine Gluconate Cloth  6 each Topical Q0600   cloNIDine  0.1 mg Oral Daily   gentamicin cream  1 application Topical Daily   heparin  5,000 Units Subcutaneous Q8H   insulin aspart  0-5 Units Subcutaneous QHS   insulin aspart  0-6 Units Subcutaneous TID WC   insulin glargine-yfgn  20 Units Subcutaneous QHS   irbesartan  150 mg Oral Daily   isosorbide mononitrate  30 mg Oral Daily   magnesium oxide  400 mg Oral Daily   multivitamin  1 tablet Oral Daily   mupirocin ointment  1 application Nasal BID    pantoprazole  40 mg Oral Daily   polyethylene glycol  17 g Oral Daily   senna  2 tablet Oral Daily   vancomycin variable dose per unstable renal function (pharmacist dosing)   Does not apply See admin instructions   acetaminophen **OR** acetaminophen, alum & mag hydroxide-simeth, HYDROcodone-acetaminophen, ondansetron **OR** ondansetron (ZOFRAN) IV  Assessment/ Plan:  Ms. ACHOL AZPEITIA is a 80 y.o. white female with end stage renal disease on peritoneal dialysis, hypertension, diabetes mellitus type II insulin dependent, anemia, hyperlipidemia, osteoporosis who is admitted to [facility] on 07/20/2021 for Cellulitis of abdominal wall [L03.311] Cellulitis [L03.90] PD catheter dysfunction, initial encounter ([place]) [T85.611A]  [facility] 59kg CCPD 5 exchanges 2 liter fills 9 hours.   End stage renal disease on peritoneal dialysis with complication of dialysis device. Patient now with a new PD catheter.  - Appreciate General Surgery input.  - Plan on low fill volumes 1218mL fro next several weeks.  - Patient's family to pick up new prescription for her lower fill volumes. Patient expresses understanding and will have her grandson pick up her new prescription.   Hypertension with chronic kidney disease: 153/64 Home regimen of amlodipine, telmisartan, clonidine, isosorbide mononitrate, spironolactone.  - telmisartan changed to irbesartan for inpatient.   Diabetes mellitus type II with chronic kidney disease: insulin dependent. Hemoglobin A1c of 6.1% on 06/09/21. - continue glucose control  Anemia with chronic kidney disease: hemoglobin 7.5. Will need her ESA while admitted.   Secondary Hyperparathyroidism: Currently not any binders.   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 Cell count from outpatient on 11/16 not consistent with peritonitis. Cultures are with no growth.  - Clindamycin 300mg  for next three days.    LOS: 0 [place] 11/19/202211:42 AM

## 2021-07-23 NOTE — Plan of Care (Signed)
Discharge teaching completed with patient who is in stable condition. 

## 2021-07-23 NOTE — Plan of Care (Signed)
Continuing with plan of care. 

## 2021-07-25 LAB — CULTURE, BLOOD (ROUTINE X 2)
Culture: NO GROWTH
Culture: NO GROWTH
Special Requests: ADEQUATE
Special Requests: ADEQUATE

## 2021-08-01 ENCOUNTER — Inpatient Hospital Stay (HOSPITAL_COMMUNITY): Payer: Medicare PPO

## 2021-08-02 NOTE — Anesthesia Postprocedure Evaluation (Signed)
Anesthesia Post Note  Patient: Catherine Burke  Procedure(s) Performed: REMOVAL CONTINUOUS AMBULATORY PERITONEAL DIALYSIS  (CAPD) CATHETER LAPAROSCOPIC INSERTION CONTINUOUS AMBULATORY PERITONEAL DIALYSIS  (CAPD) CATHETER  Patient location during evaluation: PACU Anesthesia Type: General Level of consciousness: awake and alert Pain management: pain level controlled Vital Signs Assessment: post-procedure vital signs reviewed and stable Respiratory status: spontaneous breathing, nonlabored ventilation, respiratory function stable and patient connected to nasal cannula oxygen Cardiovascular status: blood pressure returned to baseline and stable Postop Assessment: no apparent nausea or vomiting Anesthetic complications: no   No notable events documented.   Last Vitals:  Vitals:   07/23/21 0559 07/23/21 0757  BP: (!) 154/70 (!) 153/64  Pulse: 62 68  Resp: 17 12  Temp: 36.7 C 36.8 C  SpO2: 99% 98%    Last Pain:  Vitals:   07/23/21 0900  TempSrc:   PainSc: 0-No pain                 Molli Barrows

## 2021-08-11 ENCOUNTER — Ambulatory Visit: Payer: Medicare PPO | Admitting: Orthopaedic Surgery

## 2021-08-16 ENCOUNTER — Inpatient Hospital Stay (HOSPITAL_COMMUNITY): Payer: Medicare PPO | Attending: Hematology

## 2021-08-16 ENCOUNTER — Encounter (HOSPITAL_COMMUNITY): Payer: Self-pay

## 2021-08-16 VITALS — BP 138/73 | HR 80 | Temp 98.4°F | Resp 18

## 2021-08-16 DIAGNOSIS — E538 Deficiency of other specified B group vitamins: Secondary | ICD-10-CM | POA: Diagnosis not present

## 2021-08-16 MED ORDER — CYANOCOBALAMIN 1000 MCG/ML IJ SOLN
1000.0000 ug | Freq: Once | INTRAMUSCULAR | Status: AC
  Administered 2021-08-16: 1000 ug via INTRAMUSCULAR
  Filled 2021-08-16: qty 1

## 2021-08-16 NOTE — Progress Notes (Signed)
Patient tolerated injection with no complaints voiced.  Site clean and dry with no bruising or swelling noted at site.  See MAR for details.  Band aid applied.  Patient stable during and after injection.  Vss with discharge and left in satisfactory condition with no s/s of distress noted.  

## 2021-08-16 NOTE — Patient Instructions (Signed)
[facility]  Discharge Instructions: Thank you for choosing [facility] to provide your oncology and hematology care.  If you have a lab appointment with the Cancer Center, please come in thru the Main Entrance and check in at the main information desk.  Wear comfortable clothing and clothing appropriate for easy access to any Portacath or PICC line.   We strive to give you quality time with your provider. You may need to reschedule your appointment if you arrive late (15 or more minutes).  Arriving late affects you and other patients whose appointments are after yours.  Also, if you miss three or more appointments without notifying the office, you may be dismissed from the clinic at the provider's discretion.      For prescription refill requests, have your pharmacy contact our office and allow 72 hours for refills to be completed.        To help prevent nausea and vomiting after your treatment, we encourage you to take your nausea medication as directed.  BELOW ARE SYMPTOMS THAT SHOULD BE REPORTED IMMEDIATELY: *FEVER GREATER THAN 100.4 F (38 C) OR HIGHER *CHILLS OR SWEATING *NAUSEA AND VOMITING THAT IS NOT CONTROLLED WITH YOUR NAUSEA MEDICATION *UNUSUAL SHORTNESS OF BREATH *UNUSUAL BRUISING OR BLEEDING *URINARY PROBLEMS (pain or burning when urinating, or frequent urination) *BOWEL PROBLEMS (unusual diarrhea, constipation, pain near the anus) TENDERNESS IN MOUTH AND THROAT WITH OR WITHOUT PRESENCE OF ULCERS (sore throat, sores in mouth, or a toothache) UNUSUAL RASH, SWELLING OR PAIN  UNUSUAL VAGINAL DISCHARGE OR ITCHING   Items with * indicate a potential emergency and should be followed up as soon as possible or go to the Emergency Department if any problems should occur.  Please show the CHEMOTHERAPY ALERT CARD or IMMUNOTHERAPY ALERT CARD at check-in to the Emergency Department and triage nurse.  Should you have questions after your visit or need to cancel  or reschedule your appointment, please contact [facility] [phone]  and follow the prompts.  Office hours are 8:00 a.m. to 4:30 p.m. Monday - Friday. Please note that voicemails left after 4:00 p.m. may not be returned until the following business day.  We are closed weekends and major holidays. You have access to a nurse at all times for urgent questions. Please call the main number to the clinic [phone] and follow the prompts.  For any non-urgent questions, you may also contact your provider using MyChart. We now offer e-Visits for anyone 18 and older to request care online for non-urgent symptoms. For details visit [email or web address].   Also download the MyChart app! Go to the app store, search "MyChart", open the app, select , and log in with your MyChart username and password.  Due to Covid, a mask is required upon entering the hospital/clinic. If you do not have a mask, one will be given to you upon arrival. For doctor visits, patients may have 1 support person aged 18 or older with them. For treatment visits, patients cannot have anyone with them due to current Covid guidelines and our immunocompromised population.  

## 2021-09-06 ENCOUNTER — Ambulatory Visit (HOSPITAL_COMMUNITY): Payer: Medicare PPO

## 2021-09-19 ENCOUNTER — Other Ambulatory Visit: Payer: Self-pay

## 2021-09-19 ENCOUNTER — Inpatient Hospital Stay (HOSPITAL_COMMUNITY): Payer: Medicare PPO | Attending: Hematology

## 2021-09-19 VITALS — BP 127/68 | HR 88 | Temp 98.7°F | Resp 18

## 2021-09-19 DIAGNOSIS — E538 Deficiency of other specified B group vitamins: Secondary | ICD-10-CM | POA: Insufficient documentation

## 2021-09-19 MED ORDER — CYANOCOBALAMIN 1000 MCG/ML IJ SOLN
1000.0000 ug | Freq: Once | INTRAMUSCULAR | Status: AC
  Administered 2021-09-19: 1000 ug via INTRAMUSCULAR
  Filled 2021-09-19: qty 1

## 2021-09-19 NOTE — Progress Notes (Signed)
Injection given per orders. Patient tolerated it well without problems. Vitals stable and discharged home from clinic via wheelchair.Follow up as scheduled.

## 2021-09-19 NOTE — Patient Instructions (Signed)
[facility]  Discharge Instructions: ?Thank you for choosing [facility] to provide your oncology and hematology care.  ?If you have a lab appointment with the Cancer Center, please come in thru the Main Entrance and check in at the main information desk. ? ? ? ?We strive to give you quality time with your provider. You may need to reschedule your appointment if you arrive late (15 or more minutes).  Arriving late affects you and other patients whose appointments are after yours.  Also, if you miss three or more appointments without notifying the office, you may be dismissed from the clinic at the provider?s discretion.    ?  ?For prescription refill requests, have your pharmacy contact our office and allow 72 hours for refills to be completed.   ? ?  ?To help prevent nausea and vomiting after your treatment, we encourage you to take your nausea medication as directed. ? ?BELOW ARE SYMPTOMS THAT SHOULD BE REPORTED IMMEDIATELY: ?*FEVER GREATER THAN 100.4 F (38 ?C) OR HIGHER ?*CHILLS OR SWEATING ?*NAUSEA AND VOMITING THAT IS NOT CONTROLLED WITH YOUR NAUSEA MEDICATION ?*UNUSUAL SHORTNESS OF BREATH ?*UNUSUAL BRUISING OR BLEEDING ?*URINARY PROBLEMS (pain or burning when urinating, or frequent urination) ?*BOWEL PROBLEMS (unusual diarrhea, constipation, pain near the anus) ?TENDERNESS IN MOUTH AND THROAT WITH OR WITHOUT PRESENCE OF ULCERS (sore throat, sores in mouth, or a toothache) ?UNUSUAL RASH, SWELLING OR PAIN  ?UNUSUAL VAGINAL DISCHARGE OR ITCHING  ? ?Items with * indicate a potential emergency and should be followed up as soon as possible or go to the Emergency Department if any problems should occur. ? ?Please show the CHEMOTHERAPY ALERT CARD or IMMUNOTHERAPY ALERT CARD at check-in to the Emergency Department and triage nurse. ? ?Should you have questions after your visit or need to cancel or reschedule your appointment, please contact [facility] [phone]  and follow  the prompts.  Office hours are 8:00 a.m. to 4:30 p.m. Monday - Friday. Please note that voicemails left after 4:00 p.m. may not be returned until the following business day.  We are closed weekends and major holidays. You have access to a nurse at all times for urgent questions. Please call the main number to the clinic [phone] and follow the prompts. ? ?For any non-urgent questions, you may also contact your provider using MyChart. We now offer e-Visits for anyone 18 and older to request care online for non-urgent symptoms. For details visit [email or web address]. ?  ?Also download the MyChart app! Go to the app store, search "MyChart", open the app, select [place], and log in with your MyChart username and password. ? ?Due to Covid, a mask is required upon entering the hospital/clinic. If you do not have a mask, one will be given to you upon arrival. For doctor visits, patients may have 1 support person aged 18 or older with them. For treatment visits, patients cannot have anyone with them due to current Covid guidelines and our immunocompromised population.  ?

## 2021-10-03 ENCOUNTER — Other Ambulatory Visit (HOSPITAL_COMMUNITY): Payer: Medicare PPO

## 2021-10-10 ENCOUNTER — Ambulatory Visit (HOSPITAL_COMMUNITY): Payer: Medicare PPO | Admitting: Physician Assistant

## 2021-10-10 ENCOUNTER — Ambulatory Visit (HOSPITAL_COMMUNITY): Payer: Medicare PPO

## 2021-10-13 ENCOUNTER — Observation Stay: Payer: Medicare PPO

## 2021-10-13 ENCOUNTER — Other Ambulatory Visit: Payer: Self-pay

## 2021-10-13 ENCOUNTER — Emergency Department: Payer: Medicare PPO

## 2021-10-13 ENCOUNTER — Inpatient Hospital Stay (HOSPITAL_COMMUNITY): Payer: Medicare PPO | Attending: Physician Assistant

## 2021-10-13 ENCOUNTER — Observation Stay
Admission: EM | Admit: 2021-10-13 | Discharge: 2021-10-14 | Disposition: A | Discharge status: Home / Self Care | Payer: Medicare PPO | Attending: Internal Medicine | Admitting: Internal Medicine

## 2021-10-13 DIAGNOSIS — R109 Unspecified abdominal pain: Secondary | ICD-10-CM | POA: Diagnosis present

## 2021-10-13 DIAGNOSIS — Z79899 Other long term (current) drug therapy: Secondary | ICD-10-CM | POA: Insufficient documentation

## 2021-10-13 DIAGNOSIS — K219 Gastro-esophageal reflux disease without esophagitis: Secondary | ICD-10-CM | POA: Insufficient documentation

## 2021-10-13 DIAGNOSIS — Z992 Dependence on renal dialysis: Secondary | ICD-10-CM | POA: Insufficient documentation

## 2021-10-13 DIAGNOSIS — E11649 Type 2 diabetes mellitus with hypoglycemia without coma: Secondary | ICD-10-CM | POA: Diagnosis not present

## 2021-10-13 DIAGNOSIS — K668 Other specified disorders of peritoneum: Principal | ICD-10-CM

## 2021-10-13 DIAGNOSIS — Z794 Long term (current) use of insulin: Secondary | ICD-10-CM | POA: Insufficient documentation

## 2021-10-13 DIAGNOSIS — I120 Hypertensive chronic kidney disease with stage 5 chronic kidney disease or end stage renal disease: Secondary | ICD-10-CM | POA: Insufficient documentation

## 2021-10-13 DIAGNOSIS — Z7984 Long term (current) use of oral hypoglycemic drugs: Secondary | ICD-10-CM | POA: Insufficient documentation

## 2021-10-13 DIAGNOSIS — N186 End stage renal disease: Secondary | ICD-10-CM | POA: Insufficient documentation

## 2021-10-13 DIAGNOSIS — E1122 Type 2 diabetes mellitus with diabetic chronic kidney disease: Secondary | ICD-10-CM

## 2021-10-13 DIAGNOSIS — Z20822 Contact with and (suspected) exposure to covid-19: Secondary | ICD-10-CM | POA: Diagnosis not present

## 2021-10-13 DIAGNOSIS — R1013 Epigastric pain: Secondary | ICD-10-CM

## 2021-10-13 LAB — BASIC METABOLIC PANEL
Anion gap: 10 (ref 5–15)
BUN: 37 mg/dL — ABNORMAL HIGH (ref 8–23)
CO2: 27 mmol/L (ref 22–32)
Calcium: 8.1 mg/dL — ABNORMAL LOW (ref 8.9–10.3)
Chloride: 98 mmol/L (ref 98–111)
Creatinine, Ser: 5.76 mg/dL — ABNORMAL HIGH (ref 0.44–1.00)
GFR, Estimated: 7 mL/min — ABNORMAL LOW (ref >60)
Glucose, Bld: 211 mg/dL — ABNORMAL HIGH (ref 70–99)
Potassium: 3.5 mmol/L (ref 3.5–5.1)
Sodium: 135 mmol/L (ref 135–145)

## 2021-10-13 LAB — BODY FLUID CELL COUNT WITH DIFFERENTIAL
Eos, Fluid: 1 %
Lymphs, Fluid: 17 %
Monocyte-Macrophage-Serous Fluid: 37 % — ABNORMAL LOW (ref 50–90)
Neutrophil Count, Fluid: 45 % — ABNORMAL HIGH (ref 0–25)
Total Nucleated Cell Count, Fluid: 261 cu mm (ref 0–1000)

## 2021-10-13 LAB — CBC
HCT: 29.6 % — ABNORMAL LOW (ref 36.0–46.0)
Hemoglobin: 9.3 g/dL — ABNORMAL LOW (ref 12.0–15.0)
MCH: 27.9 pg (ref 26.0–34.0)
MCHC: 31.4 g/dL (ref 30.0–36.0)
MCV: 88.9 fL (ref 80.0–100.0)
Platelets: 211 10*3/uL (ref 150–400)
RBC: 3.33 MIL/uL — ABNORMAL LOW (ref 3.87–5.11)
RDW: 17.9 % — ABNORMAL HIGH (ref 11.5–15.5)
WBC: 6.7 10*3/uL (ref 4.0–10.5)
nRBC: 0 % (ref 0.0–0.2)

## 2021-10-13 LAB — D-DIMER, QUANTITATIVE: D-Dimer, Quant: 8.42 ug/mL-FEU — ABNORMAL HIGH (ref 0.00–0.50)

## 2021-10-13 LAB — GLUCOSE, CAPILLARY: Glucose-Capillary: 157 mg/dL — ABNORMAL HIGH (ref 70–99)

## 2021-10-13 LAB — PATHOLOGIST SMEAR REVIEW

## 2021-10-13 LAB — RESP PANEL BY RT-PCR (FLU A&B, COVID) ARPGX2
Influenza A by PCR: NEGATIVE
Influenza B by PCR: NEGATIVE
SARS Coronavirus 2 by RT PCR: NEGATIVE

## 2021-10-13 LAB — TROPONIN I (HIGH SENSITIVITY)
Troponin I (High Sensitivity): 31 ng/L — ABNORMAL HIGH (ref <18)
Troponin I (High Sensitivity): 28 ng/L — ABNORMAL HIGH (ref <18)

## 2021-10-13 LAB — MAGNESIUM: Magnesium: 1.8 mg/dL (ref 1.7–2.4)

## 2021-10-13 MED ORDER — IRBESARTAN 150 MG PO TABS
150.0000 mg | ORAL_TABLET | Freq: Every day | ORAL | Status: DC
  Administered 2021-10-14: 150 mg via ORAL
  Filled 2021-10-13: qty 1

## 2021-10-13 MED ORDER — HYDRALAZINE HCL 50 MG PO TABS
100.0000 mg | ORAL_TABLET | Freq: Three times a day (TID) | ORAL | Status: DC
  Administered 2021-10-13 – 2021-10-14 (×2): 100 mg via ORAL
  Filled 2021-10-13 (×2): qty 2

## 2021-10-13 MED ORDER — MAGNESIUM 400 MG PO CAPS: 1.0000 | ORAL_CAPSULE | Freq: Every day | ORAL | Status: DC

## 2021-10-13 MED ORDER — ACETAMINOPHEN 325 MG PO TABS: 650.0000 mg | ORAL_TABLET | Freq: Four times a day (QID) | ORAL | Status: DC | PRN

## 2021-10-13 MED ORDER — ISOSORBIDE MONONITRATE ER 30 MG PO TB24
30.0000 mg | ORAL_TABLET | Freq: Every day | ORAL | Status: DC
Start: 2021-10-13 — End: 2021-10-14
  Administered 2021-10-13 – 2021-10-14 (×2): 30 mg via ORAL
  Filled 2021-10-13 (×2): qty 1

## 2021-10-13 MED ORDER — INSULIN GLARGINE-YFGN 100 UNIT/ML ~~LOC~~ SOLN
20.0000 [IU] | Freq: Every day | SUBCUTANEOUS | Status: DC
  Administered 2021-10-13: 20 [IU] via SUBCUTANEOUS
  Filled 2021-10-13 (×2): qty 0.2

## 2021-10-13 MED ORDER — IOHEXOL 350 MG/ML SOLN
75.0000 mL | Freq: Once | INTRAVENOUS | Status: AC | PRN
  Administered 2021-10-13: 75 mL via INTRAVENOUS

## 2021-10-13 MED ORDER — PANTOPRAZOLE SODIUM 40 MG PO TBEC
40.0000 mg | DELAYED_RELEASE_TABLET | Freq: Every day | ORAL | Status: DC
  Administered 2021-10-14: 40 mg via ORAL
  Filled 2021-10-13: qty 1

## 2021-10-13 MED ORDER — HEPARIN SODIUM (PORCINE) 5000 UNIT/ML IJ SOLN
5000.0000 [IU] | Freq: Three times a day (TID) | INTRAMUSCULAR | Status: DC
  Administered 2021-10-13 – 2021-10-14 (×2): 5000 [IU] via SUBCUTANEOUS
  Filled 2021-10-13 (×2): qty 1

## 2021-10-13 MED ORDER — ONDANSETRON HCL 4 MG/2ML IJ SOLN: 4.0000 mg | Freq: Four times a day (QID) | INTRAMUSCULAR | Status: DC | PRN

## 2021-10-13 MED ORDER — SPIRONOLACTONE 25 MG PO TABS
25.0000 mg | ORAL_TABLET | Freq: Every day | ORAL | Status: DC
  Administered 2021-10-14: 25 mg via ORAL
  Filled 2021-10-13: qty 1

## 2021-10-13 MED ORDER — ONDANSETRON HCL 4 MG PO TABS: 4.0000 mg | ORAL_TABLET | Freq: Four times a day (QID) | ORAL | Status: DC | PRN

## 2021-10-13 MED ORDER — AMLODIPINE BESYLATE 5 MG PO TABS: 2.5000 mg | ORAL_TABLET | Freq: Every day | ORAL | Status: DC

## 2021-10-13 MED ORDER — MAGNESIUM OXIDE -MG SUPPLEMENT 400 (240 MG) MG PO TABS
200.0000 mg | ORAL_TABLET | Freq: Every day | ORAL | Status: DC
  Administered 2021-10-14: 200 mg via ORAL
  Filled 2021-10-13: qty 1

## 2021-10-13 MED ORDER — INSULIN GLARGINE 100 UNIT/ML SOLOSTAR PEN: 20.0000 [IU] | PEN_INJECTOR | Freq: Every day | SUBCUTANEOUS | Status: DC

## 2021-10-13 MED ORDER — ATORVASTATIN CALCIUM 10 MG PO TABS
10.0000 mg | ORAL_TABLET | Freq: Every evening | ORAL | Status: DC
  Administered 2021-10-13: 10 mg via ORAL
  Filled 2021-10-13: qty 1

## 2021-10-13 MED ORDER — ACETAMINOPHEN 650 MG RE SUPP: 650.0000 mg | Freq: Four times a day (QID) | RECTAL | Status: DC | PRN

## 2021-10-13 MED ORDER — SENNA 8.6 MG PO TABS
2.0000 | ORAL_TABLET | Freq: Every day | ORAL | Status: DC
  Administered 2021-10-14: 17.2 mg via ORAL
  Filled 2021-10-13: qty 2

## 2021-10-13 NOTE — H&P (Addendum)
History and Physical    Catherine Burke DZH:[id number] DOB: 04/01/1941 DOA: 10/13/2021  PCP: Catherine Crouch, MD  Patient coming from: Home  I have personally briefly reviewed patient's old medical records in [place]  Chief Complaint: Chest/abdominal pain  HPI: Catherine Burke is a 81 y.o. female with medical history significant of ESRD on PD, type 2 diabetes mellitus, hypertension, GERD, arthritis, hyperlipidemia who presented to ED with complaint of abdominal/chest pain.  Patient is pointing out towards the epigastric area but also states that her pain that started yesterday, has been intermittent is radiating to the both arms.  Intensity 6-7 out of 10.  She describes it as sharp.  No aggravating or relieving factor.  She has associated nausea, chills but no sweating or shortness of breath.  She has remained afebrile, no problem with bowel movements or urination.  She tells me that both days yesterday and today, her pain was worse when she woke up in the morning especially when she sat up at the side of the bed, she did not have any pain as long as she was laying flat.  Also complains of some cough with clear sputum.  No other complaint.  No recent travel or any sick contact.  Denies any pain in the leg.  No previous history of blood clots.  She is not on any anticoagulation.  ED Course: Upon arrival to ED, she was hemodynamically stable however chest x-ray showed possible free intraperitoneal air but no infiltrates.  This prompted ED physician to do CT abdomen which showed moderate pneumoperitoneum but radiology reported that this could be due to peritoneal dialysis catheter, no clear signs of intra-abdominal viscus perforation.  I was informed by ED physician that patient was significantly tender to palpation on the abdomen however on my examination, abdomen is completely benign.  General surgery has been consulted and are going to evaluate the patient in the ED.  Fluid from peritoneal  dialysis catheter was obtained which does not show any infection.  COVID test is not ordered.  I will order 1.  Review of Systems: As per HPI otherwise negative.    Past Medical History:  Diagnosis Date   Anemia    Arthritis    B12 deficiency 04/04/2021   Diabetes mellitus    GERD (gastroesophageal reflux disease)    HLD (hyperlipidemia)    Hypercholesterolemia    Hypertension    Kidney stones    Osteoporosis    Proteinuria     Past Surgical History:  Procedure Laterality Date   ABDOMINAL HYSTERECTOMY     BACK SURGERY     lumbar   BREAST CYST ASPIRATION     unsure of laterality    CAPD INSERTION N/A 02/13/2018   Procedure: LAPAROSCOPIC INSERTION CONTINUOUS AMBULATORY PERITONEAL DIALYSIS  (CAPD) CATHETER;  Surgeon: Algernon Huxley, MD;  Location: [facility] OR[facility];  Service: Vascular;  Laterality: N/A;   CAPD INSERTION N/A 07/21/2021   Procedure: LAPAROSCOPIC INSERTION CONTINUOUS AMBULATORY PERITONEAL DIALYSIS  (CAPD) CATHETER;  Surgeon: Ronny Bacon, MD;  Location: [place] [facility];  Service: General;  Laterality: N/A;   CAPD REMOVAL N/A 07/21/2021   Procedure: REMOVAL CONTINUOUS AMBULATORY PERITONEAL DIALYSIS  (CAPD) CATHETER;  Surgeon: Ronny Bacon, MD;  Location: [facility];  Service: General;  Laterality: N/A;   CHOLECYSTECTOMY     EYE SURGERY     bilateral cataract    NASAL SEPTUM SURGERY       reports that she has never smoked. She has never used  smokeless tobacco. She reports that she does not drink alcohol and does not use drugs.  Allergies  Allergen Reactions   Levofloxacin Other (See Comments)    Throat Swelling    Penicillins Anaphylaxis   Other    Penicillin G Other (See Comments)   Sulfa Antibiotics     Family History  Problem Relation Age of Onset   Other Mother    Heart attack Father    Diabetes Maternal Grandmother    Breast cancer Paternal Aunt     Prior to Admission medications   Medication Sig Start Date End Date Taking? Authorizing Provider  ACCU-CHEK  AVIVA PLUS test strip TESTING TWICE DAILY. 08/11/14   Susy Frizzle, MD  Acetaminophen (TYLENOL) 325 MG CAPS Take 1 capsule by mouth daily as needed (pain).    [provider]  amLODipine (NORVASC) 2.5 MG tablet Take 2.5 mg by mouth daily. 04/22/21   [provider]  APAP-MAG SALICYLATE-CAFFEINE PO  once 03/17/20   [provider]  atorvastatin (LIPITOR) 10 MG tablet Take 10 mg by mouth daily. 07/28/20   [provider]  B Complex-C-Biotin-E-Min-FA (DIALYVITE 5000) 5 MG TABS Take 1 tablet by mouth daily. 10/27/20   [provider]  cetirizine (ZYRTEC) 10 MG tablet Take by mouth. 06/09/21   [provider]  cloNIDine (CATAPRES) 0.1 MG tablet Take by mouth. 07/19/20   [provider]  hydrALAZINE (APRESOLINE) 100 MG tablet Take 1 tablet (100 mg total) by mouth every 8 (eight) hours. 10/13/17 07/21/20  Henreitta Leber, MD  Insulin Glargine (LANTUS SOLOSTAR) 100 UNIT/ML Solostar Pen Inject 20 Units into the skin at bedtime.  12/11/18   [provider]  Insulin Pen Needle 31G X 5 MM MISC As directed 03/31/15   Alycia Rossetti, MD  isosorbide mononitrate (IMDUR) 30 MG 24 hr tablet Take 30 mg by mouth daily. 08/12/20   [provider]  Magnesium 400 MG CAPS Take 1 tablet by mouth daily. 08/31/21   [provider]  MAGNESIUM-OXIDE 400 (240 Mg) MG tablet Take 1 tablet by mouth daily. 07/05/21   [provider]  omeprazole (PRILOSEC) 40 MG capsule Take 40 mg by mouth daily.    [provider]  potassium chloride (KLOR-CON) 10 MEQ tablet Take 10 mEq by mouth daily. 03/11/20   [provider]  senna (SENOKOT) 8.6 MG tablet Take 2 tablets by mouth daily.    [provider]  spironolactone (ALDACTONE) 25 MG tablet Take 1 tablet (25 mg total) by mouth daily. 10/14/17 07/21/20  Henreitta Leber, MD  telmisartan (MICARDIS) 40 MG tablet Take by mouth. 06/21/21   [provider]  torsemide  (DEMADEX) 100 MG tablet Take 100 mg by mouth daily as needed. 04/25/21   [provider]  Vitamin D, Ergocalciferol, (DRISDOL) 1.25 MG (50000 UNIT) CAPS capsule Take 50,000 Units by mouth once a week. 06/21/21   [provider]    Physical Exam: Vitals:   10/13/21 1136 10/13/21 1137 10/13/21 1501  BP: (!) 156/74  (!) 150/70  Pulse: 88  72  Resp: 20  18  Temp: 98.4 F (36.9 C)    SpO2: 93%  99%  Weight:  54 kg   Height:  5\' 4"  (1.626 m)     Constitutional: NAD, calm, comfortable Vitals:   10/13/21 1136 10/13/21 1137 10/13/21 1501  BP: (!) 156/74  (!) 150/70  Pulse: 88  72  Resp: 20  18  Temp: 98.4 F (36.9 C)  SpO2: 93%  99%  Weight:  54 kg   Height:  5\' 4"  (1.626 m)    Eyes: PERRL, lids and conjunctivae normal ENMT: Mucous membranes are moist. Posterior pharynx clear of any exudate or lesions.Normal dentition.  Neck: normal, supple, no masses, no thyromegaly Respiratory: clear to auscultation bilaterally, no wheezing, no crackles. Normal respiratory effort. No accessory muscle use.  Cardiovascular: Regular rate and rhythm, no murmurs / rubs / gallops. No extremity edema. 2+ pedal pulses. No carotid bruits.  Abdomen: no tenderness, no masses palpated. No hepatosplenomegaly. Bowel sounds positive.  Musculoskeletal: no clubbing / cyanosis. No joint deformity upper and lower extremities. Good ROM, no contractures. Normal muscle tone.  Skin: no rashes, lesions, ulcers. No induration Neurologic: CN 2-12 grossly intact. Sensation intact, DTR normal. Strength 5/5 in all 4.  Psychiatric: Normal judgment and insight. Alert and oriented x 3. Normal mood.    Labs on Admission: I have personally reviewed following labs and imaging studies  CBC: Recent Labs  Lab 10/13/21 1136  WBC 6.7  HGB 9.3*  HCT 29.6*  MCV 88.9  PLT 277   Basic Metabolic Panel: Recent Labs  Lab 10/13/21 1136  NA 135  K 3.5  CL 98  CO2 27  GLUCOSE 211*  BUN 37*  CREATININE 5.76*   CALCIUM 8.1*   GFR: Estimated Creatinine Clearance: 6.4 mL/min (A) (by C-G formula based on SCr of 5.76 mg/dL (H)). Liver Function Tests: No results for input(s): AST, ALT, ALKPHOS, BILITOT, PROT, ALBUMIN in the last 168 hours. No results for input(s): LIPASE, AMYLASE in the last 168 hours. No results for input(s): AMMONIA in the last 168 hours. Coagulation Profile: No results for input(s): INR, PROTIME in the last 168 hours. Cardiac Enzymes: No results for input(s): CKTOTAL, CKMB, CKMBINDEX, TROPONINI in the last 168 hours. BNP (last 3 results) No results for input(s): PROBNP in the last 8760 hours. HbA1C: No results for input(s): HGBA1C in the last 72 hours. CBG: No results for input(s): GLUCAP in the last 168 hours. Lipid Profile: No results for input(s): CHOL, HDL, LDLCALC, TRIG, CHOLHDL, LDLDIRECT in the last 72 hours. Thyroid Function Tests: No results for input(s): TSH, T4TOTAL, FREET4, T3FREE, THYROIDAB in the last 72 hours. Anemia Panel: No results for input(s): VITAMINB12, FOLATE, FERRITIN, TIBC, IRON, RETICCTPCT in the last 72 hours. Urine analysis:    Component Value Date/Time   COLORURINE YELLOW (A) 07/20/2021 1739   APPEARANCEUR CLOUDY (A) 07/20/2021 1739   LABSPEC 1.016 07/20/2021 1739   PHURINE 7.0 07/20/2021 1739   GLUCOSEU 150 (A) 07/20/2021 1739   HGBUR SMALL (A) 07/20/2021 1739   BILIRUBINUR NEGATIVE 07/20/2021 1739   KETONESUR NEGATIVE 07/20/2021 1739   PROTEINUR 100 (A) 07/20/2021 1739   UROBILINOGEN 0.2 02/24/2015 0853   NITRITE NEGATIVE 07/20/2021 1739   LEUKOCYTESUR LARGE (A) 07/20/2021 1739    Radiological Exams on Admission: CT ABDOMEN PELVIS WO CONTRAST  Result Date: 10/13/2021 CLINICAL DATA:  Epigastric abdominal pain. EXAM: CT ABDOMEN AND PELVIS WITHOUT CONTRAST TECHNIQUE: Multidetector CT imaging of the abdomen and pelvis was performed following the standard protocol without IV contrast. RADIATION DOSE REDUCTION: This exam was performed  according to the departmental dose-optimization program which includes automated exposure control, adjustment of the mA and/or kV according to patient size and/or use of iterative reconstruction technique. COMPARISON:  Radiograph of same day. FINDINGS: Lower chest: Small bilateral pleural effusions are noted with minimal adjacent subsegmental atelectasis. Hepatobiliary: Status post cholecystectomy. No biliary dilatation is noted. Slightly nodular hepatic contours  are noted suggesting possible hepatic cirrhosis. Pancreas: Unremarkable. No pancreatic ductal dilatation or surrounding inflammatory changes. Spleen: Normal in size without focal abnormality. Adrenals/Urinary Tract: Adrenal glands appear normal. Left renal atrophy is noted. Right renal cysts are noted. No hydronephrosis or renal obstruction is noted. Urinary bladder is unremarkable. 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 Stomach/Bowel: Stomach is within normal limits. Appendix appears normal. No evidence of bowel wall thickening, distention, or inflammatory changes. Vascular/Lymphatic: Aortic atherosclerosis. No enlarged abdominal or pelvic lymph nodes. Reproductive: Status post hysterectomy. No adnexal masses. Other: Peritoneal dialysis catheter is noted with coiled tip seen in the pelvis. Small amount of free fluid is noted in the pelvis. There is also noted moderate pneumoperitoneum in the epigastric region as noted on prior radiograph of same day. This could be related to the presence of the dialysis catheter. Musculoskeletal: No acute or significant osseous findings. IMPRESSION: Moderate pneumoperitoneum is noted in the epigastric region as noted on radiograph of same day. This most likely is due to the presence of peritoneal dialysis catheter. The possibility of rupture of hollow viscus cannot be excluded, although there are no other signs to suggest bowel perforation on this study. These results were called by telephone at the time of  interpretation on 10/13/2021 at 2:24 pm to provider [facility] , who verbally acknowledged these results. 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 Small bilateral pleural effusions with adjacent subsegmental atelectasis. Aortic Atherosclerosis (ICD10-I70.0). Electronically Signed   By: Marijo Conception M.D.   On: 10/13/2021 14:25   DG Chest 2 View  Result Date: 10/13/2021 CLINICAL DATA:  Chest pain beginning yesterday. Shortness of breath. Nausea. EXAM: CHEST - 2 VIEW COMPARISON:  04/25/2021 FINDINGS: Free intraperitoneal air is seen beneath both hemidiaphragms. Mild cardiomegaly stable. Mild scarring is seen in both lung bases. No evidence of acute infiltrate or pulmonary edema. Stable mild thoracic dextroscoliosis. IMPRESSION: Free intraperitoneal air. This may be due to peritoneal dialysis, although perforated viscus cannot be excluded. Recommend clinical correlation, and consider abdomen pelvis CT if warranted. Mild cardiomegaly and bibasilar scarring. No active lung disease. Critical Value/emergent results were called by telephone at the time of interpretation on 10/13/2021 at 12:24 pm to provider [facility] , who verbally acknowledged these results. Electronically Signed   By: Marlaine Hind M.D.   On: 10/13/2021 12:28    EKG: Independently reviewed.  Normal sinus rhythm  Assessment/Plan Principal Problem:   Abdominal pain   Abdominal/chest pain: Troponin x2 are slightly elevated but flat, arguing against ACS, slightly elevated troponin could be due to ESRD status.  CT abdomen negative for any acute pathology.  General surgery is going to assess for pneumoperitoneum.  One of the differential diagnoses that I can think of is possible PE.  No leg pain, no calf tenderness.  No history of blood clot in the past.  I will start with stat D-dimer.  I doubt aortic dissection due to the fact that patient is very comfortable, vital signs stable and peripheral pulses are  palpable.  Addendum/update: Patient's D-dimer is significantly elevated.  Understandably, D-dimer can be elevated in ESRD however in the setting when we do not have any other source of her chest pain with significant elevation of D-dimer, will proceed with CTA chest to rule out PE.  Patient is already on dialysis.  ESRD on PD: I will consult nephrology.  Type 2 diabetes mellitus: I will resume her Lantus 20 units and place her on SSI.  Essential hypertension: Resume home medications.  Hyperlipidemia: Resume statin.  DVT prophylaxis:   Heparin Code Status: DNR Family Communication: None present at bedside.  Plan of care discussed with patient in length and he verbalized understanding and agreed with it. Disposition Plan: Potential home tomorrow Consults called: Nephrology  Darliss Cheney MD [facility] Please page via [place] and do not message via secure chat for urgent patient care matters. Secure chat can be used for non urgent patient care matters. 10/13/2021, 3:38 PM  To contact the attending provider between 7A-7P or the covering provider during after hours 7P-7A, please log into the web site [email or web address]

## 2021-10-13 NOTE — Plan of Care (Signed)
°  Problem: Health Behavior/Discharge Planning: Goal: Ability to manage health-related needs will improve Outcome: Progressing   Problem: Clinical Measurements: Goal: Ability to maintain clinical measurements within normal limits will improve Outcome: Progressing Goal: Will remain free from infection Outcome: Progressing Goal: Diagnostic test results will improve Outcome: Progressing Goal: Respiratory complications will improve Outcome: Progressing Goal: Cardiovascular complication will be avoided Outcome: Progressing   Problem: Education: Goal: Knowledge of General Education information will improve Description: Including pain rating scale, medication(s)/side effects and non-pharmacologic comfort measures Outcome: Progressing   Problem: Health Behavior/Discharge Planning: Goal: Ability to manage health-related needs will improve Outcome: Progressing   Problem: Clinical Measurements: Goal: Ability to maintain clinical measurements within normal limits will improve Outcome: Progressing Goal: Will remain free from infection Outcome: Progressing Goal: Diagnostic test results will improve Outcome: Progressing Goal: Respiratory complications will improve Outcome: Progressing Goal: Cardiovascular complication will be avoided Outcome: Progressing   Problem: Activity: Goal: Risk for activity intolerance will decrease Outcome: Progressing   Problem: Nutrition: Goal: Adequate nutrition will be maintained Outcome: Progressing   Problem: Coping: Goal: Level of anxiety will decrease Outcome: Progressing   Problem: Elimination: Goal: Will not experience complications related to bowel motility Outcome: Progressing Goal: Will not experience complications related to urinary retention Outcome: Progressing   Problem: Pain Managment: Goal: General experience of comfort will improve Outcome: Progressing   Problem: Safety: Goal: Ability to remain free from injury will  improve Outcome: Progressing   Problem: Skin Integrity: Goal: Risk for impaired skin integrity will decrease Outcome: Progressing

## 2021-10-13 NOTE — Progress Notes (Signed)
Patient sent to floor with out IV access. MD aware and requests [place] to have one placed as soon as possible

## 2021-10-13 NOTE — Consult Note (Signed)
[facility] SURGICAL CONSULTATION NOTE (initial) - cpt: 90300   HISTORY OF PRESENT ILLNESS (HPI):  81 y.o. female presented to [facility] ED today for evaluation of chest pain and SOB. Patient reports that she has had about 24 hours of lower, central, chest discomfort which radiates to her bilateral shoulders. She had some relief but could not rest at home, so she decided to present to the ED. No fever, chills, cough, abdominal pain, emesis, or bowel changes. She did have some mild nausea. She is known to our service following PD catheter exchange in November with Dr Christian Mate. She states things have been great and she has had no issues. She did report to me that on the night prior to presentation, she accidentally connected to her machine too early and hit run, the machine then primed, and likely instilled air prior to running PD. Work up in the ED was revealed a normal WBC at 6.7K, hgb improved from previous measurements at 9.3, renal function consistent with ESRD with sCr - 5.76. She did have CXR which was concerning for some degree of pneumoperitoneum and follow up CT Abdomen/Pelvis revealed the same but there is no evidence to suggest hollow viscus injury. She was admitted to the medicine service.   Surgery is consulted by emergency medicine physician Dr. Vladimir Crofts, MD in this context for evaluation and management of pneumoperitoneum in setting of PD catheter.   PAST MEDICAL HISTORY (PMH):  Past Medical History:  Diagnosis Date   Anemia    Arthritis    B12 deficiency 04/04/2021   Diabetes mellitus    GERD (gastroesophageal reflux disease)    HLD (hyperlipidemia)    Hypercholesterolemia    Hypertension    Kidney stones    Osteoporosis    Proteinuria      PAST SURGICAL HISTORY ([place]):  Past Surgical History:  Procedure Laterality Date   ABDOMINAL HYSTERECTOMY     BACK SURGERY     lumbar   BREAST CYST ASPIRATION     unsure of laterality    CAPD INSERTION N/A 02/13/2018    Procedure: LAPAROSCOPIC INSERTION CONTINUOUS AMBULATORY PERITONEAL DIALYSIS  (CAPD) CATHETER;  Surgeon: Algernon Huxley, MD;  Location: [facility] ORS;  Service: Vascular;  Laterality: N/A;   CAPD INSERTION N/A 07/21/2021   Procedure: LAPAROSCOPIC INSERTION CONTINUOUS AMBULATORY PERITONEAL DIALYSIS  (CAPD) CATHETER;  Surgeon: Ronny Bacon, MD;  Location: [facility] ORS;  Service: General;  Laterality: N/A;   CAPD REMOVAL N/A 07/21/2021   Procedure: REMOVAL CONTINUOUS AMBULATORY PERITONEAL DIALYSIS  (CAPD) CATHETER;  Surgeon: Ronny Bacon, MD;  Location: [facility] ORS;  Service: General;  Laterality: N/A;   CHOLECYSTECTOMY     EYE SURGERY     bilateral cataract    NASAL SEPTUM SURGERY       MEDICATIONS:  Prior to Admission medications   Medication Sig Start Date End Date Taking? Authorizing Provider  ACCU-CHEK AVIVA PLUS test strip TESTING TWICE DAILY. 08/11/14   Susy Frizzle, MD  Acetaminophen (TYLENOL) 325 MG CAPS Take 1 capsule by mouth daily as needed (pain).    [provider]  amLODipine (NORVASC) 2.5 MG tablet Take 2.5 mg by mouth daily. 04/22/21   [provider]  APAP-MAG SALICYLATE-CAFFEINE PO  once 03/17/20   [provider]  atorvastatin (LIPITOR) 10 MG tablet Take 10 mg by mouth daily. 07/28/20   [provider]  B Complex-C-Biotin-E-Min-FA (DIALYVITE 5000) 5 MG TABS Take 1 tablet by mouth daily. 10/27/20   [provider]  cetirizine (  ZYRTEC) 10 MG tablet Take by mouth. 06/09/21   [provider]  cloNIDine (CATAPRES) 0.1 MG tablet Take by mouth. 07/19/20   [provider]  hydrALAZINE (APRESOLINE) 100 MG tablet Take 1 tablet (100 mg total) by mouth every 8 (eight) hours. 10/13/17 07/21/20  Henreitta Leber, MD  Insulin Glargine (LANTUS SOLOSTAR) 100 UNIT/ML Solostar Pen Inject 20 Units into the skin at bedtime.  12/11/18   [provider]  Insulin Pen Needle 31G X 5 MM MISC As directed 03/31/15   Alycia Rossetti, MD   isosorbide mononitrate (IMDUR) 30 MG 24 hr tablet Take 30 mg by mouth daily. 08/12/20   [provider]  MAGNESIUM-OXIDE 400 (240 Mg) MG tablet Take 1 tablet by mouth daily. 07/05/21   [provider]  omeprazole (PRILOSEC) 40 MG capsule Take 40 mg by mouth daily.    [provider]  potassium chloride (KLOR-CON) 10 MEQ tablet Take 10 mEq by mouth daily. 03/11/20   [provider]  senna (SENOKOT) 8.6 MG tablet Take 2 tablets by mouth daily.    [provider]  spironolactone (ALDACTONE) 25 MG tablet Take 1 tablet (25 mg total) by mouth daily. 10/14/17 07/21/20  Henreitta Leber, MD  telmisartan (MICARDIS) 40 MG tablet Take by mouth. 06/21/21   [provider]  torsemide (DEMADEX) 100 MG tablet Take 100 mg by mouth daily as needed. 04/25/21   [provider]  Vitamin D, Ergocalciferol, (DRISDOL) 1.25 MG (50000 UNIT) CAPS capsule Take 50,000 Units by mouth once a week. 06/21/21   [provider]     ALLERGIES:  Allergies  Allergen Reactions   Levofloxacin Other (See Comments)    Throat Swelling    Penicillins Anaphylaxis   Other    Penicillin G Other (See Comments)   Sulfa Antibiotics      SOCIAL HISTORY:  Social History   Socioeconomic History   Marital status: Married    Spouse name: Not on file   Number of children: 3   Years of education: Not on file   Highest education level: Not on file  Occupational History   Occupation: retired  Tobacco Use   Smoking status: Never   Smokeless tobacco: Never  Vaping Use   Vaping Use: Never used  Substance and Sexual Activity   Alcohol use: No    Alcohol/week: 0.0 standard drinks   Drug use: No   Sexual activity: Yes  Other Topics Concern   Not on file  Social History Narrative   Not on file   Social Determinants of Health   Financial Resource Strain: Not on file  Food Insecurity: Not on file  Transportation Needs: Not on file  Physical Activity: Not on file   Stress: Not on file  Social Connections: Not on file  Intimate Partner Violence: Not on file     FAMILY HISTORY:  Family History  Problem Relation Age of Onset   Other Mother    Heart attack Father    Diabetes Maternal Grandmother    Breast cancer Paternal Aunt       REVIEW OF SYSTEMS:  Review of Systems  Constitutional:  Negative for chills and fever.  HENT:  Negative for congestion and sore throat.   Respiratory:  Positive for shortness of breath. Negative for cough.   Cardiovascular:  Positive for chest pain. Negative for palpitations.  Gastrointestinal:  Positive for nausea (Mild). Negative for abdominal pain, diarrhea and vomiting.  All other systems reviewed and are  negative.  VITAL SIGNS:  Temp:  [98.4 F (36.9 C)] 98.4 F (36.9 C) (02/09 1136) Pulse Rate:  [72-88] 72 (02/09 1501) Resp:  [18-20] 18 (02/09 1501) BP: (150-156)/(70-74) 150/70 (02/09 1501) SpO2:  [93 %-99 %] 99 % (02/09 1501) Weight:  [54 kg] 54 kg (02/09 1137)     Height: 5\' 4"  (162.6 cm) Weight: 54 kg BMI (Calculated): 20.42   INTAKE/OUTPUT:  No intake/output data recorded.  PHYSICAL EXAM:  Physical Exam Vitals and nursing note reviewed.  Constitutional:      General: She is not in acute distress.    Appearance: She is well-developed and normal weight. She is not ill-appearing.  HENT:     Head: Normocephalic and atraumatic.  Eyes:     Extraocular Movements: Extraocular movements intact.     Pupils: Pupils are equal, round, and reactive to light.  Cardiovascular:     Rate and Rhythm: Normal rate and regular rhythm.     Pulses: Normal pulses.  Pulmonary:     Effort: Pulmonary effort is normal. No respiratory distress.     Breath sounds: Normal breath sounds.  Abdominal:     General: Abdomen is flat. A surgical scar is present. There is no distension.     Palpations: Abdomen is soft.     Tenderness: There is no abdominal tenderness. There is no guarding or rebound.     Comments: Abdomen  is soft, she is sore expectedly at peritoneal catheter site, non-distended. No rebound/guarding. I do not think she is peritonitic at all  Genitourinary:    Comments: Deferred Musculoskeletal:     Right lower leg: No edema.     Left lower leg: No edema.  Skin:    General: Skin is warm and dry.     Coloration: Skin is not jaundiced.     Findings: No erythema.  Neurological:     General: No focal deficit present.     Mental Status: She is alert and oriented to person, place, and time.  Psychiatric:        Mood and Affect: Mood normal.        Behavior: Behavior normal.     Labs:  CBC Latest Ref Rng & Units 10/13/2021 07/22/2021 07/21/2021  WBC 4.0 - 10.5 K/uL 6.7 4.1 3.3(L)  Hemoglobin 12.0 - 15.0 g/dL 9.3(L) 7.5(L) 7.7(L)  Hematocrit 36.0 - 46.0 % 29.6(L) 25.5(L) 24.3(L)  Platelets 150 - 400 K/uL 211 260 219   CMP Latest Ref Rng & Units 10/13/2021 07/22/2021 07/21/2021  Glucose 70 - 99 mg/dL 211(H) 194(H) 133(H)  BUN 8 - 23 mg/dL 37(H) 72(H) 60(H)  Creatinine 0.44 - 1.00 mg/dL 5.76(H) 9.03(H) 8.98(H)  Sodium 135 - 145 mmol/L 135 136 137  Potassium 3.5 - 5.1 mmol/L 3.5 5.0 3.7  Chloride 98 - 111 mmol/L 98 102 99  CO2 22 - 32 mmol/L 27 19(L) 24  Calcium 8.9 - 10.3 mg/dL 8.1(L) 7.9(L) 7.9(L)  Total Protein 6.5 - 8.1 g/dL - - 6.1(L)  Total Bilirubin 0.3 - 1.2 mg/dL - - 0.7  Alkaline Phos 38 - 126 U/L - - 52  AST 15 - 41 U/L - - 16  ALT 0 - 44 U/L - - 11     Imaging studies:   CT Abdomen/Pelvis (10/13/2021) personally reviewed which does show pneumoperitoneum however there is no other intra-abdominal findings or changes to suggest hollow viscus perforation, PD catheter in position, and radiologist report reviewed below:  IMPRESSION: Moderate pneumoperitoneum is noted in the epigastric region  as noted on radiograph of same day. This most likely is due to the presence of peritoneal dialysis catheter. The possibility of rupture of hollow viscus cannot be excluded, although there  are no other signs to suggest bowel perforation on this study. These results were called by telephone at the time of interpretation on 10/13/2021 at 2:24 pm to provider [facility] , who verbally acknowledged these results.   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   Small bilateral pleural effusions with adjacent subsegmental atelectasis.   Aortic Atherosclerosis (ICD10-I70.0).   CXR (10/13/2021) personally reviewed, pneumoperitoneum, and radiologist report reviewed below:  IMPRESSION: Free intraperitoneal air. This may be due to peritoneal dialysis, although perforated viscus cannot be excluded. Recommend clinical correlation, and consider abdomen pelvis CT if warranted.   Mild cardiomegaly and bibasilar scarring. No active lung disease.    Assessment/Plan: (ICD-10's: R10.13) 81 y.o. female presenting with chest pain and SOB x24 hours incidentally found to have pneumoperitoneum, which I suspect is secondary to accidentally instilling air the night prior while running PD, which could be irritating diaphragm and causing her symptoms. Hollow viscus injury is certainly a possibility, but her examination is without evidence of peritonitis, there are no other suspicious findings on CT, and her labs are reassuring.   - Low suspicion for hollow viscus injury, suspect pneumoperitoneum is secondary to PD catheter as explained above. No need for emergent surgical intervention  - Will get KUB + CXR in AM to ensure pneumoperitoneum is not worsening  - Okay for diet  - Okay to access and use PD catheter from surgical perspective  - Pain control prn  - Further management per admitting service; I will follow  All of the above findings and recommendations were discussed with the patient, and all of patient's questions were answered to her expressed satisfaction.  Thank you for the opportunity to participate in this patient's care.   -- Edison Simon,  PA-C [facility] 10/13/2021, 3:22 PM [phone] M-F: 7am - 4pm

## 2021-10-13 NOTE — ED Triage Notes (Signed)
Pt to ED for generalized chest pain radiating to bilateral arms that started yesterday. +shob. +nausea.  Increased pain with ambulation.  Last dialysis last night

## 2021-10-13 NOTE — ED Provider Notes (Signed)
[facility] Provider Note    Event Date/Time   First MD Initiated Contact with Patient 10/13/21 1145     (approximate)   History   Chest Pain   HPI  Catherine Burke is a 81 y.o. female who presents to the ED for evaluation of Chest Pain   I reviewed outpatient cardiology visit from 1 week ago where she was seen by a PA for evaluation of chronic shortness of breath on exertion and fatigue, stress test was ordered.  She has history of CAD by CT scan. ESRD on peritoneal dialysis, HTN, HLD and DM.  Patient presents to the ED for evaluation of epigastric discomfort.  She initially reports chest pain, but further questioning indicates mostly epigastric and abdominal pain.  She reports peritoneal discomfort when she takes a deep breath, has pain to her midsection and her epigastrium.  This has been present for the past couple days.  She reports it is mostly with movement and deep breath, and does report associated nausea.  Denies emesis or stool changes.  Denies fevers, shortness of breath or cough.  Denies exertional chest discomfort.  Physical Exam   Triage Vital Signs: ED Triage Vitals  Enc Vitals Group     BP 10/13/21 1136 (!) 156/74     Pulse Rate 10/13/21 1136 88     Resp 10/13/21 1136 20     Temp 10/13/21 1136 98.4 F (36.9 C)     Temp src --      SpO2 10/13/21 1136 93 %     Weight 10/13/21 1137 119 lb (54 kg)     Height 10/13/21 1137 5\' 4"  (1.626 m)     Head Circumference --      Peak Flow --      Pain Score 10/13/21 1137 2     Pain Loc --      Pain Edu? --      Excl. in [place]? --     Most recent vital signs: Vitals:   10/13/21 1136 10/13/21 1501  BP: (!) 156/74 (!) 150/70  Pulse: 88 72  Resp: 20 18  Temp: 98.4 F (36.9 C)   SpO2: 93% 99%    General: Awake, no distress.  Pleasant and conversational in full sentences. CV:  Good peripheral perfusion.  RRR Resp:  Normal effort.  CTA B Abd:  No distention.  Epigastric tenderness with  voluntary guarding.  Lower abdominal tenderness is also present, but seems less severe. MSK:  No deformity noted.  Neuro:  No focal deficits appreciated. Other:     ED Results / Procedures / Treatments   Labs (all labs ordered are listed, but only abnormal results are displayed) Labs Reviewed  BASIC METABOLIC PANEL - Abnormal; Notable for the following components:      Result Value   Glucose, Bld 211 (*)    BUN 37 (*)    Creatinine, Ser 5.76 (*)    Calcium 8.1 (*)    GFR, Estimated 7 (*)    All other components within normal limits  CBC - Abnormal; Notable for the following components:   RBC 3.33 (*)    Hemoglobin 9.3 (*)    HCT 29.6 (*)    RDW 17.9 (*)    All other components within normal limits  BODY FLUID CELL COUNT WITH DIFFERENTIAL - Abnormal; Notable for the following components:   Appearance, Fluid CLEAR (*)    Neutrophil Count, Fluid 45 (*)    Monocyte-Macrophage-Serous Fluid 37 (*)  All other components within normal limits  TROPONIN I (HIGH SENSITIVITY) - Abnormal; Notable for the following components:   Troponin I (High Sensitivity) 31 (*)    All other components within normal limits  TROPONIN I (HIGH SENSITIVITY) - Abnormal; Notable for the following components:   Troponin I (High Sensitivity) 28 (*)    All other components within normal limits  BODY FLUID CULTURE W GRAM STAIN  RESP PANEL BY RT-PCR (FLU A&B, COVID) [id number]  PATHOLOGIST SMEAR REVIEW  D-DIMER, QUANTITATIVE  CBC  CREATININE, SERUM  MAGNESIUM    EKG Sinus rhythm, rate of 88 bpm.  Normal axis and intervals.  Nonspecific ST changes laterally and inferiorly. Quite similar to EKG from August, as most recent comparison  RADIOLOGY 2 view CXR reviewed by me with clear lungs, but free air under the diaphragm CT abdomen/pelvis reviewed by me with moderate volume pneumoperitoneum.  Official radiology report(s): CT ABDOMEN PELVIS WO CONTRAST  Result Date: 10/13/2021 CLINICAL DATA:  Epigastric  abdominal pain. EXAM: CT ABDOMEN AND PELVIS WITHOUT CONTRAST TECHNIQUE: Multidetector CT imaging of the abdomen and pelvis was performed following the standard protocol without IV contrast. RADIATION DOSE REDUCTION: This exam was performed according to the departmental dose-optimization program which includes automated exposure control, adjustment of the mA and/or kV according to patient size and/or use of iterative reconstruction technique. COMPARISON:  Radiograph of same day. FINDINGS: Lower chest: Small bilateral pleural effusions are noted with minimal adjacent subsegmental atelectasis. Hepatobiliary: Status post cholecystectomy. No biliary dilatation is noted. Slightly nodular hepatic contours are noted suggesting possible hepatic cirrhosis. Pancreas: Unremarkable. No pancreatic ductal dilatation or surrounding inflammatory changes. Spleen: Normal in size without focal abnormality. Adrenals/Urinary Tract: Adrenal glands appear normal. Left renal atrophy is noted. Right renal cysts are noted. No hydronephrosis or renal obstruction is noted. Urinary bladder is unremarkable. 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 Stomach/Bowel: Stomach is within normal limits. Appendix appears normal. No evidence of bowel wall thickening, distention, or inflammatory changes. Vascular/Lymphatic: Aortic atherosclerosis. No enlarged abdominal or pelvic lymph nodes. Reproductive: Status post hysterectomy. No adnexal masses. Other: Peritoneal dialysis catheter is noted with coiled tip seen in the pelvis. Small amount of free fluid is noted in the pelvis. There is also noted moderate pneumoperitoneum in the epigastric region as noted on prior radiograph of same day. This could be related to the presence of the dialysis catheter. Musculoskeletal: No acute or significant osseous findings. IMPRESSION: Moderate pneumoperitoneum is noted in the epigastric region as noted on radiograph of same day. This most likely is due to the  presence of peritoneal dialysis catheter. The possibility of rupture of hollow viscus cannot be excluded, although there are no other signs to suggest bowel perforation on this study. These results were called by telephone at the time of interpretation on 10/13/2021 at 2:24 pm to provider [facility] , who verbally acknowledged these results. 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 Small bilateral pleural effusions with adjacent subsegmental atelectasis. Aortic Atherosclerosis (ICD10-I70.0). Electronically Signed   By: Marijo Conception M.D.   On: 10/13/2021 14:25   DG Chest 2 View  Result Date: 10/13/2021 CLINICAL DATA:  Chest pain beginning yesterday. Shortness of breath. Nausea. EXAM: CHEST - 2 VIEW COMPARISON:  04/25/2021 FINDINGS: Free intraperitoneal air is seen beneath both hemidiaphragms. Mild cardiomegaly stable. Mild scarring is seen in both lung bases. No evidence of acute infiltrate or pulmonary edema. Stable mild thoracic dextroscoliosis. IMPRESSION: Free intraperitoneal air. This may be  due to peritoneal dialysis, although perforated viscus cannot be excluded. Recommend clinical correlation, and consider abdomen pelvis CT if warranted. Mild cardiomegaly and bibasilar scarring. No active lung disease. Critical Value/emergent results were called by telephone at the time of interpretation on 10/13/2021 at 12:24 pm to provider [facility] , who verbally acknowledged these results. Electronically Signed   By: Marlaine Hind M.D.   On: 10/13/2021 12:28    PROCEDURES and INTERVENTIONS:  .1-3 Lead EKG Interpretation Performed by: Vladimir Crofts, MD Authorized by: Vladimir Crofts, MD     Interpretation: normal     ECG rate:  70   ECG rate assessment: normal     Rhythm: sinus rhythm     Ectopy: none     Conduction: normal    Medications  amLODipine (NORVASC) tablet 2.5 mg (has no administration in time range)  atorvastatin (LIPITOR) tablet 10 mg (has no  administration in time range)  hydrALAZINE (APRESOLINE) tablet 100 mg (has no administration in time range)  isosorbide mononitrate (IMDUR) 24 hr tablet 30 mg (has no administration in time range)  spironolactone (ALDACTONE) tablet 25 mg (has no administration in time range)  irbesartan (AVAPRO) tablet 150 mg (has no administration in time range)  insulin glargine (LANTUS) Solostar Pen 20 Units (has no administration in time range)  pantoprazole (PROTONIX) EC tablet 40 mg (has no administration in time range)  senna (SENOKOT) tablet 17.2 mg (has no administration in time range)  Magnesium CAPS 1 tablet (has no administration in time range)  heparin injection 5,000 Units (has no administration in time range)  acetaminophen (TYLENOL) tablet 650 mg (has no administration in time range)    Or  acetaminophen (TYLENOL) suppository 650 mg (has no administration in time range)  ondansetron (ZOFRAN) tablet 4 mg (has no administration in time range)    Or  ondansetron (ZOFRAN) injection 4 mg (has no administration in time range)     IMPRESSION / MDM / ASSESSMENT AND PLAN / ED COURSE  I reviewed the triage vital signs and the nursing notes.  81 year old female on home PD presents to the ED with increasing pleuritic epigastric discomfort, likely symptomatic pneumoperitoneum, requiring surgical evaluation and observation admission.  She looks systemically well, but is quite tender to her epigastrium with some voluntary guarding.  I draw off some fluid from her PD catheter utilizing sterile technique and this does not show evidence of SBP.  Blood work with stigmata of ESRD, 2 flat troponins and anemia of chronic disease without evidence of leukocytosis, sepsis.  She has no GI symptoms I think a perforated viscus is less likely.  Due to the volume of air and her associated tenderness, consult with general surgery evaluates the patient.  I would feel comfortable sending this patient home, even if this is just  due to her PD catheter, so I consulted medicine who agrees to admit.  Clinical Course as of 10/13/21 1607  Thu Oct 13, 2021  1241 Utilizing sterile technique, I draws off about 10 cc of fluid from her PD catheter and it is sent to the lab for cell count analysis. [DS]  87 Dr. Juleen China in the ED to evaluate the patient [DS]  1520 I reevaluate the patient.  Still quite tender to her epigastrium and with voluntary guarding.  We discussed CT and x-ray results with moderate volume pneumoperitoneum. I consulted general surgery who agrees to see the patient in evaluation.  Due to her continued symptoms, I am concerned about sending her home.  I consulted with medicine who agrees to admit for observation. [DS]    Clinical Course User Index [DS] Vladimir Crofts, MD     FINAL CLINICAL IMPRESSION(S) / ED DIAGNOSES   Final diagnoses:  Pneumoperitoneum     Rx / DC Orders   ED Discharge Orders     None        Note:  This document was prepared using Dragon voice recognition software and may include unintentional dictation errors.   Vladimir Crofts, MD 10/13/21 [phone]

## 2021-10-13 NOTE — Progress Notes (Signed)
[facility]  ROUNDING NOTE   Subjective:   Catherine Burke was admitted to [facility] on 10/13/2021 for Pneumoperitoneum [K66.8] Abdominal pain [R10.9]  Last peritoneal treatment was last night  Patient's grandson is at bedside.   Patient feels she allowed air into the line on Wednesday night during dialysis. CT shows air in abdomen which is consistent with peritoneal dialysis.   Patient had significant abdominal pain and chest pain in the ED   Objective:  Vital signs in last 24 hours:  Temp:  [97.4 F (36.3 C)-98.4 F (36.9 C)] 97.4 F (36.3 C) (02/09 1638) Pulse Rate:  [65-88] 65 (02/09 1638) Resp:  [18-20] 18 (02/09 1638) BP: (150-175)/(67-74) 175/67 (02/09 1638) SpO2:  [93 %-99 %] 99 % (02/09 1638) Weight:  [54 kg] 54 kg (02/09 1137)  Weight change:  Filed Weights   10/13/21 1137  Weight: 54 kg    Intake/Output: No intake/output data recorded.   Intake/Output this shift:  No intake/output data recorded.  Physical Exam: General: NAD,   Head: Normocephalic, atraumatic. Moist oral mucosal membranes  Eyes: Anicteric, PERRL  Neck: Supple, trachea midline  Lungs:  Clear to auscultation  Heart: Regular rate and rhythm  Abdomen:  +epigastric tenderness  Extremities:  no peripheral edema.  Neurologic: Nonfocal, moving all four extremities  Skin: No lesions  Access: PD catheter    Basic Metabolic Panel: Recent Labs  Lab 10/13/21 1136 10/13/21 1651  NA 135  --   K 3.5  --   CL 98  --   CO2 27  --   GLUCOSE 211*  --   BUN 37*  --   CREATININE 5.76*  --   CALCIUM 8.1*  --   MG  --  1.8    Liver Function Tests: No results for input(s): AST, ALT, ALKPHOS, BILITOT, PROT, ALBUMIN in the last 168 hours. No results for input(s): LIPASE, AMYLASE in the last 168 hours. No results for input(s): AMMONIA in the last 168 hours.  CBC: Recent Labs  Lab 10/13/21 1136  WBC 6.7  HGB 9.3*  HCT 29.6*  MCV 88.9  PLT 211    Cardiac Enzymes: No  results for input(s): CKTOTAL, CKMB, CKMBINDEX, TROPONINI in the last 168 hours.  BNP: Invalid input(s): POCBNP  CBG: No results for input(s): GLUCAP in the last 168 hours.  Microbiology: Results for orders placed or performed during the hospital encounter of 10/13/21  Resp Panel by RT-PCR (Flu A&B, Covid) Nasopharyngeal Swab     Status: None   Collection Time: 10/13/21  3:23 PM   Specimen: Nasopharyngeal Swab; Nasopharyngeal(NP) swabs in vial transport medium  Result Value Ref Range Status   SARS Coronavirus 2 by RT PCR NEGATIVE NEGATIVE Final    Comment: (NOTE) SARS-CoV-2 target nucleic acids are NOT DETECTED.  The SARS-CoV-2 RNA is generally detectable in upper respiratory specimens during the acute phase of infection. The lowest concentration of SARS-CoV-2 viral copies this assay can detect is 138 copies/mL. A negative result does not preclude SARS-Cov-2 infection and should not be used as the sole basis for treatment or other patient management decisions. A negative result may occur with  improper specimen collection/handling, submission of specimen other than nasopharyngeal swab, presence of viral mutation(s) within the areas targeted by this assay, and inadequate number of viral copies(<138 copies/mL). A negative result must be combined with clinical observations, patient history, and epidemiological information. The expected result is Negative.  Fact Sheet for Patients:  EntrepreneurPulse.com.au  Fact Sheet for  Healthcare Providers:  IncredibleEmployment.be  This test is no t yet approved or cleared by the [place] and  has been authorized for detection and/or diagnosis of SARS-CoV-2 by FDA under an Emergency Use Authorization (EUA). This EUA will remain  in effect (meaning this test can be used) for the duration of the COVID-19 declaration under Section 564(b)(1) of the Act, 21 U.S.C.section 360bbb-3(b)(1), unless the  authorization is terminated  or revoked sooner.       Influenza A by PCR NEGATIVE NEGATIVE Final   Influenza B by PCR NEGATIVE NEGATIVE Final    Comment: (NOTE) The Xpert Xpress SARS-CoV-2/FLU/RSV plus assay is intended as an aid in the diagnosis of influenza from Nasopharyngeal swab specimens and should not be used as a sole basis for treatment. Nasal washings and aspirates are unacceptable for Xpert Xpress SARS-CoV-2/FLU/RSV testing.  Fact Sheet for Patients: EntrepreneurPulse.com.au  Fact Sheet for Healthcare Providers: IncredibleEmployment.be  This test is not yet approved or cleared by the [place] FDA and has been authorized for detection and/or diagnosis of SARS-CoV-2 by FDA under an Emergency Use Authorization (EUA). This EUA will remain in effect (meaning this test can be used) for the duration of the COVID-19 declaration under Section 564(b)(1) of the Act, 21 U.S.C. section 360bbb-3(b)(1), unless the authorization is terminated or revoked.  Performed at [facility], [address] [place], [place] [address]     Coagulation Studies: No results for input(s): LABPROT, INR in the last 72 hours.  Urinalysis: No results for input(s): COLORURINE, LABSPEC, PHURINE, GLUCOSEU, HGBUR, BILIRUBINUR, KETONESUR, PROTEINUR, UROBILINOGEN, NITRITE, LEUKOCYTESUR in the last 72 hours.  Invalid input(s): APPERANCEUR    Imaging: CT ABDOMEN PELVIS WO CONTRAST  Result Date: 10/13/2021 CLINICAL DATA:  Epigastric abdominal pain. EXAM: CT ABDOMEN AND PELVIS WITHOUT CONTRAST TECHNIQUE: Multidetector CT imaging of the abdomen and pelvis was performed following the standard protocol without IV contrast. RADIATION DOSE REDUCTION: This exam was performed according to the departmental dose-optimization program which includes automated exposure control, adjustment of the mA and/or kV according to patient size and/or use of iterative  reconstruction technique. COMPARISON:  Radiograph of same day. FINDINGS: Lower chest: Small bilateral pleural effusions are noted with minimal adjacent subsegmental atelectasis. Hepatobiliary: Status post cholecystectomy. No biliary dilatation is noted. Slightly nodular hepatic contours are noted suggesting possible hepatic cirrhosis. Pancreas: Unremarkable. No pancreatic ductal dilatation or surrounding inflammatory changes. Spleen: Normal in size without focal abnormality. Adrenals/Urinary Tract: Adrenal glands appear normal. Left renal atrophy is noted. Right renal cysts are noted. No hydronephrosis or renal obstruction is noted. Urinary bladder is unremarkable. 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 Stomach/Bowel: Stomach is within normal limits. Appendix appears normal. No evidence of bowel wall thickening, distention, or inflammatory changes. Vascular/Lymphatic: Aortic atherosclerosis. No enlarged abdominal or pelvic lymph nodes. Reproductive: Status post hysterectomy. No adnexal masses. Other: Peritoneal dialysis catheter is noted with coiled tip seen in the pelvis. Small amount of free fluid is noted in the pelvis. There is also noted moderate pneumoperitoneum in the epigastric region as noted on prior radiograph of same day. This could be related to the presence of the dialysis catheter. Musculoskeletal: No acute or significant osseous findings. IMPRESSION: Moderate pneumoperitoneum is noted in the epigastric region as noted on radiograph of same day. This most likely is due to the presence of peritoneal dialysis catheter. The possibility of rupture of hollow viscus cannot be excluded, although there are no other signs to suggest bowel perforation on this study. These  results were called by telephone at the time of interpretation on 10/13/2021 at 2:24 pm to provider [facility] , who verbally acknowledged these results. 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 Small bilateral pleural effusions with adjacent subsegmental atelectasis. Aortic Atherosclerosis (ICD10-I70.0). Electronically Signed   By: Marijo Conception M.D.   On: 10/13/2021 14:25   DG Chest 2 View  Result Date: 10/13/2021 CLINICAL DATA:  Chest pain beginning yesterday. Shortness of breath. Nausea. EXAM: CHEST - 2 VIEW COMPARISON:  04/25/2021 FINDINGS: Free intraperitoneal air is seen beneath both hemidiaphragms. Mild cardiomegaly stable. Mild scarring is seen in both lung bases. No evidence of acute infiltrate or pulmonary edema. Stable mild thoracic dextroscoliosis. IMPRESSION: Free intraperitoneal air. This may be due to peritoneal dialysis, although perforated viscus cannot be excluded. Recommend clinical correlation, and consider abdomen pelvis CT if warranted. Mild cardiomegaly and bibasilar scarring. No active lung disease. Critical Value/emergent results were called by telephone at the time of interpretation on 10/13/2021 at 12:24 pm to provider [facility] , who verbally acknowledged these results. Electronically Signed   By: Marlaine Hind M.D.   On: 10/13/2021 12:28     Medications:     atorvastatin  10 mg Oral QPM   heparin  5,000 Units Subcutaneous Q8H   hydrALAZINE  100 mg Oral Q8H   insulin glargine-yfgn  20 Units Subcutaneous QHS   [START ON 10/14/2021] irbesartan  150 mg Oral Daily   isosorbide mononitrate  30 mg Oral Daily   [START ON 10/14/2021] magnesium oxide  200 mg Oral Daily   pantoprazole  40 mg Oral Daily   senna  2 tablet Oral Daily   [START ON 10/14/2021] spironolactone  25 mg Oral Daily   acetaminophen **OR** acetaminophen, ondansetron **OR** ondansetron (ZOFRAN) IV  Assessment/ Plan:  Catherine Burke is a 81 y.o. white female with end stage renal disease on peritoneal dialysis, diabetes mellitus type II insulin dependent, hypertension, and hyperlipidemia who is admitted to [facility] on 10/13/2021 for Pneumoperitoneum [K66.8] Abdominal pain [R10.9]  [facility]  Peritoneal Dialysis [facility] 59kg CCPD 5 exchanges 2 liter fills 9 hours  End Stage Renal Disease: peritoneal dialysis last night. Cell count not consistent with peritonitis.  Patient has been getting negative ultrafiltration at home. Will hold dialysis tonight.   Hypertension: 175/67. Resume home medications   Hypokalemia: continue spironolactone and irbesartan.   Anemia of chronic kidney disease: Hemoglobin 9.3. Mircera as outpatient.    LOS: 0 Torrance Stockley 2/9/20237:17 PM

## 2021-10-14 ENCOUNTER — Observation Stay: Payer: Medicare PPO

## 2021-10-14 DIAGNOSIS — N186 End stage renal disease: Secondary | ICD-10-CM | POA: Diagnosis not present

## 2021-10-14 DIAGNOSIS — Z992 Dependence on renal dialysis: Secondary | ICD-10-CM | POA: Diagnosis not present

## 2021-10-14 DIAGNOSIS — R1013 Epigastric pain: Secondary | ICD-10-CM | POA: Diagnosis not present

## 2021-10-14 DIAGNOSIS — R109 Unspecified abdominal pain: Secondary | ICD-10-CM

## 2021-10-14 DIAGNOSIS — K668 Other specified disorders of peritoneum: Secondary | ICD-10-CM | POA: Diagnosis not present

## 2021-10-14 LAB — GLUCOSE, CAPILLARY
Glucose-Capillary: 56 mg/dL — ABNORMAL LOW (ref 70–99)
Glucose-Capillary: 105 mg/dL — ABNORMAL HIGH (ref 70–99)

## 2021-10-14 MED ORDER — PANTOPRAZOLE SODIUM 40 MG PO TBEC: 40.0000 mg | DELAYED_RELEASE_TABLET | Freq: Every day | ORAL | 0 refills | Status: AC

## 2021-10-14 MED ORDER — INSULIN ASPART 100 UNIT/ML IJ SOLN: 0.0000 [IU] | Freq: Every day | INTRAMUSCULAR | Status: DC

## 2021-10-14 MED ORDER — LANTUS SOLOSTAR 100 UNIT/ML ~~LOC~~ SOPN: 5.0000 [IU] | PEN_INJECTOR | Freq: Every day | SUBCUTANEOUS | Status: AC

## 2021-10-14 MED ORDER — INSULIN ASPART 100 UNIT/ML IJ SOLN: 0.0000 [IU] | Freq: Three times a day (TID) | INTRAMUSCULAR | Status: DC

## 2021-10-14 MED ORDER — ONDANSETRON HCL 4 MG PO TABS
4.0000 mg | ORAL_TABLET | Freq: Four times a day (QID) | ORAL | 0 refills | Status: AC | PRN
Start: 2021-10-14 — End: ?

## 2021-10-14 NOTE — Progress Notes (Signed)
[facility]  ROUNDING NOTE   Subjective:   [name] was admitted to [facility] on 10/13/2021 for Pneumoperitoneum [K66.8] Abdominal pain [R10.9]  Patient sitting at side of bed Eating breakfast States she had good night's sleep Denies pain and discomfort   Objective:  Vital signs in last 24 hours:  Temp:  [97.4 F (36.3 C)-99.3 F (37.4 C)] 97.5 F (36.4 C) (02/10 0806) Pulse Rate:  [65-89] 89 (02/10 0806) Resp:  [16-18] 16 (02/10 0806) BP: (141-175)/(62-73) 150/66 (02/10 0806) SpO2:  [95 %-99 %] 97 % (02/10 0806)  Weight change:  Filed Weights   10/13/21 1137  Weight: 54 kg    Intake/Output: No intake/output data recorded.   Intake/Output this shift:  No intake/output data recorded.  Physical Exam: General: NAD  Head: Normocephalic, atraumatic. Moist oral mucosal membranes  Eyes: Anicteric  Lungs:  Clear to auscultation, normal effort  Heart: Regular rate and rhythm  Abdomen:  +epigastric tenderness  Extremities:  no peripheral edema.  Neurologic: Nonfocal, moving all four extremities  Skin: No lesions  Access: PD catheter    Basic Metabolic Panel: Recent Labs  Lab 10/13/21 1136 10/13/21 1651  NA 135  --   K 3.5  --   CL 98  --   CO2 27  --   GLUCOSE 211*  --   BUN 37*  --   CREATININE 5.76*  --   CALCIUM 8.1*  --   MG  --  1.8     Liver Function Tests: No results for input(s): AST, ALT, ALKPHOS, BILITOT, PROT, ALBUMIN in the last 168 hours. No results for input(s): LIPASE, AMYLASE in the last 168 hours. No results for input(s): AMMONIA in the last 168 hours.  CBC: Recent Labs  Lab 10/13/21 1136  WBC 6.7  HGB 9.3*  HCT 29.6*  MCV 88.9  PLT 211     Cardiac Enzymes: No results for input(s): CKTOTAL, CKMB, CKMBINDEX, TROPONINI in the last 168 hours.  BNP: Invalid input(s): POCBNP  CBG: Recent Labs  Lab 10/13/21 2102 10/14/21 0806 10/14/21 0834  GLUCAP 157* 53* 105*    Microbiology: Results for orders  placed or performed during the hospital encounter of 10/13/21  Body fluid culture w Gram Stain     Status: None (Preliminary result)   Collection Time: 10/13/21 12:41 PM   Specimen: Peritoneal Washings; Body Fluid  Result Value Ref Range Status   Specimen Description   Final    PERITONEAL Performed at [facility], [address] [place], [place] [address]    Special Requests   Final    PERITONEAL Performed at [facility], [address] [place], [place] [address]    Gram Stain   Final    NO SQUAMOUS EPITHELIAL CELLS SEEN FEW WBC SEEN NO ORGANISMS SEEN Performed at [facility], [place] [address] [place], [place] [address]    Culture PENDING  Incomplete   Report Status PENDING  Incomplete  Resp Panel by RT-PCR (Flu A&B, Covid) Nasopharyngeal Swab     Status: None   Collection Time: 10/13/21  3:23 PM   Specimen: Nasopharyngeal Swab; Nasopharyngeal(NP) swabs in vial transport medium  Result Value Ref Range Status   SARS Coronavirus 2 by RT PCR NEGATIVE NEGATIVE Final    Comment: (NOTE) SARS-CoV-2 target nucleic acids are NOT DETECTED.  The SARS-CoV-2 RNA is generally detectable in upper respiratory specimens during the acute phase of infection. The lowest concentration of SARS-CoV-2 viral copies this assay  can detect is 138 copies/mL. A negative result does not preclude SARS-Cov-2 infection and should not be used as the sole basis for treatment or other patient management decisions. A negative result may occur with  improper specimen collection/handling, submission of specimen other than nasopharyngeal swab, presence of viral mutation(s) within the areas targeted by this assay, and inadequate number of viral copies(<138 copies/mL). A negative result must be combined with clinical observations, patient history, and epidemiological information. The expected result is Negative.  Fact Sheet for Patients:   EntrepreneurPulse.com.au  Fact Sheet for Healthcare Providers:  IncredibleEmployment.be  This test is no t yet approved or cleared by the [place] FDA and  has been authorized for detection and/or diagnosis of SARS-CoV-2 by FDA under an Emergency Use Authorization (EUA). This EUA will remain  in effect (meaning this test can be used) for the duration of the COVID-19 declaration under Section 564(b)(1) of the Act, 21 U.S.C.section 360bbb-3(b)(1), unless the authorization is terminated  or revoked sooner.       Influenza A by PCR NEGATIVE NEGATIVE Final   Influenza B by PCR NEGATIVE NEGATIVE Final    Comment: (NOTE) The Xpert Xpress SARS-CoV-2/FLU/RSV plus assay is intended as an aid in the diagnosis of influenza from Nasopharyngeal swab specimens and should not be used as a sole basis for treatment. Nasal washings and aspirates are unacceptable for Xpert Xpress SARS-CoV-2/FLU/RSV testing.  Fact Sheet for Patients: EntrepreneurPulse.com.au  Fact Sheet for Healthcare Providers: IncredibleEmployment.be  This test is not yet approved or cleared by the [place] FDA and has been authorized for detection and/or diagnosis of SARS-CoV-2 by FDA under an Emergency Use Authorization (EUA). This EUA will remain in effect (meaning this test can be used) for the duration of the COVID-19 declaration under Section 564(b)(1) of the Act, 21 U.S.C. section 360bbb-3(b)(1), unless the authorization is terminated or revoked.  Performed at [facility], [address] [place], [place] [address]     Coagulation Studies: No results for input(s): LABPROT, INR in the last 72 hours.  Urinalysis: No results for input(s): COLORURINE, LABSPEC, PHURINE, GLUCOSEU, HGBUR, BILIRUBINUR, KETONESUR, PROTEINUR, UROBILINOGEN, NITRITE, LEUKOCYTESUR in the last 72 hours.  Invalid input(s): APPERANCEUR    Imaging: CT  ABDOMEN PELVIS WO CONTRAST  Result Date: 10/13/2021 CLINICAL DATA:  Epigastric abdominal pain. EXAM: CT ABDOMEN AND PELVIS WITHOUT CONTRAST TECHNIQUE: Multidetector CT imaging of the abdomen and pelvis was performed following the standard protocol without IV contrast. RADIATION DOSE REDUCTION: This exam was performed according to the departmental dose-optimization program which includes automated exposure control, adjustment of the mA and/or kV according to patient size and/or use of iterative reconstruction technique. COMPARISON:  Radiograph of same day. FINDINGS: Lower chest: Small bilateral pleural effusions are noted with minimal adjacent subsegmental atelectasis. Hepatobiliary: Status post cholecystectomy. No biliary dilatation is noted. Slightly nodular hepatic contours are noted suggesting possible hepatic cirrhosis. Pancreas: Unremarkable. No pancreatic ductal dilatation or surrounding inflammatory changes. Spleen: Normal in size without focal abnormality. Adrenals/Urinary Tract: Adrenal glands appear normal. Left renal atrophy is noted. Right renal cysts are noted. No hydronephrosis or renal obstruction is noted. Urinary bladder is unremarkable. 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 Stomach/Bowel: Stomach is within normal limits. Appendix appears normal. No evidence of bowel wall thickening, distention, or inflammatory changes. Vascular/Lymphatic: Aortic atherosclerosis. No enlarged abdominal or pelvic lymph nodes. Reproductive: Status post hysterectomy. No adnexal masses. Other: Peritoneal dialysis catheter is noted with coiled tip seen in the pelvis. Small amount of free  fluid is noted in the pelvis. There is also noted moderate pneumoperitoneum in the epigastric region as noted on prior radiograph of same day. This could be related to the presence of the dialysis catheter. Musculoskeletal: No acute or significant osseous findings. IMPRESSION: Moderate pneumoperitoneum is noted in the  epigastric region as noted on radiograph of same day. This most likely is due to the presence of peritoneal dialysis catheter. The possibility of rupture of hollow viscus cannot be excluded, although there are no other signs to suggest bowel perforation on this study. These results were called by telephone at the time of interpretation on 10/13/2021 at 2:24 pm to provider [facility] , who verbally acknowledged these results. 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 Small bilateral pleural effusions with adjacent subsegmental atelectasis. Aortic Atherosclerosis (ICD10-I70.0). Electronically Signed   By: Marijo Conception M.D.   On: 10/13/2021 14:25   DG Chest 2 View  Result Date: 10/13/2021 CLINICAL DATA:  Chest pain beginning yesterday. Shortness of breath. Nausea. EXAM: CHEST - 2 VIEW COMPARISON:  04/25/2021 FINDINGS: Free intraperitoneal air is seen beneath both hemidiaphragms. Mild cardiomegaly stable. Mild scarring is seen in both lung bases. No evidence of acute infiltrate or pulmonary edema. Stable mild thoracic dextroscoliosis. IMPRESSION: Free intraperitoneal air. This may be due to peritoneal dialysis, although perforated viscus cannot be excluded. Recommend clinical correlation, and consider abdomen pelvis CT if warranted. Mild cardiomegaly and bibasilar scarring. No active lung disease. Critical Value/emergent results were called by telephone at the time of interpretation on 10/13/2021 at 12:24 pm to provider [facility] , who verbally acknowledged these results. Electronically Signed   By: Marlaine Hind M.D.   On: 10/13/2021 12:28   CT Angio Chest Pulmonary Embolism (PE) W or WO Contrast  Result Date: 10/13/2021 CLINICAL DATA:  Generalized chest pain. Shortness of breath. Nausea. EXAM: CT ANGIOGRAPHY CHEST WITH CONTRAST TECHNIQUE: Multidetector CT imaging of the chest was performed using the standard protocol during bolus administration of intravenous contrast.  Multiplanar CT image reconstructions and MIPs were obtained to evaluate the vascular anatomy. RADIATION DOSE REDUCTION: This exam was performed according to the departmental dose-optimization program which includes automated exposure control, adjustment of the mA and/or kV according to patient size and/or use of iterative reconstruction technique. CONTRAST:  42mL OMNIPAQUE IOHEXOL 350 MG/ML SOLN COMPARISON:  Chest radiograph and abdominal CT of earlier today. Chest CT of 02/11/2019 FINDINGS: Cardiovascular: The quality of this exam for evaluation of pulmonary embolism is moderate. The bolus is relatively well timed. There is mild motion degradation inferiorly. No pulmonary embolism to the large segmental level. Aortic atherosclerosis. Mild cardiomegaly, without pericardial effusion. Mediastinum/Nodes: Multiple small bilateral thyroid nodules including at up to 1.4 cm. Not clinically significant; no follow-up imaging recommended (ref: J Am Coll Radiol. 2015 Feb;12(2): 143-50) No mediastinal or hilar adenopathy. Small hiatal hernia. Lungs/Pleura: Trace bilateral pleural effusions. Pleural-based right upper and right middle lobe pulmonary nodules of maximally 4 mm are similar and likely subpleural lymph nodes. Mild dependent bibasilar atelectasis. Upper Abdomen: Free intraperitoneal air, as detailed on today's dedicated abdominal CT. Other abdominal findings deferred to that exam. Musculoskeletal: Osteopenia. Review of the MIP images confirms the above findings. IMPRESSION: 1. No pulmonary embolism with limitations above. 2.  No acute process in the chest. 3. Free intraperitoneal air, as on today's abdominal CT. 4. Small bilateral pleural effusions. 5.  Aortic Atherosclerosis (ICD10-I70.0). 6. Small hiatal hernia. Electronically Signed   By: Adria Devon.D.  On: 10/13/2021 20:10   DG ABD ACUTE 2+V W 1V CHEST  Result Date: 10/14/2021 CLINICAL DATA:  Abdominal pain EXAM: DG ABDOMEN ACUTE WITH 1 VIEW CHEST  COMPARISON:  10/13/2021 CT abdomen/pelvis FINDINGS: Stable cardiomediastinal silhouette with normal heart size. No pneumothorax. No pleural effusion. No overt pulmonary edema. Mild smooth is lateral basilar right pleural thickening, unchanged. Streaky linear mild bibasilar atelectasis is similar. No acute consolidative airspace disease. Free air under the right hemidiaphragm is slightly decreased. Previously visualized free air under the left hemidiaphragm has resolved. Peritoneal dialysis catheter terminates over the deep pelvis to the right of midline. No disproportionately dilated small bowel loops. Moderate gas and mild stool throughout the large bowel, most prominent in the sigmoid colon. Cholecystectomy clips are seen in the right upper quadrant of the abdomen. No radiopaque nephrolithiasis. IMPRESSION: 1. Decreased free air under the right hemidiaphragm, as noted on imaging from 1 day prior, favored to be due to peritoneal dialysis catheter. 2. Nonobstructive bowel gas pattern. 3. Moderate gas and mild stool throughout the large bowel, most prominent in the sigmoid colon. 4. Mild bibasilar atelectasis. No acute cardiopulmonary disease. Electronically Signed   By: Ilona Sorrel M.D.   On: 10/14/2021 08:33     Medications:     atorvastatin  10 mg Oral QPM   heparin  5,000 Units Subcutaneous Q8H   hydrALAZINE  100 mg Oral Q8H   insulin aspart  0-5 Units Subcutaneous QHS   insulin aspart  0-9 Units Subcutaneous TID WC   irbesartan  150 mg Oral Daily   isosorbide mononitrate  30 mg Oral Daily   magnesium oxide  200 mg Oral Daily   pantoprazole  40 mg Oral Daily   senna  2 tablet Oral Daily   spironolactone  25 mg Oral Daily   acetaminophen **OR** acetaminophen, ondansetron **OR** ondansetron (ZOFRAN) IV  Assessment/ Plan:  [name] is a 81 y.o. white female with end stage renal disease on peritoneal dialysis, diabetes mellitus type II insulin dependent, hypertension, and  hyperlipidemia who is admitted to [facility] on 10/13/2021 for Pneumoperitoneum [K66.8] Abdominal pain [R10.9]  [facility]itoneal [facility] 59kg CCPD 5 exchanges 2 liter fills 9 hours  End Stage Renal Disease: peritoneal dialysis last night. Cell count not consistent with peritonitis.  Dialysis held overnight, denies pain and discomfort Patient instructed to resume treatments tonight and contact clinic with Treatment tolerance.  Hypertension: 150/66 Resume home medications   Hypokalemia: continue spironolactone and irbesartan.   Anemia of chronic kidney disease: Hemoglobin 9.3. Mircera as outpatient.    LOS: 0 Ulysess Witz 2/10/20231:28 PM

## 2021-10-14 NOTE — TOC CM/SW Note (Signed)
Patient has orders to discharge home today. Chart reviewed. PCP is Jeffrey Sparks, MD. On room air. No wounds. No TOC needs identified. CSW signing off.  Breyonna Nault, CSW [phone]  

## 2021-10-14 NOTE — Progress Notes (Signed)
[facility] SURGICAL PROGRESS NOTE (cpt [phone])  Hospital Day(s): 0.  Interval History: Patient seen and examined, no acute events or new complaints overnight. Patient reports she is doing fine this morning. She endorses feeling "breathy" when standing for CXR. No CP< SOB, fever, chills, abdominal pain, nausea, emesis.. CXR this morning still shows small amount of pneumoperitoneum but this appears improved compared to yesterday's CXR.   Review of Systems:  Constitutional: denies fever, chills  HEENT: denies cough or congestion  Respiratory: denies any shortness of breath  Cardiovascular: denies chest pain or palpitations  Gastrointestinal: denies abdominal pain, N/V Genitourinary: denies burning with urination or urinary frequency Musculoskeletal: denies pain, decreased motor or sensation  Vital signs in last 24 hours: [min-max] current  Temp:  [97.4 F (36.3 C)-99.3 F (37.4 C)] 97.7 F (36.5 C) (02/10 0441) Pulse Rate:  [65-88] 86 (02/10 0441) Resp:  [16-20] 18 (02/10 0441) BP: (141-175)/(62-74) 141/73 (02/10 0441) SpO2:  [93 %-99 %] 96 % (02/10 0441) Weight:  [54 kg] 54 kg (02/09 1137)     Height: 5\' 4"  (162.6 cm) Weight: 54 kg BMI (Calculated): 20.42   Intake/Output last 2 shifts:  No intake/output data recorded.   Physical Exam:  Constitutional: alert, cooperative and no distress  HENT: normocephalic without obvious abnormality  Eyes: PERRL, EOM's grossly intact and symmetric  Respiratory: breathing non-labored at rest  Cardiovascular: regular rate and sinus rhythm  Gastrointestinal: soft, non-tender, and non-distended, no rebound/guarding. PD catheter present, site CDI Musculoskeletal: UE and LE FROM, no edema or wounds, motor and sensation grossly intact, NT    Labs:  CBC Latest Ref Rng & Units 10/13/2021 07/22/2021 07/21/2021  WBC 4.0 - 10.5 K/uL 6.7 4.1 3.3(L)  Hemoglobin 12.0 - 15.0 g/dL 9.3(L) 7.5(L) 7.7(L)  Hematocrit 36.0 - 46.0 % 29.6(L) 25.5(L)  24.3(L)  Platelets 150 - 400 K/uL 211 260 219   CMP Latest Ref Rng & Units 10/13/2021 07/22/2021 07/21/2021  Glucose 70 - 99 mg/dL 211(H) 194(H) 133(H)  BUN 8 - 23 mg/dL 37(H) 72(H) 60(H)  Creatinine 0.44 - 1.00 mg/dL 5.76(H) 9.03(H) 8.98(H)  Sodium 135 - 145 mmol/L 135 136 137  Potassium 3.5 - 5.1 mmol/L 3.5 5.0 3.7  Chloride 98 - 111 mmol/L 98 102 99  CO2 22 - 32 mmol/L 27 19(L) 24  Calcium 8.9 - 10.3 mg/dL 8.1(L) 7.9(L) 7.9(L)  Total Protein 6.5 - 8.1 g/dL - - 6.1(L)  Total Bilirubin 0.3 - 1.2 mg/dL - - 0.7  Alkaline Phos 38 - 126 U/L - - 52  AST 15 - 41 U/L - - 16  ALT 0 - 44 U/L - - 11     Imaging studies:   CXR + KUB (10/14/2021) personally reviewed still with small amount of pneumoperitoneum but this is improved compared to previous CXR, and radiologist report pending   Assessment/Plan: (ICD-10's: R10.13) 81 y.o. female presenting with chest pain and SOB x24 hours incidentally found to have pneumoperitoneum, which I suspect is secondary to accidentally instilling air the night prior while running PD, which could be irritating diaphragm and causing her symptoms. Hollow viscus injury is certainly a possibility, but her examination is without evidence of peritonitis, there are no other suspicious findings on CT, and her labs are reassuring.               - Suspect pneumoperitoneum is secondary to PD catheter as explained above. Extremely low likelihood for hollow viscus injury. No need for emergent surgical intervention             -  Okay for diet             - Okay to access and use PD catheter from surgical perspective             - Pain control prn  - Encouraged mobilization             - Further management per primary service; we will sign off    - Discharge Planning: Nothing further to add from surgical perspective; she can DC home once medically cleared. She does not need to follow up with general surgery. We will be available.    All of the above findings and  recommendations were discussed with the patient, and all of patient's questions were answered to her expressed satisfaction.   -- Edison Simon, PA-C [facility] 10/14/2021, 7:44 AM [phone] M-F: 7am - 4pm

## 2021-10-14 NOTE — Discharge Summary (Signed)
Physician Discharge Summary   Patient: Catherine Burke MRN: [id number] DOB: Aug 29, 1941  Admit date:     10/13/2021  Discharge date: 10/14/21  Discharge Physician: Aline August   PCP: Idelle Crouch, MD   Recommendations at discharge:   Follow-up with PCP within a week Outpatient follow-up with nephrology Follow-up in the ED if symptoms worsen or new.  Brief narrative: 81 y.o. female with medical history significant of ESRD on PD, type 2 diabetes mellitus, hypertension, GERD, arthritis, hyperlipidemia presented with abdominal/chest pain.  On presentation, chest x-ray showed possible free intraperitoneal air but no infiltrates.  CT of the abdomen showed moderate pneumoperitoneum, possibly reported to peritoneal dialysis catheter, no clear signs of intra-abdominal viscus perforation.  General surgery evaluated the patient and recommended conservative management with no need for surgical intervention.  Nephrology was consulted.  PD catheter fluid did not show signs of infection.  Subsequently, pain has improved.  Chest x-ray from this morning showed small amount of pneumoperitoneum but improved compared to yesterday's chest x-ray.  General surgery and nephrology have cleared the patient for discharge.  She will be discharged home today with outpatient follow-up with PCP and nephrology.  Discharge diagnosis and Hospital Course:  Abdominal/chest pain most likely secondary to pneumoperitoneum -Pneumoperitoneum possible from peritoneal dialysis catheter.  Troponins did not trend upwards.  CTA was negative for PE. -General surgery evaluated the patient and recommended conservative management with no need for surgical intervention.  -PD catheter fluid did not show signs of infection.  Subsequently, pain has improved.  Chest x-ray from this morning showed small amount of pneumoperitoneum but improved compared to yesterday's chest x-ray.  General surgery and nephrology have cleared the patient for  discharge.  End-stage renal disease on PD -Nephrology evaluation appreciated.  Continue PD at home.  Outpatient follow-up with nephrology.  Diabetes mellitus type 2 with hypoglycemia -Decrease long-acting insulin to 5 units daily on discharge.  Carb modified diet.  Outpatient follow-up  Essential hypertension -Blood pressure on the higher side.  Resume home medications.  Outpatient follow-up  Hyperlipidemia We will continue statin     Consultants: Nephrology Procedures performed: None Disposition: Home Diet recommendation:  Discharge Diet Orders (From admission, onward)     Start     Ordered   10/14/21 0000  Diet - low sodium heart healthy        10/14/21 1033   10/14/21 0000  Diet Carb Modified        10/14/21 1033           Discharge diet: Carb modified/renal diet with fluid restriction of up to 1200 cc a day  DISCHARGE MEDICATION: Allergies as of 10/14/2021       Reactions   Levofloxacin Other (See Comments)   Throat Swelling    Penicillins Anaphylaxis   Other    Penicillin G Other (See Comments)   Sulfa Antibiotics         Medication List     STOP taking these medications    amLODipine 2.5 MG tablet Commonly known as: NORVASC   APAP-MAG SALICYLATE-CAFFEINE PO   cetirizine 10 MG tablet Commonly known as: ZYRTEC   omeprazole 40 MG capsule Commonly known as: PRILOSEC Replaced by: pantoprazole 40 MG tablet   potassium chloride 10 MEQ tablet Commonly known as: KLOR-CON       TAKE these medications    Accu-Chek Aviva Plus test strip Generic drug: glucose blood TESTING TWICE DAILY.   atorvastatin 10 MG tablet Commonly known as: LIPITOR Take 10  mg by mouth daily.   cloNIDine 0.1 MG tablet Commonly known as: CATAPRES Take by mouth.   Dialyvite 5000 5 MG Tabs Take 1 tablet by mouth daily.   hydrALAZINE 100 MG tablet Commonly known as: APRESOLINE Take 1 tablet (100 mg total) by mouth every 8 (eight) hours.   Insulin Pen Needle 31G  X 5 MM Misc As directed   isosorbide mononitrate 30 MG 24 hr tablet Commonly known as: IMDUR Take 30 mg by mouth daily.   Lantus SoloStar 100 UNIT/ML Solostar Pen Generic drug: insulin glargine Inject 5 Units into the skin at bedtime. What changed: how much to take   Magnesium 400 MG Caps Take 1 tablet by mouth daily.   ondansetron 4 MG tablet Commonly known as: ZOFRAN Take 1 tablet (4 mg total) by mouth every 6 (six) hours as needed for nausea.   pantoprazole 40 MG tablet Commonly known as: PROTONIX Take 1 tablet (40 mg total) by mouth daily. Replaces: omeprazole 40 MG capsule   senna 8.6 MG tablet Commonly known as: SENOKOT Take 2 tablets by mouth daily.   spironolactone 25 MG tablet Commonly known as: ALDACTONE Take 1 tablet (25 mg total) by mouth daily.   telmisartan 40 MG tablet Commonly known as: MICARDIS Take by mouth.   torsemide 100 MG tablet Commonly known as: DEMADEX Take 100 mg by mouth daily as needed.   Tylenol 325 MG Caps Generic drug: Acetaminophen Take 1 capsule by mouth daily as needed (pain).   Vitamin D (Ergocalciferol) 1.25 MG (50000 UNIT) Caps capsule Commonly known as: DRISDOL Take 50,000 Units by mouth once a week.        Follow-up Information     Idelle Crouch, MD. Schedule an appointment as soon as possible for a visit in 1 week(s).   Specialty: Internal Medicine Contact information: [place] [address] [phone]                Subjective: Patient seen and examined at bedside.  Feels much better and thinks that she is ready to go home today.  Feels slightly winded on exertion.  Denies any overnight fever, nausea or vomiting.  Abdominal pain has much improved.  Discharge Exam: Filed Weights   10/13/21 1137  Weight: 54 kg   General: No acute distress, currently on room air.  Elderly female lying in bed. respiratory: Bilateral decreased breath sounds at bases with  some scattered crackles CVS: S1-S2 heard, rate controlled Abdominal: Soft, nontender, slightly distended, no organomegaly, bowel sounds heard Extremities: No cyanosis, clubbing; trace lower extremity edema  CNS: Alert, awake and oriented.  No focal neurologic deficit.  Moving extremities.   Condition at discharge: fair  The results of significant diagnostics from this hospitalization (including imaging, microbiology, ancillary and laboratory) are listed below for reference.   Imaging Studies: CT ABDOMEN PELVIS WO CONTRAST  Result Date: 10/13/2021 CLINICAL DATA:  Epigastric abdominal pain. EXAM: CT ABDOMEN AND PELVIS WITHOUT CONTRAST TECHNIQUE: Multidetector CT imaging of the abdomen and pelvis was performed following the standard protocol without IV contrast. RADIATION DOSE REDUCTION: This exam was performed according to the departmental dose-optimization program which includes automated exposure control, adjustment of the mA and/or kV according to patient size and/or use of iterative reconstruction technique. COMPARISON:  Radiograph of same day. FINDINGS: Lower chest: Small bilateral pleural effusions are noted with minimal adjacent subsegmental atelectasis. Hepatobiliary: Status post cholecystectomy. No biliary dilatation is noted. Slightly nodular hepatic contours are noted  suggesting possible hepatic cirrhosis. Pancreas: Unremarkable. No pancreatic ductal dilatation or surrounding inflammatory changes. Spleen: Normal in size without focal abnormality. Adrenals/Urinary Tract: Adrenal glands appear normal. Left renal atrophy is noted. Right renal cysts are noted. No hydronephrosis or renal obstruction is noted. Urinary bladder is unremarkable. 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 Stomach/Bowel: Stomach is within normal limits. Appendix appears normal. No evidence of bowel wall thickening, distention, or inflammatory changes. Vascular/Lymphatic: Aortic atherosclerosis. No enlarged  abdominal or pelvic lymph nodes. Reproductive: Status post hysterectomy. No adnexal masses. Other: Peritoneal dialysis catheter is noted with coiled tip seen in the pelvis. Small amount of free fluid is noted in the pelvis. There is also noted moderate pneumoperitoneum in the epigastric region as noted on prior radiograph of same day. This could be related to the presence of the dialysis catheter. Musculoskeletal: No acute or significant osseous findings. IMPRESSION: Moderate pneumoperitoneum is noted in the epigastric region as noted on radiograph of same day. This most likely is due to the presence of peritoneal dialysis catheter. The possibility of rupture of hollow viscus cannot be excluded, although there are no other signs to suggest bowel perforation on this study. These results were called by telephone at the time of interpretation on 10/13/2021 at 2:24 pm to provider [facility] , who verbally acknowledged these results. 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 Small bilateral pleural effusions with adjacent subsegmental atelectasis. Aortic Atherosclerosis (ICD10-I70.0). Electronically Signed   By: Marijo Conception M.D.   On: 10/13/2021 14:25   DG Chest 2 View  Result Date: 10/13/2021 CLINICAL DATA:  Chest pain beginning yesterday. Shortness of breath. Nausea. EXAM: CHEST - 2 VIEW COMPARISON:  04/25/2021 FINDINGS: Free intraperitoneal air is seen beneath both hemidiaphragms. Mild cardiomegaly stable. Mild scarring is seen in both lung bases. No evidence of acute infiltrate or pulmonary edema. Stable mild thoracic dextroscoliosis. IMPRESSION: Free intraperitoneal air. This may be due to peritoneal dialysis, although perforated viscus cannot be excluded. Recommend clinical correlation, and consider abdomen pelvis CT if warranted. Mild cardiomegaly and bibasilar scarring. No active lung disease. Critical Value/emergent results were called by telephone at the time of  interpretation on 10/13/2021 at 12:24 pm to provider [facility] , who verbally acknowledged these results. Electronically Signed   By: Marlaine Hind M.D.   On: 10/13/2021 12:28   CT Angio Chest Pulmonary Embolism (PE) W or WO Contrast  Result Date: 10/13/2021 CLINICAL DATA:  Generalized chest pain. Shortness of breath. Nausea. EXAM: CT ANGIOGRAPHY CHEST WITH CONTRAST TECHNIQUE: Multidetector CT imaging of the chest was performed using the standard protocol during bolus administration of intravenous contrast. Multiplanar CT image reconstructions and MIPs were obtained to evaluate the vascular anatomy. RADIATION DOSE REDUCTION: This exam was performed according to the departmental dose-optimization program which includes automated exposure control, adjustment of the mA and/or kV according to patient size and/or use of iterative reconstruction technique. CONTRAST:  53mL OMNIPAQUE IOHEXOL 350 MG/ML SOLN COMPARISON:  Chest radiograph and abdominal CT of earlier today. Chest CT of 02/11/2019 FINDINGS: Cardiovascular: The quality of this exam for evaluation of pulmonary embolism is moderate. The bolus is relatively well timed. There is mild motion degradation inferiorly. No pulmonary embolism to the large segmental level. Aortic atherosclerosis. Mild cardiomegaly, without pericardial effusion. Mediastinum/Nodes: Multiple small bilateral thyroid nodules including at up to 1.4 cm. Not clinically significant; no follow-up imaging recommended (ref: J Am Coll Radiol. 2015 Feb;12(2): 143-50) No mediastinal or hilar adenopathy. Small hiatal  hernia. Lungs/Pleura: Trace bilateral pleural effusions. Pleural-based right upper and right middle lobe pulmonary nodules of maximally 4 mm are similar and likely subpleural lymph nodes. Mild dependent bibasilar atelectasis. Upper Abdomen: Free intraperitoneal air, as detailed on today's dedicated abdominal CT. Other abdominal findings deferred to that exam. Musculoskeletal: Osteopenia.  Review of the MIP images confirms the above findings. IMPRESSION: 1. No pulmonary embolism with limitations above. 2.  No acute process in the chest. 3. Free intraperitoneal air, as on today's abdominal CT. 4. Small bilateral pleural effusions. 5.  Aortic Atherosclerosis (ICD10-I70.0). 6. Small hiatal hernia. Electronically Signed   By: Abigail Miyamoto M.D.   On: 10/13/2021 20:10   DG ABD ACUTE 2+V W 1V CHEST  Result Date: 10/14/2021 CLINICAL DATA:  Abdominal pain EXAM: DG ABDOMEN ACUTE WITH 1 VIEW CHEST COMPARISON:  10/13/2021 CT abdomen/pelvis FINDINGS: Stable cardiomediastinal silhouette with normal heart size. No pneumothorax. No pleural effusion. No overt pulmonary edema. Mild smooth is lateral basilar right pleural thickening, unchanged. Streaky linear mild bibasilar atelectasis is similar. No acute consolidative airspace disease. Free air under the right hemidiaphragm is slightly decreased. Previously visualized free air under the left hemidiaphragm has resolved. Peritoneal dialysis catheter terminates over the deep pelvis to the right of midline. No disproportionately dilated small bowel loops. Moderate gas and mild stool throughout the large bowel, most prominent in the sigmoid colon. Cholecystectomy clips are seen in the right upper quadrant of the abdomen. No radiopaque nephrolithiasis. IMPRESSION: 1. Decreased free air under the right hemidiaphragm, as noted on imaging from 1 day prior, favored to be due to peritoneal dialysis catheter. 2. Nonobstructive bowel gas pattern. 3. Moderate gas and mild stool throughout the large bowel, most prominent in the sigmoid colon. 4. Mild bibasilar atelectasis. No acute cardiopulmonary disease. Electronically Signed   By: Ilona Sorrel M.D.   On: 10/14/2021 08:33    Microbiology: Results for orders placed or performed during the hospital encounter of 10/13/21  Body fluid culture w Gram Stain     Status: None (Preliminary result)   Collection Time: 10/13/21 12:41  PM   Specimen: Peritoneal Washings; Body Fluid  Result Value Ref Range Status   Specimen Description   Final    PERITONEAL Performed at [facility], [address], [place], [place] [address]    Special Requests   Final    PERITONEAL Performed at [facility], [address] [place], [place] [address]    Gram Stain   Final    NO SQUAMOUS EPITHELIAL CELLS SEEN FEW WBC SEEN NO ORGANISMS SEEN Performed at [facility], [place] [address] [place], [place] [address]    Culture PENDING  Incomplete   Report Status PENDING  Incomplete  Resp Panel by RT-PCR (Flu A&B, Covid) Nasopharyngeal Swab     Status: None   Collection Time: 10/13/21  3:23 PM   Specimen: Nasopharyngeal Swab; Nasopharyngeal(NP) swabs in vial transport medium  Result Value Ref Range Status   SARS Coronavirus 2 by RT PCR NEGATIVE NEGATIVE Final    Comment: (NOTE) SARS-CoV-2 target nucleic acids are NOT DETECTED.  The SARS-CoV-2 RNA is generally detectable in upper respiratory specimens during the acute phase of infection. The lowest concentration of SARS-CoV-2 viral copies this assay can detect is 138 copies/mL. A negative result does not preclude SARS-Cov-2 infection and should not be used as the sole basis for treatment or other patient management decisions. A negative result may occur with  improper specimen collection/handling, submission of specimen other  than nasopharyngeal swab, presence of viral mutation(s) within the areas targeted by this assay, and inadequate number of viral copies(<138 copies/mL). A negative result must be combined with clinical observations, patient history, and epidemiological information. The expected result is Negative.  Fact Sheet for Patients:  EntrepreneurPulse.com.au  Fact Sheet for Healthcare Providers:  IncredibleEmployment.be  This test is no t yet approved or cleared by the [place] FDA and  has been  authorized for detection and/or diagnosis of SARS-CoV-2 by FDA under an Emergency Use Authorization (EUA). This EUA will remain  in effect (meaning this test can be used) for the duration of the COVID-19 declaration under Section 564(b)(1) of the Act, 21 U.S.C.section 360bbb-3(b)(1), unless the authorization is terminated  or revoked sooner.       Influenza A by PCR NEGATIVE NEGATIVE Final   Influenza B by PCR NEGATIVE NEGATIVE Final    Comment: (NOTE) The Xpert Xpress SARS-CoV-2/FLU/RSV plus assay is intended as an aid in the diagnosis of influenza from Nasopharyngeal swab specimens and should not be used as a sole basis for treatment. Nasal washings and aspirates are unacceptable for Xpert Xpress SARS-CoV-2/FLU/RSV testing.  Fact Sheet for Patients: EntrepreneurPulse.com.au  Fact Sheet for Healthcare Providers: IncredibleEmployment.be  This test is not yet approved or cleared by the [place] FDA and has been authorized for detection and/or diagnosis of SARS-CoV-2 by FDA under an Emergency Use Authorization (EUA). This EUA will remain in effect (meaning this test can be used) for the duration of the COVID-19 declaration under Section 564(b)(1) of the Act, 21 U.S.C. section 360bbb-3(b)(1), unless the authorization is terminated or revoked.  Performed at [facility], [address] [place], [place] [address]     Labs: CBC: Recent Labs  Lab 10/13/21 1136  WBC 6.7  HGB 9.3*  HCT 29.6*  MCV 88.9  PLT 096   Basic Metabolic Panel: Recent Labs  Lab 10/13/21 1136 10/13/21 1651  NA 135  --   K 3.5  --   CL 98  --   CO2 27  --   GLUCOSE 211*  --   BUN 37*  --   CREATININE 5.76*  --   CALCIUM 8.1*  --   MG  --  1.8   Liver Function Tests: No results for input(s): AST, ALT, ALKPHOS, BILITOT, PROT, ALBUMIN in the last 168 hours. CBG: Recent Labs  Lab 10/13/21 2102 10/14/21 0806 10/14/21 0834  GLUCAP 157* 56*  105*    Discharge time spent: greater than 30 minutes.  Signed: Aline August, MD [facility] 10/14/2021

## 2021-10-14 NOTE — Progress Notes (Signed)
Hypoglycemic Event  CBG: 56  Treatment:  8 oz of juice  Symptoms: Shaky  Follow-up CBG: Time: 8786 CBG Result: 105  Possible Reasons for Event: Inadequate meal intake  Comments/MD notified: Dr. Candelaria Stagers, Lester Clarendon

## 2021-10-14 NOTE — Progress Notes (Signed)
Inpatient Diabetes Program Recommendations  AACE/ADA: New Consensus Statement on Inpatient Glycemic Control   Target Ranges:  Prepandial:   less than 140 mg/dL      Peak postprandial:   less than 180 mg/dL (1-2 hours)      Critically ill patients:  140 - 180 mg/dL    Latest Reference Range & Units 10/13/21 21:02 10/14/21 08:06 10/14/21 08:34  Glucose-Capillary 70 - 99 mg/dL 157 (H) 56 (L) 105 (H)   Review of Glycemic Control  Diabetes history: DM2 Outpatient Diabetes medications: Lantus 10 units QHS Current orders for Inpatient glycemic control: Semglee 20 units QHS, Novolog 0-9 units TID with meals, Novolog 0-5 units QHS  Inpatient Diabetes Program Recommendations:    Insulin: Please consider decreasing Semglee to 10 units QHS.  Thanks, Barnie Alderman, RN, MSN, CDE Diabetes Coordinator Inpatient Diabetes Program [phone] (Team Pager from 8am to 5pm)

## 2021-10-15 ENCOUNTER — Emergency Department (HOSPITAL_COMMUNITY)
Admission: EM | Admit: 2021-10-15 | Discharge: 2021-10-16 | Disposition: A | Discharge status: Home / Self Care | Payer: Medicare PPO | Attending: Emergency Medicine | Admitting: Emergency Medicine

## 2021-10-15 ENCOUNTER — Emergency Department (HOSPITAL_COMMUNITY): Payer: Medicare PPO

## 2021-10-15 ENCOUNTER — Encounter (HOSPITAL_COMMUNITY): Payer: Self-pay

## 2021-10-15 ENCOUNTER — Other Ambulatory Visit: Payer: Self-pay

## 2021-10-15 DIAGNOSIS — M25512 Pain in left shoulder: Secondary | ICD-10-CM | POA: Diagnosis not present

## 2021-10-15 DIAGNOSIS — J90 Pleural effusion, not elsewhere classified: Secondary | ICD-10-CM | POA: Diagnosis not present

## 2021-10-15 DIAGNOSIS — Z794 Long term (current) use of insulin: Secondary | ICD-10-CM | POA: Insufficient documentation

## 2021-10-15 DIAGNOSIS — Z79899 Other long term (current) drug therapy: Secondary | ICD-10-CM | POA: Insufficient documentation

## 2021-10-15 DIAGNOSIS — Z992 Dependence on renal dialysis: Secondary | ICD-10-CM | POA: Insufficient documentation

## 2021-10-15 DIAGNOSIS — M25511 Pain in right shoulder: Secondary | ICD-10-CM | POA: Insufficient documentation

## 2021-10-15 DIAGNOSIS — A419 Sepsis, unspecified organism: Secondary | ICD-10-CM

## 2021-10-15 DIAGNOSIS — R509 Fever, unspecified: Secondary | ICD-10-CM | POA: Insufficient documentation

## 2021-10-15 LAB — COMPREHENSIVE METABOLIC PANEL
ALT: 10 U/L (ref 0–44)
AST: 24 U/L (ref 15–41)
Albumin: 1.9 g/dL — ABNORMAL LOW (ref 3.5–5.0)
Alkaline Phosphatase: 65 U/L (ref 38–126)
Anion gap: 11 (ref 5–15)
BUN: 49 mg/dL — ABNORMAL HIGH (ref 8–23)
CO2: 24 mmol/L (ref 22–32)
Calcium: 7.3 mg/dL — ABNORMAL LOW (ref 8.9–10.3)
Chloride: 98 mmol/L (ref 98–111)
Creatinine, Ser: 6.62 mg/dL — ABNORMAL HIGH (ref 0.44–1.00)
GFR, Estimated: 6 mL/min — ABNORMAL LOW (ref >60)
Glucose, Bld: 180 mg/dL — ABNORMAL HIGH (ref 70–99)
Potassium: 3.7 mmol/L (ref 3.5–5.1)
Sodium: 133 mmol/L — ABNORMAL LOW (ref 135–145)
Total Bilirubin: 0.7 mg/dL (ref 0.3–1.2)
Total Protein: 6 g/dL — ABNORMAL LOW (ref 6.5–8.1)

## 2021-10-15 LAB — CBC WITH DIFFERENTIAL/PLATELET
Abs Immature Granulocytes: 0.05 10*3/uL (ref 0.00–0.07)
Basophils Absolute: 0 10*3/uL (ref 0.0–0.1)
Basophils Relative: 0 %
Eosinophils Absolute: 0 10*3/uL (ref 0.0–0.5)
Eosinophils Relative: 1 %
HCT: 26.4 % — ABNORMAL LOW (ref 36.0–46.0)
Hemoglobin: 8.2 g/dL — ABNORMAL LOW (ref 12.0–15.0)
Immature Granulocytes: 1 %
Lymphocytes Relative: 6 %
Lymphs Abs: 0.3 10*3/uL — ABNORMAL LOW (ref 0.7–4.0)
MCH: 27.9 pg (ref 26.0–34.0)
MCHC: 31.1 g/dL (ref 30.0–36.0)
MCV: 89.8 fL (ref 80.0–100.0)
Monocytes Absolute: 0.2 10*3/uL (ref 0.1–1.0)
Monocytes Relative: 4 %
Neutro Abs: 4.6 10*3/uL (ref 1.7–7.7)
Neutrophils Relative %: 88 %
Platelets: 246 10*3/uL (ref 150–400)
RBC: 2.94 MIL/uL — ABNORMAL LOW (ref 3.87–5.11)
RDW: 17.2 % — ABNORMAL HIGH (ref 11.5–15.5)
WBC: 5.2 10*3/uL (ref 4.0–10.5)
nRBC: 0 % (ref 0.0–0.2)

## 2021-10-15 LAB — PROTIME-INR
INR: 1.2 (ref 0.8–1.2)
Prothrombin Time: 15.5 seconds — ABNORMAL HIGH (ref 11.4–15.2)

## 2021-10-15 LAB — APTT: aPTT: 47 seconds — ABNORMAL HIGH (ref 24–36)

## 2021-10-15 LAB — LACTIC ACID, PLASMA: Lactic Acid, Venous: 1 mmol/L (ref 0.5–1.9)

## 2021-10-15 NOTE — ED Provider Notes (Signed)
[facility] EMERGENCY DEPARTMENT Provider Note   CSN: [id number] Arrival date & time: 10/15/21  2133     History  Chief Complaint  Patient presents with   Fever    Catherine Burke is a 81 y.o. female.  Patient presents to the emergency department for evaluation of shoulder pain.  Patient reports that both of her shoulders hurt and she cannot raise her arms because of the pain.  She denies any fall.  Patient found to have fever.  She reports that she was admitted to [facility] a couple of days ago for abdominal pain and discharged yesterday.  She is a peritoneal dialysis patient.  She is not experiencing any abdominal pain currently.      Home Medications Prior to Admission medications   Medication Sig Start Date End Date Taking? Authorizing Provider  ACCU-CHEK AVIVA PLUS test strip TESTING TWICE DAILY. 08/11/14   Susy Frizzle, MD  Acetaminophen (TYLENOL) 325 MG CAPS Take 1 capsule by mouth daily as needed (pain).    [provider]  atorvastatin (LIPITOR) 10 MG tablet Take 10 mg by mouth daily. 07/28/20   [provider]  B Complex-C-Biotin-E-Min-FA (DIALYVITE 5000) 5 MG TABS Take 1 tablet by mouth daily. 10/27/20   [provider]  cloNIDine (CATAPRES) 0.1 MG tablet Take by mouth. 07/19/20   [provider]  hydrALAZINE (APRESOLINE) 100 MG tablet Take 1 tablet (100 mg total) by mouth every 8 (eight) hours. 10/13/17 07/21/20  Henreitta Leber, MD  insulin glargine (LANTUS SOLOSTAR) 100 UNIT/ML Solostar Pen Inject 5 Units into the skin at bedtime. 10/14/21   Aline August, MD  Insulin Pen Needle 31G X 5 MM MISC As directed 03/31/15   Alycia Rossetti, MD  isosorbide mononitrate (IMDUR) 30 MG 24 hr tablet Take 30 mg by mouth daily. 08/12/20   [provider]  Magnesium 400 MG CAPS Take 1 tablet by mouth daily. 08/31/21   [provider]  ondansetron (ZOFRAN) 4 MG tablet Take 1 tablet (4 mg total) by mouth  every 6 (six) hours as needed for nausea. 10/14/21   Aline August, MD  pantoprazole (PROTONIX) 40 MG tablet Take 1 tablet (40 mg total) by mouth daily. 10/14/21   Aline August, MD  senna (SENOKOT) 8.6 MG tablet Take 2 tablets by mouth daily.    [provider]  spironolactone (ALDACTONE) 25 MG tablet Take 1 tablet (25 mg total) by mouth daily. 10/14/17 07/21/20  Henreitta Leber, MD  telmisartan (MICARDIS) 40 MG tablet Take by mouth. 06/21/21   [provider]  torsemide (DEMADEX) 100 MG tablet Take 100 mg by mouth daily as needed. 04/25/21   [provider]  Vitamin D, Ergocalciferol, (DRISDOL) 1.25 MG (50000 UNIT) CAPS capsule Take 50,000 Units by mouth once a week. 06/21/21   [provider]      Allergies    Levofloxacin, Penicillins, Other, Penicillin g, and Sulfa antibiotics    Review of Systems   Review of Systems  Constitutional:  Positive for fever.  Musculoskeletal:  Positive for arthralgias.   Physical Exam Updated Vital Signs BP (!) 169/77    Pulse 87    Temp 99.1 F (37.3 C)    Resp 20    Ht 5\' 4"  (1.626 m)    Wt 54 kg    SpO2 98%    BMI 20.43 kg/m  Physical Exam Vitals and nursing note reviewed.  Constitutional:      General: She  is not in acute distress.    Appearance: She is well-developed.  HENT:     Head: Normocephalic and atraumatic.     Mouth/Throat:     Mouth: Mucous membranes are moist.  Eyes:     General: Vision grossly intact. Gaze aligned appropriately.     Extraocular Movements: Extraocular movements intact.     Conjunctiva/sclera: Conjunctivae normal.  Cardiovascular:     Rate and Rhythm: Normal rate and regular rhythm.     Pulses: Normal pulses.     Heart sounds: Normal heart sounds, S1 normal and S2 normal. No murmur heard.   No friction rub. No gallop.  Pulmonary:     Effort: Pulmonary effort is normal. No respiratory distress.     Breath sounds: Normal breath sounds.  Abdominal:     General: Bowel sounds  are normal.     Palpations: Abdomen is soft.     Tenderness: There is no abdominal tenderness. There is no guarding or rebound.     Hernia: No hernia is present.  Musculoskeletal:        General: No swelling.     Right shoulder: Tenderness present. No swelling or deformity. Decreased range of motion.     Left shoulder: Tenderness present. No swelling or deformity. Decreased range of motion.     Cervical back: Full passive range of motion without pain, normal range of motion and neck supple. No spinous process tenderness or muscular tenderness. Normal range of motion.     Right lower leg: No edema.     Left lower leg: No edema.  Skin:    General: Skin is warm and dry.     Capillary Refill: Capillary refill takes less than 2 seconds.     Findings: No ecchymosis, erythema, rash or wound.  Neurological:     General: No focal deficit present.     Mental Status: She is alert and oriented to person, place, and time.     GCS: GCS eye subscore is 4. GCS verbal subscore is 5. GCS motor subscore is 6.     Cranial Nerves: Cranial nerves 2-12 are intact.     Sensory: Sensation is intact.     Motor: Motor function is intact.     Coordination: Coordination is intact.  Psychiatric:        Attention and Perception: Attention normal.        Mood and Affect: Mood normal.        Speech: Speech normal.        Behavior: Behavior normal.    ED Results / Procedures / Treatments   Labs (all labs ordered are listed, but only abnormal results are displayed) Labs Reviewed  COMPREHENSIVE METABOLIC PANEL - Abnormal; Notable for the following components:      Result Value   Sodium 133 (*)    Glucose, Bld 180 (*)    BUN 49 (*)    Creatinine, Ser 6.62 (*)    Calcium 7.3 (*)    Total Protein 6.0 (*)    Albumin 1.9 (*)    GFR, Estimated 6 (*)    All other components within normal limits  CBC WITH DIFFERENTIAL/PLATELET - Abnormal; Notable for the following components:   RBC 2.94 (*)    Hemoglobin 8.2 (*)     HCT 26.4 (*)    RDW 17.2 (*)    Lymphs Abs 0.3 (*)    All other components within normal limits  APTT - Abnormal; Notable for the following components:  aPTT 47 (*)    All other components within normal limits  PROTIME-INR - Abnormal; Notable for the following components:   Prothrombin Time 15.5 (*)    All other components within normal limits  CULTURE, BLOOD (ROUTINE X 2)  CULTURE, BLOOD (ROUTINE X 2)  LACTIC ACID, PLASMA  LACTIC ACID, PLASMA  URINALYSIS, ROUTINE W REFLEX MICROSCOPIC    EKG EKG Interpretation  Date/Time:  Saturday October 15 2021 21:45:21 EST Ventricular Rate:  100 PR Interval:  157 QRS Duration: 90 QT Interval:  353 QTC Calculation: 456 R Axis:   -37 Text Interpretation: Sinus or ectopic atrial tachycardia Left ventricular hypertrophy Anterior Q waves, possibly due to LVH Confirmed by Orpah Greek [id number]) on 10/06/2021 3:04:56 AM  Radiology DG Chest Port 1 View  Result Date: 10/15/2021 CLINICAL DATA:  Fever.  Possible sepsis. EXAM: PORTABLE CHEST 1 VIEW COMPARISON:  Chest radiograph dated 10/13/2021. FINDINGS: Minimal left lung base atelectasis similar or improved since the prior radiograph. Trace left pleural effusion. No new consolidation. No pneumothorax. The cardiac silhouette is within limits. Atherosclerotic calcification of the aorta. Osteopenia with degenerative changes of the spine. No acute osseous pathology. Minimal pneumoperitoneum along the right hemidiaphragm, decreased since the prior study. IMPRESSION: 1. Minimal left lung base atelectasis similar or improved since the prior radiograph. Trace left pleural effusion. 2. Minimal residual pneumoperitoneum under the right hemidiaphragm. Electronically Signed   By: Anner Crete M.D.   On: 10/15/2021 22:18   DG ABD ACUTE 2+V W 1V CHEST  Result Date: 10/14/2021 CLINICAL DATA:  Abdominal pain EXAM: DG ABDOMEN ACUTE WITH 1 VIEW CHEST COMPARISON:  10/13/2021 CT abdomen/pelvis FINDINGS:  Stable cardiomediastinal silhouette with normal heart size. No pneumothorax. No pleural effusion. No overt pulmonary edema. Mild smooth is lateral basilar right pleural thickening, unchanged. Streaky linear mild bibasilar atelectasis is similar. No acute consolidative airspace disease. Free air under the right hemidiaphragm is slightly decreased. Previously visualized free air under the left hemidiaphragm has resolved. Peritoneal dialysis catheter terminates over the deep pelvis to the right of midline. No disproportionately dilated small bowel loops. Moderate gas and mild stool throughout the large bowel, most prominent in the sigmoid colon. Cholecystectomy clips are seen in the right upper quadrant of the abdomen. No radiopaque nephrolithiasis. IMPRESSION: 1. Decreased free air under the right hemidiaphragm, as noted on imaging from 1 day prior, favored to be due to peritoneal dialysis catheter. 2. Nonobstructive bowel gas pattern. 3. Moderate gas and mild stool throughout the large bowel, most prominent in the sigmoid colon. 4. Mild bibasilar atelectasis. No acute cardiopulmonary disease. Electronically Signed   By: Ilona Sorrel M.D.   On: 10/14/2021 08:33    Procedures Procedures    Medications Ordered in ED Medications - No data to display  ED Course/ Medical Decision Making/ A&P                           Medical Decision Making Amount and/or Complexity of Data Reviewed Labs: ordered.   Patient presents to the emergency department for evaluation of bilateral shoulder pain.  Patient has not had any recent injury.  Examination reveals no swelling, erythema, warmth.  She has diffuse tenderness and decreased range of motion but no signs of infection.  Patient also had a low-grade fever earlier.  She was just hospitalized at [facility] and discharged yesterday.  During that time she tested negative for COVID.  She does do peritoneal dialysis.  Currently has  no abdominal discomfort.   Peritoneal dialysis fluid was cultured and is negative thus far.  Doubt peritonitis.  Chest x-ray does not show evidence of pneumonia.  Blood cultures from recent hospitalization are negative.  They have been repeated.  Lab work is reassuring.  No leukocytosis.  Normal lactic acid.  Vital signs are unremarkable.  At this point source of the fever is unclear but she does not have evidence of a bacterial infection requiring hospitalization.        Final Clinical Impression(s) / ED Diagnoses Final diagnoses:  Acute pain of both shoulders  Fever, unspecified fever cause    Rx / DC Orders ED Discharge Orders     None         Sharel Behne, Gwenyth Allegra, MD 10/15/2021 [phone]

## 2021-10-15 NOTE — ED Triage Notes (Addendum)
[facility] called out for fever and shoulder pain, pt from home, was admitted to [facility] on 10/13/21 and discharged home yesterday. CT scan was done there and showed pneumoperitoneum without signs of infection. Pt does peritoneal dialysis at home daily, has not had today. Pt is able to raise both arms, but reports pain in doing this. Pt also reports fever, per EMS pt temp was 101. Pt says she had ibuprofen for fever at home around 1900.

## 2021-10-16 ENCOUNTER — Encounter: Payer: Self-pay | Admitting: Internal Medicine

## 2021-10-16 ENCOUNTER — Encounter: Payer: Self-pay | Admitting: Emergency Medicine

## 2021-10-16 ENCOUNTER — Ambulatory Visit
Admission: EM | Admit: 2021-10-16 | Discharge: 2021-10-16 | Disposition: A | Discharge status: Home / Self Care | Payer: Medicare PPO | Attending: Family Medicine | Admitting: Family Medicine

## 2021-10-16 ENCOUNTER — Emergency Department: Payer: Medicare PPO

## 2021-10-16 ENCOUNTER — Inpatient Hospital Stay
Admission: EM | Admit: 2021-10-16 | Discharge: 2021-12-03 | DRG: 907 | Disposition: E | Payer: Medicare PPO | Attending: Internal Medicine | Admitting: Internal Medicine

## 2021-10-16 DIAGNOSIS — E11649 Type 2 diabetes mellitus with hypoglycemia without coma: Secondary | ICD-10-CM | POA: Diagnosis not present

## 2021-10-16 DIAGNOSIS — D329 Benign neoplasm of meninges, unspecified: Secondary | ICD-10-CM | POA: Diagnosis present

## 2021-10-16 DIAGNOSIS — T380X5A Adverse effect of glucocorticoids and synthetic analogues, initial encounter: Secondary | ICD-10-CM | POA: Diagnosis present

## 2021-10-16 DIAGNOSIS — R220 Localized swelling, mass and lump, head: Secondary | ICD-10-CM | POA: Diagnosis not present

## 2021-10-16 DIAGNOSIS — Z66 Do not resuscitate: Secondary | ICD-10-CM | POA: Diagnosis not present

## 2021-10-16 DIAGNOSIS — E1165 Type 2 diabetes mellitus with hyperglycemia: Secondary | ICD-10-CM | POA: Diagnosis present

## 2021-10-16 DIAGNOSIS — K1379 Other lesions of oral mucosa: Secondary | ICD-10-CM | POA: Diagnosis not present

## 2021-10-16 DIAGNOSIS — Z515 Encounter for palliative care: Secondary | ICD-10-CM

## 2021-10-16 DIAGNOSIS — E538 Deficiency of other specified B group vitamins: Secondary | ICD-10-CM | POA: Diagnosis present

## 2021-10-16 DIAGNOSIS — R0902 Hypoxemia: Secondary | ICD-10-CM

## 2021-10-16 DIAGNOSIS — Z833 Family history of diabetes mellitus: Secondary | ICD-10-CM

## 2021-10-16 DIAGNOSIS — T783XXA Angioneurotic edema, initial encounter: Secondary | ICD-10-CM | POA: Diagnosis not present

## 2021-10-16 DIAGNOSIS — Z794 Long term (current) use of insulin: Secondary | ICD-10-CM

## 2021-10-16 DIAGNOSIS — R52 Pain, unspecified: Secondary | ICD-10-CM

## 2021-10-16 DIAGNOSIS — Z881 Allergy status to other antibiotic agents status: Secondary | ICD-10-CM

## 2021-10-16 DIAGNOSIS — N184 Chronic kidney disease, stage 4 (severe): Secondary | ICD-10-CM | POA: Diagnosis present

## 2021-10-16 DIAGNOSIS — D696 Thrombocytopenia, unspecified: Secondary | ICD-10-CM

## 2021-10-16 DIAGNOSIS — E440 Moderate protein-calorie malnutrition: Secondary | ICD-10-CM | POA: Insufficient documentation

## 2021-10-16 DIAGNOSIS — Z87442 Personal history of urinary calculi: Secondary | ICD-10-CM

## 2021-10-16 DIAGNOSIS — R509 Fever, unspecified: Secondary | ICD-10-CM

## 2021-10-16 DIAGNOSIS — E785 Hyperlipidemia, unspecified: Secondary | ICD-10-CM | POA: Diagnosis present

## 2021-10-16 DIAGNOSIS — N2581 Secondary hyperparathyroidism of renal origin: Secondary | ICD-10-CM | POA: Diagnosis present

## 2021-10-16 DIAGNOSIS — K219 Gastro-esophageal reflux disease without esophagitis: Secondary | ICD-10-CM | POA: Diagnosis present

## 2021-10-16 DIAGNOSIS — Z79899 Other long term (current) drug therapy: Secondary | ICD-10-CM

## 2021-10-16 DIAGNOSIS — A690 Necrotizing ulcerative stomatitis: Secondary | ICD-10-CM | POA: Diagnosis not present

## 2021-10-16 DIAGNOSIS — Z992 Dependence on renal dialysis: Secondary | ICD-10-CM

## 2021-10-16 DIAGNOSIS — R600 Localized edema: Secondary | ICD-10-CM

## 2021-10-16 DIAGNOSIS — K140 Glossitis: Secondary | ICD-10-CM | POA: Diagnosis present

## 2021-10-16 DIAGNOSIS — G928 Other toxic encephalopathy: Secondary | ICD-10-CM | POA: Diagnosis not present

## 2021-10-16 DIAGNOSIS — E876 Hypokalemia: Secondary | ICD-10-CM | POA: Diagnosis not present

## 2021-10-16 DIAGNOSIS — D631 Anemia in chronic kidney disease: Secondary | ICD-10-CM

## 2021-10-16 DIAGNOSIS — I4891 Unspecified atrial fibrillation: Secondary | ICD-10-CM

## 2021-10-16 DIAGNOSIS — J9602 Acute respiratory failure with hypercapnia: Secondary | ICD-10-CM | POA: Diagnosis not present

## 2021-10-16 DIAGNOSIS — L03211 Cellulitis of face: Secondary | ICD-10-CM | POA: Diagnosis present

## 2021-10-16 DIAGNOSIS — Z882 Allergy status to sulfonamides status: Secondary | ICD-10-CM

## 2021-10-16 DIAGNOSIS — K148 Other diseases of tongue: Secondary | ICD-10-CM | POA: Diagnosis present

## 2021-10-16 DIAGNOSIS — Z6822 Body mass index (BMI) 22.0-22.9, adult: Secondary | ICD-10-CM

## 2021-10-16 DIAGNOSIS — Z01818 Encounter for other preprocedural examination: Secondary | ICD-10-CM

## 2021-10-16 DIAGNOSIS — M810 Age-related osteoporosis without current pathological fracture: Secondary | ICD-10-CM | POA: Diagnosis present

## 2021-10-16 DIAGNOSIS — J9601 Acute respiratory failure with hypoxia: Secondary | ICD-10-CM | POA: Diagnosis not present

## 2021-10-16 DIAGNOSIS — I959 Hypotension, unspecified: Secondary | ICD-10-CM | POA: Diagnosis not present

## 2021-10-16 DIAGNOSIS — E7800 Pure hypercholesterolemia, unspecified: Secondary | ICD-10-CM | POA: Diagnosis present

## 2021-10-16 DIAGNOSIS — I480 Paroxysmal atrial fibrillation: Secondary | ICD-10-CM | POA: Diagnosis present

## 2021-10-16 DIAGNOSIS — N186 End stage renal disease: Secondary | ICD-10-CM

## 2021-10-16 DIAGNOSIS — E639 Nutritional deficiency, unspecified: Secondary | ICD-10-CM

## 2021-10-16 DIAGNOSIS — Z8249 Family history of ischemic heart disease and other diseases of the circulatory system: Secondary | ICD-10-CM

## 2021-10-16 DIAGNOSIS — N179 Acute kidney failure, unspecified: Secondary | ICD-10-CM | POA: Diagnosis present

## 2021-10-16 DIAGNOSIS — T464X5A Adverse effect of angiotensin-converting-enzyme inhibitors, initial encounter: Secondary | ICD-10-CM | POA: Diagnosis present

## 2021-10-16 DIAGNOSIS — Z880 Allergy status to penicillin: Secondary | ICD-10-CM

## 2021-10-16 DIAGNOSIS — T783XXD Angioneurotic edema, subsequent encounter: Secondary | ICD-10-CM

## 2021-10-16 DIAGNOSIS — Z20822 Contact with and (suspected) exposure to covid-19: Secondary | ICD-10-CM | POA: Diagnosis present

## 2021-10-16 DIAGNOSIS — J9690 Respiratory failure, unspecified, unspecified whether with hypoxia or hypercapnia: Secondary | ICD-10-CM

## 2021-10-16 DIAGNOSIS — E871 Hypo-osmolality and hyponatremia: Secondary | ICD-10-CM | POA: Diagnosis present

## 2021-10-16 DIAGNOSIS — R1310 Dysphagia, unspecified: Secondary | ICD-10-CM | POA: Diagnosis present

## 2021-10-16 DIAGNOSIS — I120 Hypertensive chronic kidney disease with stage 5 chronic kidney disease or end stage renal disease: Secondary | ICD-10-CM | POA: Diagnosis present

## 2021-10-16 DIAGNOSIS — I10 Essential (primary) hypertension: Secondary | ICD-10-CM | POA: Diagnosis present

## 2021-10-16 DIAGNOSIS — E1122 Type 2 diabetes mellitus with diabetic chronic kidney disease: Secondary | ICD-10-CM | POA: Diagnosis present

## 2021-10-16 LAB — COMPREHENSIVE METABOLIC PANEL
ALT: 10 U/L (ref 0–44)
AST: 26 U/L (ref 15–41)
Albumin: 1.9 g/dL — ABNORMAL LOW (ref 3.5–5.0)
Alkaline Phosphatase: 59 U/L (ref 38–126)
Anion gap: 12 (ref 5–15)
BUN: 59 mg/dL — ABNORMAL HIGH (ref 8–23)
CO2: 23 mmol/L (ref 22–32)
Calcium: 7.2 mg/dL — ABNORMAL LOW (ref 8.9–10.3)
Chloride: 97 mmol/L — ABNORMAL LOW (ref 98–111)
Creatinine, Ser: 7.71 mg/dL — ABNORMAL HIGH (ref 0.44–1.00)
GFR, Estimated: 5 mL/min — ABNORMAL LOW (ref >60)
Glucose, Bld: 255 mg/dL — ABNORMAL HIGH (ref 70–99)
Potassium: 3.9 mmol/L (ref 3.5–5.1)
Sodium: 132 mmol/L — ABNORMAL LOW (ref 135–145)
Total Bilirubin: 0.5 mg/dL (ref 0.3–1.2)
Total Protein: 5.9 g/dL — ABNORMAL LOW (ref 6.5–8.1)

## 2021-10-16 LAB — RESP PANEL BY RT-PCR (FLU A&B, COVID) ARPGX2
Influenza A by PCR: NEGATIVE
Influenza B by PCR: NEGATIVE
SARS Coronavirus 2 by RT PCR: NEGATIVE

## 2021-10-16 LAB — CBC WITH DIFFERENTIAL/PLATELET
Abs Immature Granulocytes: 0.01 10*3/uL (ref 0.00–0.07)
Basophils Absolute: 0 10*3/uL (ref 0.0–0.1)
Basophils Relative: 1 %
Eosinophils Absolute: 0 10*3/uL (ref 0.0–0.5)
Eosinophils Relative: 0 %
HCT: 32.2 % — ABNORMAL LOW (ref 36.0–46.0)
Hemoglobin: 9.9 g/dL — ABNORMAL LOW (ref 12.0–15.0)
Immature Granulocytes: 0 %
Lymphocytes Relative: 9 %
Lymphs Abs: 0.4 10*3/uL — ABNORMAL LOW (ref 0.7–4.0)
MCH: 27.8 pg (ref 26.0–34.0)
MCHC: 30.7 g/dL (ref 30.0–36.0)
MCV: 90.4 fL (ref 80.0–100.0)
Monocytes Absolute: 0.3 10*3/uL (ref 0.1–1.0)
Monocytes Relative: 7 %
Neutro Abs: 3.4 10*3/uL (ref 1.7–7.7)
Neutrophils Relative %: 83 %
Platelets: 310 10*3/uL (ref 150–400)
RBC: 3.56 MIL/uL — ABNORMAL LOW (ref 3.87–5.11)
RDW: 17.1 % — ABNORMAL HIGH (ref 11.5–15.5)
WBC: 4 10*3/uL (ref 4.0–10.5)
nRBC: 0 % (ref 0.0–0.2)

## 2021-10-16 LAB — LACTIC ACID, PLASMA
Lactic Acid, Venous: 0.7 mmol/L (ref 0.5–1.9)
Lactic Acid, Venous: 2 mmol/L (ref 0.5–1.9)
Lactic Acid, Venous: 2.2 mmol/L (ref 0.5–1.9)

## 2021-10-16 LAB — PROTIME-INR
INR: 1.3 — ABNORMAL HIGH (ref 0.8–1.2)
Prothrombin Time: 16.5 seconds — ABNORMAL HIGH (ref 11.4–15.2)

## 2021-10-16 LAB — PROCALCITONIN: Procalcitonin: 6.86 ng/mL

## 2021-10-16 LAB — MAGNESIUM: Magnesium: 1.7 mg/dL (ref 1.7–2.4)

## 2021-10-16 LAB — APTT: aPTT: 50 seconds — ABNORMAL HIGH (ref 24–36)

## 2021-10-16 MED ORDER — DELFLEX-LC/1.5% DEXTROSE 344 MOSM/L IP SOLN
INTRAPERITONEAL | Status: DC
  Administered 2021-10-18 – 2021-10-26 (×4): 6000 mL via INTRAPERITONEAL
  Filled 2021-10-16 (×22): qty 3000

## 2021-10-16 MED ORDER — SODIUM CHLORIDE 0.9 % IV SOLN: 2.0000 g | Freq: Once | INTRAVENOUS | Status: DC

## 2021-10-16 MED ORDER — HEPARIN SODIUM (PORCINE) 5000 UNIT/ML IJ SOLN
5000.0000 [IU] | Freq: Three times a day (TID) | INTRAMUSCULAR | Status: DC
  Administered 2021-10-16 – 2021-10-19 (×8): 5000 [IU] via SUBCUTANEOUS
  Filled 2021-10-16 (×8): qty 1

## 2021-10-16 MED ORDER — METRONIDAZOLE 500 MG/100ML IV SOLN: 500.0000 mg | Freq: Two times a day (BID) | INTRAVENOUS | Status: DC

## 2021-10-16 MED ORDER — PANTOPRAZOLE SODIUM 40 MG PO TBEC
40.0000 mg | DELAYED_RELEASE_TABLET | Freq: Every day | ORAL | Status: DC
  Filled 2021-10-16: qty 1

## 2021-10-16 MED ORDER — ONDANSETRON HCL 4 MG PO TABS: 4.0000 mg | ORAL_TABLET | Freq: Four times a day (QID) | ORAL | Status: DC | PRN

## 2021-10-16 MED ORDER — SODIUM CHLORIDE 0.9 % IV SOLN
2.0000 g | Freq: Once | INTRAVENOUS | Status: AC
  Administered 2021-10-16: 2 g via INTRAVENOUS
  Filled 2021-10-16: qty 2

## 2021-10-16 MED ORDER — LABETALOL HCL 5 MG/ML IV SOLN
5.0000 mg | INTRAVENOUS | Status: DC | PRN
  Administered 2021-10-16 – 2021-10-17 (×2): 5 mg via INTRAVENOUS
  Filled 2021-10-16 (×2): qty 4

## 2021-10-16 MED ORDER — GENTAMICIN SULFATE 0.1 % EX CREA
1.0000 "application " | TOPICAL_CREAM | Freq: Every day | CUTANEOUS | Status: DC
  Administered 2021-10-18 – 2021-11-03 (×9): 1 via TOPICAL
  Filled 2021-10-16 (×3): qty 15

## 2021-10-16 MED ORDER — ACETAMINOPHEN 650 MG RE SUPP: 650.0000 mg | Freq: Four times a day (QID) | RECTAL | Status: DC | PRN

## 2021-10-16 MED ORDER — ISOSORBIDE MONONITRATE ER 30 MG PO TB24
30.0000 mg | ORAL_TABLET | Freq: Every day | ORAL | Status: DC
  Filled 2021-10-16: qty 1

## 2021-10-16 MED ORDER — INSULIN ASPART 100 UNIT/ML IJ SOLN
0.0000 [IU] | Freq: Three times a day (TID) | INTRAMUSCULAR | Status: DC
  Administered 2021-10-17: 2 [IU] via SUBCUTANEOUS
  Administered 2021-10-17: 5 [IU] via SUBCUTANEOUS
  Administered 2021-10-18: 1 [IU] via SUBCUTANEOUS
  Administered 2021-10-18 – 2021-10-20 (×5): 2 [IU] via SUBCUTANEOUS
  Filled 2021-10-16 (×9): qty 1

## 2021-10-16 MED ORDER — INSULIN ASPART 100 UNIT/ML IJ SOLN
0.0000 [IU] | Freq: Every day | INTRAMUSCULAR | Status: DC
  Administered 2021-10-16: 2 [IU] via SUBCUTANEOUS
  Administered 2021-10-17 – 2021-10-19 (×2): 3 [IU] via SUBCUTANEOUS
  Filled 2021-10-16 (×3): qty 1

## 2021-10-16 MED ORDER — INSULIN GLARGINE-YFGN 100 UNIT/ML ~~LOC~~ SOLN
5.0000 [IU] | Freq: Every day | SUBCUTANEOUS | Status: DC
  Filled 2021-10-16 (×2): qty 0.05

## 2021-10-16 MED ORDER — METRONIDAZOLE 500 MG/100ML IV SOLN
500.0000 mg | Freq: Once | INTRAVENOUS | Status: AC
  Administered 2021-10-16: 500 mg via INTRAVENOUS
  Filled 2021-10-16: qty 100

## 2021-10-16 MED ORDER — HYDRALAZINE HCL 10 MG PO TABS
10.0000 mg | ORAL_TABLET | Freq: Four times a day (QID) | ORAL | Status: DC | PRN
  Filled 2021-10-16: qty 1

## 2021-10-16 MED ORDER — SODIUM CHLORIDE 0.9 % IV SOLN
1.0000 g | INTRAVENOUS | Status: DC
  Administered 2021-10-17: 1 g via INTRAVENOUS
  Filled 2021-10-16 (×2): qty 1

## 2021-10-16 MED ORDER — CLINDAMYCIN HCL 300 MG PO CAPS: 300.0000 mg | ORAL_CAPSULE | Freq: Two times a day (BID) | ORAL | 0 refills | Status: AC

## 2021-10-16 MED ORDER — ACETAMINOPHEN 325 MG PO TABS
650.0000 mg | ORAL_TABLET | Freq: Four times a day (QID) | ORAL | Status: DC | PRN
  Administered 2021-10-27: 650 mg via ORAL
  Filled 2021-10-16: qty 2

## 2021-10-16 MED ORDER — ONDANSETRON HCL 4 MG/2ML IJ SOLN: 4.0000 mg | Freq: Four times a day (QID) | INTRAMUSCULAR | Status: DC | PRN

## 2021-10-16 MED ORDER — FUROSEMIDE 10 MG/ML IJ SOLN
40.0000 mg | Freq: Once | INTRAMUSCULAR | Status: AC
  Administered 2021-10-16: 40 mg via INTRAVENOUS
  Filled 2021-10-16: qty 4

## 2021-10-16 MED ORDER — CLONIDINE HCL 0.1 MG PO TABS
0.1000 mg | ORAL_TABLET | Freq: Every day | ORAL | Status: DC
  Filled 2021-10-16: qty 1

## 2021-10-16 MED ORDER — VANCOMYCIN HCL IN DEXTROSE 1-5 GM/200ML-% IV SOLN
1000.0000 mg | Freq: Once | INTRAVENOUS | Status: AC
  Administered 2021-10-16: 1000 mg via INTRAVENOUS
  Filled 2021-10-16: qty 200

## 2021-10-16 MED ORDER — LACTATED RINGERS IV SOLN: INTRAVENOUS | Status: DC

## 2021-10-16 MED ORDER — METHYLPREDNISOLONE SODIUM SUCC 125 MG IJ SOLR
80.0000 mg | Freq: Once | INTRAMUSCULAR | Status: AC
  Administered 2021-10-16: 80 mg via INTRAVENOUS
  Filled 2021-10-16: qty 2

## 2021-10-16 NOTE — ED Provider Notes (Signed)
[facility]    CSN: [id number] Arrival date & time: 10/24/2021  1118      History   Chief Complaint Chief Complaint  Patient presents with   Oral Swelling    HPI Catherine Burke is a 81 y.o. female.    Presenting today with ongoing and worsening right cheek swelling, pain, sore throat/mouth pain over the last few days.  Patient was admitted to the hospital 2 days ago for pneumoperitoneum suspected to be related to peritoneal dialysis, discharged home and then return to the emergency department at [facility] yesterday for bilateral shoulder pain, mild abdominal pain which was evaluated with no emergent or is new abnormal findings.  She was discharged home without any interventions and states the orofacial issues have worsened since yesterday.  Per chart review these issues were not evaluated in the emergency department yesterday.  She did have a low-grade fever yesterday per family members but no fever today.  No known dental issues that they are aware of.  She has not been eating or drinking well because of the pain in her swollen facial area.  Not trying any medications for symptoms thus far.  History of similar issues in the past.  Past medical history significant for end-stage renal disease on peritoneal dialysis, diabetes, hypertension.    Past Medical History:  Diagnosis Date   Anemia    Arthritis    B12 deficiency 04/04/2021   Diabetes mellitus    GERD (gastroesophageal reflux disease)    HLD (hyperlipidemia)    Hypercholesterolemia    Hypertension    Kidney stones    Osteoporosis    Proteinuria     Patient Active Problem List   Diagnosis Date Noted   Abdominal pain 10/13/2021   PD catheter dysfunction, initial encounter ([place])    Cellulitis 07/20/2021   Abdominal wall cellulitis 07/20/2021   B12 deficiency 04/04/2021   Symptomatic anemia 07/22/2020   Acute on chronic anemia 07/21/2020   ESRD (end stage renal disease) ([place]) 07/21/2020   Peritoneal dialysis  status ([place]) 07/21/2020   Chronic kidney disease (CKD), stage IV (severe) ([facility]) 02/08/2018   Proteinuria 10/10/2017   Chronic kidney disease (CKD), stage III (moderate) ([place]) 02/24/2015   Kidney stones    Osteoporosis    GERD (gastroesophageal reflux disease)    Diabetes mellitus ([place]) 01/17/2007   Hyperlipidemia 01/17/2007   Essential hypertension 01/17/2007   OSTEOPENIA 01/17/2007   HYSTERECTOMY, PARTIAL, HX OF 01/17/2007    Past Surgical History:  Procedure Laterality Date   ABDOMINAL HYSTERECTOMY     BACK SURGERY     lumbar   BREAST CYST ASPIRATION     unsure of laterality    CAPD INSERTION N/A 02/13/2018   Procedure: LAPAROSCOPIC INSERTION CONTINUOUS AMBULATORY PERITONEAL DIALYSIS  (CAPD) CATHETER;  Surgeon: Algernon Huxley, MD;  Location: [facility] ORS;  Service: Vascular;  Laterality: N/A;   CAPD INSERTION N/A 07/21/2021   Procedure: LAPAROSCOPIC INSERTION CONTINUOUS AMBULATORY PERITONEAL DIALYSIS  (CAPD) CATHETER;  Surgeon: Ronny Bacon, MD;  Location: [facility] ORS;  Service: General;  Laterality: N/A;   CAPD REMOVAL N/A 07/21/2021   Procedure: REMOVAL CONTINUOUS AMBULATORY PERITONEAL DIALYSIS  (CAPD) CATHETER;  Surgeon: Ronny Bacon, MD;  Location: [facility] ORS;  Service: General;  Laterality: N/A;   CHOLECYSTECTOMY     EYE SURGERY     bilateral cataract    NASAL SEPTUM SURGERY      OB History   No obstetric history on file.      Home Medications  Prior to Admission medications   Medication Sig Start Date End Date Taking? Authorizing Provider  clindamycin (CLEOCIN) 300 MG capsule Take 1 capsule (300 mg total) by mouth 2 (two) times daily. 10/10/2021  Yes Volney American, PA-C  ACCU-CHEK AVIVA PLUS test strip TESTING TWICE DAILY. 08/11/14   Susy Frizzle, MD  Acetaminophen (TYLENOL) 325 MG CAPS Take 1 capsule by mouth daily as needed (pain).    [provider]  atorvastatin (LIPITOR) 10 MG tablet Take 10 mg by mouth daily. 07/28/20   [provider]  B Complex-C-Biotin-E-Min-FA (DIALYVITE 5000) 5 MG TABS Take 1 tablet by mouth daily. 10/27/20   [provider]  cloNIDine (CATAPRES) 0.1 MG tablet Take by mouth. 07/19/20   [provider]  hydrALAZINE (APRESOLINE) 100 MG tablet Take 1 tablet (100 mg total) by mouth every 8 (eight) hours. 10/13/17 07/21/20  Henreitta Leber, MD  insulin glargine (LANTUS SOLOSTAR) 100 UNIT/ML Solostar Pen Inject 5 Units into the skin at bedtime. 10/14/21   Aline August, MD  Insulin Pen Needle 31G X 5 MM MISC As directed 03/31/15   Alycia Rossetti, MD  isosorbide mononitrate (IMDUR) 30 MG 24 hr tablet Take 30 mg by mouth daily. 08/12/20   [provider]  Magnesium 400 MG CAPS Take 1 tablet by mouth daily. 08/31/21   [provider]  ondansetron (ZOFRAN) 4 MG tablet Take 1 tablet (4 mg total) by mouth every 6 (six) hours as needed for nausea. 10/14/21   Aline August, MD  pantoprazole (PROTONIX) 40 MG tablet Take 1 tablet (40 mg total) by mouth daily. 10/14/21   Aline August, MD  senna (SENOKOT) 8.6 MG tablet Take 2 tablets by mouth daily.    [provider]  spironolactone (ALDACTONE) 25 MG tablet Take 1 tablet (25 mg total) by mouth daily. 10/14/17 07/21/20  Henreitta Leber, MD  telmisartan (MICARDIS) 40 MG tablet Take by mouth. 06/21/21   [provider]  torsemide (DEMADEX) 100 MG tablet Take 100 mg by mouth daily as needed. 04/25/21   [provider]  Vitamin D, Ergocalciferol, (DRISDOL) 1.25 MG (50000 UNIT) CAPS capsule Take 50,000 Units by mouth once a week. 06/21/21   [provider]    Family History Family History  Problem Relation Age of Onset   Other Mother    Heart attack Father    Diabetes Maternal Grandmother    Breast cancer Paternal Aunt     Social History Social History   Tobacco Use   Smoking status: Never   Smokeless tobacco: Never  Vaping Use   Vaping Use: Never used  Substance Use Topics    Alcohol use: No    Alcohol/week: 0.0 standard drinks   Drug use: No     Allergies   Levofloxacin, Penicillins, Other, Penicillin g, and Sulfa antibiotics   Review of Systems Review of Systems Per HPI  Physical Exam Triage Vital Signs ED Triage Vitals  Enc Vitals Group     BP 10/15/2021 1322 (!) 182/90     Pulse Rate 10/24/2021 1322 93     Resp 10/21/2021 1322 20     Temp 10/12/2021 1322 (!) 97.5 F (36.4 C)     Temp Source 10/31/2021 1322 Oral     SpO2 10/11/2021 1322 97 %     Weight 10/21/2021 1324 119 lb 0.8 oz (54 kg)     Height 10/29/2021 1324 5\' 4"  (1.626 m)     Head Circumference --  Peak Flow --      Pain Score 10/13/2021 1323 8     Pain Loc --      Pain Edu? --      Excl. in [place]? --    No data found.  Updated Vital Signs BP (!) 182/90 (BP Location: Right Arm)    Pulse 93    Temp (!) 97.5 F (36.4 C) (Oral)    Resp 20    Ht 5\' 4"  (1.626 m)    Wt 119 lb 0.8 oz (54 kg)    SpO2 97%    BMI 20.43 kg/m   Visual Acuity Right Eye Distance:   Left Eye Distance:   Bilateral Distance:    Right Eye Near:   Left Eye Near:    Bilateral Near:     Physical Exam Vitals and nursing note reviewed.  Constitutional:      Appearance: She is ill-appearing.  HENT:     Right Ear: Tympanic membrane normal.     Left Ear: Tympanic membrane normal.     Nose: Nose normal.     Mouth/Throat:     Mouth: Mucous membranes are moist.     Comments: Somewhat fluctuant right facial swelling in area of jaw, tender to palpation.  No obvious dental infection, ulceration in the mouth.  Oral airway patent Eyes:     Extraocular Movements: Extraocular movements intact.     Conjunctiva/sclera: Conjunctivae normal.  Cardiovascular:     Rate and Rhythm: Normal rate and regular rhythm.     Heart sounds: Normal heart sounds.  Pulmonary:     Effort: Pulmonary effort is normal.     Breath sounds: Normal breath sounds. No wheezing or rales.  Musculoskeletal:     Cervical back: Normal range of motion and  neck supple.     Comments: In wheelchair, unclear baseline ambulatory status  Skin:    General: Skin is warm and dry.  Neurological:     Mental Status: Mental status is at baseline.  Psychiatric:        Mood and Affect: Mood normal.        Thought Content: Thought content normal.        Judgment: Judgment normal.     UC Treatments / Results  Labs (all labs ordered are listed, but only abnormal results are displayed) Labs Reviewed - No data to display  EKG   Radiology DG Chest [facility] 1 View  Result Date: 10/15/2021 CLINICAL DATA:  Fever.  Possible sepsis. EXAM: PORTABLE CHEST 1 VIEW COMPARISON:  Chest radiograph dated 10/13/2021. FINDINGS: Minimal left lung base atelectasis similar or improved since the prior radiograph. Trace left pleural effusion. No new consolidation. No pneumothorax. The cardiac silhouette is within limits. Atherosclerotic calcification of the aorta. Osteopenia with degenerative changes of the spine. No acute osseous pathology. Minimal pneumoperitoneum along the right hemidiaphragm, decreased since the prior study. IMPRESSION: 1. Minimal left lung base atelectasis similar or improved since the prior radiograph. Trace left pleural effusion. 2. Minimal residual pneumoperitoneum under the right hemidiaphragm. Electronically Signed   By: Anner Crete M.D.   On: 10/15/2021 22:18    Procedures Procedures (including critical care time)  Medications Ordered in UC Medications - No data to display  Initial Impression / Assessment and Plan / UC Course  I have reviewed the triage vital signs and the nursing notes.  Pertinent labs & imaging results that were available during my care of the patient were reviewed by me and considered in my medical  decision making (see chart for details).     Unclear if dental infection or salivary infection versus other causes.  Discussed that given the complexity of her medical condition it would be safest to further evaluate in the  emergency department but they declined as she was just in the hospital in the past 2 days.  We will treat with clindamycin in case infectious cause but discussed if not improving or worsening over the next 12 hours that she would need to go to the emergency department for further evaluation.  They are agreeable to this plan, supportive home care and return precautions reviewed.  45 minutes spent today in direct patient care, evaluation, education and counseling  Final Clinical Impressions(s) / UC Diagnoses   Final diagnoses:  Facial swelling  Oral pain   Discharge Instructions   None    ED Prescriptions     Medication Sig Dispense Auth. Provider   clindamycin (CLEOCIN) 300 MG capsule Take 1 capsule (300 mg total) by mouth 2 (two) times daily. 14 capsule Volney American, [place]      PDMP not reviewed this encounter.   Volney American, [place] 11/01/2021 1555

## 2021-10-16 NOTE — Assessment & Plan Note (Addendum)
-   Holding home ARB at this time - One time dose of solu-medrol 80 mg IV  - Patient is protecting airway - N.p.o. except for sips with meds and ice chips

## 2021-10-16 NOTE — Hospital Course (Signed)
[name] is a 81 year old with medical history of end-stage renal disease on peritoneal dialysis, hypertension, hyperlipidemia, insulin-dependent diabetes mellitus, who presents emergency department for chief concerns of right facial swelling.  Vitals in the emergency department show temperature of 97.8 and improved to 98.5, respiration rate of 20, heart rate of 93, blood pressure 170/69, SPO2 98% on room air.  Serum sodium was 132, potassium 3.9, chloride of 97, bicarb 23, BUN of 59, serum creatinine of 7.71, nonfasting blood glucose 255, GFR 5.  WBC was 4, hemoglobin 9.3, platelets of 310.  COVID/influenza A/influenza B PCR were negative.  Blood cultures x2 have been collected and are in process.  Lactic acid was elevated at 2.0 from 0.7.  CT soft tissue of the neck without contrast was read as right lower facial soft tissue swelling, possible cellulitis.  No drainable fluid collection identified.  Moderate hypopharyngeal swelling, new from 0/04/6760 and of uncertain etiology though possibly infectious or allergic.  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  Nonemergent contrast enhanced brain MRI is recommended. ED treatment: Cefepime 2 g IV, Flagyl 500 mg IV one-time dose, vancomycin 1 g IV.

## 2021-10-16 NOTE — Assessment & Plan Note (Signed)
-   Continue Vanco and cefepime - Follow-up with blood cultures - Sepsis bolus has not been given due to patient on peritoneal dialysis

## 2021-10-16 NOTE — Progress Notes (Signed)
CODE SEPSIS - PHARMACY COMMUNICATION  **Broad Spectrum Antibiotics should be administered within 1 hour of Sepsis diagnosis**  Time Code Sepsis Called/Page Received: 1644  Antibiotics Ordered: vancomycin, cefepime, metronidazole  Time of 1st antibiotic administration: [place] ,PharmD Clinical Pharmacist  10/24/2021  4:50 PM

## 2021-10-16 NOTE — ED Notes (Signed)
Facial swelling, tongue, lips since yesterday. Throat and tongue getting worse. Pt having trouble swallowing and speaking.

## 2021-10-16 NOTE — ED Notes (Signed)
Pt has visible swelling to upper lip, more on right side.  Pt denies pain, tingling, or respiratory issues.

## 2021-10-16 NOTE — ED Triage Notes (Addendum)
Pt seen at ED last night for similar and reports continued symptoms and pain.   Pt family states was admitted to evaluate for free air in abdomen on 2/9 at [facility].Pt reports generalized swelling and oral swelling/ulcers for last several days and bilateral shoulder pain with no known injury. Excessive saliva noted in triage.  Pt reports was sent home from ED last night and reports continued pain, swelling. Family unsure of what next steps to take.

## 2021-10-16 NOTE — ED Provider Notes (Signed)
[facility] Provider Note    Event Date/Time   First MD Initiated Contact with Patient 10/27/2021 1631     (approximate)  History   Chief Complaint: Facial Swelling  HPI  Catherine Burke is a 81 y.o. 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  According to the patient and family since yesterday the patient has been experiencing swelling to the right face.  They were seen at [facility] emergency department yesterday at that time patient was complaining of right shoulder pain, found to be febrile to 102.  Patient had a reassuring work-up was ultimately discharged home.  Patient states since going home she has had continued swelling to the right face tongue and somewhat to the neck.  Patient denies any dental pain.  She is now having trouble swallowing secondary to swelling.  Physical Exam   Triage Vital Signs: ED Triage Vitals [10/15/2021 1609]  Enc Vitals Group     BP (!) 162/77     Pulse Rate 95     Resp 18     Temp 98.5 F (36.9 C)     Temp Source Oral     SpO2 95 %     Weight      Height      Head Circumference      Peak Flow      Pain Score      Pain Loc      Pain Edu?      Excl. in [place]?     Most recent vital signs: Vitals:   10/26/2021 1609  BP: (!) 162/77  Pulse: 95  Resp: 18  Temp: 98.5 F (36.9 C)  SpO2: 95%    General: Awake, no distress.  CV:  Good peripheral perfusion.  Regular rate and rhythm  Resp:  Normal effort.  Equal breath sounds bilaterally.  Abd:  No distention.  Soft, nontender.  No rebound or guarding. Other:  Patient has moderate swelling to the right face upper lip and mild swelling to the right side of the tongue.  Mildly tender to this area as well.   ED Results / Procedures / Treatments   EKG  EKG viewed and interpreted by myself shows a normal sinus  rhythm at 91 bpm with a narrow QRS, normal axis, normal intervals, no concerning ST changes.  RADIOLOGY  I personally reviewed the CT images.  Patient appears to have swelling along the right face on the CT, but I do not appreciate any obvious abscess. CT is read as right lower facial tissue swelling possibly cellulitis no drainable fluid collection.  Moderate hypopharyngeal swelling new from 10/13/2021 possibly infectious. 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  Chest x-ray is negative for acute abnormality.   MEDICATIONS ORDERED IN ED: Medications  lactated ringers infusion (has no administration in time range)  metroNIDAZOLE (FLAGYL) IVPB 500 mg (has no administration in time range)  vancomycin (VANCOCIN) IVPB 1000 mg/200 mL premix (has no administration in time range)  ceFEPIme (MAXIPIME) 2 g in sodium chloride 0.9 % 100 mL IVPB (has no administration in time range)     IMPRESSION / MDM / ASSESSMENT AND PLAN / ED COURSE  I reviewed the triage vital signs and the nursing notes.  Patient presents to the emergency department for right facial swelling and difficulty swallowing due to the swelling.  Examination is concerning for  possible angioedema, I reviewed the patient's medication list she is on an ARB, but no ACE inhibitors.  Differential would also include abscess/cellulitis.  Patient was seen yesterday at [facility] emergency department however in reviewing the patient's records it appears that at that time she was complaining of bilateral shoulder pain and did not have any swelling noted on exam.  Patient was recently discharged from the hospital 10/14/2021 after an admission for possible free intraperitoneal air.  Ultimately pneumoperitoneum was thought to be related to the peritoneal dialysis catheter and they did not see any signs of viscus perforation.  Patient was ultimately discharged.  Patient states no fever since yesterday.  However given the patient's reported fever  as high as 102 with recent admission for intra-abdominal free air we will start the patient on broad-spectrum antibiotics.  Given the patient's right facial swelling extending from the right face down into the right neck but also including the right tongue highly suspicious for angioedema.  We will obtain CT imaging of the face and neck to rule out abscess/cellulitis/infection.  We will check labs, blood cultures and continue to closely monitor.  Patient ultimately will require admission to the hospital once her emergency department work-up is been completed.  Family and patient are agreeable to this plan.  We will call the dialysis nurse to attempt to have a PD catheter sample obtained for evaluation and culture.  Patient's lab work has resulted showing chemistry largely at her baseline values with known elevated creatinine.  Patient's COVID and flu is negative.  White blood cell count is normal at 4.0, lactate is slightly elevated at 2.0.  Given the patient's fever of 102 yesterday with swelling to the right face possible infectious causes identified in the face we will admit to the hospital service for continued IV antibiotics.  Patient had blood cultures drawn yesterday which are pending.  Patient agreeable to plan of care.  No clinical concern for impending airway compromise.  FINAL CLINICAL IMPRESSION(S) / ED DIAGNOSES   Facial swelling Cellulitis Fever   Note:  This document was prepared using Dragon voice recognition software and may include unintentional dictation errors.   Harvest Dark, MD 10/08/2021 [phone]

## 2021-10-16 NOTE — ED Notes (Signed)
On call dialysis nurse Lorriane Shire paged regarding peritoneal sample, awaiting return call.

## 2021-10-16 NOTE — Progress Notes (Signed)
PHARMACY -  BRIEF ANTIBIOTIC NOTE   Pharmacy has received consult(s) for aztreonam and vancomycin from an ED provider.  The patient's profile has been reviewed for ht/wt/allergies/indication/available labs.  Pt has received cefepime in the past. Will change aztreonam to cefepime.   One time order(s) placed for: Cefepime 2 g  Vancomycin 1 g   Further antibiotics/pharmacy consults should be ordered by admitting physician if indicated.                       Thank you, Forde Dandy Wirt Hemmerich 10/17/2021  4:49 PM

## 2021-10-16 NOTE — Assessment & Plan Note (Signed)
-   Currently on peritoneal dialysis nightly - Nephrology has been consulted for peritoneal dialysis resumption, with notes that patient has missed 2 nights of dialysis due to ED visits

## 2021-10-16 NOTE — ED Notes (Signed)
Update given to daughter and daughter to pick pt up soon

## 2021-10-16 NOTE — Assessment & Plan Note (Signed)
-   Holding home ARB at this time due to facial swelling - Resumed home clonidine 0.1 mg daily - Hydralazine 10 mg p.o. every 6 hours as needed for SBP greater than 175 - Resumed isosorbide mononitrate 30 mg daily

## 2021-10-16 NOTE — ED Notes (Signed)
Family states that pt has her peritoneal dialysis nightly and has missed the last 2 nights.  They are concerned about her dialysis schedule.  IPMD [facility] and [place] notified and asked to please advise.

## 2021-10-16 NOTE — ED Notes (Signed)
Pt has swelling of her top lip and R side of her face- pt was seen at [facility] yesterday for same but family states it has gotten worse since she left- pt also had a fever at [facility]

## 2021-10-16 NOTE — Consult Note (Signed)
Pharmacy Antibiotic Note  Catherine Burke is a 81 y.o. female admitted on 11/01/2021 with sepsis.  Pharmacy has been consulted for vancomycin and cefepime dosing. Pt is ESRD on PD.   Plan: Vancomycin 1000 mg IV loading dose. Will order vancomycin random level 48 hours after loading dose.   Cefepime 1 g IV q24h  Monitor clinical picture and vancomycin levels F/U C&S, abx deescalation / LOT   Height: 5\' 4"  (162.6 cm) Weight: 54.3 kg (119 lb 11.4 oz) IBW/kg (Calculated) : 54.7  Temp (24hrs), Avg:97.8 F (36.6 C), Min:97.5 F (36.4 C), Max:98.5 F (36.9 C)  Recent Labs  Lab 10/13/21 1136 10/15/21 2200 10/07/2021 0057 10/12/2021 1621 10/24/2021 1718 10/19/2021 1759  WBC 6.7 5.2  --  4.0  --   --   CREATININE 5.76* 6.62*  --   --   --  7.71*  LATICACIDVEN  --  1.0 0.7  --  2.0*  --     Estimated Creatinine Clearance: 4.8 mL/min (A) (by C-G formula based on SCr of 7.71 mg/dL (H)).    Allergies  Allergen Reactions   Levofloxacin Other (See Comments)    Throat Swelling    Penicillins Anaphylaxis   Other    Penicillin G Other (See Comments)   Sulfa Antibiotics     Antimicrobials this admission: 2/12 Flagyl x 1 2/12 cefepime >>  2/12 Vancomycin >>   Dose adjustments this admission:   Microbiology results: 2/12 BCx: pending 2/12 UCx: pending   Thank you for allowing pharmacy to be a part of this patients care.  Darnelle Bos, PharmD 10/29/2021 10:25 PM

## 2021-10-16 NOTE — H&P (Signed)
History and Physical   [name] XTG:[id number] DOB: 11-30-1940 DOA: 10/21/2021  PCP: Idelle Crouch, MD  Outpatient Specialists: Dr. Stevphen [facility] clinic cardiology Patient coming from: Home  I have personally briefly reviewed patient's old medical records in [place].  Chief Concern: Facial swelling  HPI: [name] is a 81 year old with medical history of end-stage renal disease on peritoneal dialysis, hypertension, hyperlipidemia, insulin-dependent diabetes mellitus, who presents emergency department for chief concerns of right facial swelling.  Vitals in the emergency department show temperature of 97.8 and improved to 98.5, respiration rate of 20, heart rate of 93, blood pressure 170/69, SPO2 98% on room air.  Serum sodium was 132, potassium 3.9, chloride of 97, bicarb 23, BUN of 59, serum creatinine of 7.71, nonfasting blood glucose 255, GFR 5.  WBC was 4, hemoglobin 9.3, platelets of 310.  COVID/influenza A/influenza B PCR were negative.  Blood cultures x2 have been collected and are in process.  Lactic acid was elevated at 2.0 from 0.7.  CT soft tissue of the neck without contrast was read as right lower facial soft tissue swelling, possible cellulitis.  No drainable fluid collection identified.  Moderate hypopharyngeal swelling, new from 10/11/348 and of uncertain etiology though possibly infectious or allergic.  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  Nonemergent contrast enhanced brain MRI is recommended. ED treatment: Cefepime 2 g IV, Flagyl 500 mg IV one-time dose, vancomycin 1 g IV.  At bedside, she is able to tell me her name, age, current location, and current year.  The lips started swelling yesterday 10/15/21 when she woke up in the morning. She denies recent changes to medications, diet. She endorses a fever of 101 at home. She endorses nausea and vomiting this am. She vomited once, and it was  'grayish' color.   She endorses that she is making urine and denies dysuria, hematuria.  Social history: She lives at home by herself. She denies tobacco, etoh, recreational drug use. She is retired and formerly worked as a Patent examiner.   Vaccination history: She is vaccinated for covid 19, 4 doses, and for influenza  ROS: Constitutional: no weight change, + fever ENT/Mouth: no sore throat, no rhinorrhea Eyes: no eye pain, no vision changes Cardiovascular: no chest pain, no dyspnea,  no edema, no palpitations Respiratory: no cough, no sputum, no wheezing Gastrointestinal: + nausea, + vomiting, no diarrhea, no constipation Genitourinary: no urinary incontinence, no dysuria, no hematuria Musculoskeletal: no arthralgias, no myalgias Skin: no skin lesions, no pruritus, Neuro: + weakness, no loss of consciousness, no syncope Psych: no anxiety, no depression, + decrease appetite Heme/Lymph: no bruising, no bleeding  ED Course: Discussed with emergency medicine provider, patient required hospitalization for chief concerns of facial swelling concerning for allergic reaction to ARB.  Assessment/Plan  Principal Problem:   Angioedema, initial encounter Active Problems:   Hyperlipidemia   Essential hypertension   GERD (gastroesophageal reflux disease)   Chronic kidney disease (CKD), stage IV (severe) (HCC)   ESRD (end stage renal disease) (HCC)   Facial cellulitis   * Angioedema, initial encounter Assessment & Plan - Holding home ARB at this time - One time dose of solu-medrol 80 mg IV  - Patient is protecting airway - N.p.o. except for sips with meds and ice chips Essential hypertension Assessment & Plan - Holding home ARB at this time due to facial swelling - Resumed home clonidine 0.1 mg daily - Hydralazine  10 mg p.o. every 6 hours as needed for SBP greater than 175 - Resumed isosorbide mononitrate 30 mg daily  ESRD (end stage renal disease) ([place]) Assessment &  Plan - Currently on peritoneal dialysis nightly - Nephrology has been consulted for peritoneal dialysis resumption, with notes that patient has missed 2 nights of dialysis due to ED visits  Facial cellulitis Assessment & Plan - Continue Vanco and cefepime - Follow-up with blood cultures - Sepsis bolus has not been given due to patient on peritoneal dialysis  Chart reviewed.   Hospitalization from 10/13/2021 to 10/14/2021 for pneumoperitoneum presumed secondary to peritoneal dialysis catheter.  Peritoneal fluid culture has been obtained and per micro section, no growth to date  [facility] emergency medicine visit on 10/15/2021: Presented for fever and bilateral shoulder pain.  Chest x-ray was sent and was found to have no pneumonia.  ED provider reviewed blood cultures obtained during hospitalization on 10/13/2021 and there was no growth.  Lactic acid was normal at that time.  Patient was discharged from the emergency department.  DVT prophylaxis: Heparin 5000 units subcutaneous every 8 hours Code Status: DNR Diet: N.p.o. except for sips with meds and ice chips Family Communication: called and updated daughter, Ms. Farrel Demark Disposition Plan: Pending clinical course Consults called: Nephrology Admission status: Telemetry medical, observation  Past Medical History:  Diagnosis Date   Anemia    Arthritis    B12 deficiency 04/04/2021   Diabetes mellitus    GERD (gastroesophageal reflux disease)    HLD (hyperlipidemia)    Hypercholesterolemia    Hypertension    Kidney stones    Osteoporosis    Proteinuria    Past Surgical History:  Procedure Laterality Date   ABDOMINAL HYSTERECTOMY     BACK SURGERY     lumbar   BREAST CYST ASPIRATION     unsure of laterality    CAPD INSERTION N/A 02/13/2018   Procedure: LAPAROSCOPIC INSERTION CONTINUOUS AMBULATORY PERITONEAL DIALYSIS  (CAPD) CATHETER;  Surgeon: Algernon Huxley, MD;  Location: [facility] ORS;  Service: Vascular;  Laterality: N/A;   CAPD  INSERTION N/A 07/21/2021   Procedure: LAPAROSCOPIC INSERTION CONTINUOUS AMBULATORY PERITONEAL DIALYSIS  (CAPD) CATHETER;  Surgeon: Ronny Bacon, MD;  Location: [facility] ORS;  Service: General;  Laterality: N/A;   CAPD REMOVAL N/A 07/21/2021   Procedure: REMOVAL CONTINUOUS AMBULATORY PERITONEAL DIALYSIS  (CAPD) CATHETER;  Surgeon: Ronny Bacon, MD;  Location: [facility] ORS;  Service: General;  Laterality: N/A;   CHOLECYSTECTOMY     EYE SURGERY     bilateral cataract    NASAL SEPTUM SURGERY     Social History:  reports that she has never smoked. She has never used smokeless tobacco. She reports that she does not drink alcohol and does not use drugs.  Allergies  Allergen Reactions   Levofloxacin Other (See Comments)    Throat Swelling    Penicillins Anaphylaxis   Other    Penicillin G Other (See Comments)   Sulfa Antibiotics    Family History  Problem Relation Age of Onset   Other Mother    Heart attack Father    Diabetes Maternal Grandmother    Breast cancer Paternal Aunt    Family history: Family history reviewed and not pertinent.  Prior to Admission medications   Medication Sig Start Date End Date Taking? Authorizing Provider  atorvastatin (LIPITOR) 10 MG tablet Take 10 mg by mouth daily. 07/28/20  Yes [provider]  B Complex-C-Biotin-E-Min-FA (DIALYVITE 5000) 5 MG TABS Take 1 tablet  by mouth daily. 10/27/20  Yes [provider]  cloNIDine (CATAPRES) 0.1 MG tablet Take by mouth. 07/19/20  Yes [provider]  insulin glargine (LANTUS SOLOSTAR) 100 UNIT/ML Solostar Pen Inject 5 Units into the skin at bedtime. 10/14/21  Yes Aline August, MD  isosorbide mononitrate (IMDUR) 30 MG 24 hr tablet Take 30 mg by mouth daily. 08/12/20  Yes [provider]  Magnesium 400 MG CAPS Take 1 tablet by mouth daily. 08/31/21  Yes [provider]  pantoprazole (PROTONIX) 40 MG tablet Take 1 tablet (40 mg total) by mouth daily. 10/14/21  Yes Aline August, MD  senna (SENOKOT) 8.6 MG tablet Take 2 tablets by mouth daily.   Yes [provider]  telmisartan (MICARDIS) 40 MG tablet Take by mouth. 06/21/21  Yes [provider]  Vitamin D, Ergocalciferol, (DRISDOL) 1.25 MG (50000 UNIT) CAPS capsule Take 50,000 Units by mouth once a week. 06/21/21  Yes [provider]  ACCU-CHEK AVIVA PLUS test strip TESTING TWICE DAILY. 08/11/14   Susy Frizzle, MD  Acetaminophen (TYLENOL) 325 MG CAPS Take 1 capsule by mouth daily as needed (pain).    [provider]  clindamycin (CLEOCIN) 300 MG capsule Take 1 capsule (300 mg total) by mouth 2 (two) times daily. 10/26/2021   Volney American, PA-C  hydrALAZINE (APRESOLINE) 100 MG tablet Take 1 tablet (100 mg total) by mouth every 8 (eight) hours. 10/13/17 07/21/20  Henreitta Leber, MD  Insulin Pen Needle 31G X 5 MM MISC As directed 03/31/15   Alycia Rossetti, MD  ondansetron (ZOFRAN) 4 MG tablet Take 1 tablet (4 mg total) by mouth every 6 (six) hours as needed for nausea. 10/14/21   Aline August, MD  spironolactone (ALDACTONE) 25 MG tablet Take 1 tablet (25 mg total) by mouth daily. 10/14/17 07/21/20  Henreitta Leber, MD  torsemide (DEMADEX) 100 MG tablet Take 100 mg by mouth daily as needed. 04/25/21   [provider]   Physical Exam: Vitals:   10/19/2021 1735 10/21/2021 1800 10/31/2021 1935 10/23/2021 2030  BP: (!) 181/71 (!) 175/74 (!) 181/76 (!) 184/74  Pulse: 84 90 94 96  Resp: (!) 23 20 17 20   Temp:   98 F (36.7 C)   TempSrc:   Oral   SpO2: 99% 100% 99% 99%   Constitutional: appears age appropriate, NAD, calm, comfortable Eyes: PERRL, lids and conjunctivae normal ENMT: Mucous membranes are moist. Posterior pharynx not visualized due to swelling of tongue. Upper lip swelling. Age-appropriate dentition. Hearing appropriate.    Neck: normal, supple, no masses, no thyromegaly Respiratory: clear to auscultation bilaterally, no wheezing, no crackles. Normal  respiratory effort. No accessory muscle use.  Cardiovascular: Regular rate and rhythm, no murmurs / rubs / gallops. No extremity edema. 2+ pedal pulses. No carotid bruits.  Abdomen: no tenderness, PD catheter in place. no masses palpated, no hepatosplenomegaly. Bowel sounds positive.  Musculoskeletal: no clubbing / cyanosis. No joint deformity upper and lower extremities. Good ROM, no contractures, no atrophy. Normal muscle tone.  Skin: no rashes, lesions, ulcers. No induration Neurologic: Sensation intact. Strength 5/5 in all 4.  Psychiatric: Normal judgment and insight. Alert and oriented x 3. Normal mood.   EKG: independently reviewed, showing sinus rhythm with rate of 91, QTc 463, LVH  Chest x-ray on Admission: I personally reviewed and I agree with radiologist reading as below.  CT Soft Tissue Neck Wo Contrast  Result Date: 10/05/2021 CLINICAL DATA:  Right neck/facial swelling, pain, and  sore throat. EXAM: CT NECK WITHOUT CONTRAST TECHNIQUE: Multidetector CT imaging of the neck was performed following the standard protocol without intravenous contrast. RADIATION DOSE REDUCTION: This exam was performed according to the departmental dose-optimization program which includes automated exposure control, adjustment of the mA and/or kV according to patient size and/or use of iterative reconstruction technique. COMPARISON:  Chest CTA 10/13/2021. FINDINGS: Pharynx and larynx: Moderate low-density soft tissue swelling at the level of the hypopharynx bilaterally effacing the para form sinuses, new from the recent chest CTA. Mild narrowing of the lower oropharyngeal and supraglottic airway. No retropharyngeal fluid collection. No evidence of significant edema involving the epiglottis or floor of mouth. Salivary glands: No inflammation, mass, or stone. Thyroid: Thyroid nodules measuring up to 1.4 cm as seen on the recent chest CTA and for which no follow-up imaging is recommended. Lymph nodes: No enlarged or  suspicious lymph nodes in the neck. Vascular: Calcified atherosclerosis at the carotid bifurcations. Limited intracranial: Proximally 2 cm partially calcified mass along the right sphenoid wing, incompletely imaged. Visualized orbits: Bilateral cataract extraction. Mastoids and visualized paranasal sinuses: Mild mucosal thickening/small mucous retention cysts in the left greater than right maxillary sinuses. Trace right mastoid fluid. Skeleton: Moderate cervical disc and facet degeneration. Upper chest: Mild biapical pleuroparenchymal lung scarring. Other: Moderate to prominent right lower facial soft tissue swelling. No drainable fluid collection identified within limitations of noncontrast technique and dental streak artifact. IMPRESSION: 1. Right lower facial soft tissue swelling, possibly cellulitis. No drainable fluid collection identified. 2. Moderate hypopharyngeal swelling, new from 23/76/2831 and of uncertain etiology though possibly infectious or allergic. 3. 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 Consider nonemergent contrast enhanced brain MRI for further evaluation. Electronically Signed   By: Logan Bores M.D.   On: 10/05/2021 17:18   DG Chest Port 1 View  Result Date: 10/23/2021 CLINICAL DATA:  Possible sepsis. EXAM: PORTABLE CHEST 1 VIEW COMPARISON:  10/15/2021 FINDINGS: Artifact overlies the chest. Heart size is normal. There is aortic atherosclerosis. The lungs are clear. No heart failure. No acute finding. IMPRESSION: No active disease. Electronically Signed   By: Nelson Chimes M.D.   On: 10/24/2021 16:57   DG Chest Port 1 View  Result Date: 10/15/2021 CLINICAL DATA:  Fever.  Possible sepsis. EXAM: PORTABLE CHEST 1 VIEW COMPARISON:  Chest radiograph dated 10/13/2021. FINDINGS: Minimal left lung base atelectasis similar or improved since the prior radiograph. Trace left pleural effusion. No new consolidation. No pneumothorax. The cardiac  silhouette is within limits. Atherosclerotic calcification of the aorta. Osteopenia with degenerative changes of the spine. No acute osseous pathology. Minimal pneumoperitoneum along the right hemidiaphragm, decreased since the prior study. IMPRESSION: 1. Minimal left lung base atelectasis similar or improved since the prior radiograph. Trace left pleural effusion. 2. Minimal residual pneumoperitoneum under the right hemidiaphragm. Electronically Signed   By: Anner Crete M.D.   On: 10/15/2021 22:18    Labs on Admission: I have personally reviewed following labs  CBC: Recent Labs  Lab 10/13/21 1136 10/15/21 2200 10/17/2021 1621  WBC 6.7 5.2 4.0  NEUTROABS  --  4.6 3.4  HGB 9.3* 8.2* 9.9*  HCT 29.6* 26.4* 32.2*  MCV 88.9 89.8 90.4  PLT 211 246 517   Basic Metabolic Panel: Recent Labs  Lab 10/13/21 1136 10/13/21 1651 10/15/21 2200 10/22/2021 1759  NA 135  --  133* 132*  K 3.5  --  3.7 3.9  CL 98  --  98 97*  CO2 27  --  24 23  GLUCOSE 211*  --  180* 255*  BUN 37*  --  49* 59*  CREATININE 5.76*  --  6.62* 7.71*  CALCIUM 8.1*  --  7.3* 7.2*  MG  --  1.8  --   --    GFR: Estimated Creatinine Clearance: 4.8 mL/min (A) (by C-G formula based on SCr of 7.71 mg/dL (H)).  Liver Function Tests: Recent Labs  Lab 10/15/21 2200 10/14/2021 1759  AST 24 26  ALT 10 10  ALKPHOS 65 59  BILITOT 0.7 0.5  PROT 6.0* 5.9*  ALBUMIN 1.9* 1.9*   Coagulation Profile: Recent Labs  Lab 10/15/21 2248 10/27/2021 1759  INR 1.2 1.3*   CBG: Recent Labs  Lab 10/13/21 2102 10/14/21 0806 10/14/21 0834  GLUCAP 157* 56* 105*   Urine analysis:    Component Value Date/Time   COLORURINE YELLOW (A) 07/20/2021 1739   APPEARANCEUR CLOUDY (A) 07/20/2021 1739   LABSPEC 1.016 07/20/2021 1739   PHURINE 7.0 07/20/2021 1739   GLUCOSEU 150 (A) 07/20/2021 1739   HGBUR SMALL (A) 07/20/2021 1739   BILIRUBINUR NEGATIVE 07/20/2021 1739   KETONESUR NEGATIVE 07/20/2021 1739   PROTEINUR 100 (A) 07/20/2021  1739   UROBILINOGEN 0.2 02/24/2015 0853   NITRITE NEGATIVE 07/20/2021 1739   LEUKOCYTESUR LARGE (A) 07/20/2021 1739   Dr. Tobie Poet [facility]  If 7PM-7AM, please contact overnight-coverage provider If 7AM-7PM, please contact day coverage provider [email or web address]  10/18/2021, 9:24 PM

## 2021-10-16 NOTE — Sepsis Progress Note (Signed)
Sepsis protocol is being followed by [facility]. 

## 2021-10-17 DIAGNOSIS — T783XXA Angioneurotic edema, initial encounter: Secondary | ICD-10-CM | POA: Diagnosis not present

## 2021-10-17 LAB — CBC WITH DIFFERENTIAL/PLATELET
Abs Immature Granulocytes: 0.02 10*3/uL (ref 0.00–0.07)
Basophils Absolute: 0 10*3/uL (ref 0.0–0.1)
Basophils Relative: 1 %
Eosinophils Absolute: 0 10*3/uL (ref 0.0–0.5)
Eosinophils Relative: 0 %
HCT: 26.7 % — ABNORMAL LOW (ref 36.0–46.0)
Hemoglobin: 8.4 g/dL — ABNORMAL LOW (ref 12.0–15.0)
Immature Granulocytes: 0 %
Lymphocytes Relative: 4 %
Lymphs Abs: 0.2 10*3/uL — ABNORMAL LOW (ref 0.7–4.0)
MCH: 27.9 pg (ref 26.0–34.0)
MCHC: 31.5 g/dL (ref 30.0–36.0)
MCV: 88.7 fL (ref 80.0–100.0)
Monocytes Absolute: 0.2 10*3/uL (ref 0.1–1.0)
Monocytes Relative: 3 %
Neutro Abs: 5.4 10*3/uL (ref 1.7–7.7)
Neutrophils Relative %: 92 %
Platelets: 266 10*3/uL (ref 150–400)
RBC: 3.01 MIL/uL — ABNORMAL LOW (ref 3.87–5.11)
RDW: 16.7 % — ABNORMAL HIGH (ref 11.5–15.5)
Smear Review: NORMAL
WBC Morphology: INCREASED
WBC: 5.7 10*3/uL (ref 4.0–10.5)
nRBC: 0 % (ref 0.0–0.2)

## 2021-10-17 LAB — PHOSPHORUS: Phosphorus: 5.2 mg/dL — ABNORMAL HIGH (ref 2.5–4.6)

## 2021-10-17 LAB — GLUCOSE, CAPILLARY
Glucose-Capillary: 226 mg/dL — ABNORMAL HIGH (ref 70–99)
Glucose-Capillary: 368 mg/dL — ABNORMAL HIGH (ref 70–99)
Glucose-Capillary: 241 mg/dL — ABNORMAL HIGH (ref 70–99)
Glucose-Capillary: 169 mg/dL — ABNORMAL HIGH (ref 70–99)
Glucose-Capillary: 257 mg/dL — ABNORMAL HIGH (ref 70–99)

## 2021-10-17 LAB — HEMOGLOBIN A1C
Hgb A1c MFr Bld: 5.7 % — ABNORMAL HIGH (ref 4.8–5.6)
Mean Plasma Glucose: 116.89 mg/dL

## 2021-10-17 LAB — BODY FLUID CELL COUNT WITH DIFFERENTIAL: Total Nucleated Cell Count, Fluid: 0 cu mm

## 2021-10-17 LAB — BASIC METABOLIC PANEL
Anion gap: 13 (ref 5–15)
BUN: 54 mg/dL — ABNORMAL HIGH (ref 8–23)
CO2: 23 mmol/L (ref 22–32)
Calcium: 7.2 mg/dL — ABNORMAL LOW (ref 8.9–10.3)
Chloride: 96 mmol/L — ABNORMAL LOW (ref 98–111)
Creatinine, Ser: 6.8 mg/dL — ABNORMAL HIGH (ref 0.44–1.00)
GFR, Estimated: 6 mL/min — ABNORMAL LOW (ref >60)
Glucose, Bld: 327 mg/dL — ABNORMAL HIGH (ref 70–99)
Potassium: 3.6 mmol/L (ref 3.5–5.1)
Sodium: 132 mmol/L — ABNORMAL LOW (ref 135–145)

## 2021-10-17 LAB — BODY FLUID CULTURE W GRAM STAIN
Culture: NO GROWTH
Gram Stain: NONE SEEN

## 2021-10-17 LAB — PATHOLOGIST SMEAR REVIEW

## 2021-10-17 MED ORDER — PANTOPRAZOLE SODIUM 40 MG IV SOLR
40.0000 mg | INTRAVENOUS | Status: DC
  Administered 2021-10-17 – 2021-10-30 (×14): 40 mg via INTRAVENOUS
  Filled 2021-10-17 (×14): qty 10

## 2021-10-17 MED ORDER — INSULIN GLARGINE-YFGN 100 UNIT/ML ~~LOC~~ SOLN
5.0000 [IU] | Freq: Every day | SUBCUTANEOUS | Status: DC
  Administered 2021-10-17 – 2021-10-20 (×4): 5 [IU] via SUBCUTANEOUS
  Filled 2021-10-17 (×5): qty 0.05

## 2021-10-17 MED ORDER — LACTATED RINGERS IV SOLN: INTRAVENOUS | Status: DC

## 2021-10-17 MED ORDER — HEPARIN 1000 UNIT/ML FOR PERITONEAL DIALYSIS
INTRAPERITONEAL | Status: DC | PRN
  Filled 2021-10-17 (×3): qty 6000

## 2021-10-17 MED ORDER — METHYLPREDNISOLONE SODIUM SUCC 125 MG IJ SOLR
80.0000 mg | Freq: Once | INTRAMUSCULAR | Status: AC
  Administered 2021-10-17: 80 mg via INTRAVENOUS
  Filled 2021-10-17: qty 2

## 2021-10-17 MED ORDER — HYDRALAZINE HCL 50 MG PO TABS
100.0000 mg | ORAL_TABLET | Freq: Three times a day (TID) | ORAL | Status: DC
  Filled 2021-10-17 (×2): qty 2

## 2021-10-17 MED ORDER — HEPARIN SODIUM (PORCINE) 1000 UNIT/ML IJ SOLN
INTRAMUSCULAR | Status: AC
  Filled 2021-10-17: qty 10

## 2021-10-17 MED ORDER — AMLODIPINE BESYLATE 10 MG PO TABS
10.0000 mg | ORAL_TABLET | Freq: Every day | ORAL | Status: DC
Start: 2021-10-17 — End: 2021-10-17

## 2021-10-17 MED ORDER — HYDRALAZINE HCL 20 MG/ML IJ SOLN
10.0000 mg | INTRAMUSCULAR | Status: DC | PRN
  Administered 2021-10-17 – 2021-10-26 (×8): 10 mg via INTRAVENOUS
  Filled 2021-10-17 (×9): qty 1

## 2021-10-17 NOTE — Progress Notes (Signed)
HD nurse arrived to find patient transferred to floor. PD sample collected and treatment initiated after communicating with on call Nephrologist. At initiation of treatment catheter slow to drain, alarm sounding, patient reposition and no further issue. Patient states that she has had the same issue at home, states she does not use heparin. In sample no fibrin seen. Patient instructed to reposition self and be aware of catheter placement overnight. Order for nine hour treatment, will conclude in late morning.

## 2021-10-17 NOTE — Progress Notes (Signed)
PROGRESS NOTE    Catherine Burke  CHE:[id number] DOB: [birth year] DOA: 10/20/2021 PCP: Idelle Crouch, MD    Brief Narrative:  81 year old with medical history of end-stage renal disease on peritoneal dialysis, hypertension, hyperlipidemia, insulin-dependent diabetes mellitus, who presents emergency department for chief concerns of right facial swelling.   Vitals in the emergency department show temperature of 97.8 and improved to 98.5, respiration rate of 20, heart rate of 93, blood pressure 170/69, SPO2 98% on room air.  Patient is remained hemodynamically stable.  Facial swelling is improved.  Likely etiology is angioedema secondary to ACE inhibitor use.  ACE inhibitor's been discontinued and added to allergy list.   Assessment & Plan:   Principal Problem:   Angioedema, initial encounter Active Problems:   Hyperlipidemia   Essential hypertension   GERD (gastroesophageal reflux disease)   Chronic kidney disease (CKD), stage IV (severe) (HCC)   ESRD (end stage renal disease) (HCC)   Facial cellulitis  * Angioedema, initial encounter Assessment & Plan - Continue holding ARB -Added to medication allergy list -Defer steroids for now considering lack of stridor or wheezing -Continue n.p.o. until SLP evaluation is complete  Essential hypertension Assessment & Plan Poor control over interval -DC ARB - Resumed home clonidine 0.1 mg daily --Resume hydralazine 100 mg p.o. 3 times daily - Resumed isosorbide mononitrate 30 mg daily -IV hydralazine as needed   ESRD (end stage renal disease) ([place]) Assessment & Plan - Currently on peritoneal dialysis nightly - Nephrology has been consulted for peritoneal dialysis resumption, with notes that patient has missed 2 nights of dialysis due to ED visits   Facial cellulitis Assessment & Plan Unable to fully exclude cellulitis - Continue Vanco and cefepime - Follow-up with blood cultures, no growth to date - Sepsis bolus has not been  given due to patient on peritoneal dialysis --Can likely de-escalate/discontinue antibiotics in 24 hours of physical exam improved   DVT prophylaxis: SQ heparin Code Status: DNR Family Communication: Attempted to call son [name] [phone].  Phone went straight to voicemail.  Voicemail not left on generic message Disposition Plan: Status is: Observation The patient will require care spanning > 2 midnights and should be moved to inpatient because: Possible facial cellulitis on IV antibiotics.  Possible angioedema secondary to ACE inhibitor use.  Anticipate discharge 2/14         Level of care: Telemetry Medical  Consultants:  Nephrology  Procedures:  None  Antimicrobials: Vancomycin Cefepime   Subjective: Seen and examined.  Resting comfortably in bed.  No visible distress.  No pain complaints.  Reports the facial swelling is improving.  Objective: Vitals:   10/05/2021 2137 10/17/21 0433 10/17/21 0558 10/17/21 0800  BP: (!) 189/83 (!) 185/90 (!) 177/90 (!) 194/89  Pulse: 99 93  93  Resp:  18  16  Temp:  98 F (36.7 C)  97.7 F (36.5 C)  TempSrc:  Oral  Oral  SpO2:  97%  97%  Weight:      Height:        Intake/Output Summary (Last 24 hours) at 10/17/2021 1119 Last data filed at 10/19/2021 1818 Gross per 24 hour  Intake 100 ml  Output --  Net 100 ml   Filed Weights   10/22/2021 2134  Weight: 54.3 kg    Examination:  General exam: Appears calm and comfortable  Respiratory system: Lungs clear.  No wheezing/stridor.  Room air.  Normal work of breathing Cardiovascular system: S1-S2, RRR, no murmurs, no  pedal edema Gastrointestinal system: Abdomen is nondistended, soft and nontender. No organomegaly or masses felt. Normal bowel sounds heard. Central nervous system: Alert and oriented. No focal neurological deficits. Extremities: Symmetric 5 x 5 power. Skin: No rashes, lesions or ulcers Psychiatry: Judgement and insight appear normal. Mood & affect  appropriate.     Data Reviewed: I have personally reviewed following labs and imaging studies  CBC: Recent Labs  Lab 10/13/21 1136 10/15/21 2200 10/05/2021 1621 10/17/21 0503  WBC 6.7 5.2 4.0 5.7  NEUTROABS  --  4.6 3.4 5.4  HGB 9.3* 8.2* 9.9* 8.4*  HCT 29.6* 26.4* 32.2* 26.7*  MCV 88.9 89.8 90.4 88.7  PLT 211 246 310 412   Basic Metabolic Panel: Recent Labs  Lab 10/13/21 1136 10/13/21 1651 10/15/21 2200 10/30/2021 1759 10/08/2021 2158 10/17/21 0503  NA 135  --  133* 132*  --  132*  K 3.5  --  3.7 3.9  --  3.6  CL 98  --  98 97*  --  96*  CO2 27  --  24 23  --  23  GLUCOSE 211*  --  180* 255*  --  327*  BUN 37*  --  49* 59*  --  54*  CREATININE 5.76*  --  6.62* 7.71*  --  6.80*  CALCIUM 8.1*  --  7.3* 7.2*  --  7.2*  MG  --  1.8  --   --  1.7  --   PHOS  --   --   --   --   --  5.2*   GFR: Estimated Creatinine Clearance: 5.5 mL/min (A) (by C-G formula based on SCr of 6.8 mg/dL (H)). Liver Function Tests: Recent Labs  Lab 10/15/21 2200 10/12/2021 1759  AST 24 26  ALT 10 10  ALKPHOS 65 59  BILITOT 0.7 0.5  PROT 6.0* 5.9*  ALBUMIN 1.9* 1.9*   No results for input(s): LIPASE, AMYLASE in the last 168 hours. No results for input(s): AMMONIA in the last 168 hours. Coagulation Profile: Recent Labs  Lab 10/15/21 2248 10/15/2021 1759  INR 1.2 1.3*   Cardiac Enzymes: No results for input(s): CKTOTAL, CKMB, CKMBINDEX, TROPONINI in the last 168 hours. BNP (last 3 results) No results for input(s): PROBNP in the last 8760 hours. HbA1C: No results for input(s): HGBA1C in the last 72 hours. CBG: Recent Labs  Lab 10/13/21 2102 10/14/21 0806 10/14/21 0834 10/29/2021 2134 10/17/21 0812  GLUCAP 157* 56* 105* 226* 368*   Lipid Profile: No results for input(s): CHOL, HDL, LDLCALC, TRIG, CHOLHDL, LDLDIRECT in the last 72 hours. Thyroid Function Tests: No results for input(s): TSH, T4TOTAL, FREET4, T3FREE, THYROIDAB in the last 72 hours. Anemia Panel: No results for  input(s): VITAMINB12, FOLATE, FERRITIN, TIBC, IRON, RETICCTPCT in the last 72 hours. Sepsis Labs: Recent Labs  Lab 10/15/21 2200 10/22/2021 0057 10/20/2021 1718 10/26/2021 2158  PROCALCITON  --   --   --  6.86  LATICACIDVEN 1.0 0.7 2.0* 2.2*    Recent Results (from the past 240 hour(s))  Body fluid culture w Gram Stain     Status: None   Collection Time: 10/13/21 12:41 PM   Specimen: Peritoneal Washings; Body Fluid  Result Value Ref Range Status   Specimen Description   Final    PERITONEAL Performed at [facility], [address] [place], [place] [address]    Special Requests   Final    PERITONEAL Performed at [facility], [place], [place]  [address]    Gram Stain   Final    NO SQUAMOUS EPITHELIAL CELLS SEEN FEW WBC SEEN NO ORGANISMS SEEN    Culture   Final    NO GROWTH 3 DAYS Performed at [facility], [address], [place], [place] [address]    Report Status 10/17/2021 FINAL  Final  Resp Panel by RT-PCR (Flu A&B, Covid) Nasopharyngeal Swab     Status: None   Collection Time: 10/13/21  3:23 PM   Specimen: Nasopharyngeal Swab; Nasopharyngeal(NP) swabs in vial transport medium  Result Value Ref Range Status   SARS Coronavirus 2 by RT PCR NEGATIVE NEGATIVE Final    Comment: (NOTE) SARS-CoV-2 target nucleic acids are NOT DETECTED.  The SARS-CoV-2 RNA is generally detectable in upper respiratory specimens during the acute phase of infection. The lowest concentration of SARS-CoV-2 viral copies this assay can detect is 138 copies/mL. A negative result does not preclude SARS-Cov-2 infection and should not be used as the sole basis for treatment or other patient management decisions. A negative result may occur with  improper specimen collection/handling, submission of specimen other than nasopharyngeal swab, presence of viral mutation(s) within the areas targeted by this assay, and inadequate number of viral copies(<138 copies/mL).  A negative result must be combined with clinical observations, patient history, and epidemiological information. The expected result is Negative.  Fact Sheet for Patients:  EntrepreneurPulse.com.au  Fact Sheet for Healthcare Providers:  IncredibleEmployment.be  This test is no t yet approved or cleared by the [place] FDA and  has been authorized for detection and/or diagnosis of SARS-CoV-2 by FDA under an Emergency Use Authorization (EUA). This EUA will remain  in effect (meaning this test can be used) for the duration of the COVID-19 declaration under Section 564(b)(1) of the Act, 21 U.S.C.section 360bbb-3(b)(1), unless the authorization is terminated  or revoked sooner.       Influenza A by PCR NEGATIVE NEGATIVE Final   Influenza B by PCR NEGATIVE NEGATIVE Final    Comment: (NOTE) The Xpert Xpress SARS-CoV-2/FLU/RSV plus assay is intended as an aid in the diagnosis of influenza from Nasopharyngeal swab specimens and should not be used as a sole basis for treatment. Nasal washings and aspirates are unacceptable for Xpert Xpress SARS-CoV-2/FLU/RSV testing.  Fact Sheet for Patients: EntrepreneurPulse.com.au  Fact Sheet for Healthcare Providers: IncredibleEmployment.be  This test is not yet approved or cleared by the [place] FDA and has been authorized for detection and/or diagnosis of SARS-CoV-2 by FDA under an Emergency Use Authorization (EUA). This EUA will remain in effect (meaning this test can be used) for the duration of the COVID-19 declaration under Section 564(b)(1) of the Act, 21 U.S.C. section 360bbb-3(b)(1), unless the authorization is terminated or revoked.  Performed at [facility], [address] [place], [place] [address]   Culture, blood (Routine x 2)     Status: None (Preliminary result)   Collection Time: 10/15/21 10:00 PM   Specimen: BLOOD LEFT WRIST   Result Value Ref Range Status   Specimen Description   Final    BLOOD LEFT WRIST BOTTLES DRAWN AEROBIC AND ANAEROBIC   Special Requests Blood Culture adequate volume  Final   Culture   Final    NO GROWTH 2 DAYS Performed at [facility], [address], [place], [place] [address]    Report Status PENDING  Incomplete  Culture, blood (Routine x 2)     Status: None (Preliminary result)   Collection Time: 10/15/21 10:44 PM  Specimen: BLOOD RIGHT ARM  Result Value Ref Range Status   Specimen Description BLOOD RIGHT ARM  Final   Special Requests   Final    BOTTLES DRAWN AEROBIC AND ANAEROBIC Blood Culture adequate volume   Culture   Final    NO GROWTH 2 DAYS Performed at [facility], [address] [place], [place] [address]    Report Status PENDING  Incomplete  Resp Panel by RT-PCR (Flu A&B, Covid) Nasopharyngeal Swab     Status: None   Collection Time: 10/31/2021  5:18 PM   Specimen: Nasopharyngeal Swab; Nasopharyngeal(NP) swabs in vial transport medium  Result Value Ref Range Status   SARS Coronavirus 2 by RT PCR NEGATIVE NEGATIVE Final    Comment: (NOTE) SARS-CoV-2 target nucleic acids are NOT DETECTED.  The SARS-CoV-2 RNA is generally detectable in upper respiratory specimens during the acute phase of infection. The lowest concentration of SARS-CoV-2 viral copies this assay can detect is 138 copies/mL. A negative result does not preclude SARS-Cov-2 infection and should not be used as the sole basis for treatment or other patient management decisions. A negative result may occur with  improper specimen collection/handling, submission of specimen other than nasopharyngeal swab, presence of viral mutation(s) within the areas targeted by this assay, and inadequate number of viral copies(<138 copies/mL). A negative result must be combined with clinical observations, patient history, and epidemiological information. The expected result is Negative.  Fact Sheet for Patients:   EntrepreneurPulse.com.au  Fact Sheet for Healthcare Providers:  IncredibleEmployment.be  This test is no t yet approved or cleared by the [place] FDA and  has been authorized for detection and/or diagnosis of SARS-CoV-2 by FDA under an Emergency Use Authorization (EUA). This EUA will remain  in effect (meaning this test can be used) for the duration of the COVID-19 declaration under Section 564(b)(1) of the Act, 21 U.S.C.section 360bbb-3(b)(1), unless the authorization is terminated  or revoked sooner.       Influenza A by PCR NEGATIVE NEGATIVE Final   Influenza B by PCR NEGATIVE NEGATIVE Final    Comment: (NOTE) The Xpert Xpress SARS-CoV-2/FLU/RSV plus assay is intended as an aid in the diagnosis of influenza from Nasopharyngeal swab specimens and should not be used as a sole basis for treatment. Nasal washings and aspirates are unacceptable for Xpert Xpress SARS-CoV-2/FLU/RSV testing.  Fact Sheet for Patients: EntrepreneurPulse.com.au  Fact Sheet for Healthcare Providers: IncredibleEmployment.be  This test is not yet approved or cleared by the [place] FDA and has been authorized for detection and/or diagnosis of SARS-CoV-2 by FDA under an Emergency Use Authorization (EUA). This EUA will remain in effect (meaning this test can be used) for the duration of the COVID-19 declaration under Section 564(b)(1) of the Act, 21 U.S.C. section 360bbb-3(b)(1), unless the authorization is terminated or revoked.  Performed at [facility], [address] [place], [place] [address]   Body fluid culture w Gram Stain     Status: None (Preliminary result)   Collection Time: 10/14/2021 11:40 PM   Specimen: Peritoneal Washings; Body Fluid  Result Value Ref Range Status   Specimen Description   Final    PERITONEAL DIALYSIS Performed at [facility], [address] [place],   [address]    Special Requests   Final    NONE Performed at [facility], [place], [place] [address]    Gram Stain   Final    NO SQUAMOUS EPITHELIAL CELLS SEEN NO WBC SEEN NO ORGANISMS  SEEN Performed at [facility], [place] [address], [place], [place] [address]    Culture PENDING  Incomplete   Report Status PENDING  Incomplete         Radiology Studies: CT Soft Tissue Neck Wo Contrast  Result Date: 10/05/2021 CLINICAL DATA:  Right neck/facial swelling, pain, and sore throat. EXAM: CT NECK WITHOUT CONTRAST TECHNIQUE: Multidetector CT imaging of the neck was performed following the standard protocol without intravenous contrast. RADIATION DOSE REDUCTION: This exam was performed according to the departmental dose-optimization program which includes automated exposure control, adjustment of the mA and/or kV according to patient size and/or use of iterative reconstruction technique. COMPARISON:  Chest CTA 10/13/2021. FINDINGS: Pharynx and larynx: Moderate low-density soft tissue swelling at the level of the hypopharynx bilaterally effacing the para form sinuses, new from the recent chest CTA. Mild narrowing of the lower oropharyngeal and supraglottic airway. No retropharyngeal fluid collection. No evidence of significant edema involving the epiglottis or floor of mouth. Salivary glands: No inflammation, mass, or stone. Thyroid: Thyroid nodules measuring up to 1.4 cm as seen on the recent chest CTA and for which no follow-up imaging is recommended. Lymph nodes: No enlarged or suspicious lymph nodes in the neck. Vascular: Calcified atherosclerosis at the carotid bifurcations. Limited intracranial: Proximally 2 cm partially calcified mass along the right sphenoid wing, incompletely imaged. Visualized orbits: Bilateral cataract extraction. Mastoids and visualized paranasal sinuses: Mild mucosal thickening/small mucous retention cysts in the left greater than right maxillary  sinuses. Trace right mastoid fluid. Skeleton: Moderate cervical disc and facet degeneration. Upper chest: Mild biapical pleuroparenchymal lung scarring. Other: Moderate to prominent right lower facial soft tissue swelling. No drainable fluid collection identified within limitations of noncontrast technique and dental streak artifact. IMPRESSION: 1. Right lower facial soft tissue swelling, possibly cellulitis. No drainable fluid collection identified. 2. Moderate hypopharyngeal swelling, new from 88/41/6606 and of uncertain etiology though possibly infectious or allergic. 3. 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 Consider nonemergent contrast enhanced brain MRI for further evaluation. Electronically Signed   By: Logan Bores M.D.   On: 10/23/2021 17:18   DG Chest Port 1 View  Result Date: 10/24/2021 CLINICAL DATA:  Possible sepsis. EXAM: PORTABLE CHEST 1 VIEW COMPARISON:  10/15/2021 FINDINGS: Artifact overlies the chest. Heart size is normal. There is aortic atherosclerosis. The lungs are clear. No heart failure. No acute finding. IMPRESSION: No active disease. Electronically Signed   By: Nelson Chimes M.D.   On: 10/06/2021 16:57   DG Chest Port 1 View  Result Date: 10/15/2021 CLINICAL DATA:  Fever.  Possible sepsis. EXAM: PORTABLE CHEST 1 VIEW COMPARISON:  Chest radiograph dated 10/13/2021. FINDINGS: Minimal left lung base atelectasis similar or improved since the prior radiograph. Trace left pleural effusion. No new consolidation. No pneumothorax. The cardiac silhouette is within limits. Atherosclerotic calcification of the aorta. Osteopenia with degenerative changes of the spine. No acute osseous pathology. Minimal pneumoperitoneum along the right hemidiaphragm, decreased since the prior study. IMPRESSION: 1. Minimal left lung base atelectasis similar or improved since the prior radiograph. Trace left pleural effusion. 2. Minimal residual pneumoperitoneum  under the right hemidiaphragm. Electronically Signed   By: Anner Crete M.D.   On: 10/15/2021 22:18        Scheduled Meds:  cloNIDine  0.1 mg Oral Daily   gentamicin cream  1 application Topical Daily   heparin  5,000 Units Subcutaneous Q8H   hydrALAZINE  100 mg Oral Q8H  insulin aspart  0-5 Units Subcutaneous QHS   insulin aspart  0-6 Units Subcutaneous TID WC   insulin glargine-yfgn  5 Units Subcutaneous Daily   isosorbide mononitrate  30 mg Oral Daily   pantoprazole  40 mg Oral Daily   Continuous Infusions:  ceFEPime (MAXIPIME) IV     dialysis solution 1.5% low-MG/low-CA       LOS: 0 days       Sidney Ace, MD [facility]   If 7PM-7AM, please contact night-coverage  10/17/2021, 11:19 AM

## 2021-10-17 NOTE — Progress Notes (Addendum)
Initial Nutrition Assessment  DOCUMENTATION CODES:   Non-severe (moderate) malnutrition in context of chronic illness  INTERVENTION:   RD will add supplements once pt's diet is advanced  Recommend rena-vit po daily   Pt at high refeed risk; recommend monitor potassium, magnesium and phosphorus labs daily until stable  NUTRITION DIAGNOSIS:   Moderate Malnutrition related to chronic illness (ESRD on PD) as evidenced by mild fat depletion, mild muscle depletion.  GOAL:   Patient will meet greater than or equal to 90% of their needs  MONITOR:   Diet advancement, Labs, Weight trends, Skin, I & O's  REASON FOR ASSESSMENT:   Malnutrition Screening Tool    ASSESSMENT:   81 y/o female with h/o ESRD on PD, DM, HTN and HLD who is admitted with lip swelling and angioedema.  Met with pt in room today. Pt is a poor historian. Pt reports that she is unsure if she was eating well prior to admission. Pt reports that she does not feel hungry today and is unsure if she could eat with her lip swollen. Pt currently NPO per SLP; spoke with MD, plan is for possible diet advancement tomorrow. Pt reports that she is willing to drink protein supplements once her diet is advanced. Pt reports that she takes rena-vit daily at baseline. RD will add supplements and vitamins once her diet is advanced. Pt is likely at refeed risk. Per chart, pt appears weight stable at baseline.   Medications reviewed and include: heparin, insulin, protonix, cefepime, LRS @35ml /hr  Labs reviewed: Na 132(L), K 3.6 wnl, BUN 54(H), creat 6.80(H), P 5.2(H), Mg 1.7 wnl Hgb 8.4(L), Hct 26.7(L) Cbgs- 241, 368 x 24 hrs AIC 5.7(H)- 2/13  NUTRITION - FOCUSED PHYSICAL EXAM:  Flowsheet Row Most Recent Value  Orbital Region No depletion  Upper Arm Region Mild depletion  Thoracic and Lumbar Region Mild depletion  Buccal Region No depletion  Temple Region Mild depletion  Clavicle Bone Region Mild depletion  Clavicle and  Acromion Bone Region Mild depletion  Scapular Bone Region Mild depletion  Dorsal Hand Mild depletion  Patellar Region Severe depletion  Anterior Thigh Region Severe depletion  Posterior Calf Region Severe depletion  Edema (RD Assessment) None  Hair Reviewed  Eyes Reviewed  Mouth Reviewed  Skin Reviewed  Nails Reviewed   Diet Order:   Diet Order             Diet NPO time specified  Diet effective now                  EDUCATION NEEDS:   Education needs have been addressed  Skin:  Skin Assessment: Reviewed RN Assessment (ecchymosis, MASD)  Last BM:  2/11  Height:   Ht Readings from Last 1 Encounters:  10/20/2021 5' 4"  (1.626 m)    Weight:   Wt Readings from Last 1 Encounters:  10/17/21 55.1 kg    Ideal Body Weight:  54.5 kg  BMI:  Body mass index is 20.85 kg/m.  Estimated Nutritional Needs:   Kcal:  1400-1600kcal/day  Protein:  70-80g/day  Fluid:  UOP +1L  Koleen Distance MS, RD, LDN Please refer to [facility] for RD and/or RD on-call/weekend/after hours pager

## 2021-10-17 NOTE — Evaluation (Signed)
Clinical/Bedside Swallow Evaluation Patient Details  Name: Catherine Burke MRN: [id number] Date of Birth: [birth year]  Today's Date: 10/17/2021 Time: SLP Start Time (ACUTE ONLY): 5809 SLP Stop Time (ACUTE ONLY): 9833 SLP Time Calculation (min) (ACUTE ONLY): 50 min  Past Medical History:  Past Medical History:  Diagnosis Date   Anemia    Arthritis    B12 deficiency 04/04/2021   Diabetes mellitus    GERD (gastroesophageal reflux disease)    HLD (hyperlipidemia)    Hypercholesterolemia    Hypertension    Kidney stones    Osteoporosis    Proteinuria    Past Surgical History:  Past Surgical History:  Procedure Laterality Date   ABDOMINAL HYSTERECTOMY     BACK SURGERY     lumbar   BREAST CYST ASPIRATION     unsure of laterality    CAPD INSERTION N/A 02/13/2018   Procedure: LAPAROSCOPIC INSERTION CONTINUOUS AMBULATORY PERITONEAL DIALYSIS  (CAPD) CATHETER;  Surgeon: Algernon Huxley, MD;  Location: [facility] ORS;  Service: Vascular;  Laterality: N/A;   CAPD INSERTION N/A 07/21/2021   Procedure: LAPAROSCOPIC INSERTION CONTINUOUS AMBULATORY PERITONEAL DIALYSIS  (CAPD) CATHETER;  Surgeon: Ronny Bacon, MD;  Location: [facility] ORS;  Service: General;  Laterality: N/A;   CAPD REMOVAL N/A 07/21/2021   Procedure: REMOVAL CONTINUOUS AMBULATORY PERITONEAL DIALYSIS  (CAPD) CATHETER;  Surgeon: Ronny Bacon, MD;  Location: [facility] ORS;  Service: General;  Laterality: N/A;   CHOLECYSTECTOMY     EYE SURGERY     bilateral cataract    NASAL SEPTUM SURGERY     HPI:  Pt  is a 81 y.o. 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  According to the patient and family since yesterday, patient was admitted to [facility] hospital 2 days ago for pneumoperitoneum suspected to be related to peritoneal dialysis, discharged home and then return to the emergency  department at [facility] yesterday for bilateral shoulder pain, mild abdominal pain which was evaluated with no emergent or is new abnormal findings; no pneumonia.  She was discharged home without any interventions and states the orofacial issues have worsened since yesterday.  Per chart review these issues were not noted or evaluated in the emergency department yesterday.  She did have a low-grade fever yesterday per family members but no fever today.  No known dental issues that they are aware of.  She has not been eating or drinking well because of the pain in her swollen mouth/facial area and difficulty swallowing.  Not trying any medications for symptoms thus far.  History of similar issues in the past.     CT of Neck: Right lower facial soft tissue swelling, possibly cellulitis. No  drainable fluid collection identified.  2. Moderate hypopharyngeal swelling, new from 82/50/5397 and of  uncertain etiology though possibly infectious or allergic.   CXR: no active disease.    Assessment / Plan / Recommendation  Clinical Impression  Pt appears to present oropharyngeal phase dysphagia; deficits in oropharyngeal swallowing suspect impacted by oropharyngeal angioedema -- this was discussed w/ MD. Noted results of CT Soft Tissue of Neck which revealed: "Moderate hypopharyngeal swelling, new from 67/34/1937 and of uncertain etiology though possibly infectious or allergic.". Pt exhibited overt labial/facial edema, moreso in R labial area w/ decreased ROM and symmetry. Lingual strength adequate but overall ROM was decreased. Also, pt stated her tongue "hurt" to touch w/ tongue blade. Noted  decreased vocal quality during phonation and cough; apparent decreased contact. Mild velopharyngeal incompetency noted inconsistently.   Pt did state that the edema was "better" than it had been ~2 days ago. Education given on oral care and use of oral swabs frequently for hygiene and stimulation of swallowing; swab kits given.  Pt was independent w/ use.  Pt exhibited a Baseline Throat clear -- suspect in response to own saliva. The throat clearing w/ Cough occurred w/ increased frequency during 3/3 trials of ice chips. Cough efficacy appeared decreased. No further po trials given based on above results and assessment of pt's increased risk for aspiration.  Recommend continue NPO status w/ IV fluid support; MD to f/u w/ steroid tx. ST services recommends frequent oral care for hygiene and stimulation of swallowing as well as lingual/labial movements. Aspiration precautions. Will f/u w/ ongoing assessment tomorrow in hopes to establish least restrictive oral diet. Swab kits provided. MD/NSG updated. SLP Visit Diagnosis: Dysphagia, oropharyngeal phase (R13.12) (suspected angioedema)    Aspiration Risk  Moderate aspiration risk;Risk for inadequate nutrition/hydration    Diet Recommendation  Recommend NPO status currently w/ frequent oral care for hygiene and stimulation of swallowing; aspiration precautions  Medication Administration: Via alternative means    Other  Recommendations Recommended Consults:  (Dietician f/u) Oral Care Recommendations: Oral care QID;Patient independent with oral care (setup) Other Recommendations:  (TBD)    Recommendations for follow up therapy are one component of a multi-disciplinary discharge planning process, led by the attending physician.  Recommendations may be updated based on patient status, additional functional criteria and insurance authorization.  Follow up Recommendations  (TBD)      Assistance Recommended at Discharge Set up Supervision/Assistance  Functional Status Assessment Patient has had a recent decline in their functional status and demonstrates the ability to make significant improvements in function in a reasonable and predictable amount of time.  Frequency and Duration min 3x week  2 weeks       Prognosis Prognosis for Safe Diet Advancement: Fair  (-Good) Barriers to Reach Goals: Time post onset;Severity of deficits Barriers/Prognosis Comment: suspect angioedema      Swallow Study   General Date of Onset: 10/13/2021 HPI: Pt  is a 81 y.o. 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  According to the patient and family since yesterday, patient was admitted to [facility] hospital 2 days ago for pneumoperitoneum suspected to be related to peritoneal dialysis, discharged home and then return to the emergency department at [facility] yesterday for bilateral shoulder pain, mild abdominal pain which was evaluated with no emergent or is new abnormal findings; no pneumonia.  She was discharged home without any interventions and states the orofacial issues have worsened since yesterday.  Per chart review these issues were not noted or evaluated in the emergency department yesterday.  She did have a low-grade fever yesterday per family members but no fever today.  No known dental issues that they are aware of.  She has not been eating or drinking well because of the pain in her swollen mouth/facial area and difficulty swallowing.  Not trying any medications for symptoms thus far.  History of similar issues in the past.   CT of Neck: Right lower facial soft tissue swelling, possibly cellulitis. No  drainable fluid collection identified.  2. Moderate hypopharyngeal swelling, new from 92/42/6834 and of  uncertain etiology though possibly  infectious or allergic.   CXR: no active disease. Type of Study: Bedside Swallow Evaluation Previous Swallow Assessment: none Diet Prior to this Study: NPO Temperature Spikes Noted: No (wbc 5.7) Respiratory Status: Room air History of Recent Intubation: No Behavior/Cognition: Alert;Cooperative;Pleasant mood Oral Cavity Assessment: Edema (lingual, labial; sore to touch  per pt) Oral Care Completed by SLP: Yes Oral Cavity - Dentition: Adequate natural dentition Vision: Functional for self-feeding Self-Feeding Abilities: Able to feed self;Needs set up;Needs assist Patient Positioning: Upright in bed (positioned by SLP) Baseline Vocal Quality: Low vocal intensity (throat clear at baseline) Volitional Cough: Weak (decreased vocal cord contact) Volitional Swallow: Able to elicit    Oral/Motor/Sensory Function Overall Oral Motor/Sensory Function: Moderate impairment Facial ROM: Reduced right Facial Symmetry: Abnormal symmetry right Lingual ROM:  (reduced overall) Lingual Strength:  (adequate) Lingual Sensation: Within Functional Limits Velum: Within Functional Limits Mandible: Within Functional Limits   Ice Chips Ice chips: Impaired Presentation: Spoon (fed; 3 trials) Oral Phase Impairments:  (adequate) Pharyngeal Phase Impairments: Throat Clearing - Immediate;Cough - Immediate (3/3 trials)   Thin Liquid Thin Liquid: Not tested    Nectar Thick Nectar Thick Liquid: Not tested   Honey Thick Honey Thick Liquid: Not tested   Puree Puree: Not tested   Solid     Solid: Not tested        Orinda Kenner, MS, CCC-SLP Speech Language Pathologist Rehab Services; [place] [phone] (ascom) Nalleli Largent 10/17/2021,3:04 PM

## 2021-10-17 NOTE — Care Management Obs Status (Signed)
[place] NOTIFICATION   Patient Details  Name: CLOTEAL ISAACSON MRN: [id number] Date of Birth: [birth year]   Medicare Observation Status Notification Given:  Yes    Candie Chroman, LCSW 10/17/2021, 3:13 PM

## 2021-10-17 NOTE — Progress Notes (Signed)
[facility]  ROUNDING NOTE   Subjective:   [name] was admitted to [facility] on 10/09/2021 for Facial swelling [R22.0] Angioedema, initial encounter [T78.3XXA] Fever, unspecified fever cause [R50.9]  Patient is known to our clinic and receives PD management at [facility], supervised by Dr Juleen China. She was recently admitted for abdominal pain following a PD treatment. She was found to have no signs of peritonitis or infection and was discharged. She returned to ED yesterday with complaints of facial swelling. Reports she noticed this the day after discharge. States she has had no new medications or changes recently. Reports nausea and vomiting the morning swelling was noticed. Febrile. Denies shortness of breath or trouble breathing. States she missed 1-2 PD treatments due to not feeling well.   We have been consulted to manage dialysis needs during this admission.   Objective:  Vital signs in last 24 hours:  Temp:  [97.5 F (36.4 C)-98.5 F (36.9 C)] 98.4 F (36.9 C) (02/13 1123) Pulse Rate:  [83-101] 90 (02/13 1123) Resp:  [16-26] 16 (02/13 0800) BP: (128-199)/(68-90) 128/68 (02/13 1123) SpO2:  [95 %-100 %] 98 % (02/13 1123) Weight:  [54 kg-55.1 kg] 55.1 kg (02/13 1123)  Weight change:  Filed Weights   10/20/2021 2134 10/17/21 1123  Weight: 54.3 kg 55.1 kg    Intake/Output: I/O last 3 completed shifts: In: 100 [IV Piggyback:100] Out: -    Intake/Output this shift:  No intake/output data recorded.  Physical Exam: General: NAD,   Head: Normocephalic, atraumatic. Moist oral mucosal membranes, Upper lip edema  Eyes: Anicteric  Lungs:  Clear to auscultation, normal effort  Heart: Regular rate and rhythm  Abdomen:  Soft, nontender  Extremities:  no peripheral edema.  Neurologic: Nonfocal, moving all four extremities  Skin: No lesions  Access: PD catheter    Basic Metabolic Panel: Recent Labs  Lab 10/13/21 1136 10/13/21 1651 10/15/21 2200  10/27/2021 1759 10/31/2021 2158 10/17/21 0503  NA 135  --  133* 132*  --  132*  K 3.5  --  3.7 3.9  --  3.6  CL 98  --  98 97*  --  96*  CO2 27  --  24 23  --  23  GLUCOSE 211*  --  180* 255*  --  327*  BUN 37*  --  49* 59*  --  54*  CREATININE 5.76*  --  6.62* 7.71*  --  6.80*  CALCIUM 8.1*  --  7.3* 7.2*  --  7.2*  MG  --  1.8  --   --  1.7  --   PHOS  --   --   --   --   --  5.2*     Liver Function Tests: Recent Labs  Lab 10/15/21 2200 10/29/2021 1759  AST 24 26  ALT 10 10  ALKPHOS 65 59  BILITOT 0.7 0.5  PROT 6.0* 5.9*  ALBUMIN 1.9* 1.9*   No results for input(s): LIPASE, AMYLASE in the last 168 hours. No results for input(s): AMMONIA in the last 168 hours.  CBC: Recent Labs  Lab 10/13/21 1136 10/15/21 2200 10/15/2021 1621 10/17/21 0503  WBC 6.7 5.2 4.0 5.7  NEUTROABS  --  4.6 3.4 5.4  HGB 9.3* 8.2* 9.9* 8.4*  HCT 29.6* 26.4* 32.2* 26.7*  MCV 88.9 89.8 90.4 88.7  PLT 211 246 310 266     Cardiac Enzymes: No results for input(s): CKTOTAL, CKMB, CKMBINDEX, TROPONINI in the last 168 hours.  BNP:  Invalid input(s): POCBNP  CBG: Recent Labs  Lab 10/14/21 0806 10/14/21 0834 10/29/2021 2134 10/17/21 0812 10/17/21 1141  GLUCAP 56* 105* 226* 368* 241*    Microbiology: Results for orders placed or performed during the hospital encounter of 10/31/2021  Resp Panel by RT-PCR (Flu A&B, Covid) Nasopharyngeal Swab     Status: None   Collection Time: 10/24/2021  5:18 PM   Specimen: Nasopharyngeal Swab; Nasopharyngeal(NP) swabs in vial transport medium  Result Value Ref Range Status   SARS Coronavirus 2 by RT PCR NEGATIVE NEGATIVE Final    Comment: (NOTE) SARS-CoV-2 target nucleic acids are NOT DETECTED.  The SARS-CoV-2 RNA is generally detectable in upper respiratory specimens during the acute phase of infection. The lowest concentration of SARS-CoV-2 viral copies this assay can detect is 138 copies/mL. A negative result does not preclude SARS-Cov-2 infection and  should not be used as the sole basis for treatment or other patient management decisions. A negative result may occur with  improper specimen collection/handling, submission of specimen other than nasopharyngeal swab, presence of viral mutation(s) within the areas targeted by this assay, and inadequate number of viral copies(<138 copies/mL). A negative result must be combined with clinical observations, patient history, and epidemiological information. The expected result is Negative.  Fact Sheet for Patients:  EntrepreneurPulse.com.au  Fact Sheet for Healthcare Providers:  IncredibleEmployment.be  This test is no t yet approved or cleared by the [place] FDA and  has been authorized for detection and/or diagnosis of SARS-CoV-2 by FDA under an Emergency Use Authorization (EUA). This EUA will remain  in effect (meaning this test can be used) for the duration of the COVID-19 declaration under Section 564(b)(1) of the Act, 21 U.S.C.section 360bbb-3(b)(1), unless the authorization is terminated  or revoked sooner.       Influenza A by PCR NEGATIVE NEGATIVE Final   Influenza B by PCR NEGATIVE NEGATIVE Final    Comment: (NOTE) The Xpert Xpress SARS-CoV-2/FLU/RSV plus assay is intended as an aid in the diagnosis of influenza from Nasopharyngeal swab specimens and should not be used as a sole basis for treatment. Nasal washings and aspirates are unacceptable for Xpert Xpress SARS-CoV-2/FLU/RSV testing.  Fact Sheet for Patients: EntrepreneurPulse.com.au  Fact Sheet for Healthcare Providers: IncredibleEmployment.be  This test is not yet approved or cleared by the [place] FDA and has been authorized for detection and/or diagnosis of SARS-CoV-2 by FDA under an Emergency Use Authorization (EUA). This EUA will remain in effect (meaning this test can be used) for the duration of the COVID-19 declaration  under Section 564(b)(1) of the Act, 21 U.S.C. section 360bbb-3(b)(1), unless the authorization is terminated or revoked.  Performed at [facility], [address] [place], [place] [address]   Body fluid culture w Gram Stain     Status: None (Preliminary result)   Collection Time: 10/09/2021 11:40 PM   Specimen: Peritoneal Washings; Body Fluid  Result Value Ref Range Status   Specimen Description   Final    PERITONEAL DIALYSIS Performed at [facility], [address] [place], [place] [address]    Special Requests   Final    NONE Performed at [facility], [address] [place], [place] [address]    Gram Stain   Final    NO SQUAMOUS EPITHELIAL CELLS SEEN NO WBC SEEN NO ORGANISMS SEEN Performed at [facility], [place] [address], [place], [place] [address]    Culture PENDING  Incomplete   Report Status PENDING  Incomplete  Coagulation Studies: Recent Labs    10/15/21 2248 10/22/2021 1759  LABPROT 15.5* 16.5*  INR 1.2 1.3*    Urinalysis: No results for input(s): COLORURINE, LABSPEC, PHURINE, GLUCOSEU, HGBUR, BILIRUBINUR, KETONESUR, PROTEINUR, UROBILINOGEN, NITRITE, LEUKOCYTESUR in the last 72 hours.  Invalid input(s): APPERANCEUR    Imaging: CT Soft Tissue Neck Wo Contrast  Result Date: 10/15/2021 CLINICAL DATA:  Right neck/facial swelling, pain, and sore throat. EXAM: CT NECK WITHOUT CONTRAST TECHNIQUE: Multidetector CT imaging of the neck was performed following the standard protocol without intravenous contrast. RADIATION DOSE REDUCTION: This exam was performed according to the departmental dose-optimization program which includes automated exposure control, adjustment of the mA and/or kV according to patient size and/or use of iterative reconstruction technique. COMPARISON:  Chest CTA 10/13/2021. FINDINGS: Pharynx and larynx: Moderate low-density soft tissue swelling at the level of the hypopharynx bilaterally effacing the para form  sinuses, new from the recent chest CTA. Mild narrowing of the lower oropharyngeal and supraglottic airway. No retropharyngeal fluid collection. No evidence of significant edema involving the epiglottis or floor of mouth. Salivary glands: No inflammation, mass, or stone. Thyroid: Thyroid nodules measuring up to 1.4 cm as seen on the recent chest CTA and for which no follow-up imaging is recommended. Lymph nodes: No enlarged or suspicious lymph nodes in the neck. Vascular: Calcified atherosclerosis at the carotid bifurcations. Limited intracranial: Proximally 2 cm partially calcified mass along the right sphenoid wing, incompletely imaged. Visualized orbits: Bilateral cataract extraction. Mastoids and visualized paranasal sinuses: Mild mucosal thickening/small mucous retention cysts in the left greater than right maxillary sinuses. Trace right mastoid fluid. Skeleton: Moderate cervical disc and facet degeneration. Upper chest: Mild biapical pleuroparenchymal lung scarring. Other: Moderate to prominent right lower facial soft tissue swelling. No drainable fluid collection identified within limitations of noncontrast technique and dental streak artifact. IMPRESSION: 1. Right lower facial soft tissue swelling, possibly cellulitis. No drainable fluid collection identified. 2. Moderate hypopharyngeal swelling, new from 14/43/1540 and of uncertain etiology though possibly infectious or allergic. 3. 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 Consider nonemergent contrast enhanced brain MRI for further evaluation. Electronically Signed   By: Logan Bores M.D.   On: 10/26/2021 17:18   DG Chest Port 1 View  Result Date: 10/10/2021 CLINICAL DATA:  Possible sepsis. EXAM: PORTABLE CHEST 1 VIEW COMPARISON:  10/15/2021 FINDINGS: Artifact overlies the chest. Heart size is normal. There is aortic atherosclerosis. The lungs are clear. No heart failure. No acute finding. IMPRESSION: No  active disease. Electronically Signed   By: Nelson Chimes M.D.   On: 11/01/2021 16:57   DG Chest Port 1 View  Result Date: 10/15/2021 CLINICAL DATA:  Fever.  Possible sepsis. EXAM: PORTABLE CHEST 1 VIEW COMPARISON:  Chest radiograph dated 10/13/2021. FINDINGS: Minimal left lung base atelectasis similar or improved since the prior radiograph. Trace left pleural effusion. No new consolidation. No pneumothorax. The cardiac silhouette is within limits. Atherosclerotic calcification of the aorta. Osteopenia with degenerative changes of the spine. No acute osseous pathology. Minimal pneumoperitoneum along the right hemidiaphragm, decreased since the prior study. IMPRESSION: 1. Minimal left lung base atelectasis similar or improved since the prior radiograph. Trace left pleural effusion. 2. Minimal residual pneumoperitoneum under the right hemidiaphragm. Electronically Signed   By: Anner Crete M.D.   On: 10/15/2021 22:18     Medications:    ceFEPime (MAXIPIME) IV     dialysis solution 1.5% low-MG/low-CA      cloNIDine  0.1 mg  Oral Daily   gentamicin cream  1 application Topical Daily   heparin  5,000 Units Subcutaneous Q8H   hydrALAZINE  100 mg Oral Q8H   insulin aspart  0-5 Units Subcutaneous QHS   insulin aspart  0-6 Units Subcutaneous TID WC   insulin glargine-yfgn  5 Units Subcutaneous Daily   isosorbide mononitrate  30 mg Oral Daily   pantoprazole  40 mg Oral Daily   acetaminophen **OR** acetaminophen, hydrALAZINE, ondansetron **OR** ondansetron (ZOFRAN) IV  Assessment/ Plan:  [name] is a 81 y.o. white female with end stage renal disease on peritoneal dialysis, diabetes mellitus type II insulin dependent, hypertension, and hyperlipidemia who is admitted to [facility] on 10/17/2021 for Facial swelling [R22.0] Angioedema, initial encounter [T78.3XXA] Fever, unspecified fever cause [R50.9]  [facility] 59kg CCPD 5 exchanges 2 liter fills 9  hours  Hyponatremia with end Stage Renal Disease:  Sodium 132. PD treatment received overnight, tolerated well  Will continue nightly treatments.  Hypertension: 128/68 Resume home medications   Anemia of chronic kidney disease: Hemoglobin 8.4. Mircera as outpatient. Will monitor  4. Angioedema, believed due to prescribed ARB, currently held.    LOS: 0 Nicolemarie Wooley 2/13/202312:00 PM

## 2021-10-18 ENCOUNTER — Observation Stay: Payer: Medicare PPO

## 2021-10-18 DIAGNOSIS — I480 Paroxysmal atrial fibrillation: Secondary | ICD-10-CM | POA: Diagnosis present

## 2021-10-18 DIAGNOSIS — G928 Other toxic encephalopathy: Secondary | ICD-10-CM | POA: Diagnosis not present

## 2021-10-18 DIAGNOSIS — E7800 Pure hypercholesterolemia, unspecified: Secondary | ICD-10-CM | POA: Diagnosis present

## 2021-10-18 DIAGNOSIS — T783XXA Angioneurotic edema, initial encounter: Secondary | ICD-10-CM | POA: Diagnosis not present

## 2021-10-18 DIAGNOSIS — N186 End stage renal disease: Secondary | ICD-10-CM | POA: Diagnosis present

## 2021-10-18 DIAGNOSIS — I120 Hypertensive chronic kidney disease with stage 5 chronic kidney disease or end stage renal disease: Secondary | ICD-10-CM | POA: Diagnosis present

## 2021-10-18 DIAGNOSIS — Z992 Dependence on renal dialysis: Secondary | ICD-10-CM | POA: Diagnosis not present

## 2021-10-18 DIAGNOSIS — T783XXD Angioneurotic edema, subsequent encounter: Secondary | ICD-10-CM | POA: Diagnosis not present

## 2021-10-18 DIAGNOSIS — E1122 Type 2 diabetes mellitus with diabetic chronic kidney disease: Secondary | ICD-10-CM | POA: Diagnosis present

## 2021-10-18 DIAGNOSIS — K148 Other diseases of tongue: Secondary | ICD-10-CM | POA: Diagnosis not present

## 2021-10-18 DIAGNOSIS — M810 Age-related osteoporosis without current pathological fracture: Secondary | ICD-10-CM | POA: Diagnosis present

## 2021-10-18 DIAGNOSIS — Z881 Allergy status to other antibiotic agents status: Secondary | ICD-10-CM | POA: Diagnosis not present

## 2021-10-18 DIAGNOSIS — Z66 Do not resuscitate: Secondary | ICD-10-CM | POA: Diagnosis not present

## 2021-10-18 DIAGNOSIS — Z515 Encounter for palliative care: Secondary | ICD-10-CM | POA: Diagnosis not present

## 2021-10-18 DIAGNOSIS — I959 Hypotension, unspecified: Secondary | ICD-10-CM | POA: Diagnosis not present

## 2021-10-18 DIAGNOSIS — D696 Thrombocytopenia, unspecified: Secondary | ICD-10-CM | POA: Diagnosis present

## 2021-10-18 DIAGNOSIS — Z20822 Contact with and (suspected) exposure to covid-19: Secondary | ICD-10-CM | POA: Diagnosis present

## 2021-10-18 DIAGNOSIS — N179 Acute kidney failure, unspecified: Secondary | ICD-10-CM | POA: Diagnosis present

## 2021-10-18 DIAGNOSIS — I4891 Unspecified atrial fibrillation: Secondary | ICD-10-CM | POA: Diagnosis not present

## 2021-10-18 DIAGNOSIS — D631 Anemia in chronic kidney disease: Secondary | ICD-10-CM | POA: Diagnosis present

## 2021-10-18 DIAGNOSIS — J9602 Acute respiratory failure with hypercapnia: Secondary | ICD-10-CM | POA: Diagnosis not present

## 2021-10-18 DIAGNOSIS — K219 Gastro-esophageal reflux disease without esophagitis: Secondary | ICD-10-CM | POA: Diagnosis present

## 2021-10-18 DIAGNOSIS — R220 Localized swelling, mass and lump, head: Secondary | ICD-10-CM | POA: Diagnosis present

## 2021-10-18 DIAGNOSIS — E440 Moderate protein-calorie malnutrition: Secondary | ICD-10-CM | POA: Diagnosis present

## 2021-10-18 DIAGNOSIS — N2581 Secondary hyperparathyroidism of renal origin: Secondary | ICD-10-CM | POA: Diagnosis present

## 2021-10-18 DIAGNOSIS — R0902 Hypoxemia: Secondary | ICD-10-CM | POA: Diagnosis not present

## 2021-10-18 DIAGNOSIS — E871 Hypo-osmolality and hyponatremia: Secondary | ICD-10-CM | POA: Diagnosis present

## 2021-10-18 DIAGNOSIS — N184 Chronic kidney disease, stage 4 (severe): Secondary | ICD-10-CM | POA: Diagnosis not present

## 2021-10-18 DIAGNOSIS — E11649 Type 2 diabetes mellitus with hypoglycemia without coma: Secondary | ICD-10-CM | POA: Diagnosis not present

## 2021-10-18 DIAGNOSIS — J9601 Acute respiratory failure with hypoxia: Secondary | ICD-10-CM | POA: Diagnosis not present

## 2021-10-18 DIAGNOSIS — Z7189 Other specified counseling: Secondary | ICD-10-CM | POA: Diagnosis not present

## 2021-10-18 LAB — GLUCOSE, CAPILLARY
Glucose-Capillary: 238 mg/dL — ABNORMAL HIGH (ref 70–99)
Glucose-Capillary: 164 mg/dL — ABNORMAL HIGH (ref 70–99)
Glucose-Capillary: 164 mg/dL — ABNORMAL HIGH (ref 70–99)
Glucose-Capillary: 160 mg/dL — ABNORMAL HIGH (ref 70–99)
Glucose-Capillary: 192 mg/dL — ABNORMAL HIGH (ref 70–99)

## 2021-10-18 LAB — MRSA NEXT GEN BY PCR, NASAL: MRSA by PCR Next Gen: NOT DETECTED

## 2021-10-18 LAB — VANCOMYCIN, RANDOM: Vancomycin Rm: 14

## 2021-10-18 MED ORDER — METHYLPREDNISOLONE SODIUM SUCC 125 MG IJ SOLR
80.0000 mg | Freq: Once | INTRAMUSCULAR | Status: AC
  Administered 2021-10-18: 80 mg via INTRAVENOUS
  Filled 2021-10-18: qty 2

## 2021-10-18 MED ORDER — DIPHENHYDRAMINE HCL 50 MG/ML IJ SOLN
12.5000 mg | Freq: Four times a day (QID) | INTRAMUSCULAR | Status: DC | PRN
  Administered 2021-10-18 – 2021-10-19 (×2): 12.5 mg via INTRAVENOUS
  Filled 2021-10-18 (×2): qty 1

## 2021-10-18 MED ORDER — DEXAMETHASONE SODIUM PHOSPHATE 10 MG/ML IJ SOLN
6.0000 mg | Freq: Four times a day (QID) | INTRAMUSCULAR | Status: DC
  Administered 2021-10-18 – 2021-10-19 (×5): 6 mg via INTRAVENOUS
  Filled 2021-10-18 (×8): qty 0.6

## 2021-10-18 MED ORDER — ISOSORBIDE MONONITRATE ER 30 MG PO TB24: 60.0000 mg | ORAL_TABLET | Freq: Every day | ORAL | Status: DC

## 2021-10-18 MED ORDER — DEXAMETHASONE SODIUM PHOSPHATE 10 MG/ML IJ SOLN
10.0000 mg | Freq: Once | INTRAMUSCULAR | Status: AC
  Administered 2021-10-18: 10 mg via INTRAVENOUS
  Filled 2021-10-18: qty 1

## 2021-10-18 MED ORDER — DIPHENHYDRAMINE HCL 50 MG/ML IJ SOLN
25.0000 mg | Freq: Once | INTRAMUSCULAR | Status: AC
  Administered 2021-10-18: 25 mg via INTRAVENOUS
  Filled 2021-10-18: qty 1

## 2021-10-18 MED ORDER — HEPARIN SODIUM (PORCINE) 1000 UNIT/ML IJ SOLN
INTRAMUSCULAR | Status: AC
  Administered 2021-10-18: 6000 [IU] via INTRAPERITONEAL
  Filled 2021-10-18: qty 10

## 2021-10-18 MED ORDER — FAMOTIDINE IN NACL 20-0.9 MG/50ML-% IV SOLN
20.0000 mg | Freq: Once | INTRAVENOUS | Status: AC
  Administered 2021-10-18: 20 mg via INTRAVENOUS
  Filled 2021-10-18: qty 50

## 2021-10-18 MED ORDER — CLONIDINE HCL 0.2 MG/24HR TD PTWK
0.2000 mg | MEDICATED_PATCH | TRANSDERMAL | Status: DC
  Administered 2021-10-18 – 2021-11-01 (×3): 0.2 mg via TRANSDERMAL
  Filled 2021-10-18 (×3): qty 1

## 2021-10-18 MED ORDER — LORAZEPAM 2 MG/ML IJ SOLN
0.5000 mg | Freq: Four times a day (QID) | INTRAMUSCULAR | Status: DC | PRN
  Administered 2021-10-19: 0.5 mg via INTRAVENOUS
  Filled 2021-10-18: qty 1

## 2021-10-18 MED ORDER — CHLORHEXIDINE GLUCONATE CLOTH 2 % EX PADS
6.0000 | MEDICATED_PAD | Freq: Every day | CUTANEOUS | Status: DC
  Administered 2021-10-18 – 2021-11-03 (×16): 6 via TOPICAL

## 2021-10-18 MED ORDER — EPINEPHRINE 0.3 MG/0.3ML IJ SOAJ
0.3000 mg | Freq: Once | INTRAMUSCULAR | Status: AC
  Administered 2021-10-18: 0.3 mg via INTRAMUSCULAR
  Filled 2021-10-18: qty 0.3

## 2021-10-18 MED ORDER — METHYLPREDNISOLONE SODIUM SUCC 40 MG IJ SOLR
40.0000 mg | Freq: Once | INTRAMUSCULAR | Status: AC
  Administered 2021-10-18: 40 mg via INTRAVENOUS
  Filled 2021-10-18: qty 1

## 2021-10-18 MED ORDER — ACETAMINOPHEN 10 MG/ML IV SOLN
650.0000 mg | Freq: Once | INTRAVENOUS | Status: AC
  Administered 2021-10-18: 650 mg via INTRAVENOUS
  Filled 2021-10-18: qty 65

## 2021-10-18 MED ORDER — VANCOMYCIN VARIABLE DOSE PER UNSTABLE RENAL FUNCTION (PHARMACIST DOSING): Status: DC

## 2021-10-18 MED ORDER — METHYLPREDNISOLONE SODIUM SUCC 125 MG IJ SOLR
60.0000 mg | Freq: Once | INTRAMUSCULAR | Status: AC
  Administered 2021-10-18: 60 mg via INTRAVENOUS
  Filled 2021-10-18: qty 2

## 2021-10-18 MED ORDER — HYDRALAZINE HCL 20 MG/ML IJ SOLN: 10.0000 mg | Freq: Four times a day (QID) | INTRAMUSCULAR | Status: DC | PRN

## 2021-10-18 NOTE — Progress Notes (Signed)
PD initiated. Site no infection, PD catheter intact. Cycler went into first fill without issue.

## 2021-10-18 NOTE — Progress Notes (Addendum)
Speech Language Pathology Treatment: Dysphagia  Patient Details Name: DERRIKA RUFFALO MRN: [id number] DOB: December 04, 1940 Today's Date: 10/18/2021 Time: 5732-2025 SLP Time Calculation (min) (ACUTE ONLY): 30 min  Assessment / Plan / Recommendation Clinical Impression  Pt seen today for ongoing assessment of readiness to initiate po's in hopes to establish an oral diet. Family member present. Pt was resting in bed; more upright d/t discomfort breathing if lying reclined. Pt was restless and tense and uncomfortable w/ any covers on her legs. NSG aware of her discomfort. MD stated earlier she was having "more" difficulty w/ the angioedema and breathing today.   Pt presented w/ lingual elevation to the roof of her mouth upon presentation at bedside. She was unable to stick out her tongue as she did yesterday. Noted mild throat clearing w/ (suspected) saliva. She was unable to talk -- little to no lingual movements appreciated. Noted reduced facial/buccal swelling but increased upper lip edema and lingual turgor in an elevation position to the palate(at rest). Little to no lingual movements appreciated. Oral care provided for stimulation, hygiene, w/ instruction to pt/family to continue for stimulation/swallowing. Pt's presentation is a decline from yesterday; this was shared w/ the MD.   Recommend strict NPO status. Discussed and educated family member and pt on oral care as often as pt can tolerate during the day in order to provide hygiene and stimulation of lingual/labial movements and swallowing. Discussed monitoring for s/s of aspiration during, and especially to squeeze oral swabs of any liquid. ST services will continue to follow pt's status for readiness for po trials next 1-3 days. Discussed w/ MD afterwards.       HPI HPI: Pt  is a 81 y.o. 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  According to the patient and family since yesterday, patient was admitted to [facility] hospital 2 days ago for pneumoperitoneum suspected to be related to peritoneal dialysis, discharged home and then return to the emergency department at [facility] yesterday for bilateral shoulder pain, mild abdominal pain which was evaluated with no emergent or is new abnormal findings; no pneumonia.  She was discharged home without any interventions and states the orofacial issues have worsened since yesterday.  Per chart review these issues were not noted or evaluated in the emergency department yesterday.  She did have a low-grade fever yesterday per family members but no fever today.  No known dental issues that they are aware of.  She has not been eating or drinking well because of the pain in her swollen mouth/facial area and difficulty swallowing.  Not trying any medications for symptoms thus far.  History of similar issues in the past.   CT of Neck: Right lower facial soft tissue swelling, possibly cellulitis. No  drainable fluid collection identified.  2. Moderate hypopharyngeal swelling, new from 42/70/6237 and of  uncertain etiology though possibly infectious or allergic.   CXR: no active disease.      SLP Plan  Continue with current plan of care      Recommendations for follow up therapy are one component of a multi-disciplinary discharge planning process, led by the attending physician.  Recommendations may be updated based on patient status, additional functional criteria and insurance authorization.    Recommendations  Diet recommendations: NPO                Oral Care Recommendations: Oral  care QID;Staff/trained caregiver to provide oral care (pt to assist if able) Follow Up Recommendations: Skilled nursing-short term rehab (<3 hours/day) (TBD) Assistance recommended at discharge: Intermittent Supervision/Assistance SLP Visit Diagnosis:  Dysphagia, oropharyngeal phase (R13.12) (suspected angioedema) Plan: Continue with current plan of care            Orinda Kenner, [place], [place]; [place] [phone] (ascom) Jamien Casanova  10/18/2021, 4:44 PM

## 2021-10-18 NOTE — Progress Notes (Signed)
Patient requested this nurse to the bedside due to difficulty breathing. Upon entering the room, the patient is immediately repositioned from semi- fowlers to high fowlers to assist with breathing. Patient did not objectively appear in respiratory distress as no accessory muscles are used, no additional effort to breath are present. O2 is WNL at 98% on room air, lung sounds, and tongue remains at baseline. Oral care is provided for moisture by this nurse. This nurse remains at bedside approximately 10-15 mins to observe the patients respiratory status. Patient is noted with wheezing and more anxious, however her oxygen saturation remained WNL. 1L of o2 via [place] is given for comfort and the patient is less anxious for approximately 15 minutes before she began to get aroused again.  Neomia Glass, NP is notified and she immediately comes to perform a bedside assessment. Bed side assessment resulted in the provider rendering new orders. Benadryl 25mg  IV and solumedrol 60mg  IV are administered per provider orders.

## 2021-10-18 NOTE — Progress Notes (Signed)
Patient is more relaxed at this time. Facial (top right lip) swelling has improved, however the tongue remains swollen at baseline. No additional respiratory efforts are present as patient is in bed watching tv and equal rise and fall of the chest is noted. Spo2 is 96%. Patient states " I do not want to be alone, please stay". This nurse allows a few moments to pass and observe the patient drifting to sleep. Once the patient is seen to be resting, this nurse exist the room. Once the patient is aware that she is alone, she began to cry and this nurse offers one to one bedside care. Patient refuses to be alone and began to cry again. I informed the patient that crying may cause her nasal cavity to constrict making it more difficult to breath. Patient is given an opportunity to participate in deep breathing techniques. These efforts are ineffective as the patient will not allow staff to leave without being tearful. This nurse remains at bedside until the patient is asleep. At this time, the patient is resting and does not present with any signs of respiratory distress.

## 2021-10-18 NOTE — Progress Notes (Signed)
Patient's facial swelling continued to get worse. Patient's LOC has declined as well. Notified MD, and he recommended transfer to ICU bed 8. Report given to Raquel Sarna, RN.

## 2021-10-18 NOTE — Progress Notes (Signed)
° °      CROSS COVER NOTE  NAME: Catherine Burke MRN: [id number] DOB : [birth year]   Secure chat received from RN stating patient reports difficulty breathing.   On my assessment patient is not labored, no accessory muscle use, oxygen saturation 96% on 1L [place] that nursing placed for comfort. Patient's still has right sided facial swelling as well as tongue swelling. I can visualize the top of her uvula only. Discussed with admitting physician that saw her last night and facial/tongue assessment is unchanged.  Benadryl and methylprednisolone ordered.  Neomia Glass MHA, MSN, FNP-BC Nurse Practitioner [facility] Pager [phone]

## 2021-10-18 NOTE — Significant Event (Signed)
Worsening face tongue and lip edema noted this morning.  Progressing despite treatment with steroids, Benadryl, famotidine  Plan: Case discussed with intensivist We will moved to ICU Decadron 6 mg every 6 hours IV Remain n.p.o. Telemetry monitoring  Continuous pulse ox  Ralene Muskrat MD

## 2021-10-18 NOTE — Consult Note (Signed)
NAME:  Catherine Burke, MRN:  [id number], DOB:  May 13, 1941, LOS: 0 ADMISSION DATE:  10/27/2021, CONSULTATION DATE:  10/18/21 REFERRING MD:  Hospitalist, CHIEF COMPLAINT:  Angioedema   History of Present Illness:  81 year old female with a past medical history of end-stage renal disease on peritoneal dialysis, hypertension, hyperlipidemia and insulin-dependent diabetes mellitus who presented to the emergency room on 2/12 with right facial swelling.  There was initial concern for sepsis due to fever and elevated lactate of 2.  A CT soft tissue of neck without contrast was done which revealed right lower facial soft swelling and possible cellulitis.  No drainable fluid collection.  Per the medical record, the lip started swelling on 10/15/2021. No changes to medicationns.  She received IV steroids and was started on vancomycin and cefepime.  There was reports of ACE inhibitor use which was discontinued.  She was admitted to the hospitalist service for observation and monitoring.  This morning 2/14, patient with worsening face, tongue and lip swelling.  Swelling is progressing despite treatment with steroids, Benadryl and Pepcid.  The decision was made to move the patient to the ICU and critical care was consulted.  Patient seen and examined while still on the floor.  She is alert and oriented x3.  At the time of our evaluation, patient was laying flat in bed with mild respiratory distress and saying it was hard to breathe.  Tongue very swollen.  Very mild stridor on auscultation.  Set patient up in bed and patient was able to suction oral airway.  At this time, there is no need for emergent intubation however patient does require closer monitoring and will therefore be moved to the ICU.  Pertinent  Medical History  Hypertension End-stage renal disease on PD Diabetes mellitus GERD B12 deficiency Hyperlipidemia Kidney stones  Significant Hospital Events: Including procedures, antibiotic start and stop  dates in addition to other pertinent events   2/12: Admitted to the hospitalist service for facial swelling  2/14: Transferred to ICU due to progression of swelling despite steroids, benadryl and pepcid  Interim History / Subjective:  81yo female w/a past medical history of hypertension on ace inhibitor at home who presented 2/12 with facial edema concerning for cellulitis vs angioedema. Swelling progressed despite treatment requiring transfer to ICU on 2/14  Objective   Blood pressure (!) 181/82, pulse 100, temperature 97.6 F (36.4 C), temperature source Oral, resp. rate 16, height 5\' 4"  (1.626 m), weight 55.6 kg, SpO2 99 %.        Intake/Output Summary (Last 24 hours) at 10/18/2021 1322 Last data filed at 10/18/2021 0830 Gross per 24 hour  Intake 10155.95 ml  Output 10366 ml  Net -210.05 ml   Filed Weights   10/17/21 1123 10/17/21 1804 10/18/21 0500  Weight: 55.1 kg 55.9 kg 55.6 kg    Examination: General: Lethargic but easily arousable, mild distress HENT: normocephalic Lungs: Lung sounds coarse, very mild stridor appreciated on ausculation of neck, equal chest rise Cardiovascular: heart sounds S1S2, no edema, warm and palpable pulses Abdomen: Soft, non-tender, non-distended Extremities: No deformities, no rash Neuro: Non-focal   Resolved Hospital Problem list   NA  Assessment & Plan:  Angioedema --Ace Inhibitor discontinued --Continue steroids: Dex 6mg  IV q6hour --Keep NPO --Patient has agreed to intubation if needed for airway protection, at this time protecting airway --Received Benadryl and Pepcid  Essential Hypertension --Continue Clonidine, Hydralazine and isosorbide mononitrate --PRN IV hydralazine  ESRD on peritoneal dialysis --Nephrology consulted --Patient does PD nightly  but has missed two nights due to ED visits --BMP from 2/13 reviewed, lytes stable --BMP daily  Diabetes Mellitus --SSI  Facial Cellulitis --Felt to be  unlikely --Vancomycin/Cefepime d/c'd today --Blood cultures NGTD  Best Practice (right click and "Reselect all SmartList Selections" daily)   Diet/type: NPO DVT prophylaxis: prophylactic heparin  GI prophylaxis: N/A Lines: N/A Foley:  N/A Code Status:  limited Last date of multidisciplinary goals of care discussion [NA]  Labs   CBC: Recent Labs  Lab 10/13/21 1136 10/15/21 2200 10/22/2021 1621 10/17/21 0503  WBC 6.7 5.2 4.0 5.7  NEUTROABS  --  4.6 3.4 5.4  HGB 9.3* 8.2* 9.9* 8.4*  HCT 29.6* 26.4* 32.2* 26.7*  MCV 88.9 89.8 90.4 88.7  PLT 211 246 310 841    Basic Metabolic Panel: Recent Labs  Lab 10/13/21 1136 10/13/21 1651 10/15/21 2200 10/29/2021 1759 10/19/2021 2158 10/17/21 0503  NA 135  --  133* 132*  --  132*  K 3.5  --  3.7 3.9  --  3.6  CL 98  --  98 97*  --  96*  CO2 27  --  24 23  --  23  GLUCOSE 211*  --  180* 255*  --  327*  BUN 37*  --  49* 59*  --  54*  CREATININE 5.76*  --  6.62* 7.71*  --  6.80*  CALCIUM 8.1*  --  7.3* 7.2*  --  7.2*  MG  --  1.8  --   --  1.7  --   PHOS  --   --   --   --   --  5.2*   GFR: Estimated Creatinine Clearance: 5.5 mL/min (A) (by C-G formula based on SCr of 6.8 mg/dL (H)). Recent Labs  Lab 10/13/21 1136 10/15/21 2200 10/27/2021 0057 10/24/2021 1621 10/14/2021 1718 10/12/2021 2158 10/17/21 0503  PROCALCITON  --   --   --   --   --  6.86  --   WBC 6.7 5.2  --  4.0  --   --  5.7  LATICACIDVEN  --  1.0 0.7  --  2.0* 2.2*  --     Liver Function Tests: Recent Labs  Lab 10/15/21 2200 10/10/2021 1759  AST 24 26  ALT 10 10  ALKPHOS 65 59  BILITOT 0.7 0.5  PROT 6.0* 5.9*  ALBUMIN 1.9* 1.9*   No results for input(s): LIPASE, AMYLASE in the last 168 hours. No results for input(s): AMMONIA in the last 168 hours.  ABG No results found for: PHART, PCO2ART, PO2ART, HCO3, TCO2, ACIDBASEDEF, O2SAT   Coagulation Profile: Recent Labs  Lab 10/15/21 2248 11/01/2021 1759  INR 1.2 1.3*    Cardiac Enzymes: No results for  input(s): CKTOTAL, CKMB, CKMBINDEX, TROPONINI in the last 168 hours.  HbA1C: Hgb A1C (fingerstick)  Date/Time Value Ref Range Status  02/24/2015 08:53 AM 13.0 (H) <5.7 % Final    Comment:                                                                           According to the ADA Clinical Practice Recommendations for 2011, when HbA1c is used as a screening test:     >=6.5%  Diagnostic of Diabetes Mellitus            (if abnormal result is confirmed)   5.7-6.4%   Increased risk of developing Diabetes Mellitus   References:Diagnosis and Classification of Diabetes Mellitus,Diabetes GXQJ,1941,74(YCXKG 1):S62-S69 and Standards of Medical Care in         Diabetes - 2011,Diabetes Care,2011,34 (Suppl 1):S11-S61.      Hgb A1c MFr Bld  Date/Time Value Ref Range Status  10/17/2021 05:03 AM 5.7 (H) 4.8 - 5.6 % Final    Comment:    (NOTE) Pre diabetes:          5.7%-6.4%  Diabetes:              >6.4%  Glycemic control for   <7.0% adults with diabetes   07/20/2021 03:19 PM 6.1 (H) 4.8 - 5.6 % Final    Comment:    (NOTE) Pre diabetes:          5.7%-6.4%  Diabetes:              >6.4%  Glycemic control for   <7.0% adults with diabetes     CBG: Recent Labs  Lab 10/17/21 1141 10/17/21 1732 10/17/21 2042 10/18/21 0741 10/18/21 1141  GLUCAP 241* 169* 257* 238* 164*    Review of Systems:   Negative unless stated in HPI  Past Medical History:  She,  has a past medical history of Anemia, Arthritis, B12 deficiency (04/04/2021), Diabetes mellitus, GERD (gastroesophageal reflux disease), HLD (hyperlipidemia), Hypercholesterolemia, Hypertension, Kidney stones, Osteoporosis, and Proteinuria.   Surgical History:   Past Surgical History:  Procedure Laterality Date   ABDOMINAL HYSTERECTOMY     BACK SURGERY     lumbar   BREAST CYST ASPIRATION     unsure of laterality    CAPD INSERTION N/A 02/13/2018   Procedure: LAPAROSCOPIC INSERTION CONTINUOUS AMBULATORY PERITONEAL DIALYSIS   (CAPD) CATHETER;  Surgeon: Algernon Huxley, MD;  Location: [facility] ORS;  Service: Vascular;  Laterality: N/A;   CAPD INSERTION N/A 07/21/2021   Procedure: LAPAROSCOPIC INSERTION CONTINUOUS AMBULATORY PERITONEAL DIALYSIS  (CAPD) CATHETER;  Surgeon: Ronny Bacon, MD;  Location: [facility] ORS;  Service: General;  Laterality: N/A;   CAPD REMOVAL N/A 07/21/2021   Procedure: REMOVAL CONTINUOUS AMBULATORY PERITONEAL DIALYSIS  (CAPD) CATHETER;  Surgeon: Ronny Bacon, MD;  Location: [facility] ORS;  Service: General;  Laterality: N/A;   CHOLECYSTECTOMY     EYE SURGERY     bilateral cataract    NASAL SEPTUM SURGERY       Social History:   reports that she has never smoked. She has never used smokeless tobacco. She reports that she does not drink alcohol and does not use drugs.   Family History:  Her family history includes Breast cancer in her paternal aunt; Diabetes in her maternal grandmother; Heart attack in her father; Other in her mother.   Allergies Allergies  Allergen Reactions   Ace Inhibitors Swelling   Levofloxacin Other (See Comments)    Throat Swelling    Penicillins Anaphylaxis   Other    Penicillin G Other (See Comments)   Sulfa Antibiotics      Home Medications  Prior to Admission medications   Medication Sig Start Date End Date Taking? Authorizing Provider  atorvastatin (LIPITOR) 10 MG tablet Take 10 mg by mouth daily. 07/28/20  Yes [provider]  B Complex-C-Biotin-E-Min-FA (DIALYVITE 5000) 5 MG TABS Take 1 tablet by mouth daily. 10/27/20  Yes [provider]  cloNIDine (CATAPRES) 0.1 MG tablet Take  by mouth. 07/19/20  Yes [provider]  insulin glargine (LANTUS SOLOSTAR) 100 UNIT/ML Solostar Pen Inject 5 Units into the skin at bedtime. 10/14/21  Yes Aline August, MD  isosorbide mononitrate (IMDUR) 30 MG 24 hr tablet Take 30 mg by mouth daily. 08/12/20  Yes [provider]  Magnesium 400 MG CAPS Take 1 tablet by mouth daily. 08/31/21  Yes  [provider]  pantoprazole (PROTONIX) 40 MG tablet Take 1 tablet (40 mg total) by mouth daily. 10/14/21  Yes Aline August, MD  senna (SENOKOT) 8.6 MG tablet Take 2 tablets by mouth daily.   Yes [provider]  telmisartan (MICARDIS) 40 MG tablet Take by mouth. 06/21/21  Yes [provider]  Vitamin D, Ergocalciferol, (DRISDOL) 1.25 MG (50000 UNIT) CAPS capsule Take 50,000 Units by mouth once a week. 06/21/21  Yes [provider]  ACCU-CHEK AVIVA PLUS test strip TESTING TWICE DAILY. 08/11/14   Susy Frizzle, MD  Acetaminophen (TYLENOL) 325 MG CAPS Take 1 capsule by mouth daily as needed (pain).    [provider]  clindamycin (CLEOCIN) 300 MG capsule Take 1 capsule (300 mg total) by mouth 2 (two) times daily. 10/14/2021   Volney American, PA-C  hydrALAZINE (APRESOLINE) 100 MG tablet Take 1 tablet (100 mg total) by mouth every 8 (eight) hours. 10/13/17 07/21/20  Henreitta Leber, MD  Insulin Pen Needle 31G X 5 MM MISC As directed 03/31/15   Alycia Rossetti, MD  ondansetron (ZOFRAN) 4 MG tablet Take 1 tablet (4 mg total) by mouth every 6 (six) hours as needed for nausea. 10/14/21   Aline August, MD  spironolactone (ALDACTONE) 25 MG tablet Take 1 tablet (25 mg total) by mouth daily. 10/14/17 07/21/20  Henreitta Leber, MD  torsemide (DEMADEX) 100 MG tablet Take 100 mg by mouth daily as needed. 04/25/21   [provider]     Critical care time: 51 minutes         Tonye Royalty ACNP-BC

## 2021-10-18 NOTE — Progress Notes (Signed)
PROGRESS NOTE    Catherine Burke  VFI:[id number] DOB: [birth year] DOA: 10/17/2021 PCP: Idelle Crouch, MD    Brief Narrative:  81 year old with medical history of end-stage renal disease on peritoneal dialysis, hypertension, hyperlipidemia, insulin-dependent diabetes mellitus, who presents emergency department for chief concerns of right facial swelling.   Vitals in the emergency department show temperature of 97.8 and improved to 98.5, respiration rate of 20, heart rate of 93, blood pressure 170/69, SPO2 98% on room air.  Patient is remained hemodynamically stable.  Facial swelling is improved.  Likely etiology is angioedema secondary to ACE inhibitor use.  ACE inhibitor's been discontinued and added to allergy list.  2/14: Called to bedside this morning.  Patient has had worsening of her facial and tongue swelling.  Continues to protect airway.  On 1 2 L nasal cannula.  Some coarse breath sounds but no wheezing or stridor.   Assessment & Plan:   Principal Problem:   Angioedema, initial encounter Active Problems:   Hyperlipidemia   Essential hypertension   GERD (gastroesophageal reflux disease)   Chronic kidney disease (CKD), stage IV (severe) (HCC)   ESRD (end stage renal disease) ([place])   Facial cellulitis   Malnutrition of moderate degree  * Angioedema, subsequent encounter Assessment & Plan Patient had worsening of her angioedema symptoms on 2/14.  Continues to protect airway.  No wheezing or stridor.  Coarse breath sounds noted Plan: - Continue holding ARB, added to allergy list -Solu-Medrol 125 mg x 1, Benadryl 25 mg x 1, Pepcid 20 mg IV x1 -EpiPen x1 -Continue n.p.o. -Continuous pulse oximetry -Close monitoring of vital signs -Low threshold for transfer to higher level of care   Essential hypertension Assessment & Plan Poor control over interval -DC ARB - Resumed home clonidine 0.1 mg daily --Resume hydralazine 100 mg p.o. 3 times daily - Resumed isosorbide  mononitrate 30 mg daily -IV hydralazine as needed If concerned about airway compromise can hold p.o. medications and treat as needed with IV medication   ESRD on peritoneal dialysis (end stage renal disease) (HCC) Assessment & Plan - Currently on peritoneal dialysis nightly - Nephrology has been consulted for peritoneal dialysis resumption, with notes that patient has missed 2 nights of dialysis due to ED visits   Facial cellulitis Assessment & Plan Facial cellulitis felt unlikely - DC antibiotics - Follow-up with blood cultures, no growth to date    DVT prophylaxis: SQ heparin Code Status: DNR Family Communication: Daughter at bedside 2/14 Status is: Observation The patient will require care spanning > 2 midnights and should be moved to inpatient because: Acute angioedema secondary to ACE inhibitor presumably.  Disposition plan pending   Level of care: Telemetry Medical  Consultants:  Nephrology  Procedures:  None  Antimicrobials:   Subjective: Seen and examined.  Appears anxious.  Tongue and lips are swollen.  Objective: Vitals:   10/18/21 0142 10/18/21 0500 10/18/21 0540 10/18/21 0742  BP:   (!) 163/72 (!) 181/82  Pulse: (!) 101  (!) 110 100  Resp:   16 16  Temp:   97.6 F (36.4 C) 97.6 F (36.4 C)  TempSrc:    Oral  SpO2: 100%  100% 99%  Weight:  55.6 kg    Height:        Intake/Output Summary (Last 24 hours) at 10/18/2021 1054 Last data filed at 10/18/2021 0830 Gross per 24 hour  Intake 21155.95 ml  Output 30198 ml  Net -9042.05 ml   [employer]  10/17/21 1123 10/17/21 1804 10/18/21 0500  Weight: 55.1 kg 55.9 kg 55.6 kg    Examination:  General exam: Appears calm and comfortable  ENT: Tongue and lip swollen Respiratory system: Coarse breath sounds bilaterally.  No wheezing or stridor Cardiovascular system: S1-S2, RRR, no murmurs, no pedal edema Gastrointestinal system: Abdomen is nondistended, soft and nontender. No organomegaly or masses  felt. Normal bowel sounds heard. Central nervous system: Alert and oriented. No focal neurological deficits. Extremities: Symmetric 5 x 5 power. Skin: No rashes, lesions or ulcers Psychiatry: Judgement and insight appear normal. Mood & affect appropriate.     Data Reviewed: I have personally reviewed following labs and imaging studies  CBC: Recent Labs  Lab 10/13/21 1136 10/15/21 2200 10/15/2021 1621 10/17/21 0503  WBC 6.7 5.2 4.0 5.7  NEUTROABS  --  4.6 3.4 5.4  HGB 9.3* 8.2* 9.9* 8.4*  HCT 29.6* 26.4* 32.2* 26.7*  MCV 88.9 89.8 90.4 88.7  PLT 211 246 310 425   Basic Metabolic Panel: Recent Labs  Lab 10/13/21 1136 10/13/21 1651 10/15/21 2200 10/07/2021 1759 10/31/2021 2158 10/17/21 0503  NA 135  --  133* 132*  --  132*  K 3.5  --  3.7 3.9  --  3.6  CL 98  --  98 97*  --  96*  CO2 27  --  24 23  --  23  GLUCOSE 211*  --  180* 255*  --  327*  BUN 37*  --  49* 59*  --  54*  CREATININE 5.76*  --  6.62* 7.71*  --  6.80*  CALCIUM 8.1*  --  7.3* 7.2*  --  7.2*  MG  --  1.8  --   --  1.7  --   PHOS  --   --   --   --   --  5.2*   GFR: Estimated Creatinine Clearance: 5.5 mL/min (A) (by C-G formula based on SCr of 6.8 mg/dL (H)). Liver Function Tests: Recent Labs  Lab 10/15/21 2200 10/13/2021 1759  AST 24 26  ALT 10 10  ALKPHOS 65 59  BILITOT 0.7 0.5  PROT 6.0* 5.9*  ALBUMIN 1.9* 1.9*   No results for input(s): LIPASE, AMYLASE in the last 168 hours. No results for input(s): AMMONIA in the last 168 hours. Coagulation Profile: Recent Labs  Lab 10/15/21 2248 10/20/2021 1759  INR 1.2 1.3*   Cardiac Enzymes: No results for input(s): CKTOTAL, CKMB, CKMBINDEX, TROPONINI in the last 168 hours. BNP (last 3 results) No results for input(s): PROBNP in the last 8760 hours. HbA1C: Recent Labs    10/17/21 0503  HGBA1C 5.7*   CBG: Recent Labs  Lab 10/17/21 0812 10/17/21 1141 10/17/21 1732 10/17/21 2042 10/18/21 0741  GLUCAP 368* 241* 169* 257* 238*   Lipid  Profile: No results for input(s): CHOL, HDL, LDLCALC, TRIG, CHOLHDL, LDLDIRECT in the last 72 hours. Thyroid Function Tests: No results for input(s): TSH, T4TOTAL, FREET4, T3FREE, THYROIDAB in the last 72 hours. Anemia Panel: No results for input(s): VITAMINB12, FOLATE, FERRITIN, TIBC, IRON, RETICCTPCT in the last 72 hours. Sepsis Labs: Recent Labs  Lab 10/15/21 2200 10/15/2021 0057 10/08/2021 1718 10/15/2021 2158  PROCALCITON  --   --   --  6.86  LATICACIDVEN 1.0 0.7 2.0* 2.2*    Recent Results (from the past 240 hour(s))  Body fluid culture w Gram Stain     Status: None   Collection Time: 10/13/21 12:41 PM   Specimen: Peritoneal Washings; Body Fluid  Result Value Ref Range Status   Specimen Description   Final    PERITONEAL Performed at [facility], [address] [place], [place] [address]    Special Requests   Final    PERITONEAL Performed at [facility], [place], [place] [address]    Gram Stain   Final    NO SQUAMOUS EPITHELIAL CELLS SEEN FEW WBC SEEN NO ORGANISMS SEEN    Culture   Final    NO GROWTH 3 DAYS Performed at [facility], [place] [address] [place], [place] [address]    Report Status 10/17/2021 FINAL  Final  Resp Panel by RT-PCR (Flu A&B, Covid) Nasopharyngeal Swab     Status: None   Collection Time: 10/13/21  3:23 PM   Specimen: Nasopharyngeal Swab; Nasopharyngeal(NP) swabs in vial transport medium  Result Value Ref Range Status   SARS Coronavirus 2 by RT PCR NEGATIVE NEGATIVE Final    Comment: (NOTE) SARS-CoV-2 target nucleic acids are NOT DETECTED.  The SARS-CoV-2 RNA is generally detectable in upper respiratory specimens during the acute phase of infection. The lowest concentration of SARS-CoV-2 viral copies this assay can detect is 138 copies/mL. A negative result does not preclude SARS-Cov-2 infection and should not be used as the sole basis for treatment or other patient management decisions. A negative  result may occur with  improper specimen collection/handling, submission of specimen other than nasopharyngeal swab, presence of viral mutation(s) within the areas targeted by this assay, and inadequate number of viral copies(<138 copies/mL). A negative result must be combined with clinical observations, patient history, and epidemiological information. The expected result is Negative.  Fact Sheet for Patients:  EntrepreneurPulse.com.au  Fact Sheet for Healthcare Providers:  IncredibleEmployment.be  This test is no t yet approved or cleared by the [place] FDA and  has been authorized for detection and/or diagnosis of SARS-CoV-2 by FDA under an Emergency Use Authorization (EUA). This EUA will remain  in effect (meaning this test can be used) for the duration of the COVID-19 declaration under Section 564(b)(1) of the Act, 21 U.S.C.section 360bbb-3(b)(1), unless the authorization is terminated  or revoked sooner.       Influenza A by PCR NEGATIVE NEGATIVE Final   Influenza B by PCR NEGATIVE NEGATIVE Final    Comment: (NOTE) The Xpert Xpress SARS-CoV-2/FLU/RSV plus assay is intended as an aid in the diagnosis of influenza from Nasopharyngeal swab specimens and should not be used as a sole basis for treatment. Nasal washings and aspirates are unacceptable for Xpert Xpress SARS-CoV-2/FLU/RSV testing.  Fact Sheet for Patients: EntrepreneurPulse.com.au  Fact Sheet for Healthcare Providers: IncredibleEmployment.be  This test is not yet approved or cleared by the [place] FDA and has been authorized for detection and/or diagnosis of SARS-CoV-2 by FDA under an Emergency Use Authorization (EUA). This EUA will remain in effect (meaning this test can be used) for the duration of the COVID-19 declaration under Section 564(b)(1) of the Act, 21 U.S.C. section 360bbb-3(b)(1), unless the authorization is  terminated or revoked.  Performed at [facility], [address] [place], [place] [address]   Culture, blood (Routine x 2)     Status: None (Preliminary result)   Collection Time: 10/15/21 10:00 PM   Specimen: BLOOD LEFT WRIST  Result Value Ref Range Status   Specimen Description   Final    BLOOD LEFT WRIST BOTTLES DRAWN AEROBIC AND ANAEROBIC   Special Requests Blood Culture adequate volume  Final   Culture  Final    NO GROWTH 2 DAYS Performed at [facility], [address] [place], [place] [address]    Report Status PENDING  Incomplete  Culture, blood (Routine x 2)     Status: None (Preliminary result)   Collection Time: 10/15/21 10:44 PM   Specimen: BLOOD RIGHT ARM  Result Value Ref Range Status   Specimen Description BLOOD RIGHT ARM  Final   Special Requests   Final    BOTTLES DRAWN AEROBIC AND ANAEROBIC Blood Culture adequate volume   Culture   Final    NO GROWTH 2 DAYS Performed at [facility], [address] [place], [place] [address]    Report Status PENDING  Incomplete  Resp Panel by RT-PCR (Flu A&B, Covid) Nasopharyngeal Swab     Status: None   Collection Time: 11/01/2021  5:18 PM   Specimen: Nasopharyngeal Swab; Nasopharyngeal(NP) swabs in vial transport medium  Result Value Ref Range Status   SARS Coronavirus 2 by RT PCR NEGATIVE NEGATIVE Final    Comment: (NOTE) SARS-CoV-2 target nucleic acids are NOT DETECTED.  The SARS-CoV-2 RNA is generally detectable in upper respiratory specimens during the acute phase of infection. The lowest concentration of SARS-CoV-2 viral copies this assay can detect is 138 copies/mL. A negative result does not preclude SARS-Cov-2 infection and should not be used as the sole basis for treatment or other patient management decisions. A negative result may occur with  improper specimen collection/handling, submission of specimen other than nasopharyngeal swab, presence of viral mutation(s) within the areas targeted by  this assay, and inadequate number of viral copies(<138 copies/mL). A negative result must be combined with clinical observations, patient history, and epidemiological information. The expected result is Negative.  Fact Sheet for Patients:  EntrepreneurPulse.com.au  Fact Sheet for Healthcare Providers:  IncredibleEmployment.be  This test is no t yet approved or cleared by the [place] FDA and  has been authorized for detection and/or diagnosis of SARS-CoV-2 by FDA under an Emergency Use Authorization (EUA). This EUA will remain  in effect (meaning this test can be used) for the duration of the COVID-19 declaration under Section 564(b)(1) of the Act, 21 U.S.C.section 360bbb-3(b)(1), unless the authorization is terminated  or revoked sooner.       Influenza A by PCR NEGATIVE NEGATIVE Final   Influenza B by PCR NEGATIVE NEGATIVE Final    Comment: (NOTE) The Xpert Xpress SARS-CoV-2/FLU/RSV plus assay is intended as an aid in the diagnosis of influenza from Nasopharyngeal swab specimens and should not be used as a sole basis for treatment. Nasal washings and aspirates are unacceptable for Xpert Xpress SARS-CoV-2/FLU/RSV testing.  Fact Sheet for Patients: EntrepreneurPulse.com.au  Fact Sheet for Healthcare Providers: IncredibleEmployment.be  This test is not yet approved or cleared by the [place] FDA and has been authorized for detection and/or diagnosis of SARS-CoV-2 by FDA under an Emergency Use Authorization (EUA). This EUA will remain in effect (meaning this test can be used) for the duration of the COVID-19 declaration under Section 564(b)(1) of the Act, 21 U.S.C. section 360bbb-3(b)(1), unless the authorization is terminated or revoked.  Performed at [facility], [address] [place], [place] [address]   Body fluid culture w Gram Stain     Status: None (Preliminary result)    Collection Time: 10/11/2021 11:40 PM   Specimen: Peritoneal Washings; Body Fluid  Result Value Ref Range Status   Specimen Description   Final    PERITONEAL DIALYSIS Performed at [facility], [place]  [address] [place], [place] [address]    Special Requests   Final    NONE Performed at [facility], [place], [place] [address]    Gram Stain   Final    NO SQUAMOUS EPITHELIAL CELLS SEEN NO WBC SEEN NO ORGANISMS SEEN    Culture   Final    NO GROWTH 1 DAY Performed at [facility], [place] [address] [place], [place] [address]    Report Status PENDING  Incomplete         Radiology Studies: CT Soft Tissue Neck Wo Contrast  Result Date: 10/24/2021 CLINICAL DATA:  Right neck/facial swelling, pain, and sore throat. EXAM: CT NECK WITHOUT CONTRAST TECHNIQUE: Multidetector CT imaging of the neck was performed following the standard protocol without intravenous contrast. RADIATION DOSE REDUCTION: This exam was performed according to the departmental dose-optimization program which includes automated exposure control, adjustment of the mA and/or kV according to patient size and/or use of iterative reconstruction technique. COMPARISON:  Chest CTA 10/13/2021. FINDINGS: Pharynx and larynx: Moderate low-density soft tissue swelling at the level of the hypopharynx bilaterally effacing the para form sinuses, new from the recent chest CTA. Mild narrowing of the lower oropharyngeal and supraglottic airway. No retropharyngeal fluid collection. No evidence of significant edema involving the epiglottis or floor of mouth. Salivary glands: No inflammation, mass, or stone. Thyroid: Thyroid nodules measuring up to 1.4 cm as seen on the recent chest CTA and for which no follow-up imaging is recommended. Lymph nodes: No enlarged or suspicious lymph nodes in the neck. Vascular: Calcified atherosclerosis at the carotid bifurcations. Limited intracranial: Proximally 2 cm partially  calcified mass along the right sphenoid wing, incompletely imaged. Visualized orbits: Bilateral cataract extraction. Mastoids and visualized paranasal sinuses: Mild mucosal thickening/small mucous retention cysts in the left greater than right maxillary sinuses. Trace right mastoid fluid. Skeleton: Moderate cervical disc and facet degeneration. Upper chest: Mild biapical pleuroparenchymal lung scarring. Other: Moderate to prominent right lower facial soft tissue swelling. No drainable fluid collection identified within limitations of noncontrast technique and dental streak artifact. IMPRESSION: 1. Right lower facial soft tissue swelling, possibly cellulitis. No drainable fluid collection identified. 2. Moderate hypopharyngeal swelling, new from 91/47/8295 and of uncertain etiology though possibly infectious or allergic. 3. 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 Consider nonemergent contrast enhanced brain MRI for further evaluation. Electronically Signed   By: Logan Bores M.D.   On: 10/18/2021 17:18   DG Chest Port 1 View  Result Date: 10/18/2021 CLINICAL DATA:  Hypoxia. EXAM: PORTABLE CHEST 1 VIEW COMPARISON:  10/19/2021 FINDINGS: Stable cardiomediastinal contours. Aortic atherosclerosis noted. Pulmonary vascular congestion. No pleural effusion or airspace consolidation. IMPRESSION: Pulmonary vascular congestion. Electronically Signed   By: Kerby Moors M.D.   On: 10/18/2021 09:26   DG Chest Port 1 View  Result Date: 10/26/2021 CLINICAL DATA:  Possible sepsis. EXAM: PORTABLE CHEST 1 VIEW COMPARISON:  10/15/2021 FINDINGS: Artifact overlies the chest. Heart size is normal. There is aortic atherosclerosis. The lungs are clear. No heart failure. No acute finding. IMPRESSION: No active disease. Electronically Signed   By: Nelson Chimes M.D.   On: 10/11/2021 16:57        Scheduled Meds:  cloNIDine  0.1 mg Oral Daily   gentamicin cream  1 application  Topical Daily   heparin  5,000 Units Subcutaneous Q8H   hydrALAZINE  100 mg Oral Q8H   insulin aspart  0-5 Units Subcutaneous QHS  insulin aspart  0-6 Units Subcutaneous TID WC   insulin glargine-yfgn  5 Units Subcutaneous Daily   isosorbide mononitrate  30 mg Oral Daily   pantoprazole (PROTONIX) IV  40 mg Intravenous Q24H   Continuous Infusions:  dialysis solution 1.5% low-MG/low-CA     lactated ringers 35 mL/hr at 10/17/21 1501     LOS: 0 days       Sidney Ace, MD [facility]   If 7PM-7AM, please contact night-coverage  10/18/2021, 10:54 AM

## 2021-10-18 NOTE — Progress Notes (Signed)
Dr. Priscella Mann notified of increased swelling overnight, and increased anxiety. Orders to treat the angioedema, and PRN ativan added for anxiety were added.

## 2021-10-18 NOTE — Progress Notes (Signed)
[facility]  ROUNDING NOTE   Subjective:   [name] was admitted to [facility] on 10/15/2021 for Facial swelling [R22.0] Angioedema, initial encounter [T78.3XXA] Fever, unspecified fever cause [R50.9] Angioedema [T78.3XXA]  Patient is known to our clinic and receives PD management at [facility], supervised by Dr Juleen China.   Patient seen resting in bed, restless Facial edema has worsened overnight.  Per nursing note, edema worsen and orders given for steroids and benadryl. Patient remains on room air  Mentation intact   Objective:  Vital signs in last 24 hours:  Temp:  [97.6 F (36.4 C)-99 F (37.2 C)] 98 F (36.7 C) (02/14 1337) Pulse Rate:  [97-112] 105 (02/14 1500) Resp:  [16-21] 21 (02/14 1500) BP: (163-181)/(72-137) 179/137 (02/14 1500) SpO2:  [97 %-100 %] 97 % (02/14 1500) Weight:  [53.9 kg-55.9 kg] 53.9 kg (02/14 1337)  Weight change: 0.8 kg Filed Weights   10/17/21 1804 10/18/21 0500 10/18/21 1337  Weight: 55.9 kg 55.6 kg 53.9 kg    Intake/Output: I/O last 3 completed shifts: In: 96222 [I.V.:78; Other:11000; IV Piggyback:77.9] Out: [phone] [Other:19832]   Intake/Output this shift:  Total I/O In: 10576.1 [I.V.:526.1; Other:10000; IV Piggyback:50] Out: 10366 [QJJHE:17408]  Physical Exam: General: NAD, anxious  Head: Normocephalic, atraumatic. Moist oral mucosal membranes, Upper lip and tongue edema  Eyes: Anicteric  Lungs:  Clear to auscultation, normal effort  Heart: Regular rate and rhythm  Abdomen:  Soft, nontender  Extremities:  no peripheral edema.  Neurologic: Nonfocal, moving all four extremities  Skin: No lesions  Access: PD catheter    Basic Metabolic Panel: Recent Labs  Lab 10/13/21 1136 10/13/21 1651 10/15/21 2200 10/19/2021 1759 10/05/2021 2158 10/17/21 0503  NA 135  --  133* 132*  --  132*  K 3.5  --  3.7 3.9  --  3.6  CL 98  --  98 97*  --  96*  CO2 27  --  24 23  --  23  GLUCOSE 211*  --  180* 255*  --  327*   BUN 37*  --  49* 59*  --  54*  CREATININE 5.76*  --  6.62* 7.71*  --  6.80*  CALCIUM 8.1*  --  7.3* 7.2*  --  7.2*  MG  --  1.8  --   --  1.7  --   PHOS  --   --   --   --   --  5.2*     Liver Function Tests: Recent Labs  Lab 10/15/21 2200 10/23/2021 1759  AST 24 26  ALT 10 10  ALKPHOS 65 59  BILITOT 0.7 0.5  PROT 6.0* 5.9*  ALBUMIN 1.9* 1.9*    No results for input(s): LIPASE, AMYLASE in the last 168 hours. No results for input(s): AMMONIA in the last 168 hours.  CBC: Recent Labs  Lab 10/13/21 1136 10/15/21 2200 10/09/2021 1621 10/17/21 0503  WBC 6.7 5.2 4.0 5.7  NEUTROABS  --  4.6 3.4 5.4  HGB 9.3* 8.2* 9.9* 8.4*  HCT 29.6* 26.4* 32.2* 26.7*  MCV 88.9 89.8 90.4 88.7  PLT 211 246 310 266     Cardiac Enzymes: No results for input(s): CKTOTAL, CKMB, CKMBINDEX, TROPONINI in the last 168 hours.  BNP: Invalid input(s): POCBNP  CBG: Recent Labs  Lab 10/17/21 1732 10/17/21 2042 10/18/21 0741 10/18/21 1141 10/18/21 1340  GLUCAP 169* 257* 238* 164* 164*     Microbiology: Results for orders placed or performed during the hospital encounter  of 10/21/2021  Resp Panel by RT-PCR (Flu A&B, Covid) Nasopharyngeal Swab     Status: None   Collection Time: 10/21/2021  5:18 PM   Specimen: Nasopharyngeal Swab; Nasopharyngeal(NP) swabs in vial transport medium  Result Value Ref Range Status   SARS Coronavirus 2 by RT PCR NEGATIVE NEGATIVE Final    Comment: (NOTE) SARS-CoV-2 target nucleic acids are NOT DETECTED.  The SARS-CoV-2 RNA is generally detectable in upper respiratory specimens during the acute phase of infection. The lowest concentration of SARS-CoV-2 viral copies this assay can detect is 138 copies/mL. A negative result does not preclude SARS-Cov-2 infection and should not be used as the sole basis for treatment or other patient management decisions. A negative result may occur with  improper specimen collection/handling, submission of specimen other than  nasopharyngeal swab, presence of viral mutation(s) within the areas targeted by this assay, and inadequate number of viral copies(<138 copies/mL). A negative result must be combined with clinical observations, patient history, and epidemiological information. The expected result is Negative.  Fact Sheet for Patients:  EntrepreneurPulse.com.au  Fact Sheet for Healthcare Providers:  IncredibleEmployment.be  This test is no t yet approved or cleared by the [place] FDA and  has been authorized for detection and/or diagnosis of SARS-CoV-2 by FDA under an Emergency Use Authorization (EUA). This EUA will remain  in effect (meaning this test can be used) for the duration of the COVID-19 declaration under Section 564(b)(1) of the Act, 21 U.S.C.section 360bbb-3(b)(1), unless the authorization is terminated  or revoked sooner.       Influenza A by PCR NEGATIVE NEGATIVE Final   Influenza B by PCR NEGATIVE NEGATIVE Final    Comment: (NOTE) The Xpert Xpress SARS-CoV-2/FLU/RSV plus assay is intended as an aid in the diagnosis of influenza from Nasopharyngeal swab specimens and should not be used as a sole basis for treatment. Nasal washings and aspirates are unacceptable for Xpert Xpress SARS-CoV-2/FLU/RSV testing.  Fact Sheet for Patients: EntrepreneurPulse.com.au  Fact Sheet for Healthcare Providers: IncredibleEmployment.be  This test is not yet approved or cleared by the [place] FDA and has been authorized for detection and/or diagnosis of SARS-CoV-2 by FDA under an Emergency Use Authorization (EUA). This EUA will remain in effect (meaning this test can be used) for the duration of the COVID-19 declaration under Section 564(b)(1) of the Act, 21 U.S.C. section 360bbb-3(b)(1), unless the authorization is terminated or revoked.  Performed at [facility], [address] [place], [place]  [address]   Body fluid culture w Gram Stain     Status: None (Preliminary result)   Collection Time: 10/11/2021 11:40 PM   Specimen: Peritoneal Washings; Body Fluid  Result Value Ref Range Status   Specimen Description   Final    PERITONEAL DIALYSIS Performed at [facility], [address] [place], [place] [address]    Special Requests   Final    NONE Performed at [facility], [address] [place], [place] [address]    Gram Stain   Final    NO SQUAMOUS EPITHELIAL CELLS SEEN NO WBC SEEN NO ORGANISMS SEEN    Culture   Final    NO GROWTH 1 DAY Performed at [facility], [address], [place], [place] [address]    Report Status PENDING  Incomplete  MRSA Next Gen by PCR, Nasal     Status: None   Collection Time: 10/18/21  1:40 PM   Specimen: Nasal Mucosa; Nasal Swab  Result Value Ref  Range Status   MRSA by PCR Next Gen NOT DETECTED NOT DETECTED Final    Comment: (NOTE) The GeneXpert MRSA Assay (FDA approved for NASAL specimens only), is one component of a comprehensive MRSA colonization surveillance program. It is not intended to diagnose MRSA infection nor to guide or monitor treatment for MRSA infections. Test performance is not FDA approved in patients less than 67 years old. Performed at [facility], [address] [place], [place] [address]     Coagulation Studies: Recent Labs    10/15/21 2248 10/29/2021 1759  LABPROT 15.5* 16.5*  INR 1.2 1.3*     Urinalysis: No results for input(s): COLORURINE, LABSPEC, PHURINE, GLUCOSEU, HGBUR, BILIRUBINUR, KETONESUR, PROTEINUR, UROBILINOGEN, NITRITE, LEUKOCYTESUR in the last 72 hours.  Invalid input(s): APPERANCEUR    Imaging: CT Soft Tissue Neck Wo Contrast  Result Date: 10/05/2021 CLINICAL DATA:  Right neck/facial swelling, pain, and sore throat. EXAM: CT NECK WITHOUT CONTRAST TECHNIQUE: Multidetector CT imaging of the neck was performed following the standard protocol without  intravenous contrast. RADIATION DOSE REDUCTION: This exam was performed according to the departmental dose-optimization program which includes automated exposure control, adjustment of the mA and/or kV according to patient size and/or use of iterative reconstruction technique. COMPARISON:  Chest CTA 10/13/2021. FINDINGS: Pharynx and larynx: Moderate low-density soft tissue swelling at the level of the hypopharynx bilaterally effacing the para form sinuses, new from the recent chest CTA. Mild narrowing of the lower oropharyngeal and supraglottic airway. No retropharyngeal fluid collection. No evidence of significant edema involving the epiglottis or floor of mouth. Salivary glands: No inflammation, mass, or stone. Thyroid: Thyroid nodules measuring up to 1.4 cm as seen on the recent chest CTA and for which no follow-up imaging is recommended. Lymph nodes: No enlarged or suspicious lymph nodes in the neck. Vascular: Calcified atherosclerosis at the carotid bifurcations. Limited intracranial: Proximally 2 cm partially calcified mass along the right sphenoid wing, incompletely imaged. Visualized orbits: Bilateral cataract extraction. Mastoids and visualized paranasal sinuses: Mild mucosal thickening/small mucous retention cysts in the left greater than right maxillary sinuses. Trace right mastoid fluid. Skeleton: Moderate cervical disc and facet degeneration. Upper chest: Mild biapical pleuroparenchymal lung scarring. Other: Moderate to prominent right lower facial soft tissue swelling. No drainable fluid collection identified within limitations of noncontrast technique and dental streak artifact. IMPRESSION: 1. Right lower facial soft tissue swelling, possibly cellulitis. No drainable fluid collection identified. 2. Moderate hypopharyngeal swelling, new from 71/02/2693 and of uncertain etiology though possibly infectious or allergic. 3. 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 Consider nonemergent contrast enhanced brain MRI for further evaluation. Electronically Signed   By: Logan Bores M.D.   On: 10/27/2021 17:18   DG Chest Port 1 View  Result Date: 10/18/2021 CLINICAL DATA:  Hypoxia. EXAM: PORTABLE CHEST 1 VIEW COMPARISON:  10/24/2021 FINDINGS: Stable cardiomediastinal contours. Aortic atherosclerosis noted. Pulmonary vascular congestion. No pleural effusion or airspace consolidation. IMPRESSION: Pulmonary vascular congestion. Electronically Signed   By: Kerby Moors M.D.   On: 10/18/2021 09:26   DG Chest Port 1 View  Result Date: 11/01/2021 CLINICAL DATA:  Possible sepsis. EXAM: PORTABLE CHEST 1 VIEW COMPARISON:  10/15/2021 FINDINGS: Artifact overlies the chest. Heart size is normal. There is aortic atherosclerosis. The lungs are clear. No heart failure. No acute finding. IMPRESSION: No active disease. Electronically Signed   By: Nelson Chimes M.D.   On: 10/13/2021 16:57     Medications:    dialysis  solution 1.5% low-MG/low-CA     lactated ringers 35 mL/hr at 10/18/21 1416    Chlorhexidine Gluconate Cloth  6 each Topical Daily   cloNIDine  0.1 mg Oral Daily   dexamethasone (DECADRON) injection  6 mg Intravenous Q6H   gentamicin cream  1 application Topical Daily   heparin  5,000 Units Subcutaneous Q8H   hydrALAZINE  100 mg Oral Q8H   insulin aspart  0-5 Units Subcutaneous QHS   insulin aspart  0-6 Units Subcutaneous TID WC   insulin glargine-yfgn  5 Units Subcutaneous Daily   [START ON 10/19/2021] isosorbide mononitrate  60 mg Oral Daily   pantoprazole (PROTONIX) IV  40 mg Intravenous Q24H   acetaminophen **OR** acetaminophen, diphenhydrAMINE, dianeal solution for CAPD/CCPD with heparin, hydrALAZINE, LORazepam, ondansetron **OR** ondansetron (ZOFRAN) IV  Assessment/ Plan:  Ms. ALENI ANDRUS is a 81 y.o. white female with end stage renal disease on peritoneal dialysis, diabetes mellitus type II insulin dependent, hypertension, and  hyperlipidemia who is admitted to [facility] on 10/06/2021 for Facial swelling [R22.0] Angioedema, initial encounter [T78.3XXA] Fever, unspecified fever cause [R50.9] Angioedema [T78.3XXA]  [facility] 59kg CCPD 5 exchanges 2 liter fills 9 hours  Hyponatremia with end Stage Renal Disease:  Sodium 132.  Will continue nightly treatments.May consider heparin in PD bag due to slow drain.   Hypertension: 167/84 Resume home medications   Anemia of chronic kidney disease:  Mircera as outpatient. Will monitor  4. Angioedema, believed due to prescribed ARB, currently held.   Worsened overnight despite steroids and benadryl. Concern for stable airway has moved patient to ICU for closer monitoring.    LOS: 0 Anivea Velasques 2/14/20233:40 PM

## 2021-10-18 NOTE — Progress Notes (Signed)
PD completed. Site no infection. No fluid removed.

## 2021-10-19 ENCOUNTER — Inpatient Hospital Stay: Payer: Medicare PPO

## 2021-10-19 DIAGNOSIS — T783XXA Angioneurotic edema, initial encounter: Secondary | ICD-10-CM | POA: Diagnosis not present

## 2021-10-19 LAB — BASIC METABOLIC PANEL
Anion gap: 11 (ref 5–15)
BUN: 51 mg/dL — ABNORMAL HIGH (ref 8–23)
CO2: 25 mmol/L (ref 22–32)
Calcium: 7.2 mg/dL — ABNORMAL LOW (ref 8.9–10.3)
Chloride: 96 mmol/L — ABNORMAL LOW (ref 98–111)
Creatinine, Ser: 5.55 mg/dL — ABNORMAL HIGH (ref 0.44–1.00)
GFR, Estimated: 7 mL/min — ABNORMAL LOW (ref >60)
Glucose, Bld: 279 mg/dL — ABNORMAL HIGH (ref 70–99)
Potassium: 3.2 mmol/L — ABNORMAL LOW (ref 3.5–5.1)
Sodium: 132 mmol/L — ABNORMAL LOW (ref 135–145)

## 2021-10-19 LAB — GLUCOSE, CAPILLARY
Glucose-Capillary: 257 mg/dL — ABNORMAL HIGH (ref 70–99)
Glucose-Capillary: 191 mg/dL — ABNORMAL HIGH (ref 70–99)
Glucose-Capillary: 109 mg/dL — ABNORMAL HIGH (ref 70–99)
Glucose-Capillary: 275 mg/dL — ABNORMAL HIGH (ref 70–99)

## 2021-10-19 LAB — MAGNESIUM: Magnesium: 1.6 mg/dL — ABNORMAL LOW (ref 1.7–2.4)

## 2021-10-19 LAB — TSH: TSH: 0.313 u[IU]/mL — ABNORMAL LOW (ref 0.350–4.500)

## 2021-10-19 LAB — HEPARIN LEVEL (UNFRACTIONATED): Heparin Unfractionated: 0.1 IU/mL — ABNORMAL LOW (ref 0.30–0.70)

## 2021-10-19 LAB — TROPONIN I (HIGH SENSITIVITY): Troponin I (High Sensitivity): 85 ng/L — ABNORMAL HIGH (ref <18)

## 2021-10-19 MED ORDER — POTASSIUM CHLORIDE 10 MEQ/100ML IV SOLN
10.0000 meq | INTRAVENOUS | Status: AC
  Administered 2021-10-19 (×2): 10 meq via INTRAVENOUS
  Filled 2021-10-19 (×2): qty 100

## 2021-10-19 MED ORDER — MAGNESIUM SULFATE 2 GM/50ML IV SOLN
2.0000 g | Freq: Once | INTRAVENOUS | Status: AC
  Administered 2021-10-19: 2 g via INTRAVENOUS
  Filled 2021-10-19: qty 50

## 2021-10-19 MED ORDER — HEPARIN BOLUS VIA INFUSION
1700.0000 [IU] | Freq: Once | INTRAVENOUS | Status: AC
  Administered 2021-10-19: 1700 [IU] via INTRAVENOUS
  Filled 2021-10-19: qty 1700

## 2021-10-19 MED ORDER — HYDRALAZINE HCL 20 MG/ML IJ SOLN
20.0000 mg | Freq: Three times a day (TID) | INTRAMUSCULAR | Status: DC
  Administered 2021-10-19 – 2021-10-20 (×4): 20 mg via INTRAVENOUS
  Filled 2021-10-19 (×4): qty 1

## 2021-10-19 MED ORDER — ACETAMINOPHEN 10 MG/ML IV SOLN
1000.0000 mg | Freq: Once | INTRAVENOUS | Status: AC
  Administered 2021-10-19: 1000 mg via INTRAVENOUS
  Filled 2021-10-19: qty 100

## 2021-10-19 MED ORDER — DEXAMETHASONE SODIUM PHOSPHATE 4 MG/ML IJ SOLN
6.0000 mg | Freq: Four times a day (QID) | INTRAMUSCULAR | Status: DC
  Administered 2021-10-20 – 2021-10-23 (×14): 6 mg via INTRAVENOUS
  Filled 2021-10-19 (×13): qty 2

## 2021-10-19 MED ORDER — HEPARIN SODIUM (PORCINE) 1000 UNIT/ML IJ SOLN
INTRAMUSCULAR | Status: AC
  Filled 2021-10-19: qty 10

## 2021-10-19 MED ORDER — DILTIAZEM HCL 25 MG/5ML IV SOLN
10.0000 mg | Freq: Once | INTRAVENOUS | Status: AC
  Administered 2021-10-19: 10 mg via INTRAVENOUS
  Filled 2021-10-19: qty 5

## 2021-10-19 MED ORDER — HEPARIN (PORCINE) 25000 UT/250ML-% IV SOLN
1200.0000 [IU]/h | INTRAVENOUS | Status: DC
  Administered 2021-10-19: 07:00:00 750 [IU]/h via INTRAVENOUS
  Administered 2021-10-20: 1100 [IU]/h via INTRAVENOUS
  Filled 2021-10-19 (×2): qty 250

## 2021-10-19 NOTE — Progress Notes (Signed)
PROGRESS NOTE    Catherine Burke  PJA:[id number] DOB: 1940/11/15 DOA: 10/24/2021 PCP: Idelle Crouch, MD  [id number]   Assessment & Plan:   Principal Problem:   Angioedema, initial encounter Active Problems:   Hyperlipidemia   Essential hypertension   GERD (gastroesophageal reflux disease)   Chronic kidney disease (CKD), stage IV (severe) (HCC)   ESRD (end stage renal disease) ([place])   Facial cellulitis   Malnutrition of moderate degree   Angioedema   81 year old with medical history of end-stage renal disease on peritoneal dialysis, hypertension, insulin-dependent diabetes mellitus, who presented emergency department for chief concerns of right facial swelling.   Facial swelling thought to be due to angioedema secondary to ACE inhibitor use.  ACE inhibitor's been discontinued and added to allergy list.   2/14:  Patient had worsening of her facial and tongue swelling.  Pt transferred to [facility] and PCCM consulted on 2/14 for close monitoring.   * Angioedema --s/p -Solu-Medrol 125 mg x 1, Benadryl 25 mg x 1, Pepcid 20 mg IV x1 -EpiPen x1 Plan: --cont IV decadron 6 mg q6h, per PCCM --no need for further IV Benadryl --NPO for now   Essential hypertension Poor control over interval --home ARB d/c'ed --cont clonidine patch 0.2 mg --IV hydralazine 20 mg q8h while NPO   ESRD on peritoneal dialysis (end stage renal disease) ([place]) --iPD per nephrology   Facial cellulitis Assessment & Plan Facial cellulitis felt unlikely - DC'ed antibiotics  Anemia of CKD --Mircera as outpatient  Afib --noted early morning 2/15, new dx --started on heparin gtt  --currently rate controlled --cont heparin gtt for now  Hypokalemia --replete with IV potassium while NPO   DVT prophylaxis: [id number] gtt Code Status: Limited code  Family Communication: daughter updated at bedside today  Level of care: Stepdown Dispo:   The patient is from: home Anticipated d/c is to:  home Anticipated d/c date is: 1-2 days Patient currently is not medically ready to d/c due to: NPO due to angioedema    Subjective and Interval History:  Pt received IV benadryl and IV ativan early this morning, and was very somnolent during rounds.   Objective: Vitals:   10/19/21 0800 10/19/21 0900 10/19/21 1000 10/19/21 1100  BP: (!) 158/69 (!) 144/65 (!) 154/85 132/85  Pulse: 94 91 95 89  Resp:      Temp: 97.7 F (36.5 C)     TempSrc: Axillary     SpO2: 94% 90% 98% 96%  Weight:      Height:        Intake/Output Summary (Last 24 hours) at 10/19/2021 1431 Last data filed at 10/19/2021 0800 Gross per 24 hour  Intake 702.29 ml  Output --  Net 702.29 ml   Filed Weights   10/18/21 1337 10/18/21 1709 10/19/21 0500  Weight: 53.9 kg 53.9 kg 55.4 kg    Examination:   Constitutional: NAD, somnolent, difficult to awake HEENT: mouth open with tongue visible which was dry. CV: No cyanosis.   RESP: normal respiratory effort, on RA SKIN: warm, dry   Data Reviewed: I have personally reviewed following labs and imaging studies  CBC: Recent Labs  Lab 10/13/21 1136 10/15/21 2200 10/27/2021 1621 10/17/21 0503  WBC 6.7 5.2 4.0 5.7  NEUTROABS  --  4.6 3.4 5.4  HGB 9.3* 8.2* 9.9* 8.4*  HCT 29.6* 26.4* 32.2* 26.7*  MCV 88.9 89.8 90.4 88.7  PLT 211 246 310 409   Basic Metabolic Panel: Recent Labs  Lab 10/13/21  1136 10/13/21 1651 10/15/21 2200 10/11/2021 1759 10/13/2021 2158 10/17/21 0503 10/19/21 0458  NA 135  --  133* 132*  --  132* 132*  K 3.5  --  3.7 3.9  --  3.6 3.2*  CL 98  --  98 97*  --  96* 96*  CO2 27  --  24 23  --  23 25  GLUCOSE 211*  --  180* 255*  --  327* 279*  BUN 37*  --  49* 59*  --  54* 51*  CREATININE 5.76*  --  6.62* 7.71*  --  6.80* 5.55*  CALCIUM 8.1*  --  7.3* 7.2*  --  7.2* 7.2*  MG  --  1.8  --   --  1.7  --  1.6*  PHOS  --   --   --   --   --  5.2*  --    GFR: Estimated Creatinine Clearance: 6.7 mL/min (A) (by C-G formula based on SCr of  5.55 mg/dL (H)). Liver Function Tests: Recent Labs  Lab 10/15/21 2200 10/27/2021 1759  AST 24 26  ALT 10 10  ALKPHOS 65 59  BILITOT 0.7 0.5  PROT 6.0* 5.9*  ALBUMIN 1.9* 1.9*   No results for input(s): LIPASE, AMYLASE in the last 168 hours. No results for input(s): AMMONIA in the last 168 hours. Coagulation Profile: Recent Labs  Lab 10/15/21 2248 10/15/2021 1759  INR 1.2 1.3*   Cardiac Enzymes: No results for input(s): CKTOTAL, CKMB, CKMBINDEX, TROPONINI in the last 168 hours. BNP (last 3 results) No results for input(s): PROBNP in the last 8760 hours. HbA1C: Recent Labs    10/17/21 0503  HGBA1C 5.7*   CBG: Recent Labs  Lab 10/18/21 1340 10/18/21 1747 10/18/21 2104 10/19/21 0733 10/19/21 1107  GLUCAP 164* 160* 192* 257* 191*   Lipid Profile: No results for input(s): CHOL, HDL, LDLCALC, TRIG, CHOLHDL, LDLDIRECT in the last 72 hours. Thyroid Function Tests: Recent Labs    10/19/21 0458  TSH 0.313*   Anemia Panel: No results for input(s): VITAMINB12, FOLATE, FERRITIN, TIBC, IRON, RETICCTPCT in the last 72 hours. Sepsis Labs: Recent Labs  Lab 10/15/21 2200 10/21/2021 0057 10/29/2021 1718 10/17/2021 2158  PROCALCITON  --   --   --  6.86  LATICACIDVEN 1.0 0.7 2.0* 2.2*    Recent Results (from the past 240 hour(s))  Body fluid culture w Gram Stain     Status: None   Collection Time: 10/13/21 12:41 PM   Specimen: Peritoneal Washings; Body Fluid  Result Value Ref Range Status   Specimen Description   Final    PERITONEAL Performed at [facility], [address], [place], [place] [address]    Special Requests   Final    PERITONEAL Performed at [facility], [address] [place], [place] [address]    Gram Stain   Final    NO SQUAMOUS EPITHELIAL CELLS SEEN FEW WBC SEEN NO ORGANISMS SEEN    Culture   Final    NO GROWTH 3 DAYS Performed at [facility], [place] [address], [place], [place] [address]    Report Status 10/17/2021  FINAL  Final  Resp Panel by RT-PCR (Flu A&B, Covid) Nasopharyngeal Swab     Status: None   Collection Time: 10/13/21  3:23 PM   Specimen: Nasopharyngeal Swab; Nasopharyngeal(NP) swabs in vial transport medium  Result Value Ref Range Status   SARS Coronavirus 2 by RT PCR NEGATIVE NEGATIVE Final  Comment: (NOTE) SARS-CoV-2 target nucleic acids are NOT DETECTED.  The SARS-CoV-2 RNA is generally detectable in upper respiratory specimens during the acute phase of infection. The lowest concentration of SARS-CoV-2 viral copies this assay can detect is 138 copies/mL. A negative result does not preclude SARS-Cov-2 infection and should not be used as the sole basis for treatment or other patient management decisions. A negative result may occur with  improper specimen collection/handling, submission of specimen other than nasopharyngeal swab, presence of viral mutation(s) within the areas targeted by this assay, and inadequate number of viral copies(<138 copies/mL). A negative result must be combined with clinical observations, patient history, and epidemiological information. The expected result is Negative.  Fact Sheet for Patients:  EntrepreneurPulse.com.au  Fact Sheet for Healthcare Providers:  IncredibleEmployment.be  This test is no t yet approved or cleared by the [place] FDA and  has been authorized for detection and/or diagnosis of SARS-CoV-2 by FDA under an Emergency Use Authorization (EUA). This EUA will remain  in effect (meaning this test can be used) for the duration of the COVID-19 declaration under Section 564(b)(1) of the Act, 21 U.S.C.section 360bbb-3(b)(1), unless the authorization is terminated  or revoked sooner.       Influenza A by PCR NEGATIVE NEGATIVE Final   Influenza B by PCR NEGATIVE NEGATIVE Final    Comment: (NOTE) The Xpert Xpress SARS-CoV-2/FLU/RSV plus assay is intended as an aid in the diagnosis of influenza  from Nasopharyngeal swab specimens and should not be used as a sole basis for treatment. Nasal washings and aspirates are unacceptable for Xpert Xpress SARS-CoV-2/FLU/RSV testing.  Fact Sheet for Patients: EntrepreneurPulse.com.au  Fact Sheet for Healthcare Providers: IncredibleEmployment.be  This test is not yet approved or cleared by the [place] FDA and has been authorized for detection and/or diagnosis of SARS-CoV-2 by FDA under an Emergency Use Authorization (EUA). This EUA will remain in effect (meaning this test can be used) for the duration of the COVID-19 declaration under Section 564(b)(1) of the Act, 21 U.S.C. section 360bbb-3(b)(1), unless the authorization is terminated or revoked.  Performed at [facility], [address] [place], [place] [address]   Culture, blood (Routine x 2)     Status: None (Preliminary result)   Collection Time: 10/15/21 10:00 PM   Specimen: BLOOD LEFT WRIST  Result Value Ref Range Status   Specimen Description   Final    BLOOD LEFT WRIST BOTTLES DRAWN AEROBIC AND ANAEROBIC   Special Requests Blood Culture adequate volume  Final   Culture   Final    NO GROWTH 4 DAYS Performed at [facility], [address], [place], [place] [address]    Report Status PENDING  Incomplete  Culture, blood (Routine x 2)     Status: None (Preliminary result)   Collection Time: 10/15/21 10:44 PM   Specimen: BLOOD RIGHT ARM  Result Value Ref Range Status   Specimen Description BLOOD RIGHT ARM  Final   Special Requests   Final    BOTTLES DRAWN AEROBIC AND ANAEROBIC Blood Culture adequate volume   Culture   Final    NO GROWTH 4 DAYS Performed at [facility], [address] [place], [place] [address]    Report Status PENDING  Incomplete  Resp Panel by RT-PCR (Flu A&B, Covid) Nasopharyngeal Swab     Status: None   Collection Time: 10/20/2021  5:18 PM   Specimen: Nasopharyngeal Swab; Nasopharyngeal(NP) swabs in  vial transport medium  Result Value Ref Range Status  SARS Coronavirus 2 by RT PCR NEGATIVE NEGATIVE Final    Comment: (NOTE) SARS-CoV-2 target nucleic acids are NOT DETECTED.  The SARS-CoV-2 RNA is generally detectable in upper respiratory specimens during the acute phase of infection. The lowest concentration of SARS-CoV-2 viral copies this assay can detect is 138 copies/mL. A negative result does not preclude SARS-Cov-2 infection and should not be used as the sole basis for treatment or other patient management decisions. A negative result may occur with  improper specimen collection/handling, submission of specimen other than nasopharyngeal swab, presence of viral mutation(s) within the areas targeted by this assay, and inadequate number of viral copies(<138 copies/mL). A negative result must be combined with clinical observations, patient history, and epidemiological information. The expected result is Negative.  Fact Sheet for Patients:  EntrepreneurPulse.com.au  Fact Sheet for Healthcare Providers:  IncredibleEmployment.be  This test is no t yet approved or cleared by the [place] FDA and  has been authorized for detection and/or diagnosis of SARS-CoV-2 by FDA under an Emergency Use Authorization (EUA). This EUA will remain  in effect (meaning this test can be used) for the duration of the COVID-19 declaration under Section 564(b)(1) of the Act, 21 U.S.C.section 360bbb-3(b)(1), unless the authorization is terminated  or revoked sooner.       Influenza A by PCR NEGATIVE NEGATIVE Final   Influenza B by PCR NEGATIVE NEGATIVE Final    Comment: (NOTE) The Xpert Xpress SARS-CoV-2/FLU/RSV plus assay is intended as an aid in the diagnosis of influenza from Nasopharyngeal swab specimens and should not be used as a sole basis for treatment. Nasal washings and aspirates are unacceptable for Xpert Xpress SARS-CoV-2/FLU/RSV testing.  Fact  Sheet for Patients: EntrepreneurPulse.com.au  Fact Sheet for Healthcare Providers: IncredibleEmployment.be  This test is not yet approved or cleared by the [place] FDA and has been authorized for detection and/or diagnosis of SARS-CoV-2 by FDA under an Emergency Use Authorization (EUA). This EUA will remain in effect (meaning this test can be used) for the duration of the COVID-19 declaration under Section 564(b)(1) of the Act, 21 U.S.C. section 360bbb-3(b)(1), unless the authorization is terminated or revoked.  Performed at [facility], [address] [place], [place] [address]   Body fluid culture w Gram Stain     Status: None (Preliminary result)   Collection Time: 10/06/2021 11:40 PM   Specimen: Peritoneal Washings; Body Fluid  Result Value Ref Range Status   Specimen Description   Final    PERITONEAL DIALYSIS Performed at [facility], [address], [place], [place] [address]    Special Requests   Final    NONE Performed at [facility], [address] [place], [place] [address]    Gram Stain   Final    NO SQUAMOUS EPITHELIAL CELLS SEEN NO WBC SEEN NO ORGANISMS SEEN    Culture   Final    NO GROWTH 2 DAYS Performed at [facility], [place] [address] [place], [place] [address]    Report Status PENDING  Incomplete  MRSA Next Gen by PCR, Nasal     Status: None   Collection Time: 10/18/21  1:40 PM   Specimen: Nasal Mucosa; Nasal Swab  Result Value Ref Range Status   MRSA by PCR Next Gen NOT DETECTED NOT DETECTED Final    Comment: (NOTE) The GeneXpert MRSA Assay (FDA approved for NASAL specimens only), is one component of a comprehensive MRSA colonization surveillance program. It is not intended to diagnose MRSA  infection nor to guide or monitor treatment for MRSA infections. Test performance is not FDA approved in patients less than 26 years old. Performed at [facility], [address] [place], [place] [address]       Radiology Studies: DG CHEST PORT 1 VIEW  Result Date: 10/19/2021 CLINICAL DATA:  New onset atrial fibrillation EXAM: PORTABLE CHEST 1 VIEW COMPARISON:  August 17, 2022 FINDINGS: The heart size and mediastinal contours are stable. Aortic atherosclerosis. Pulmonary vascular congestion. No overt pulmonary edema or focal airspace consolidation. No visible pleural effusion or pneumothorax. The visualized skeletal structures are unchanged. IMPRESSION: Pulmonary vascular congestion without overt pulmonary edema or focal airspace consolidation. Electronically Signed   By: Dahlia Bailiff M.D.   On: 10/19/2021 08:06   DG Chest Port 1 View  Result Date: 10/18/2021 CLINICAL DATA:  Hypoxia. EXAM: PORTABLE CHEST 1 VIEW COMPARISON:  10/10/2021 FINDINGS: Stable cardiomediastinal contours. Aortic atherosclerosis noted. Pulmonary vascular congestion. No pleural effusion or airspace consolidation. IMPRESSION: Pulmonary vascular congestion. Electronically Signed   By: Kerby Moors M.D.   On: 10/18/2021 09:26     Scheduled Meds:  Chlorhexidine Gluconate Cloth  6 each Topical Daily   cloNIDine  0.2 mg Transdermal Weekly   dexamethasone (DECADRON) injection  6 mg Intravenous Q6H   gentamicin cream  1 application Topical Daily   hydrALAZINE  20 mg Intravenous Q8H   hydrALAZINE  100 mg Oral Q8H   insulin aspart  0-5 Units Subcutaneous QHS   insulin aspart  0-6 Units Subcutaneous TID WC   insulin glargine-yfgn  5 Units Subcutaneous Daily   pantoprazole (PROTONIX) IV  40 mg Intravenous Q24H   Continuous Infusions:  dialysis solution 1.5% low-MG/low-CA     heparin 750 Units/hr (10/19/21 0800)     LOS: 1 day     Enzo Bi, MD [facility] If 7PM-7AM, please contact night-coverage 10/19/2021, 2:31 PM

## 2021-10-19 NOTE — Progress Notes (Signed)
[facility]  ROUNDING NOTE   Subjective:   Catherine Burke was admitted to [facility] on 10/07/2021 for Facial swelling [R22.0] Angioedema, initial encounter [T78.3XXA] Fever, unspecified fever cause [R50.9] Angioedema [T78.3XXA]  Patient is known to our clinic and receives PD management at [facility], supervised by Dr Juleen China.   Patient was moved to ICU yesterday due to worsening of facial swelling and tongue swelling during the day.  She did not require intubation.  Daughter is at bedside today.   Objective:  Vital signs in last 24 hours:  Temp:  [97.7 F (36.5 C)-98.1 F (36.7 C)] 97.7 F (36.5 C) (02/15 0800) Pulse Rate:  [90-123] 94 (02/15 0800) Resp:  [16-31] 16 (02/15 0400) BP: (130-179)/(68-137) 158/69 (02/15 0800) SpO2:  [94 %-99 %] 94 % (02/15 0800) Weight:  [53.9 kg-55.4 kg] 55.4 kg (02/15 0500)  Weight change: -1.2 kg Filed Weights   10/18/21 1337 10/18/21 1709 10/19/21 0500  Weight: 53.9 kg 53.9 kg 55.4 kg    Intake/Output: I/O last 3 completed shifts: In: 11208.5 [I.V.:993.5; Other:10000; IV Piggyback:215] Out: 10366 [Other:10366]   Intake/Output this shift:  Total I/O In: 69.9 [I.V.:69.9] Out: -   Physical Exam: General: NAD,   Head: Facial swelling appears improved compared to yesterday, tongue is still swollen  Eyes: Anicteric  Lungs:  Clear to auscultation, normal effort  Heart: Regular rate and rhythm  Abdomen:  Soft, nontender  Extremities:  + Dependent peripheral edema.  Neurologic: Very somnolent this morning  Skin: No lesions  Access: PD catheter    Basic Metabolic Panel: Recent Labs  Lab 10/13/21 1136 10/13/21 1651 10/15/21 2200 11/01/2021 1759 10/18/2021 2158 10/17/21 0503 10/19/21 0458  NA 135  --  133* 132*  --  132* 132*  K 3.5  --  3.7 3.9  --  3.6 3.2*  CL 98  --  98 97*  --  96* 96*  CO2 27  --  24 23  --  23 25  GLUCOSE 211*  --  180* 255*  --  327* 279*  BUN 37*  --  49* 59*  --  54* 51*  CREATININE  5.76*  --  6.62* 7.71*  --  6.80* 5.55*  CALCIUM 8.1*  --  7.3* 7.2*  --  7.2* 7.2*  MG  --  1.8  --   --  1.7  --  1.6*  PHOS  --   --   --   --   --  5.2*  --      Liver Function Tests: Recent Labs  Lab 10/15/21 2200 10/07/2021 1759  AST 24 26  ALT 10 10  ALKPHOS 65 59  BILITOT 0.7 0.5  PROT 6.0* 5.9*  ALBUMIN 1.9* 1.9*    No results for input(s): LIPASE, AMYLASE in the last 168 hours. No results for input(s): AMMONIA in the last 168 hours.  CBC: Recent Labs  Lab 10/13/21 1136 10/15/21 2200 10/23/2021 1621 10/17/21 0503  WBC 6.7 5.2 4.0 5.7  NEUTROABS  --  4.6 3.4 5.4  HGB 9.3* 8.2* 9.9* 8.4*  HCT 29.6* 26.4* 32.2* 26.7*  MCV 88.9 89.8 90.4 88.7  PLT 211 246 310 266     Cardiac Enzymes: No results for input(s): CKTOTAL, CKMB, CKMBINDEX, TROPONINI in the last 168 hours.  BNP: Invalid input(s): POCBNP  CBG: Recent Labs  Lab 10/18/21 1141 10/18/21 1340 10/18/21 1747 10/18/21 2104 10/19/21 0733  GLUCAP 164* 164* 160* 192* 257*     Microbiology: Results  for orders placed or performed during the hospital encounter of 10/11/2021  Resp Panel by RT-PCR (Flu A&B, Covid) Nasopharyngeal Swab     Status: None   Collection Time: 10/22/2021  5:18 PM   Specimen: Nasopharyngeal Swab; Nasopharyngeal(NP) swabs in vial transport medium  Result Value Ref Range Status   SARS Coronavirus 2 by RT PCR NEGATIVE NEGATIVE Final    Comment: (NOTE) SARS-CoV-2 target nucleic acids are NOT DETECTED.  The SARS-CoV-2 RNA is generally detectable in upper respiratory specimens during the acute phase of infection. The lowest concentration of SARS-CoV-2 viral copies this assay can detect is 138 copies/mL. A negative result does not preclude SARS-Cov-2 infection and should not be used as the sole basis for treatment or other patient management decisions. A negative result may occur with  improper specimen collection/handling, submission of specimen other than nasopharyngeal swab,  presence of viral mutation(s) within the areas targeted by this assay, and inadequate number of viral copies(<138 copies/mL). A negative result must be combined with clinical observations, patient history, and epidemiological information. The expected result is Negative.  Fact Sheet for Patients:  EntrepreneurPulse.com.au  Fact Sheet for Healthcare Providers:  IncredibleEmployment.be  This test is no t yet approved or cleared by the [place] FDA and  has been authorized for detection and/or diagnosis of SARS-CoV-2 by FDA under an Emergency Use Authorization (EUA). This EUA will remain  in effect (meaning this test can be used) for the duration of the COVID-19 declaration under Section 564(b)(1) of the Act, 21 U.S.C.section 360bbb-3(b)(1), unless the authorization is terminated  or revoked sooner.       Influenza A by PCR NEGATIVE NEGATIVE Final   Influenza B by PCR NEGATIVE NEGATIVE Final    Comment: (NOTE) The Xpert Xpress SARS-CoV-2/FLU/RSV plus assay is intended as an aid in the diagnosis of influenza from Nasopharyngeal swab specimens and should not be used as a sole basis for treatment. Nasal washings and aspirates are unacceptable for Xpert Xpress SARS-CoV-2/FLU/RSV testing.  Fact Sheet for Patients: EntrepreneurPulse.com.au  Fact Sheet for Healthcare Providers: IncredibleEmployment.be  This test is not yet approved or cleared by the [place] FDA and has been authorized for detection and/or diagnosis of SARS-CoV-2 by FDA under an Emergency Use Authorization (EUA). This EUA will remain in effect (meaning this test can be used) for the duration of the COVID-19 declaration under Section 564(b)(1) of the Act, 21 U.S.C. section 360bbb-3(b)(1), unless the authorization is terminated or revoked.  Performed at [facility], [address] [place], [place] [address]   Body fluid  culture w Gram Stain     Status: None (Preliminary result)   Collection Time: 10/13/2021 11:40 PM   Specimen: Peritoneal Washings; Body Fluid  Result Value Ref Range Status   Specimen Description   Final    PERITONEAL DIALYSIS Performed at [facility], [address] [place], [place] [address]    Special Requests   Final    NONE Performed at [facility], [address] [place], [place] [address]    Gram Stain   Final    NO SQUAMOUS EPITHELIAL CELLS SEEN NO WBC SEEN NO ORGANISMS SEEN    Culture   Final    NO GROWTH 1 DAY Performed at [facility], [place] [address] [place], [place] [address]    Report Status PENDING  Incomplete  MRSA Next Gen by PCR, Nasal     Status: None   Collection Time: 10/18/21  1:40 PM  Specimen: Nasal Mucosa; Nasal Swab  Result Value Ref Range Status   MRSA by PCR Next Gen NOT DETECTED NOT DETECTED Final    Comment: (NOTE) The GeneXpert MRSA Assay (FDA approved for NASAL specimens only), is one component of a comprehensive MRSA colonization surveillance program. It is not intended to diagnose MRSA infection nor to guide or monitor treatment for MRSA infections. Test performance is not FDA approved in patients less than 3 years old. Performed at [facility], [address] [place], [place] [address]     Coagulation Studies: Recent Labs    10/11/2021 1759  LABPROT 16.5*  INR 1.3*     Urinalysis: No results for input(s): COLORURINE, LABSPEC, PHURINE, GLUCOSEU, HGBUR, BILIRUBINUR, KETONESUR, PROTEINUR, UROBILINOGEN, NITRITE, LEUKOCYTESUR in the last 72 hours.  Invalid input(s): APPERANCEUR    Imaging: DG CHEST PORT 1 VIEW  Result Date: 10/19/2021 CLINICAL DATA:  New onset atrial fibrillation EXAM: PORTABLE CHEST 1 VIEW COMPARISON:  August 17, 2022 FINDINGS: The heart size and mediastinal contours are stable. Aortic atherosclerosis. Pulmonary vascular congestion. No overt pulmonary edema or focal  airspace consolidation. No visible pleural effusion or pneumothorax. The visualized skeletal structures are unchanged. IMPRESSION: Pulmonary vascular congestion without overt pulmonary edema or focal airspace consolidation. Electronically Signed   By: Dahlia Bailiff M.D.   On: 10/19/2021 08:06   DG Chest Port 1 View  Result Date: 10/18/2021 CLINICAL DATA:  Hypoxia. EXAM: PORTABLE CHEST 1 VIEW COMPARISON:  11/01/2021 FINDINGS: Stable cardiomediastinal contours. Aortic atherosclerosis noted. Pulmonary vascular congestion. No pleural effusion or airspace consolidation. IMPRESSION: Pulmonary vascular congestion. Electronically Signed   By: Kerby Moors M.D.   On: 10/18/2021 09:26     Medications:    dialysis solution 1.5% low-MG/low-CA     heparin 750 Units/hr (10/19/21 0800)   potassium chloride      Chlorhexidine Gluconate Cloth  6 each Topical Daily   cloNIDine  0.2 mg Transdermal Weekly   dexamethasone (DECADRON) injection  6 mg Intravenous Q6H   gentamicin cream  1 application Topical Daily   hydrALAZINE  100 mg Oral Q8H   insulin aspart  0-5 Units Subcutaneous QHS   insulin aspart  0-6 Units Subcutaneous TID WC   insulin glargine-yfgn  5 Units Subcutaneous Daily   isosorbide mononitrate  60 mg Oral Daily   pantoprazole (PROTONIX) IV  40 mg Intravenous Q24H   acetaminophen **OR** acetaminophen, diphenhydrAMINE, dianeal solution for CAPD/CCPD with heparin, hydrALAZINE, LORazepam, ondansetron **OR** ondansetron (ZOFRAN) IV  Assessment/ Plan:  Catherine Burke is a 81 y.o. white female with end stage renal disease on peritoneal dialysis, diabetes mellitus type II insulin dependent, hypertension, and hyperlipidemia who is admitted to [facility] on 10/23/2021 for Facial swelling [R22.0] Angioedema, initial encounter [T78.3XXA] Fever, unspecified fever cause [R50.9] Angioedema [T78.3XXA]  [facility]  59kg CCPD 5 exchanges 2 liter fills 9 hours  End Stage  Renal Disease:  Tolerating PD well.  Small amount of fibrin noted by nursing staff.  They report heparin is helping with better flows.  Hypertension:   ACE inhibitor and ARB on hold  Anemia of chronic kidney disease:  Mircera as outpatient. Will monitor  4. Angioedema, believed due to prescribed ACE-i, currently held.   Currently being monitored in the ICU 5.  Hypokalemia,  Likely nutritional Expected to correct when she is able to eat normal diet.   LOS: 1 Terril Amaro 2/15/20238:59 AM

## 2021-10-19 NOTE — TOC Initial Note (Signed)
Transition of Care [facility]) - Initial/Assessment Note    Patient Details  Name: Catherine Burke MRN: [id number] Date of Birth: 1940-10-25  [facility]) CM/SW Contact:    Shelbie Hutching, RN Phone Number: 10/19/2021, 4:07 PM  Clinical Narrative:                 Patient admitted to the hospital for angioedema.  Patient has a history of ESRD on peritoneal dialysis.  Patient currently in the ICU stepdown status.  RNCM met with patient and patient's daughter at the bedside, patient is sleeping eyes closed.  Daughter, Lattie Haw reports that patient lives alone and does her own PD at home, she has a cane, walker, and wheelchair.  Some days patient does not require the use of any DME but others will need to use the cane or walker and when she goes out to appointments they use the wheelchair.   Daughter reports that she and her brother have been thinking that the patient should not live alone anymore.  Patient's son is planning on moving in with the patient. RNCM informed daughter that if recommended home health can be arranged at discharge or short term rehab.  Daughter is in agreement with either if needed.    TOC will cont to follow and assist with disposition.      Expected Discharge Plan: Skilled Nursing Facility Barriers to Discharge: Continued Medical Work up   Patient Goals and CMS Choice Patient states their goals for this hospitalization and ongoing recovery are:: For patient to get better CMS Medicare.gov Compare Post Acute Care list provided to:: Patient Choice offered to / list presented to : Patient  Expected Discharge Plan and Services Expected Discharge Plan: [place]   Discharge Planning Services: CM Consult   Living arrangements for the past 2 months: Single Family Home                 DME Arranged: N/A DME Agency: NA                  Prior Living Arrangements/Services Living arrangements for the past 2 months: Single Family Home Lives  with:: Self Patient language and need for interpreter reviewed:: Yes Do you feel safe going back to the place where you live?: Yes      Need for Family Participation in Patient Care: Yes (Comment) (ESRD, facial edema) Care giver support system in place?: Yes (comment) (daughter and son) Current home services: DME (Cane, walker, and wheelchair) Criminal Activity/Legal Involvement Pertinent to Current Situation/Hospitalization: No - Comment as needed  Activities of Daily Living Home Assistive Devices/Equipment: Cane (specify quad or straight) (quad cane) ADL Screening (condition at time of admission) Patient's cognitive ability adequate to safely complete daily activities?: Yes Is the patient deaf or have difficulty hearing?: No Does the patient have difficulty seeing, even when wearing glasses/contacts?: No Does the patient have difficulty concentrating, remembering, or making decisions?: No Patient able to express need for assistance with ADLs?: Yes Does the patient have difficulty dressing or bathing?: No Independently performs ADLs?: Yes (appropriate for developmental age) Does the patient have difficulty walking or climbing stairs?: No Weakness of Legs: None Weakness of Arms/Hands: None  Permission Sought/Granted Permission sought to share information with : Case Manager, Family Supports Permission granted to share information with : Yes, Verbal Permission Granted  Share Information with NAME: Farrel Demark     Permission granted to share info w Relationship: daughter  Permission granted to share info  w Contact Information: [phone]  Emotional Assessment Appearance:: Appears stated age Attitude/Demeanor/Rapport: Lethargic Affect (typically observed): Unable to Assess   Alcohol / Substance Use: Not Applicable Psych Involvement: No (comment)  Admission diagnosis:  Facial swelling [R22.0] Angioedema, initial encounter [T78.3XXA] Fever, unspecified fever cause  [R50.9] Angioedema [T78.3XXA] Patient Active Problem List   Diagnosis Date Noted   Malnutrition of moderate degree 10/18/2021   Angioedema 10/18/2021   Angioedema, initial encounter 10/08/2021   Facial cellulitis 10/16/2021   Abdominal pain 10/13/2021   PD catheter dysfunction, initial encounter ([place])    Cellulitis 07/20/2021   Abdominal wall cellulitis 07/20/2021   B12 deficiency 04/04/2021   Symptomatic anemia 07/22/2020   Acute on chronic anemia 07/21/2020   ESRD (end stage renal disease) ([place]) 07/21/2020   Peritoneal dialysis status ([place]) 07/21/2020   Chronic kidney disease (CKD), stage IV (severe) (H[facility]) 02/08/2018   Proteinuria 10/10/2017   Chronic kidney disease (CKD), stage III (moderate) ([place]) 02/24/2015   Kidney stones    Osteoporosis    GERD (gastroesophageal reflux disease)    Diabetes mellitus ([place]) 01/17/2007   Hyperlipidemia 01/17/2007   Essential hypertension 01/17/2007   OSTEOPENIA 01/17/2007   HYSTERECTOMY, PARTIAL, HX OF 01/17/2007   PCP:  Idelle Crouch, MD Pharmacy:   [facility] [place] [facility] [employer] [place] [address] Phone: [phone] Fax: [phone]     Social Determinants of Health (SDOH) Interventions    Readmission Risk Interventions Readmission Risk Prevention Plan 10/19/2021  Transportation Screening Complete  Medication Review (RN Care Manager) Complete  PCP or Specialist appointment within 3-5 days of discharge Complete  HRI or Home Care Consult Complete  SW Recovery Care/Counseling Consult Complete  Palliative Care Screening Not Applicable  Skilled Nursing Facility Complete  Some recent data might be hidden

## 2021-10-19 NOTE — Progress Notes (Addendum)
Inpatient Diabetes Program Recommendations  AACE/ADA: New Consensus Statement on Inpatient Glycemic Control (2015)  Target Ranges:  Prepandial:   less than 140 mg/dL      Peak postprandial:   less than 180 mg/dL (1-2 hours)      Critically ill patients:  140 - 180 mg/dL    Latest Reference Range & Units 10/18/21 07:41 10/18/21 11:41 10/18/21 13:40 10/18/21 17:47 10/18/21 21:04  Glucose-Capillary 70 - 99 mg/dL 238 (H) 164 (H) 164 (H) 160 (H) 192 (H)    Latest Reference Range & Units 10/19/21 07:33  Glucose-Capillary 70 - 99 mg/dL 257 (H)  (H): Data is abnormally high   Home DM Meds: Lantus 5 units QHS    Current Orders: Semglee 5 units Daily  Novolog 0-6 units TID ac/hs    MD- Note initiation of Decadron 6 mg Q6 hours and Transfer to ICU due to worsening Edema.  Please consider:  1. Increase Semglee to 7 units Daily (if 5 unit dose already given this AM, please also give an extra 2 units X 1 dose this AM)  2. Increase the Strength and Frequency of the Novolog SSI to the 0-9 unit scale and increase frequency to Q4 hours while remains on Decadron     --Will follow patient during hospitalization--  Wyn Quaker RN, MSN, CDE Diabetes Coordinator Inpatient Glycemic Control Team Team Pager: [phone] (8a-5p)

## 2021-10-19 NOTE — Progress Notes (Signed)
PD initiated. Site no infection, PD catheter intact. Cycler went into first fill without issue.

## 2021-10-19 NOTE — Progress Notes (Signed)
ANTICOAGULATION CONSULT NOTE - Initial Consult  Pharmacy Consult for Heparin  Indication: atrial fibrillation  Allergies  Allergen Reactions   Ace Inhibitors Swelling   Levofloxacin Other (See Comments)    Throat Swelling    Penicillins Anaphylaxis   Other    Penicillin G Other (See Comments)   Sulfa Antibiotics     Patient Measurements: Height: 5\' 4"  (162.6 cm) Weight: 55.4 kg (122 lb 2.2 oz) IBW/kg (Calculated) : 54.7 Heparin Dosing Weight: 54.3 kg   Vital Signs: Temp: 97.7 F (36.5 C) (02/14 2300) Temp Source: Axillary (02/14 2300) BP: 162/80 (02/15 0400) Pulse Rate: 101 (02/15 0400)  Labs: Recent Labs    10/10/2021 1621 10/21/2021 1759 10/17/21 0503  HGB 9.9*  --  8.4*  HCT 32.2*  --  26.7*  PLT 310  --  266  APTT  --  50*  --   LABPROT  --  16.5*  --   INR  --  1.3*  --   CREATININE  --  7.71* 6.80*    Estimated Creatinine Clearance: 5.5 mL/min (A) (by C-G formula based on SCr of 6.8 mg/dL (H)).   Medical History: Past Medical History:  Diagnosis Date   Anemia    Arthritis    B12 deficiency 04/04/2021   Diabetes mellitus    GERD (gastroesophageal reflux disease)    HLD (hyperlipidemia)    Hypercholesterolemia    Hypertension    Kidney stones    Osteoporosis    Proteinuria     Medications:   Assessment: Pharmacy consulted to dose heparin in this 81 year old female with new onset Afib.  Pt was on heparin 5000 units SQ Q8H previously, last dose on 2/15 @ ~ 0500.   CrCl = 5.5 ml/min   Goal of Therapy:  Heparin level 0.3-0.7 units/ml Monitor platelets by anticoagulation protocol: Yes   Plan:  Pt received heparin 5000 units SQ on 2/15 @ 0500.  Will not bolus this pt but begin drip at 750 units/hr.  Will check HL 8 hrs after start of drip. Will check CBC daily.   Matther Labell D 10/19/2021,5:12 AM

## 2021-10-19 NOTE — Progress Notes (Signed)
° °      CROSS COVER NOTE  NAME: Catherine Burke MRN: [id number] DOB : [birth year]   Secure chat received from RN reporting tachycardia with rates in the 110s.  Plan  Paroxysmal Atrial Fibrillation, rate 116  - EKG and bedside telemetry reviewed - Diltiazem 10mg  ordered x1 (beta blockers avoided considering airway concerns) - CHA2DS2VASc score=5; Heparin per pharmacy  - Troponin - TSH - CXR - Consider ECHO  Josephina Shih, MSN, FNP-BC Nurse Practitioner [facility] Pager [phone]

## 2021-10-19 NOTE — Progress Notes (Addendum)
NAME:  Catherine Burke, MRN:  [id number], DOB:  1940-10-18, LOS: 1 ADMISSION DATE:  10/06/2021   History of Present Illness:  81 year old female with a past medical history of end-stage renal disease on peritoneal dialysis, hypertension, hyperlipidemia and insulin-dependent diabetes mellitus who presented to the emergency room on 2/12 with right facial swelling.  There was initial concern for sepsis due to fever and elevated lactate of 2.  A CT soft tissue of neck without contrast was done which revealed right lower facial soft swelling and possible cellulitis.  No drainable fluid collection.  Per the medical record, the lip started swelling on 10/15/2021. No changes to medicationns.  She received IV steroids and was started on vancomycin and cefepime.  There was reports of ACE inhibitor use which was discontinued.  She was admitted to the hospitalist service for observation and monitoring.   This morning 2/14, patient with worsening face, tongue and lip swelling.  Swelling is progressing despite treatment with steroids, Benadryl and Pepcid.  The decision was made to move the patient to the ICU and critical care was consulted.   Patient seen and examined while still on the floor.  She is alert and oriented x3.  At the time of our evaluation, patient was laying flat in bed with mild respiratory distress and saying it was hard to breathe.  Tongue very swollen.  Very mild stridor on auscultation.  Set patient up in bed and patient was able to suction oral airway.  At this time, there is no need for emergent intubation however patient does require closer monitoring and will therefore be moved to the ICU.   Pertinent  Medical History  Hypertension End-stage renal disease on PD Diabetes mellitus GERD B12 deficiency Hyperlipidemia Kidney stones   Significant Hospital Events: Including procedures, antibiotic start and stop dates in addition to other pertinent events   2/12: Admitted to the hospitalist  service for facial swelling  2/14: Transferred to ICU due to progression of swelling despite steroids, benadryl and pepcid 2/15 air way stable, slightly less swelling    Antimicrobials:   Antibiotics Given (last 72 hours)     Date/Time Action Medication Dose Rate   10/15/2021 1728 New Bag/Given   ceFEPIme (MAXIPIME) 2 g in sodium chloride 0.9 % 100 mL IVPB 2 g 200 mL/hr   10/12/2021 1821 New Bag/Given   metroNIDAZOLE (FLAGYL) IVPB 500 mg 500 mg 100 mL/hr   10/23/2021 2002 New Bag/Given   vancomycin (VANCOCIN) IVPB 1000 mg/200 mL premix 1,000 mg 200 mL/hr   10/17/21 1734 New Bag/Given   ceFEPIme (MAXIPIME) 1 g in sodium chloride 0.9 % 100 mL IVPB 1 g 200 mL/hr            Interim History / Subjective:  Sleep this AM Protecting airway Lips are NOT swollen Tongue swelling seems a tad bit better      Objective   Blood pressure (!) 142/68, pulse 96, temperature 97.7 F (36.5 C), temperature source Axillary, resp. rate 16, height 5\' 4"  (1.626 m), weight 55.4 kg, SpO2 99 %.        Intake/Output Summary (Last 24 hours) at 10/19/2021 0745 Last data filed at 10/19/2021 0600 Gross per 24 hour  Intake 11208.5 ml  Output 10366 ml  Net 842.5 ml   Filed Weights   10/18/21 1337 10/18/21 1709 10/19/21 0500  Weight: 53.9 kg 53.9 kg 55.4 kg    ROS Sleepy this AM  Physical Examination:   General Appearance: No distress  EYES  PERRLA, EOM intact.   NECK Supple, No JVD, TONGUE SWELLING Pulmonary: normal breath sounds, No wheezing.  CardiovascularNormal S1,S2.  No m/r/g.   Abdomen: Benign, Soft, non-tender. Skin:   warm, no rashes, no ecchymosis  Extremities: normal, no cyanosis, clubbing. Neuro:without focal findings,  speech normal  PSYCHIATRIC: Mood, affect within normal limits.   Labs/imaging that I havepersonally reviewed  (right click and "Reselect all SmartList Selections" daily)      ASSESSMENT AND PLAN SYNOPSIS  Acute angioedema without respiratory distress  of significant airway compromise   Continue to monitor SD status Continue steroids NEBs as needed Frequent airway suctioning as needed Keep NPO   CARDIAC ICU monitoring   ELECTROLYTES -follow labs as needed -replace as needed -pharmacy consultation and following     Best practice (right click and "Reselect all SmartList Selections" daily)  Diet:  NPO Mobility:  bed rest  Code Status:  DNR Disposition: SD statu  Labs   CBC: Recent Labs  Lab 10/13/21 1136 10/15/21 2200 10/13/2021 1621 10/17/21 0503  WBC 6.7 5.2 4.0 5.7  NEUTROABS  --  4.6 3.4 5.4  HGB 9.3* 8.2* 9.9* 8.4*  HCT 29.6* 26.4* 32.2* 26.7*  MCV 88.9 89.8 90.4 88.7  PLT 211 246 310 720    Basic Metabolic Panel: Recent Labs  Lab 10/13/21 1136 10/13/21 1651 10/15/21 2200 10/11/2021 1759 10/17/2021 2158 10/17/21 0503 10/19/21 0458  NA 135  --  133* 132*  --  132* 132*  K 3.5  --  3.7 3.9  --  3.6 3.2*  CL 98  --  98 97*  --  96* 96*  CO2 27  --  24 23  --  23 25  GLUCOSE 211*  --  180* 255*  --  327* 279*  BUN 37*  --  49* 59*  --  54* 51*  CREATININE 5.76*  --  6.62* 7.71*  --  6.80* 5.55*  CALCIUM 8.1*  --  7.3* 7.2*  --  7.2* 7.2*  MG  --  1.8  --   --  1.7  --  1.6*  PHOS  --   --   --   --   --  5.2*  --    GFR: Estimated Creatinine Clearance: 6.7 mL/min (A) (by C-G formula based on SCr of 5.55 mg/dL (H)). Recent Labs  Lab 10/13/21 1136 10/15/21 2200 10/20/2021 0057 10/18/2021 1621 10/07/2021 1718 10/23/2021 2158 10/17/21 0503  PROCALCITON  --   --   --   --   --  6.86  --   WBC 6.7 5.2  --  4.0  --   --  5.7  LATICACIDVEN  --  1.0 0.7  --  2.0* 2.2*  --     Liver Function Tests: Recent Labs  Lab 10/15/21 2200 10/24/2021 1759  AST 24 26  ALT 10 10  ALKPHOS 65 59  BILITOT 0.7 0.5  PROT 6.0* 5.9*  ALBUMIN 1.9* 1.9*   No results for input(s): LIPASE, AMYLASE in the last 168 hours. No results for input(s): AMMONIA in the last 168 hours.  ABG No results found for: PHART, PCO2ART,  PO2ART, HCO3, TCO2, ACIDBASEDEF, O2SAT   Coagulation Profile: Recent Labs  Lab 10/15/21 2248 10/21/2021 1759  INR 1.2 1.3*    Cardiac Enzymes: No results for input(s): CKTOTAL, CKMB, CKMBINDEX, TROPONINI in the last 168 hours.  HbA1C: Hgb A1C (fingerstick)  Date/Time Value Ref Range Status  02/24/2015 08:53 AM 13.0 (H) <5.7 % Final  Comment:                                                                           According to the ADA Clinical Practice Recommendations for 2011, when HbA1c is used as a screening test:     >=6.5%   Diagnostic of Diabetes Mellitus            (if abnormal result is confirmed)   5.7-6.4%   Increased risk of developing Diabetes Mellitus   References:Diagnosis and Classification of Diabetes Mellitus,Diabetes JJKK,9381,82(XHBZJ 1):S62-S69 and Standards of Medical Care in         Diabetes - 2011,Diabetes IRCV,8938,10 (Suppl 1):S11-S61.      Hgb A1c MFr Bld  Date/Time Value Ref Range Status  10/17/2021 05:03 AM 5.7 (H) 4.8 - 5.6 % Final    Comment:    (NOTE) Pre diabetes:          5.7%-6.4%  Diabetes:              >6.4%  Glycemic control for   <7.0% adults with diabetes   07/20/2021 03:19 PM 6.1 (H) 4.8 - 5.6 % Final    Comment:    (NOTE) Pre diabetes:          5.7%-6.4%  Diabetes:              >6.4%  Glycemic control for   <7.0% adults with diabetes     CBG: Recent Labs  Lab 10/18/21 1141 10/18/21 1340 10/18/21 1747 10/18/21 2104 10/19/21 0733  GLUCAP 164* 164* 160* 192* 257*    Allergies Allergies  Allergen Reactions   Ace Inhibitors Swelling   Levofloxacin Other (See Comments)    Throat Swelling    Penicillins Anaphylaxis   Other    Penicillin G Other (See Comments)   Sulfa Antibiotics        DVT/GI PRX  assessed I Assessed the need for Labs I Assessed the need for Foley I Assessed the need for Central Venous Line Family Discussion when available I Assessed the need for Mobilization I made an Assessment  of medications to be adjusted accordingly Safety Risk assessment completed  CASE DISCUSSED IN MULTIDISCIPLINARY ROUNDS WITH ICU TEAM   Lynleigh Kovack Patricia Pesa, M.D.  [facility]  Medical Director [facility] Director [facility]

## 2021-10-19 NOTE — Progress Notes (Signed)
ANTICOAGULATION CONSULT NOTE  Pharmacy Consult for Heparin  Indication: atrial fibrillation  Allergies  Allergen Reactions   Ace Inhibitors Swelling   Levofloxacin Other (See Comments)    Throat Swelling    Penicillins Anaphylaxis   Other    Penicillin G Other (See Comments)   Sulfa Antibiotics     Patient Measurements: Height: 5\' 4"  (162.6 cm) Weight: 55.4 kg (122 lb 2.2 oz) IBW/kg (Calculated) : 54.7 Heparin Dosing Weight: 54.3 kg   Vital Signs: Temp: 98 F (36.7 C) (02/15 1500) Temp Source: Axillary (02/15 1500) BP: 162/74 (02/15 1600) Pulse Rate: 90 (02/15 1600)  Labs: Recent Labs    10/29/2021 1759 10/17/21 0503 10/19/21 0458 10/19/21 1531  HGB  --  8.4*  --   --   HCT  --  26.7*  --   --   PLT  --  266  --   --   APTT 50*  --   --   --   LABPROT 16.5*  --   --   --   INR 1.3*  --   --   --   HEPARINUNFRC  --   --   --  0.10*  CREATININE 7.71* 6.80* 5.55*  --   TROPONINIHS  --   --  85*  --      Estimated Creatinine Clearance: 6.7 mL/min (A) (by C-G formula based on SCr of 5.55 mg/dL (H)).   Medical History: Past Medical History:  Diagnosis Date   Anemia    Arthritis    B12 deficiency 04/04/2021   Diabetes mellitus    GERD (gastroesophageal reflux disease)    HLD (hyperlipidemia)    Hypercholesterolemia    Hypertension    Kidney stones    Osteoporosis    Proteinuria     Medications:   Assessment: Pharmacy consulted to dose heparin in this 81 year old female with new onset Afib.  Pt was on heparin 5000 units SQ Q8H previously, last dose on 2/15 @ ~ 0500.    2/15 1531 HL 0.1   Goal of Therapy:  Heparin level 0.3-0.7 units/ml Monitor platelets by anticoagulation protocol: Yes   Plan:  Heparin level is subtherapeutic. Will give heparin bolus of 1700 units x 1 and increase heparin infusion to 900 units/hr. Recheck heparin level in 8 hours. CBC daily while on heparin.    Oswald Hillock, PharmD, BCPS 10/19/2021,5:01 PM

## 2021-10-20 DIAGNOSIS — T783XXA Angioneurotic edema, initial encounter: Secondary | ICD-10-CM | POA: Diagnosis not present

## 2021-10-20 LAB — CULTURE, BLOOD (ROUTINE X 2)
Culture: NO GROWTH
Culture: NO GROWTH
Special Requests: ADEQUATE
Special Requests: ADEQUATE

## 2021-10-20 LAB — HEPARIN LEVEL (UNFRACTIONATED)
Heparin Unfractionated: 0.15 IU/mL — ABNORMAL LOW (ref 0.30–0.70)
Heparin Unfractionated: 0.22 IU/mL — ABNORMAL LOW (ref 0.30–0.70)

## 2021-10-20 LAB — CBC
HCT: 27.6 % — ABNORMAL LOW (ref 36.0–46.0)
Hemoglobin: 8.9 g/dL — ABNORMAL LOW (ref 12.0–15.0)
MCH: 27.4 pg (ref 26.0–34.0)
MCHC: 32.2 g/dL (ref 30.0–36.0)
MCV: 84.9 fL (ref 80.0–100.0)
Platelets: 263 10*3/uL (ref 150–400)
RBC: 3.25 MIL/uL — ABNORMAL LOW (ref 3.87–5.11)
RDW: 16.7 % — ABNORMAL HIGH (ref 11.5–15.5)
WBC: 16.4 10*3/uL — ABNORMAL HIGH (ref 4.0–10.5)
nRBC: 0 % (ref 0.0–0.2)

## 2021-10-20 LAB — GLUCOSE, CAPILLARY
Glucose-Capillary: 238 mg/dL — ABNORMAL HIGH (ref 70–99)
Glucose-Capillary: 205 mg/dL — ABNORMAL HIGH (ref 70–99)
Glucose-Capillary: 145 mg/dL — ABNORMAL HIGH (ref 70–99)
Glucose-Capillary: 256 mg/dL — ABNORMAL HIGH (ref 70–99)

## 2021-10-20 LAB — BODY FLUID CULTURE W GRAM STAIN
Culture: NO GROWTH
Gram Stain: NONE SEEN

## 2021-10-20 LAB — BASIC METABOLIC PANEL
Anion gap: 11 (ref 5–15)
BUN: 59 mg/dL — ABNORMAL HIGH (ref 8–23)
CO2: 26 mmol/L (ref 22–32)
Calcium: 7.8 mg/dL — ABNORMAL LOW (ref 8.9–10.3)
Chloride: 95 mmol/L — ABNORMAL LOW (ref 98–111)
Creatinine, Ser: 5.3 mg/dL — ABNORMAL HIGH (ref 0.44–1.00)
GFR, Estimated: 8 mL/min — ABNORMAL LOW (ref >60)
Glucose, Bld: 236 mg/dL — ABNORMAL HIGH (ref 70–99)
Potassium: 3.3 mmol/L — ABNORMAL LOW (ref 3.5–5.1)
Sodium: 132 mmol/L — ABNORMAL LOW (ref 135–145)

## 2021-10-20 LAB — MAGNESIUM: Magnesium: 2.3 mg/dL (ref 1.7–2.4)

## 2021-10-20 MED ORDER — HEPARIN SODIUM (PORCINE) 5000 UNIT/ML IJ SOLN
5000.0000 [IU] | Freq: Three times a day (TID) | INTRAMUSCULAR | Status: DC
  Administered 2021-10-20 – 2021-10-24 (×13): 5000 [IU] via SUBCUTANEOUS
  Filled 2021-10-20 (×13): qty 1

## 2021-10-20 MED ORDER — ENOXAPARIN SODIUM 40 MG/0.4ML IJ SOSY
40.0000 mg | PREFILLED_SYRINGE | INTRAMUSCULAR | Status: DC
Start: 2021-10-20 — End: 2021-10-20

## 2021-10-20 MED ORDER — INSULIN GLARGINE-YFGN 100 UNIT/ML ~~LOC~~ SOLN
7.0000 [IU] | Freq: Every day | SUBCUTANEOUS | Status: DC
  Administered 2021-10-21 – 2021-10-22 (×2): 7 [IU] via SUBCUTANEOUS
  Filled 2021-10-20 (×3): qty 0.07

## 2021-10-20 MED ORDER — INSULIN ASPART 100 UNIT/ML IJ SOLN
0.0000 [IU] | Freq: Every day | INTRAMUSCULAR | Status: DC
  Administered 2021-10-20: 22:00:00 3 [IU] via SUBCUTANEOUS
  Administered 2021-10-21: 22:00:00 2 [IU] via SUBCUTANEOUS
  Administered 2021-10-22 – 2021-10-24 (×2): 3 [IU] via SUBCUTANEOUS
  Administered 2021-10-28: 2 [IU] via SUBCUTANEOUS
  Filled 2021-10-20 (×5): qty 1

## 2021-10-20 MED ORDER — POTASSIUM CHLORIDE 10 MEQ/100ML IV SOLN
10.0000 meq | INTRAVENOUS | Status: AC
  Administered 2021-10-20 (×2): 10 meq via INTRAVENOUS
  Filled 2021-10-20 (×2): qty 100

## 2021-10-20 MED ORDER — HEPARIN BOLUS VIA INFUSION
1600.0000 [IU] | Freq: Once | INTRAVENOUS | Status: AC
  Administered 2021-10-20: 1600 [IU] via INTRAVENOUS
  Filled 2021-10-20: qty 1600

## 2021-10-20 MED ORDER — HEPARIN BOLUS VIA INFUSION
1000.0000 [IU] | Freq: Once | INTRAVENOUS | Status: AC
  Administered 2021-10-20: 1000 [IU] via INTRAVENOUS
  Filled 2021-10-20: qty 1000

## 2021-10-20 MED ORDER — DARBEPOETIN ALFA 100 MCG/0.5ML IJ SOSY
100.0000 ug | PREFILLED_SYRINGE | INTRAMUSCULAR | Status: DC
  Administered 2021-10-20 – 2021-10-27 (×2): 100 ug via SUBCUTANEOUS
  Filled 2021-10-20 (×4): qty 0.5

## 2021-10-20 MED ORDER — INSULIN ASPART 100 UNIT/ML IJ SOLN
0.0000 [IU] | Freq: Three times a day (TID) | INTRAMUSCULAR | Status: DC
  Administered 2021-10-21: 08:00:00 5 [IU] via SUBCUTANEOUS
  Administered 2021-10-21: 14:00:00 2 [IU] via SUBCUTANEOUS
  Administered 2021-10-21: 1 [IU] via SUBCUTANEOUS
  Administered 2021-10-22 (×3): 5 [IU] via SUBCUTANEOUS
  Administered 2021-10-23: 7 [IU] via SUBCUTANEOUS
  Administered 2021-10-23: 18:00:00 2 [IU] via SUBCUTANEOUS
  Administered 2021-10-23: 9 [IU] via SUBCUTANEOUS
  Administered 2021-10-24 (×2): 3 [IU] via SUBCUTANEOUS
  Administered 2021-10-24: 1 [IU] via SUBCUTANEOUS
  Filled 2021-10-20 (×9): qty 1

## 2021-10-20 MED ORDER — SODIUM CHLORIDE 0.9 % IV SOLN: INTRAVENOUS | Status: AC

## 2021-10-20 MED ORDER — LABETALOL HCL 5 MG/ML IV SOLN
10.0000 mg | Freq: Three times a day (TID) | INTRAVENOUS | Status: DC
  Administered 2021-10-20 – 2021-10-24 (×11): 10 mg via INTRAVENOUS
  Filled 2021-10-20 (×11): qty 4

## 2021-10-20 NOTE — Progress Notes (Signed)
ANTICOAGULATION CONSULT NOTE  Pharmacy Consult for Heparin  Indication: atrial fibrillation  Allergies  Allergen Reactions   Ace Inhibitors Swelling   Levofloxacin Other (See Comments)    Throat Swelling    Penicillins Anaphylaxis   Other    Penicillin G Other (See Comments)   Sulfa Antibiotics     Patient Measurements: Height: 5\' 4"  (162.6 cm) Weight: 54.6 kg (120 lb 5.9 oz) IBW/kg (Calculated) : 54.7 Heparin Dosing Weight: 54.3 kg   Vital Signs: Temp: 98.1 F (36.7 C) (02/15 2000) Temp Source: Axillary (02/15 2000) BP: 158/83 (02/16 0600) Pulse Rate: 96 (02/16 0600)  Labs: Recent Labs    10/19/21 0458 10/19/21 1531 10/20/21 0034 10/20/21 0440  HGB  --   --   --  8.9*  HCT  --   --   --  27.6*  PLT  --   --   --  263  HEPARINUNFRC  --  0.10* 0.15*  --   CREATININE 5.55*  --   --  5.30*  TROPONINIHS 85*  --   --   --      Estimated Creatinine Clearance: 7.1 mL/min (A) (by C-G formula based on SCr of 5.3 mg/dL (H)).   Medical History: Past Medical History:  Diagnosis Date   Anemia    Arthritis    B12 deficiency 04/04/2021   Diabetes mellitus    GERD (gastroesophageal reflux disease)    HLD (hyperlipidemia)    Hypercholesterolemia    Hypertension    Kidney stones    Osteoporosis    Proteinuria     Assessment: Pharmacy consulted to dose heparin in this 81 year old female with new onset Afib.  PMH includes ESRD on PD, HTN, DM, DM. She was admitted w/ facial swelling. H&H, platelets stable  Goal of Therapy:  Heparin level 0.3-0.7 units/ml Monitor platelets by anticoagulation protocol: Yes   Plan:  Heparin level remains SUBtherapeutic despite recent rate increase: Will order heparin 1000 units IV X 1 and increase drip rate to 1200 units/hr  Will recheck heparin level 8 hrs after rate change Dilay CBC while on IV heparin per protocol  Dallie Piles, PharmD 10/20/2021,7:09 AM

## 2021-10-20 NOTE — Progress Notes (Signed)
Speech Language Pathology Treatment: Dysphagia  Patient Details Name: Catherine Burke MRN: [id number] DOB: 13-May-1941 Today's Date: 10/20/2021 Time: 8242-3536 SLP Time Calculation (min) (ACUTE ONLY): 35 min  Assessment / Plan / Recommendation Clinical Impression  Pt seen today for ongoing assessment of readiness to initiate po's again, in hopes to establish an oral diet. Family was not present this session. Pt was resting in bed; positioning support w/ pillows given for more upright sitting. Pt appeared calmer w/ less discomfort w/ her breathing today vs previous session.  Pt was moved to the CCU d/t concerns of increasing oropharyngeal angioedema and need to monitor her airway. She has not required airway support but continues to receive medications to address the angioedema. Pt remains NPO d/t HIGH risk for aspiration from oropharyngeal phase dysphagia in light of lingual angioedema. (NSG reported pt coughed w/ single ice chip trial w/ her earlier.)   This session focused on oral care and education of such to address oral stimulation for lingual movements and swallowing as well as provide oral hygiene. ~2 days ago, pt previously presented poor lingual movement d/t edema and elevation to the palate. She was unable to stick out her tongue. Noted mild throat clearing w/ (suspected) saliva. She was unable to talk -- little to no lingual movements appreciated. This is much improved today w/ min increased lingual movements; static and during speech(speech ~75-90% intelligible. Practiced protrusion and anterior movements during oral care; pt able to achieve lingual protrusion just past lips. Noted lingual edema and bruising w/ sloughing of tissue which was cleared w/ careful oral care using swabs. No increased facial/buccal swelling but min upper lip edema remains.  Oral care practiced w/ pt using several swabs addressing both the buccal and gum areas and the tongue to provide lingual stimulation/movements as  well as oral hygiene along w/ the saliva. Pt stated she is having increased saliva -- gave washclothes to wipe mouth. Instructed pt to continue oral care frequently as tolerates during the day for stimulation/swallowing. Shared w/ the MD she appeared improved overall.   Recommend continue strict NPO status. Discussed and educated on oral care as often as pt can tolerate during the day in order to provide hygiene and stimulation of lingual/labial movements and swallowing. Discussed monitoring for s/s of aspiration during, and especially to squeeze oral swabs of any liquid. ST services will continue to follow pt's status for readiness for po trials next 1-2 days.        HPI HPI: Pt  is a 81 y.o. 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  According to the patient and family since yesterday, patient was admitted to [facility] hospital 2 days ago for pneumoperitoneum suspected to be related to peritoneal dialysis, discharged home and then return to the emergency department at [facility] yesterday for bilateral shoulder pain, mild abdominal pain which was evaluated with no emergent or is new abnormal findings; no pneumonia.  She was discharged home without any interventions and states the orofacial issues have worsened since yesterday.  Per chart review these issues were not noted or evaluated in the emergency department yesterday.  She did have a low-grade fever yesterday per family members but no fever today.  No known dental issues that they are aware of.  She has not been eating or drinking well because of the pain in her swollen mouth/facial area and difficulty swallowing.  Not trying any medications for symptoms thus far.  History of similar issues in the past.   CT of Neck: Right lower facial soft tissue swelling, possibly cellulitis. No  drainable  fluid collection identified.  2. Moderate hypopharyngeal swelling, new from 16/09/930 and of  uncertain etiology though possibly infectious or allergic.   CXR: no active disease.      SLP Plan  Continue with current plan of care      Recommendations for follow up therapy are one component of a multi-disciplinary discharge planning process, led by the attending physician.  Recommendations may be updated based on patient status, additional functional criteria and insurance authorization.    Recommendations  Diet recommendations: NPO (oral care) Medication Administration: Via alternative means                General recommendations:  (Dietician f/u) Oral Care Recommendations: Oral care QID;Patient independent with oral care;Staff/trained caregiver to provide oral care (support) Follow Up Recommendations: Skilled nursing-short term rehab (<3 hours/day) Assistance recommended at discharge: Intermittent Supervision/Assistance SLP Visit Diagnosis: Dysphagia, oropharyngeal phase (R13.12) (suspected angioedema) Plan: Continue with current plan of care             Orinda Kenner, [place], [place]; [place] [phone] (ascom) Jillian Pianka  10/20/2021, 3:57 PM

## 2021-10-20 NOTE — Progress Notes (Signed)
Report called to Shirlean Mylar, RN on 1C.  All questions addressed and POC continued.  All Belongings packed up and given to pt.

## 2021-10-20 NOTE — Progress Notes (Signed)
PROGRESS NOTE    Catherine Burke  JJH:[id number] DOB: Mar 04, 1941 DOA: 10/15/2021 PCP: Idelle Crouch, MD  [id number]/[id number]-AA   Assessment & Plan:   Principal Problem:   Angioedema, initial encounter Active Problems:   Hyperlipidemia   Essential hypertension   GERD (gastroesophageal reflux disease)   Chronic kidney disease (CKD), stage IV (severe) (HCC)   ESRD (end stage renal disease) ([place])   Facial cellulitis   Malnutrition of moderate degree   Angioedema   81 year old with medical history of end-stage renal disease on peritoneal dialysis, hypertension, insulin-dependent diabetes mellitus, who presented emergency department for chief concerns of right facial swelling.   Facial swelling thought to be due to angioedema secondary to ACE inhibitor use.  ACE inhibitor's been discontinued and added to allergy list.   2/14:  Patient had worsening of her facial and tongue swelling.  Pt transferred to [facility] and PCCM consulted on 2/14 for close monitoring.   * Angioedema --s/p -Solu-Medrol 125 mg x 1, Benadryl 25 mg x 1, Pepcid 20 mg IV x1 -EpiPen x1 Plan: --cont IV decadron 6 mg q6h, per PCCM --no need for further IV Benadryl --still need to be NPO for now --NS@50    Essential hypertension Poor control over interval --home ARB d/c'ed --cont clonidine patch 0.2 mg --change IV hydralazine to IV labetalol 10 mg q8h   ESRD on peritoneal dialysis (end stage renal disease) ([place]) --iPD per nephrology   Facial cellulitis Assessment & Plan Facial cellulitis felt unlikely - DC'ed antibiotics  Anemia of CKD --Mircera as outpatient  Afib --noted early morning 2/15, new dx, likely related to acute illness --started on heparin gtt  --currently rate controlled and back to NSR --d/c heparin gtt today  Hypokalemia --replete with IV potassium while NPO  DM2, well controlled Hyperglycemia exacerbated by steroid use --A1c 5.7 --increase glargine to 7u daily --SSI    DVT  prophylaxis: Lovenox SQ Code Status: Limited code  Family Communication:   Level of care: Telemetry Medical Dispo:   The patient is from: home Anticipated d/c is to: home Anticipated d/c date is: 1-2 days Patient currently is not medically ready to d/c due to: NPO due to angioedema    Subjective and Interval History:  Pt was a lot more alert this morning.  Tongue swelling improved, but still not safe to swallow yet, per SLP.  Transfer to floor today.   Objective: Vitals:   10/20/21 1500 10/20/21 1542 10/20/21 1725 10/20/21 1749  BP: (!) 148/65 (!) 154/71  (!) 169/77  Pulse: 100 100  100  Resp: 13 18 13 18   Temp:  98.1 F (36.7 C)  97.7 F (36.5 C)  TempSrc:    Axillary  SpO2: 97% 99%  98%  Weight:   60.3 kg   Height:        Intake/Output Summary (Last 24 hours) at 10/20/2021 1839 Last data filed at 10/20/2021 1808 Gross per 24 hour  Intake 313.54 ml  Output --  Net 313.54 ml   Filed Weights   10/19/21 1720 10/20/21 0814 10/20/21 1725  Weight: 54.6 kg 56.6 kg 60.3 kg    Examination:   Constitutional: NAD, AAOx3 HEENT: conjunctivae and lids normal, EOMI, tongue large with some superficial ulceration CV: No cyanosis.   RESP: normal respiratory effort, on RA SKIN: warm, dry Neuro: II - XII grossly intact.   Psych: Normal mood and affect.     Data Reviewed: I have personally reviewed following labs and imaging studies  CBC: Recent  Labs  Lab 10/15/21 2200 10/08/2021 1621 10/17/21 0503 10/20/21 0440  WBC 5.2 4.0 5.7 16.4*  NEUTROABS 4.6 3.4 5.4  --   HGB 8.2* 9.9* 8.4* 8.9*  HCT 26.4* 32.2* 26.7* 27.6*  MCV 89.8 90.4 88.7 84.9  PLT 246 310 266 147   Basic Metabolic Panel: Recent Labs  Lab 10/15/21 2200 10/18/2021 1759 10/24/2021 2158 10/17/21 0503 10/19/21 0458 10/20/21 0440  NA 133* 132*  --  132* 132* 132*  K 3.7 3.9  --  3.6 3.2* 3.3*  CL 98 97*  --  96* 96* 95*  CO2 24 23  --  23 25 26   GLUCOSE 180* 255*  --  327* 279* 236*  BUN 49* 59*  --   54* 51* 59*  CREATININE 6.62* 7.71*  --  6.80* 5.55* 5.30*  CALCIUM 7.3* 7.2*  --  7.2* 7.2* 7.8*  MG  --   --  1.7  --  1.6* 2.3  PHOS  --   --   --  5.2*  --   --    GFR: Estimated Creatinine Clearance: 7.1 mL/min (A) (by C-G formula based on SCr of 5.3 mg/dL (H)). Liver Function Tests: Recent Labs  Lab 10/15/21 2200 10/26/2021 1759  AST 24 26  ALT 10 10  ALKPHOS 65 59  BILITOT 0.7 0.5  PROT 6.0* 5.9*  ALBUMIN 1.9* 1.9*   No results for input(s): LIPASE, AMYLASE in the last 168 hours. No results for input(s): AMMONIA in the last 168 hours. Coagulation Profile: Recent Labs  Lab 10/15/21 2248 10/20/2021 1759  INR 1.2 1.3*   Cardiac Enzymes: No results for input(s): CKTOTAL, CKMB, CKMBINDEX, TROPONINI in the last 168 hours. BNP (last 3 results) No results for input(s): PROBNP in the last 8760 hours. HbA1C: No results for input(s): HGBA1C in the last 72 hours.  CBG: Recent Labs  Lab 10/19/21 1606 10/19/21 2147 10/20/21 0724 10/20/21 1107 10/20/21 1647  GLUCAP 109* 275* 238* 205* 145*   Lipid Profile: No results for input(s): CHOL, HDL, LDLCALC, TRIG, CHOLHDL, LDLDIRECT in the last 72 hours. Thyroid Function Tests: Recent Labs    10/19/21 0458  TSH 0.313*   Anemia Panel: No results for input(s): VITAMINB12, FOLATE, FERRITIN, TIBC, IRON, RETICCTPCT in the last 72 hours. Sepsis Labs: Recent Labs  Lab 10/15/21 2200 10/15/2021 0057 10/15/2021 1718 10/24/2021 2158  PROCALCITON  --   --   --  6.86  LATICACIDVEN 1.0 0.7 2.0* 2.2*    Recent Results (from the past 240 hour(s))  Body fluid culture w Gram Stain     Status: None   Collection Time: 10/13/21 12:41 PM   Specimen: Peritoneal Washings; Body Fluid  Result Value Ref Range Status   Specimen Description   Final    PERITONEAL Performed at [facility], [address], [place], [place] [address]    Special Requests   Final    PERITONEAL Performed at [facility], [address]  [place], [place] [address]    Gram Stain   Final    NO SQUAMOUS EPITHELIAL CELLS SEEN FEW WBC SEEN NO ORGANISMS SEEN    Culture   Final    NO GROWTH 3 DAYS Performed at [facility], [place] [address] [place], [place] [address]    Report Status 10/17/2021 FINAL  Final  Resp Panel by RT-PCR (Flu A&B, Covid) Nasopharyngeal Swab     Status: None   Collection Time: 10/13/21  3:23 PM   Specimen: Nasopharyngeal Swab;  Nasopharyngeal(NP) swabs in vial transport medium  Result Value Ref Range Status   SARS Coronavirus 2 by RT PCR NEGATIVE NEGATIVE Final    Comment: (NOTE) SARS-CoV-2 target nucleic acids are NOT DETECTED.  The SARS-CoV-2 RNA is generally detectable in upper respiratory specimens during the acute phase of infection. The lowest concentration of SARS-CoV-2 viral copies this assay can detect is 138 copies/mL. A negative result does not preclude SARS-Cov-2 infection and should not be used as the sole basis for treatment or other patient management decisions. A negative result may occur with  improper specimen collection/handling, submission of specimen other than nasopharyngeal swab, presence of viral mutation(s) within the areas targeted by this assay, and inadequate number of viral copies(<138 copies/mL). A negative result must be combined with clinical observations, patient history, and epidemiological information. The expected result is Negative.  Fact Sheet for Patients:  EntrepreneurPulse.com.au  Fact Sheet for Healthcare Providers:  IncredibleEmployment.be  This test is no t yet approved or cleared by the [place] FDA and  has been authorized for detection and/or diagnosis of SARS-CoV-2 by FDA under an Emergency Use Authorization (EUA). This EUA will remain  in effect (meaning this test can be used) for the duration of the COVID-19 declaration under Section 564(b)(1) of the Act, 21 U.S.C.section 360bbb-3(b)(1), unless the  authorization is terminated  or revoked sooner.       Influenza A by PCR NEGATIVE NEGATIVE Final   Influenza B by PCR NEGATIVE NEGATIVE Final    Comment: (NOTE) The Xpert Xpress SARS-CoV-2/FLU/RSV plus assay is intended as an aid in the diagnosis of influenza from Nasopharyngeal swab specimens and should not be used as a sole basis for treatment. Nasal washings and aspirates are unacceptable for Xpert Xpress SARS-CoV-2/FLU/RSV testing.  Fact Sheet for Patients: EntrepreneurPulse.com.au  Fact Sheet for Healthcare Providers: IncredibleEmployment.be  This test is not yet approved or cleared by the [place] FDA and has been authorized for detection and/or diagnosis of SARS-CoV-2 by FDA under an Emergency Use Authorization (EUA). This EUA will remain in effect (meaning this test can be used) for the duration of the COVID-19 declaration under Section 564(b)(1) of the Act, 21 U.S.C. section 360bbb-3(b)(1), unless the authorization is terminated or revoked.  Performed at [facility], [address] [place], [place] [address]   Culture, blood (Routine x 2)     Status: None   Collection Time: 10/15/21 10:00 PM   Specimen: BLOOD LEFT WRIST  Result Value Ref Range Status   Specimen Description   Final    BLOOD LEFT WRIST BOTTLES DRAWN AEROBIC AND ANAEROBIC   Special Requests Blood Culture adequate volume  Final   Culture   Final    NO GROWTH 5 DAYS Performed at [facility], [address], [place], [place] [address]    Report Status 10/20/2021 FINAL  Final  Culture, blood (Routine x 2)     Status: None   Collection Time: 10/15/21 10:44 PM   Specimen: BLOOD RIGHT ARM  Result Value Ref Range Status   Specimen Description BLOOD RIGHT ARM  Final   Special Requests   Final    BOTTLES DRAWN AEROBIC AND ANAEROBIC Blood Culture adequate volume   Culture   Final    NO GROWTH 5 DAYS Performed at [facility], [address]  [place], [place] [address]    Report Status 10/20/2021 FINAL  Final  Resp Panel by RT-PCR (Flu A&B, Covid) Nasopharyngeal Swab     Status: None   Collection  Time: 10/29/2021  5:18 PM   Specimen: Nasopharyngeal Swab; Nasopharyngeal(NP) swabs in vial transport medium  Result Value Ref Range Status   SARS Coronavirus 2 by RT PCR NEGATIVE NEGATIVE Final    Comment: (NOTE) SARS-CoV-2 target nucleic acids are NOT DETECTED.  The SARS-CoV-2 RNA is generally detectable in upper respiratory specimens during the acute phase of infection. The lowest concentration of SARS-CoV-2 viral copies this assay can detect is 138 copies/mL. A negative result does not preclude SARS-Cov-2 infection and should not be used as the sole basis for treatment or other patient management decisions. A negative result may occur with  improper specimen collection/handling, submission of specimen other than nasopharyngeal swab, presence of viral mutation(s) within the areas targeted by this assay, and inadequate number of viral copies(<138 copies/mL). A negative result must be combined with clinical observations, patient history, and epidemiological information. The expected result is Negative.  Fact Sheet for Patients:  EntrepreneurPulse.com.au  Fact Sheet for Healthcare Providers:  IncredibleEmployment.be  This test is no t yet approved or cleared by the [place] FDA and  has been authorized for detection and/or diagnosis of SARS-CoV-2 by FDA under an Emergency Use Authorization (EUA). This EUA will remain  in effect (meaning this test can be used) for the duration of the COVID-19 declaration under Section 564(b)(1) of the Act, 21 U.S.C.section 360bbb-3(b)(1), unless the authorization is terminated  or revoked sooner.       Influenza A by PCR NEGATIVE NEGATIVE Final   Influenza B by PCR NEGATIVE NEGATIVE Final    Comment: (NOTE) The Xpert Xpress SARS-CoV-2/FLU/RSV plus assay is  intended as an aid in the diagnosis of influenza from Nasopharyngeal swab specimens and should not be used as a sole basis for treatment. Nasal washings and aspirates are unacceptable for Xpert Xpress SARS-CoV-2/FLU/RSV testing.  Fact Sheet for Patients: EntrepreneurPulse.com.au  Fact Sheet for Healthcare Providers: IncredibleEmployment.be  This test is not yet approved or cleared by the [place] FDA and has been authorized for detection and/or diagnosis of SARS-CoV-2 by FDA under an Emergency Use Authorization (EUA). This EUA will remain in effect (meaning this test can be used) for the duration of the COVID-19 declaration under Section 564(b)(1) of the Act, 21 U.S.C. section 360bbb-3(b)(1), unless the authorization is terminated or revoked.  Performed at [facility], [address] [place], [place] [address]   Body fluid culture w Gram Stain     Status: None   Collection Time: 10/31/2021 11:40 PM   Specimen: Peritoneal Washings; Body Fluid  Result Value Ref Range Status   Specimen Description   Final    PERITONEAL DIALYSIS Performed at [facility], [address] [place], [place] [address]    Special Requests   Final    NONE Performed at [facility], [address] [place], [place] [address]    Gram Stain   Final    NO SQUAMOUS EPITHELIAL CELLS SEEN NO WBC SEEN NO ORGANISMS SEEN    Culture   Final    NO GROWTH 3 DAYS Performed at [facility], [place] [address] [place], [place] [address]    Report Status 10/20/2021 FINAL  Final  MRSA Next Gen by PCR, Nasal     Status: None   Collection Time: 10/18/21  1:40 PM   Specimen: Nasal Mucosa; Nasal Swab  Result Value Ref Range Status   MRSA by PCR Next Gen NOT DETECTED NOT DETECTED Final    Comment: (NOTE) The GeneXpert MRSA Assay (  FDA approved for NASAL specimens only), is one component of a comprehensive MRSA colonization  surveillance program. It is not intended to diagnose MRSA infection nor to guide or monitor treatment for MRSA infections. Test performance is not FDA approved in patients less than 2 years old. Performed at [facility], [address] [place], [place] [address]       Radiology Studies: DG CHEST PORT 1 VIEW  Result Date: 10/19/2021 CLINICAL DATA:  New onset atrial fibrillation EXAM: PORTABLE CHEST 1 VIEW COMPARISON:  August 17, 2022 FINDINGS: The heart size and mediastinal contours are stable. Aortic atherosclerosis. Pulmonary vascular congestion. No overt pulmonary edema or focal airspace consolidation. No visible pleural effusion or pneumothorax. The visualized skeletal structures are unchanged. IMPRESSION: Pulmonary vascular congestion without overt pulmonary edema or focal airspace consolidation. Electronically Signed   By: Dahlia Bailiff M.D.   On: 10/19/2021 08:06     Scheduled Meds:  Chlorhexidine Gluconate Cloth  6 each Topical Daily   cloNIDine  0.2 mg Transdermal Weekly   darbepoetin (ARANESP) injection - NON-DIALYSIS  100 mcg Subcutaneous Q Thu-1800   dexamethasone (DECADRON) injection  6 mg Intravenous Q6H   gentamicin cream  1 application Topical Daily   hydrALAZINE  20 mg Intravenous Q8H   insulin aspart  0-5 Units Subcutaneous QHS   insulin aspart  0-6 Units Subcutaneous TID WC   insulin glargine-yfgn  5 Units Subcutaneous Daily   pantoprazole (PROTONIX) IV  40 mg Intravenous Q24H   Continuous Infusions:  sodium chloride 50 mL/hr at 10/20/21 1532   dialysis solution 1.5% low-MG/low-CA       LOS: 2 days     Enzo Bi, MD [facility] If 7PM-7AM, please contact night-coverage 10/20/2021, 6:39 PM

## 2021-10-20 NOTE — Progress Notes (Signed)
Pt transferred to 1C via bed and tele... All belongings with patient at time of transfer.

## 2021-10-20 NOTE — Progress Notes (Signed)
[facility]  ROUNDING NOTE   Subjective:   [name] was admitted to [facility] on 10/08/2021 for Facial swelling [R22.0] Angioedema, initial encounter [T78.3XXA] Fever, unspecified fever cause [R50.9] Angioedema [T78.3XXA]  Patient is known to our clinic and receives PD management at [facility], supervised by Dr Juleen China.   Doing well overall.  Alert today and able to communicate although some words are hard to understand because of tongue swelling.  PD had to be discontinued last night because of machine alarms.  Per PD nurse report, lines were kinked.   Objective:  Vital signs in last 24 hours:  Temp:  [97.7 F (36.5 C)-98.2 F (36.8 C)] 98.1 F (36.7 C) (02/16 0817) Pulse Rate:  [86-107] 101 (02/16 0817) Resp:  [11-18] 15 (02/16 0817) BP: (113-171)/(64-141) 166/95 (02/16 0817) SpO2:  [92 %-99 %] 95 % (02/16 0817) Weight:  [54.6 kg-56.6 kg] 56.6 kg (02/16 0814)  Weight change: 0.7 kg Filed Weights   10/19/21 0500 10/19/21 1720 10/20/21 0814  Weight: 55.4 kg 54.6 kg 56.6 kg    Intake/Output: I/O last 3 completed shifts: In: 10503.6 [I.V.:403.6; Other:10000; IV Piggyback:100] Out: 66294 [Other:10084]   Intake/Output this shift:  No intake/output data recorded.  Physical Exam: General: NAD,   Head: Facial swelling appears improved compared to yesterday, tongue is still swollen  Eyes: Anicteric  Lungs:  Clear to auscultation, normal effort  Heart: Regular rate and rhythm  Abdomen:  Soft, nontender  Extremities:  + Dependent peripheral edema.  Neurologic: Alert and able to have a simple conversation.  Skin: No lesions  Access: PD catheter    Basic Metabolic Panel: Recent Labs  Lab 10/13/21 1651 10/15/21 2200 10/26/2021 1759 10/27/2021 2158 10/17/21 0503 10/19/21 0458 10/20/21 0440  NA  --  133* 132*  --  132* 132* 132*  K  --  3.7 3.9  --  3.6 3.2* 3.3*  CL  --  98 97*  --  96* 96* 95*  CO2  --  24 23  --  23 25 26   GLUCOSE  --  180*  255*  --  327* 279* 236*  BUN  --  49* 59*  --  54* 51* 59*  CREATININE  --  6.62* 7.71*  --  6.80* 5.55* 5.30*  CALCIUM  --  7.3* 7.2*  --  7.2* 7.2* 7.8*  MG 1.8  --   --  1.7  --  1.6* 2.3  PHOS  --   --   --   --  5.2*  --   --      Liver Function Tests: Recent Labs  Lab 10/15/21 2200 10/23/2021 1759  AST 24 26  ALT 10 10  ALKPHOS 65 59  BILITOT 0.7 0.5  PROT 6.0* 5.9*  ALBUMIN 1.9* 1.9*    No results for input(s): LIPASE, AMYLASE in the last 168 hours. No results for input(s): AMMONIA in the last 168 hours.  CBC: Recent Labs  Lab 10/13/21 1136 10/15/21 2200 10/13/2021 1621 10/17/21 0503 10/20/21 0440  WBC 6.7 5.2 4.0 5.7 16.4*  NEUTROABS  --  4.6 3.4 5.4  --   HGB 9.3* 8.2* 9.9* 8.4* 8.9*  HCT 29.6* 26.4* 32.2* 26.7* 27.6*  MCV 88.9 89.8 90.4 88.7 84.9  PLT 211 246 310 266 263     Cardiac Enzymes: No results for input(s): CKTOTAL, CKMB, CKMBINDEX, TROPONINI in the last 168 hours.  BNP: Invalid input(s): POCBNP  CBG: Recent Labs  Lab 10/19/21 0733 10/19/21 1107  10/19/21 1606 10/19/21 2147 10/20/21 0724  GLUCAP 257* 191* 109* 275* 238*     Microbiology: Results for orders placed or performed during the hospital encounter of 10/08/2021  Resp Panel by RT-PCR (Flu A&B, Covid) Nasopharyngeal Swab     Status: None   Collection Time: 10/24/2021  5:18 PM   Specimen: Nasopharyngeal Swab; Nasopharyngeal(NP) swabs in vial transport medium  Result Value Ref Range Status   SARS Coronavirus 2 by RT PCR NEGATIVE NEGATIVE Final    Comment: (NOTE) SARS-CoV-2 target nucleic acids are NOT DETECTED.  The SARS-CoV-2 RNA is generally detectable in upper respiratory specimens during the acute phase of infection. The lowest concentration of SARS-CoV-2 viral copies this assay can detect is 138 copies/mL. A negative result does not preclude SARS-Cov-2 infection and should not be used as the sole basis for treatment or other patient management decisions. A negative result  may occur with  improper specimen collection/handling, submission of specimen other than nasopharyngeal swab, presence of viral mutation(s) within the areas targeted by this assay, and inadequate number of viral copies(<138 copies/mL). A negative result must be combined with clinical observations, patient history, and epidemiological information. The expected result is Negative.  Fact Sheet for Patients:  EntrepreneurPulse.com.au  Fact Sheet for Healthcare Providers:  IncredibleEmployment.be  This test is no t yet approved or cleared by the [place] FDA and  has been authorized for detection and/or diagnosis of SARS-CoV-2 by FDA under an Emergency Use Authorization (EUA). This EUA will remain  in effect (meaning this test can be used) for the duration of the COVID-19 declaration under Section 564(b)(1) of the Act, 21 U.S.C.section 360bbb-3(b)(1), unless the authorization is terminated  or revoked sooner.       Influenza A by PCR NEGATIVE NEGATIVE Final   Influenza B by PCR NEGATIVE NEGATIVE Final    Comment: (NOTE) The Xpert Xpress SARS-CoV-2/FLU/RSV plus assay is intended as an aid in the diagnosis of influenza from Nasopharyngeal swab specimens and should not be used as a sole basis for treatment. Nasal washings and aspirates are unacceptable for Xpert Xpress SARS-CoV-2/FLU/RSV testing.  Fact Sheet for Patients: EntrepreneurPulse.com.au  Fact Sheet for Healthcare Providers: IncredibleEmployment.be  This test is not yet approved or cleared by the [place] FDA and has been authorized for detection and/or diagnosis of SARS-CoV-2 by FDA under an Emergency Use Authorization (EUA). This EUA will remain in effect (meaning this test can be used) for the duration of the COVID-19 declaration under Section 564(b)(1) of the Act, 21 U.S.C. section 360bbb-3(b)(1), unless the authorization is terminated  or revoked.  Performed at [facility], [address] [place], [place] [address]   Body fluid culture w Gram Stain     Status: None (Preliminary result)   Collection Time: 10/05/2021 11:40 PM   Specimen: Peritoneal Washings; Body Fluid  Result Value Ref Range Status   Specimen Description   Final    PERITONEAL DIALYSIS Performed at [facility], [address], [place], [place] [address]    Special Requests   Final    NONE Performed at [facility], [address] [place], [place] [address]    Gram Stain   Final    NO SQUAMOUS EPITHELIAL CELLS SEEN NO WBC SEEN NO ORGANISMS SEEN    Culture   Final    NO GROWTH 2 DAYS Performed at [facility], [place] [address] [place], [place] [address]    Report Status PENDING  Incomplete  MRSA Next Gen  by PCR, Nasal     Status: None   Collection Time: 10/18/21  1:40 PM   Specimen: Nasal Mucosa; Nasal Swab  Result Value Ref Range Status   MRSA by PCR Next Gen NOT DETECTED NOT DETECTED Final    Comment: (NOTE) The GeneXpert MRSA Assay (FDA approved for NASAL specimens only), is one component of a comprehensive MRSA colonization surveillance program. It is not intended to diagnose MRSA infection nor to guide or monitor treatment for MRSA infections. Test performance is not FDA approved in patients less than 53 years old. Performed at [facility], [address] [place], [place] [address]     Coagulation Studies: No results for input(s): LABPROT, INR in the last 72 hours.   Urinalysis: No results for input(s): COLORURINE, LABSPEC, PHURINE, GLUCOSEU, HGBUR, BILIRUBINUR, KETONESUR, PROTEINUR, UROBILINOGEN, NITRITE, LEUKOCYTESUR in the last 72 hours.  Invalid input(s): APPERANCEUR    Imaging: DG CHEST PORT 1 VIEW  Result Date: 10/19/2021 CLINICAL DATA:  New onset atrial fibrillation EXAM: PORTABLE CHEST 1 VIEW COMPARISON:  August 17, 2022 FINDINGS: The heart size and mediastinal  contours are stable. Aortic atherosclerosis. Pulmonary vascular congestion. No overt pulmonary edema or focal airspace consolidation. No visible pleural effusion or pneumothorax. The visualized skeletal structures are unchanged. IMPRESSION: Pulmonary vascular congestion without overt pulmonary edema or focal airspace consolidation. Electronically Signed   By: Dahlia Bailiff M.D.   On: 10/19/2021 08:06     Medications:    dialysis solution 1.5% low-MG/low-CA     heparin 1,100 Units/hr (10/20/21 0803)    Chlorhexidine Gluconate Cloth  6 each Topical Daily   cloNIDine  0.2 mg Transdermal Weekly   dexamethasone (DECADRON) injection  6 mg Intravenous Q6H   gentamicin cream  1 application Topical Daily   hydrALAZINE  20 mg Intravenous Q8H   insulin aspart  0-5 Units Subcutaneous QHS   insulin aspart  0-6 Units Subcutaneous TID WC   insulin glargine-yfgn  5 Units Subcutaneous Daily   pantoprazole (PROTONIX) IV  40 mg Intravenous Q24H   acetaminophen **OR** acetaminophen, dianeal solution for CAPD/CCPD with heparin, hydrALAZINE, ondansetron **OR** ondansetron (ZOFRAN) IV  Assessment/ Plan:  [name] is a 81 y.o. white female with end stage renal disease on peritoneal dialysis, diabetes mellitus type II insulin dependent, hypertension, and hyperlipidemia who is admitted to [facility] on 10/19/2021 for Facial swelling [R22.0] Angioedema, initial encounter [T78.3XXA] Fever, unspecified fever cause [R50.9] Angioedema [T78.3XXA]  [facility] 59kg CCPD 5 exchanges 2 liter fills 9 hours  End Stage Renal Disease:  Small amount of fibrin noted in the effluent.  Managed with heparin.     Continue PD nightly  Hypertension:   ACE inhibitor and ARB on hold  Anemia of chronic kidney disease:  Mircera as outpatient given 09/27/2021. Will monitor.    Lab Results  Component Value Date   HGB 8.9 (L) 10/20/2021   Start aranesp weekly (Thursday)  4.  Angioedema,  believed due to prescribed ACE-i, currently held.   Currently being monitored in the ICU 5.  Hypokalemia,  Likely nutritional Expected to correct when she is able to eat normal diet. Will give one dose of Kcl 20 meq iv x 1 today   LOS: 2 Karys Meckley 2/16/20239:37 AM

## 2021-10-20 NOTE — Progress Notes (Signed)
Inpatient Diabetes Program Recommendations  AACE/ADA: New Consensus Statement on Inpatient Glycemic Control (2015)  Target Ranges:  Prepandial:   less than 140 mg/dL      Peak postprandial:   less than 180 mg/dL (1-2 hours)      Critically ill patients:  140 - 180 mg/dL   Lab Results  Component Value Date   GLUCAP 205 (H) 10/20/2021   HGBA1C 5.7 (H) 10/17/2021    Review of Glycemic Control  Latest Reference Range & Units 10/19/21 07:33 10/19/21 11:07 10/19/21 16:06 10/19/21 21:47 10/20/21 07:24 10/20/21 11:07  Glucose-Capillary 70 - 99 mg/dL 257 (H) 191 (H) 109 (H) 275 (H) 238 (H) 205 (H)  (H): Data is abnormally high Home DM Meds: Lantus 5 units QHS    Current Orders: Semglee 5 units Daily  Novolog 0-6 units TID ac/hs  Decadron 6 mg Q6 hours      Please consider:   1. Increase Semglee to 7 units Daily (if 5 unit dose already given this AM, please also give an extra 2 units X 1 dose this AM)   2. Increase the Strength and Frequency of the Novolog SSI to the 0-9 unit scale and increase frequency to Q4 hours while remains on Decadron  Thank you, Bethena Roys E. Jahnessa Vanduyn, RN, MSN, CDE  Diabetes Coordinator Inpatient Glycemic Control Team Team Pager [phone] (8am-5pm) 10/20/2021 11:39 AM

## 2021-10-20 NOTE — Progress Notes (Signed)
ANTICOAGULATION CONSULT NOTE  Pharmacy Consult for Heparin  Indication: atrial fibrillation  Allergies  Allergen Reactions   Ace Inhibitors Swelling   Levofloxacin Other (See Comments)    Throat Swelling    Penicillins Anaphylaxis   Other    Penicillin G Other (See Comments)   Sulfa Antibiotics     Patient Measurements: Height: 5\' 4"  (162.6 cm) Weight: 54.6 kg (120 lb 5.9 oz) IBW/kg (Calculated) : 54.7 Heparin Dosing Weight: 54.3 kg   Vital Signs: Temp: 98.1 F (36.7 C) (02/15 2000) Temp Source: Axillary (02/15 2000) BP: 151/64 (02/16 0000) Pulse Rate: 101 (02/16 0000)  Labs: Recent Labs    10/17/21 0503 10/19/21 0458 10/19/21 1531 10/20/21 0034  HGB 8.4*  --   --   --   HCT 26.7*  --   --   --   PLT 266  --   --   --   HEPARINUNFRC  --   --  0.10* 0.15*  CREATININE 6.80* 5.55*  --   --   TROPONINIHS  --  85*  --   --      Estimated Creatinine Clearance: 6.7 mL/min (A) (by C-G formula based on SCr of 5.55 mg/dL (H)).   Medical History: Past Medical History:  Diagnosis Date   Anemia    Arthritis    B12 deficiency 04/04/2021   Diabetes mellitus    GERD (gastroesophageal reflux disease)    HLD (hyperlipidemia)    Hypercholesterolemia    Hypertension    Kidney stones    Osteoporosis    Proteinuria     Medications:   Assessment: Pharmacy consulted to dose heparin in this 81 year old female with new onset Afib.  Pt was on heparin 5000 units SQ Q8H previously, last dose on 2/15 @ ~ 0500.    2/15 1531 HL 0.1   Goal of Therapy:  Heparin level 0.3-0.7 units/ml Monitor platelets by anticoagulation protocol: Yes   Plan:  2/16: HL @ 0034 = 0.15, SUBtherapeutic Will order heparin 1600 units IV X 1 and increase drip rate to 1100 units/hr.  Will recheck HL 8 hrs after rate change.   Ajeet Casasola D, PharmD 10/20/2021,1:27 AM

## 2021-10-20 NOTE — Progress Notes (Signed)
Peritoneal dialysis machine began to alarm at 2150. On-call dialysis RN called x2. Instructed to call manufacturer and troubleshoot. Troubleshooting attempted. Patient repositioned. PD machine continued to alarm "System Error 2240". [employer] manufacturer contacted at [employer]. [employer] representative, Mauricio, instructed to call on-call dialysis RN to restart PD. On-call RN called x2. No answer, voicemail left. Will notify nephrology.

## 2021-10-21 DIAGNOSIS — T783XXA Angioneurotic edema, initial encounter: Secondary | ICD-10-CM | POA: Diagnosis not present

## 2021-10-21 LAB — GLUCOSE, CAPILLARY
Glucose-Capillary: 263 mg/dL — ABNORMAL HIGH (ref 70–99)
Glucose-Capillary: 189 mg/dL — ABNORMAL HIGH (ref 70–99)
Glucose-Capillary: 136 mg/dL — ABNORMAL HIGH (ref 70–99)
Glucose-Capillary: 215 mg/dL — ABNORMAL HIGH (ref 70–99)

## 2021-10-21 LAB — BASIC METABOLIC PANEL
Anion gap: 9 (ref 5–15)
BUN: 61 mg/dL — ABNORMAL HIGH (ref 8–23)
CO2: 26 mmol/L (ref 22–32)
Calcium: 7.7 mg/dL — ABNORMAL LOW (ref 8.9–10.3)
Chloride: 98 mmol/L (ref 98–111)
Creatinine, Ser: 5.13 mg/dL — ABNORMAL HIGH (ref 0.44–1.00)
GFR, Estimated: 8 mL/min — ABNORMAL LOW (ref >60)
Glucose, Bld: 304 mg/dL — ABNORMAL HIGH (ref 70–99)
Potassium: 3.5 mmol/L (ref 3.5–5.1)
Sodium: 133 mmol/L — ABNORMAL LOW (ref 135–145)

## 2021-10-21 LAB — CBC
HCT: 25.2 % — ABNORMAL LOW (ref 36.0–46.0)
Hemoglobin: 8 g/dL — ABNORMAL LOW (ref 12.0–15.0)
MCH: 27.1 pg (ref 26.0–34.0)
MCHC: 31.7 g/dL (ref 30.0–36.0)
MCV: 85.4 fL (ref 80.0–100.0)
Platelets: 241 10*3/uL (ref 150–400)
RBC: 2.95 MIL/uL — ABNORMAL LOW (ref 3.87–5.11)
RDW: 16.8 % — ABNORMAL HIGH (ref 11.5–15.5)
WBC: 13.6 10*3/uL — ABNORMAL HIGH (ref 4.0–10.5)
nRBC: 0 % (ref 0.0–0.2)

## 2021-10-21 LAB — MAGNESIUM: Magnesium: 2.1 mg/dL (ref 1.7–2.4)

## 2021-10-21 MED ORDER — SODIUM CHLORIDE 0.9 % IV SOLN: INTRAVENOUS | Status: AC

## 2021-10-21 MED ORDER — HEPARIN SODIUM (PORCINE) 1000 UNIT/ML IJ SOLN
INTRAMUSCULAR | Status: AC
  Filled 2021-10-21: qty 10

## 2021-10-21 MED ORDER — BENZOCAINE 10 % MT GEL
Freq: Three times a day (TID) | OROMUCOSAL | Status: DC | PRN
  Filled 2021-10-21 (×2): qty 9

## 2021-10-21 NOTE — Progress Notes (Signed)
PD completed. PD catheter clear, dry intact. UF removed 365ml.

## 2021-10-21 NOTE — Progress Notes (Addendum)
[facility]  ROUNDING NOTE   Subjective:   Catherine Burke was admitted to [facility] on 10/21/2021 for Facial swelling [R22.0] Angioedema, initial encounter [T78.3XXA] Fever, unspecified fever cause [R50.9] Angioedema [T78.3XXA]  Patient is known to our clinic and receives PD management at [facility], supervised by Dr Juleen China.   Patient appears to be doing well today, sitting up in Remains NPO for swallowing concerns. Complains of mouth soreness. Reports respiratory status stable.  States she's a "mess" Reports pain in right arm, guarding  Objective:  Vital signs in last 24 hours:  Temp:  [97.4 F (36.3 C)-98.1 F (36.7 C)] 97.7 F (36.5 C) (02/17 0847) Pulse Rate:  [86-107] 86 (02/17 0847) Resp:  [12-18] 12 (02/17 0847) BP: (137-189)/(65-136) 137/90 (02/17 0847) SpO2:  [94 %-99 %] 98 % (02/17 0739) Weight:  [57.2 kg-60.3 kg] 57.2 kg (02/17 0847)  Weight change: 2 kg Filed Weights   10/20/21 1725 10/21/21 0521 10/21/21 0847  Weight: 60.3 kg 60 kg 57.2 kg    Intake/Output: I/O last 3 completed shifts: In: 313.5 [I.V.:313.5] Out: -    Intake/Output this shift:  Total I/O In: 10000 [Other:10000] Out: 31497 [Other:10385]  Physical Exam: General: NAD  Head: Facial swelling appears improved compared to yesterday, tongue is still swollen  Eyes: Anicteric  Lungs:  Clear to auscultation, normal effort  Heart: Regular rate and rhythm  Abdomen:  Soft, nontender  Extremities:  + Dependent peripheral edema.  Neurologic: Alert and able to have a simple conversation.  Skin: No lesions, generalized ecchymosis   Access: PD catheter    Basic Metabolic Panel: Recent Labs  Lab 10/19/2021 1759 10/22/2021 2158 10/17/21 0503 10/19/21 0458 10/20/21 0440 10/21/21 0424  NA 132*  --  132* 132* 132* 133*  K 3.9  --  3.6 3.2* 3.3* 3.5  CL 97*  --  96* 96* 95* 98  CO2 23  --  23 25 26 26   GLUCOSE 255*  --  327* 279* 236* 304*  BUN 59*  --  54* 51* 59* 61*   CREATININE 7.71*  --  6.80* 5.55* 5.30* 5.13*  CALCIUM 7.2*  --  7.2* 7.2* 7.8* 7.7*  MG  --  1.7  --  1.6* 2.3 2.1  PHOS  --   --  5.2*  --   --   --      Liver Function Tests: Recent Labs  Lab 10/15/21 2200 10/22/2021 1759  AST 24 26  ALT 10 10  ALKPHOS 65 59  BILITOT 0.7 0.5  PROT 6.0* 5.9*  ALBUMIN 1.9* 1.9*    No results for input(s): LIPASE, AMYLASE in the last 168 hours. No results for input(s): AMMONIA in the last 168 hours.  CBC: Recent Labs  Lab 10/15/21 2200 10/13/2021 1621 10/17/21 0503 10/20/21 0440 10/21/21 0424  WBC 5.2 4.0 5.7 16.4* 13.6*  NEUTROABS 4.6 3.4 5.4  --   --   HGB 8.2* 9.9* 8.4* 8.9* 8.0*  HCT 26.4* 32.2* 26.7* 27.6* 25.2*  MCV 89.8 90.4 88.7 84.9 85.4  PLT 246 310 266 263 241     Cardiac Enzymes: No results for input(s): CKTOTAL, CKMB, CKMBINDEX, TROPONINI in the last 168 hours.  BNP: Invalid input(s): POCBNP  CBG: Recent Labs  Lab 10/20/21 0724 10/20/21 1107 10/20/21 1647 10/20/21 2050 10/21/21 0740  GLUCAP 238* 205* 145* 256* 263*     Microbiology: Results for orders placed or performed during the hospital encounter of 10/05/2021  Resp Panel by RT-PCR (Flu A&B,  Covid) Nasopharyngeal Swab     Status: None   Collection Time: 10/24/2021  5:18 PM   Specimen: Nasopharyngeal Swab; Nasopharyngeal(NP) swabs in vial transport medium  Result Value Ref Range Status   SARS Coronavirus 2 by RT PCR NEGATIVE NEGATIVE Final    Comment: (NOTE) SARS-CoV-2 target nucleic acids are NOT DETECTED.  The SARS-CoV-2 RNA is generally detectable in upper respiratory specimens during the acute phase of infection. The lowest concentration of SARS-CoV-2 viral copies this assay can detect is 138 copies/mL. A negative result does not preclude SARS-Cov-2 infection and should not be used as the sole basis for treatment or other patient management decisions. A negative result may occur with  improper specimen collection/handling, submission of specimen  other than nasopharyngeal swab, presence of viral mutation(s) within the areas targeted by this assay, and inadequate number of viral copies(<138 copies/mL). A negative result must be combined with clinical observations, patient history, and epidemiological information. The expected result is Negative.  Fact Sheet for Patients:  EntrepreneurPulse.com.au  Fact Sheet for Healthcare Providers:  IncredibleEmployment.be  This test is no t yet approved or cleared by the [place] FDA and  has been authorized for detection and/or diagnosis of SARS-CoV-2 by FDA under an Emergency Use Authorization (EUA). This EUA will remain  in effect (meaning this test can be used) for the duration of the COVID-19 declaration under Section 564(b)(1) of the Act, 21 U.S.C.section 360bbb-3(b)(1), unless the authorization is terminated  or revoked sooner.       Influenza A by PCR NEGATIVE NEGATIVE Final   Influenza B by PCR NEGATIVE NEGATIVE Final    Comment: (NOTE) The Xpert Xpress SARS-CoV-2/FLU/RSV plus assay is intended as an aid in the diagnosis of influenza from Nasopharyngeal swab specimens and should not be used as a sole basis for treatment. Nasal washings and aspirates are unacceptable for Xpert Xpress SARS-CoV-2/FLU/RSV testing.  Fact Sheet for Patients: EntrepreneurPulse.com.au  Fact Sheet for Healthcare Providers: IncredibleEmployment.be  This test is not yet approved or cleared by the [place] FDA and has been authorized for detection and/or diagnosis of SARS-CoV-2 by FDA under an Emergency Use Authorization (EUA). This EUA will remain in effect (meaning this test can be used) for the duration of the COVID-19 declaration under Section 564(b)(1) of the Act, 21 U.S.C. section 360bbb-3(b)(1), unless the authorization is terminated or revoked.  Performed at [facility], [address]  [place], [place] [address]   Body fluid culture w Gram Stain     Status: None   Collection Time: 10/23/2021 11:40 PM   Specimen: Peritoneal Washings; Body Fluid  Result Value Ref Range Status   Specimen Description   Final    PERITONEAL DIALYSIS Performed at [facility], [address] [place], [place] [address]    Special Requests   Final    NONE Performed at [facility], [address] [place], [place] [address]    Gram Stain   Final    NO SQUAMOUS EPITHELIAL CELLS SEEN NO WBC SEEN NO ORGANISMS SEEN    Culture   Final    NO GROWTH 3 DAYS Performed at [facility], [place] [address] [place], [place] [address]    Report Status 10/20/2021 FINAL  Final  MRSA Next Gen by PCR, Nasal     Status: None   Collection Time: 10/18/21  1:40 PM   Specimen: Nasal Mucosa; Nasal Swab  Result Value Ref Range Status   MRSA by PCR Next Gen NOT  DETECTED NOT DETECTED Final    Comment: (NOTE) The GeneXpert MRSA Assay (FDA approved for NASAL specimens only), is one component of a comprehensive MRSA colonization surveillance program. It is not intended to diagnose MRSA infection nor to guide or monitor treatment for MRSA infections. Test performance is not FDA approved in patients less than 73 years old. Performed at [facility], [address] [place], [place] [address]     Coagulation Studies: No results for input(s): LABPROT, INR in the last 72 hours.   Urinalysis: No results for input(s): COLORURINE, LABSPEC, PHURINE, GLUCOSEU, HGBUR, BILIRUBINUR, KETONESUR, PROTEINUR, UROBILINOGEN, NITRITE, LEUKOCYTESUR in the last 72 hours.  Invalid input(s): APPERANCEUR    Imaging: No results found.   Medications:    sodium chloride 50 mL/hr at 10/21/21 2778   dialysis solution 1.5% low-MG/low-CA      Chlorhexidine Gluconate Cloth  6 each Topical Daily   cloNIDine  0.2 mg Transdermal Weekly   darbepoetin (ARANESP) injection - NON-DIALYSIS  100 mcg  Subcutaneous Q Thu-1800   dexamethasone (DECADRON) injection  6 mg Intravenous Q6H   gentamicin cream  1 application Topical Daily   heparin injection (subcutaneous)  5,000 Units Subcutaneous Q8H   insulin aspart  0-5 Units Subcutaneous QHS   insulin aspart  0-9 Units Subcutaneous TID WC   insulin glargine-yfgn  7 Units Subcutaneous Daily   labetalol  10 mg Intravenous Q8H   pantoprazole (PROTONIX) IV  40 mg Intravenous Q24H   acetaminophen **OR** acetaminophen, benzocaine, dianeal solution for CAPD/CCPD with heparin, hydrALAZINE, ondansetron **OR** ondansetron (ZOFRAN) IV  Assessment/ Plan:  Catherine Burke is a 81 y.o. white female with end stage renal disease on peritoneal dialysis, diabetes mellitus type II insulin dependent, hypertension, and hyperlipidemia who is admitted to [facility] on 10/19/2021 for Facial swelling [R22.0] Angioedema, initial encounter [T78.3XXA] Fever, unspecified fever cause [R50.9] Angioedema [T78.3XXA]  [facility] 59kg CCPD 5 exchanges 2 liter fills 9 hours  End Stage Renal Disease:  Will continue night PD treatment with instilled heparin.  Treatment overnight resulted in clear fluid, no fibrin seen. Monitoring fluid intake with IVF vs removal with PD treatments.   Hypertension:   ACE inhibitor and ARB on hold. BP stable for this patient  Anemia of chronic kidney disease:  Mircera as outpatient given 09/27/2021. Will monitor.    Lab Results  Component Value Date   HGB 8.0 (L) 10/21/2021  Aranesp ordered weekly (Thursday)  4.  Angioedema, believed due to prescribed ACE-i, currently held.   Transferred our of ICU on 10/20/21. Remains on dexamethasone and NPO. Ordered benzocaine for oral discomfort.   5.  Hypokalemia,  Likely nutritional Expected to correct when she is able to eat normal diet. Corrected to 3.5, supplemented yesterday   LOS: 3 Mitsue Peery 2/17/202312:02 PM

## 2021-10-21 NOTE — Progress Notes (Signed)
PROGRESS NOTE    SEE BEHARRY  JKK:[id number] DOB: [birth year] DOA: 10/06/2021 PCP: Idelle Crouch, MD  [id number]/[id number]-AA   Assessment & Plan:   Principal Problem:   Angioedema, initial encounter Active Problems:   Hyperlipidemia   Essential hypertension   GERD (gastroesophageal reflux disease)   Chronic kidney disease (CKD), stage IV (severe) (HCC)   ESRD (end stage renal disease) ([place])   Facial cellulitis   Malnutrition of moderate degree   Angioedema   81 year old with medical history of end-stage renal disease on peritoneal dialysis, hypertension, insulin-dependent diabetes mellitus, who presented emergency department for chief concerns of right facial swelling.   Facial swelling thought to be due to angioedema secondary to ACE inhibitor use.  ACE inhibitor's been discontinued and added to allergy list.   2/14:  Patient had worsening of her facial and tongue swelling.  Pt transferred to [facility] and PCCM consulted on 2/14 for close monitoring.   * Angioedema --s/p -Solu-Medrol 125 mg x 1, Benadryl 25 mg x 1, Pepcid 20 mg IV x1 -EpiPen x1 Plan: --cont IV decadron 6 mg q6h, per PCCM --no need for further IV Benadryl --NPO for now, not safe to swallow yet  Dysphagia --due to tongue swelling and poor lingual control of boluses and coordination  --Dobhoff attempted by RN today but unsuccessful.  Discussed with dietician, no indication to start TPN, will wait for IR to place Dobhoff tube on Monday and start tube feed then if pt still isn't safe to have oral intake. --MIVF@50  for now  Essential hypertension Poor control over interval --home ARB d/c'ed --cont clonidine patch 0.2 mg --cont IV labetalol 10 mg q8h   ESRD on peritoneal dialysis (end stage renal disease) ([place]) --iPD per nephrology   Facial cellulitis Assessment & Plan Facial cellulitis felt unlikely - DC'ed antibiotics  Anemia of CKD --Mircera as outpatient  Afib, isolated episode --noted early  morning 2/15, new dx, likely related to acute illness --started on heparin gtt, since d/c'ed --currently rate controlled and back to NSR  Hypokalemia --replete with IV potassium while NPO  DM2, well controlled Hyperglycemia exacerbated by steroid use --A1c 5.7 --cont glargine 7u daily --SSI    DVT prophylaxis: Lovenox SQ Code Status: Limited code  Family Communication:   Level of care: Telemetry Medical Dispo:   The patient is from: home Anticipated d/c is to: home Anticipated d/c date is: > 3 days Patient currently is not medically ready to d/c due to: NPO due to angioedema    Subjective and Interval History:  Pt reported tongue felt smaller.  Pt complained of pain and weakness in both arms, which had been present for 2 weeks.    SLP eval found pt still not safe to swallow.  Attempted to start tube feed, however, Dobhoff tube placement by RN wasn't successful, and IR already left for the day.     Objective: Vitals:   10/21/21 0739 10/21/21 0847 10/21/21 1304 10/21/21 1627  BP: (!) 155/136 137/90 (!) 163/72 (!) 154/83  Pulse: 93 86 88 84  Resp: 18 12 16 16   Temp: 97.9 F (36.6 C) 97.7 F (36.5 C) 98.1 F (36.7 C) 97.8 F (36.6 C)  TempSrc:  Axillary Oral   SpO2: 98%  99% 99%  Weight:  57.2 kg    Height:        Intake/Output Summary (Last 24 hours) at 10/21/2021 1830 Last data filed at 10/21/2021 0830 Gross per 24 hour  Intake 10000 ml  Output 10385  ml  Net -385 ml   Filed Weights   10/20/21 1725 10/21/21 0521 10/21/21 0847  Weight: 60.3 kg 60 kg 57.2 kg    Examination:   Constitutional: NAD, AAOx3 HEENT: conjunctivae and lids normal, EOMI, tongue dry with crusting and superficial ulceration and bruising  CV: No cyanosis.   RESP: normal respiratory effort, on RA Extremities: edema in BUE, R>L SKIN: warm, dry, hematoma over dorsal hands Neuro: II - XII grossly intact.  Arm strength and grip strength reduced on both sides   Data Reviewed: I have  personally reviewed following labs and imaging studies  CBC: Recent Labs  Lab 10/15/21 2200 10/23/2021 1621 10/17/21 0503 10/20/21 0440 10/21/21 0424  WBC 5.2 4.0 5.7 16.4* 13.6*  NEUTROABS 4.6 3.4 5.4  --   --   HGB 8.2* 9.9* 8.4* 8.9* 8.0*  HCT 26.4* 32.2* 26.7* 27.6* 25.2*  MCV 89.8 90.4 88.7 84.9 85.4  PLT 246 310 266 263 570   Basic Metabolic Panel: Recent Labs  Lab 10/06/2021 1759 10/07/2021 2158 10/17/21 0503 10/19/21 0458 10/20/21 0440 10/21/21 0424  NA 132*  --  132* 132* 132* 133*  K 3.9  --  3.6 3.2* 3.3* 3.5  CL 97*  --  96* 96* 95* 98  CO2 23  --  23 25 26 26   GLUCOSE 255*  --  327* 279* 236* 304*  BUN 59*  --  54* 51* 59* 61*  CREATININE 7.71*  --  6.80* 5.55* 5.30* 5.13*  CALCIUM 7.2*  --  7.2* 7.2* 7.8* 7.7*  MG  --  1.7  --  1.6* 2.3 2.1  PHOS  --   --  5.2*  --   --   --    GFR: Estimated Creatinine Clearance: 7.3 mL/min (A) (by C-G formula based on SCr of 5.13 mg/dL (H)). Liver Function Tests: Recent Labs  Lab 10/15/21 2200 11/01/2021 1759  AST 24 26  ALT 10 10  ALKPHOS 65 59  BILITOT 0.7 0.5  PROT 6.0* 5.9*  ALBUMIN 1.9* 1.9*   No results for input(s): LIPASE, AMYLASE in the last 168 hours. No results for input(s): AMMONIA in the last 168 hours. Coagulation Profile: Recent Labs  Lab 10/15/21 2248 10/12/2021 1759  INR 1.2 1.3*   Cardiac Enzymes: No results for input(s): CKTOTAL, CKMB, CKMBINDEX, TROPONINI in the last 168 hours. BNP (last 3 results) No results for input(s): PROBNP in the last 8760 hours. HbA1C: No results for input(s): HGBA1C in the last 72 hours.  CBG: Recent Labs  Lab 10/20/21 1647 10/20/21 2050 10/21/21 0740 10/21/21 1305 10/21/21 1629  GLUCAP 145* 256* 263* 189* 136*   Lipid Profile: No results for input(s): CHOL, HDL, LDLCALC, TRIG, CHOLHDL, LDLDIRECT in the last 72 hours. Thyroid Function Tests: Recent Labs    10/19/21 0458  TSH 0.313*   Anemia Panel: No results for input(s): VITAMINB12, FOLATE,  FERRITIN, TIBC, IRON, RETICCTPCT in the last 72 hours. Sepsis Labs: Recent Labs  Lab 10/15/21 2200 10/26/2021 0057 10/26/2021 1718 10/27/2021 2158  PROCALCITON  --   --   --  6.86  LATICACIDVEN 1.0 0.7 2.0* 2.2*    Recent Results (from the past 240 hour(s))  Body fluid culture w Gram Stain     Status: None   Collection Time: 10/13/21 12:41 PM   Specimen: Peritoneal Washings; Body Fluid  Result Value Ref Range Status   Specimen Description   Final    PERITONEAL Performed at [facility], [place],  [place] [address]    Special Requests   Final    PERITONEAL Performed at [facility], [place], [place] [address]    Gram Stain   Final    NO SQUAMOUS EPITHELIAL CELLS SEEN FEW WBC SEEN NO ORGANISMS SEEN    Culture   Final    NO GROWTH 3 DAYS Performed at [facility], [place] [address], [place], [place] [address]    Report Status 10/17/2021 FINAL  Final  Resp Panel by RT-PCR (Flu A&B, Covid) Nasopharyngeal Swab     Status: None   Collection Time: 10/13/21  3:23 PM   Specimen: Nasopharyngeal Swab; Nasopharyngeal(NP) swabs in vial transport medium  Result Value Ref Range Status   SARS Coronavirus 2 by RT PCR NEGATIVE NEGATIVE Final    Comment: (NOTE) SARS-CoV-2 target nucleic acids are NOT DETECTED.  The SARS-CoV-2 RNA is generally detectable in upper respiratory specimens during the acute phase of infection. The lowest concentration of SARS-CoV-2 viral copies this assay can detect is 138 copies/mL. A negative result does not preclude SARS-Cov-2 infection and should not be used as the sole basis for treatment or other patient management decisions. A negative result may occur with  improper specimen collection/handling, submission of specimen other than nasopharyngeal swab, presence of viral mutation(s) within the areas targeted by this assay, and inadequate number of viral copies(<138 copies/mL). A negative result must be  combined with clinical observations, patient history, and epidemiological information. The expected result is Negative.  Fact Sheet for Patients:  EntrepreneurPulse.com.au  Fact Sheet for Healthcare Providers:  IncredibleEmployment.be  This test is no t yet approved or cleared by the [place] FDA and  has been authorized for detection and/or diagnosis of SARS-CoV-2 by FDA under an Emergency Use Authorization (EUA). This EUA will remain  in effect (meaning this test can be used) for the duration of the COVID-19 declaration under Section 564(b)(1) of the Act, 21 U.S.C.section 360bbb-3(b)(1), unless the authorization is terminated  or revoked sooner.       Influenza A by PCR NEGATIVE NEGATIVE Final   Influenza B by PCR NEGATIVE NEGATIVE Final    Comment: (NOTE) The Xpert Xpress SARS-CoV-2/FLU/RSV plus assay is intended as an aid in the diagnosis of influenza from Nasopharyngeal swab specimens and should not be used as a sole basis for treatment. Nasal washings and aspirates are unacceptable for Xpert Xpress SARS-CoV-2/FLU/RSV testing.  Fact Sheet for Patients: EntrepreneurPulse.com.au  Fact Sheet for Healthcare Providers: IncredibleEmployment.be  This test is not yet approved or cleared by the [place] FDA and has been authorized for detection and/or diagnosis of SARS-CoV-2 by FDA under an Emergency Use Authorization (EUA). This EUA will remain in effect (meaning this test can be used) for the duration of the COVID-19 declaration under Section 564(b)(1) of the Act, 21 U.S.C. section 360bbb-3(b)(1), unless the authorization is terminated or revoked.  Performed at [facility], [address] [place], [place] [address]   Culture, blood (Routine x 2)     Status: None   Collection Time: 10/15/21 10:00 PM   Specimen: BLOOD LEFT WRIST  Result Value Ref Range Status   Specimen  Description   Final    BLOOD LEFT WRIST BOTTLES DRAWN AEROBIC AND ANAEROBIC   Special Requests Blood Culture adequate volume  Final   Culture   Final    NO GROWTH 5 DAYS Performed at [facility], [address], [place], [place] [address]    Report Status 10/20/2021 FINAL  Final  Culture, blood (Routine x 2)     Status: None   Collection Time: 10/15/21 10:44 PM   Specimen: BLOOD RIGHT ARM  Result Value Ref Range Status   Specimen Description BLOOD RIGHT ARM  Final   Special Requests   Final    BOTTLES DRAWN AEROBIC AND ANAEROBIC Blood Culture adequate volume   Culture   Final    NO GROWTH 5 DAYS Performed at [facility], [address], [place], [place] [address]    Report Status 10/20/2021 FINAL  Final  Resp Panel by RT-PCR (Flu A&B, Covid) Nasopharyngeal Swab     Status: None   Collection Time: 10/26/2021  5:18 PM   Specimen: Nasopharyngeal Swab; Nasopharyngeal(NP) swabs in vial transport medium  Result Value Ref Range Status   SARS Coronavirus 2 by RT PCR NEGATIVE NEGATIVE Final    Comment: (NOTE) SARS-CoV-2 target nucleic acids are NOT DETECTED.  The SARS-CoV-2 RNA is generally detectable in upper respiratory specimens during the acute phase of infection. The lowest concentration of SARS-CoV-2 viral copies this assay can detect is 138 copies/mL. A negative result does not preclude SARS-Cov-2 infection and should not be used as the sole basis for treatment or other patient management decisions. A negative result may occur with  improper specimen collection/handling, submission of specimen other than nasopharyngeal swab, presence of viral mutation(s) within the areas targeted by this assay, and inadequate number of viral copies(<138 copies/mL). A negative result must be combined with clinical observations, patient history, and epidemiological information. The expected result is Negative.  Fact Sheet for Patients:  EntrepreneurPulse.com.au  Fact Sheet for  Healthcare Providers:  IncredibleEmployment.be  This test is no t yet approved or cleared by the [place] FDA and  has been authorized for detection and/or diagnosis of SARS-CoV-2 by FDA under an Emergency Use Authorization (EUA). This EUA will remain  in effect (meaning this test can be used) for the duration of the COVID-19 declaration under Section 564(b)(1) of the Act, 21 U.S.C.section 360bbb-3(b)(1), unless the authorization is terminated  or revoked sooner.       Influenza A by PCR NEGATIVE NEGATIVE Final   Influenza B by PCR NEGATIVE NEGATIVE Final    Comment: (NOTE) The Xpert Xpress SARS-CoV-2/FLU/RSV plus assay is intended as an aid in the diagnosis of influenza from Nasopharyngeal swab specimens and should not be used as a sole basis for treatment. Nasal washings and aspirates are unacceptable for Xpert Xpress SARS-CoV-2/FLU/RSV testing.  Fact Sheet for Patients: EntrepreneurPulse.com.au  Fact Sheet for Healthcare Providers: IncredibleEmployment.be  This test is not yet approved or cleared by the [place] FDA and has been authorized for detection and/or diagnosis of SARS-CoV-2 by FDA under an Emergency Use Authorization (EUA). This EUA will remain in effect (meaning this test can be used) for the duration of the COVID-19 declaration under Section 564(b)(1) of the Act, 21 U.S.C. section 360bbb-3(b)(1), unless the authorization is terminated or revoked.  Performed at [facility], [address] [place], [place] [address]   Body fluid culture w Gram Stain     Status: None   Collection Time: 10/30/2021 11:40 PM   Specimen: Peritoneal Washings; Body Fluid  Result Value Ref Range Status   Specimen Description   Final    PERITONEAL DIALYSIS Performed at [facility], [address], [place], [place] [address]    Special Requests   Final    NONE Performed at [facility],  [address] [place], [place] [address]  Gram Stain   Final    NO SQUAMOUS EPITHELIAL CELLS SEEN NO WBC SEEN NO ORGANISMS SEEN    Culture   Final    NO GROWTH 3 DAYS Performed at [facility], [address] [place], [place] [address]    Report Status 10/20/2021 FINAL  Final  MRSA Next Gen by PCR, Nasal     Status: None   Collection Time: 10/18/21  1:40 PM   Specimen: Nasal Mucosa; Nasal Swab  Result Value Ref Range Status   MRSA by PCR Next Gen NOT DETECTED NOT DETECTED Final    Comment: (NOTE) The GeneXpert MRSA Assay (FDA approved for NASAL specimens only), is one component of a comprehensive MRSA colonization surveillance program. It is not intended to diagnose MRSA infection nor to guide or monitor treatment for MRSA infections. Test performance is not FDA approved in patients less than 36 years old. Performed at [facility], [address], [place], [place] [address]       Radiology Studies: No results found.   Scheduled Meds:  Chlorhexidine Gluconate Cloth  6 each Topical Daily   cloNIDine  0.2 mg Transdermal Weekly   darbepoetin (ARANESP) injection - NON-DIALYSIS  100 mcg Subcutaneous Q Thu-1800   dexamethasone (DECADRON) injection  6 mg Intravenous Q6H   gentamicin cream  1 application Topical Daily   heparin injection (subcutaneous)  5,000 Units Subcutaneous Q8H   insulin aspart  0-5 Units Subcutaneous QHS   insulin aspart  0-9 Units Subcutaneous TID WC   insulin glargine-yfgn  7 Units Subcutaneous Daily   labetalol  10 mg Intravenous Q8H   pantoprazole (PROTONIX) IV  40 mg Intravenous Q24H   Continuous Infusions:  sodium chloride 50 mL/hr at 10/21/21 5697   dialysis solution 1.5% low-MG/low-CA       LOS: 3 days     Enzo Bi, MD [facility] If 7PM-7AM, please contact night-coverage 10/21/2021, 6:30 PM

## 2021-10-21 NOTE — Progress Notes (Signed)
Speech Language Pathology Treatment: Dysphagia  Patient Details Name: Catherine Burke MRN: [id number] DOB: September 22, 1940 Today's Date: 10/21/2021 Time: 4196-2229 SLP Time Calculation (min) (ACUTE ONLY): 60 min  Assessment / Plan / Recommendation Clinical Impression  Pt seen today for ongoing assessment of readiness to initiate po's again, in hopes to establish an oral diet. Family was not present this session. Pt was resting in bed; positioning support w/ pillows given for more upright sitting. Pt appeared calmer w/ less discomfort w/ her breathing today vs previous sessions. Speech slightly more intelligible; increased lingual movements noted. Pt stated she felt "tired" d/t not sleeping last night.   Pt was moved from the CCU to a regular room today; recent CCU admit d/t concerns of increasing oropharyngeal angioedema and need to monitor her airway. She has not required airway support but continues to receive medications to address the angioedema. Pt remains NPO d/t HIGH risk for aspiration from oropharyngeal phase dysphagia in light of lingual/oral angioedema.    This session focused on oral care, trials of single ice chips and other po's, and education of such to address oral stimulation for lingual movements and swallowing as well as provide oral hygiene.   Noted pt's improved lingual movements; static movements and during speech(speech ~75-90% intelligible. Practiced protrusion and anterior movements during oral care; pt able to achieve lingual protrusion past lips, elevation and lateral movements. Noted lingual edema and bruising w/ sloughing of tissue which was cleared w/ careful oral care using swabs. No increased facial/buccal swelling but min upper lip edema remains.  Oral care practiced w/ pt using several swabs addressing both the buccal and gum areas and the tongue to provide lingual stimulation/movements as well as oral hygiene along w/ the saliva. Pt stated she is having increased saliva --  gave wash clothes to wipe mouth. Instructed pt to continue oral care frequently as tolerates during the day for stimulation/swallowing.   Trials of single ice chips (10) were tolerated w/ fair lingual movements for bolus control and A-P transfer movements for swallowing. No overt clinical s/s of aspiration were noted during trials; vocal quality fairly clear and no decline in O2 sats. Increased oral phase time noted d/t slow, deliberate lingual/oral movements. Pt often "slurped" in order to maintain control of water/ice chip bolus. Min effortful swallows were noted.  1 tsp each of thin liquid and puree were given to assess tolerance during this session. Pt exhibited much decreased lingual/oral control w/ increased oral phase effort to transfer, swallow, and orally clear bolus, especially w/ the puree trial. Apparent loss of thin liquid bolus was noted w/ overt clinical s/s of aspiration following (cough, throat clear, multiple swallows). Pt appears at increased risk for aspiration w/ these consistencies -- nothing further attempted.   Recommend continue NPO status for diet but w/ introduction of Single ice chip trials to engage in therapeutic lingual, oral, and pharyngeal swallowing exercise, as well as to offer oral hygiene. Discussed and educated on oral care as often as pt can tolerate during the day in order to provide hygiene and stimulation of lingual/labial movements and swallowing, as well as PRIOR to any ice chips. Pt seemed eager for the ice chips to be added to her day. Discussed monitoring for s/s of aspiration w/ ice chip trials and to stop/rest if noted; also to squeeze oral swabs of any liquid prior to use. ST services will continue to follow pt's status for readiness for po trials next 1-2 days. NSG and MD updated on pt's  progress; Dietician updated. Recommend support w/ NG TFs in light of pt's NPO status for 5+ days; not ready to initiate/consume and oral diet at this time. MD agreed.        HPI HPI: Pt  is a 81 y.o. 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  According to the patient and family since yesterday, patient was admitted to [facility] hospital 2 days ago for pneumoperitoneum suspected to be related to peritoneal dialysis, discharged home and then return to the emergency department at [facility] yesterday for bilateral shoulder pain, mild abdominal pain which was evaluated with no emergent or is new abnormal findings; no pneumonia.  She was discharged home without any interventions and states the orofacial issues have worsened since yesterday.  Per chart review these issues were not noted or evaluated in the emergency department yesterday.  She did have a low-grade fever yesterday per family members but no fever today.  No known dental issues that they are aware of.  She has not been eating or drinking well because of the pain in her swollen mouth/facial area and difficulty swallowing.  Not trying any medications for symptoms thus far.  History of similar issues in the past.   CT of Neck: Right lower facial soft tissue swelling, possibly cellulitis. No  drainable fluid collection identified.  2. Moderate hypopharyngeal swelling, new from 71/24/5809 and of  uncertain etiology though possibly infectious or allergic.   CXR: no active disease.      SLP Plan  Continue with current plan of care      Recommendations for follow up therapy are one component of a multi-disciplinary discharge planning process, led by the attending physician.  Recommendations may be updated based on patient status, additional functional criteria and insurance authorization.    Recommendations  Diet recommendations:  (ice chip trials to engage oropharyngeal swallowing) Medication Administration: Via alternative means Supervision: Full  supervision/cueing for compensatory strategies Compensations: Minimize environmental distractions;Slow rate (single ice chips) Postural Changes and/or Swallow Maneuvers: Seated upright 90 degrees;Upright 30-60 min after meal                General recommendations:  (Dietician f/u) Oral Care Recommendations: Oral care QID;Oral care prior to ice chip/H20;Patient independent with oral care;Staff/trained caregiver to provide oral care (support) Follow Up Recommendations: Skilled nursing-short term rehab (<3 hours/day) Assistance recommended at discharge: Frequent or constant Supervision/Assistance SLP Visit Diagnosis: Dysphagia, oropharyngeal phase (R13.12) (suspected angioedema) Plan: Continue with current plan of care            Orinda Kenner, [place], [place]; [place] [phone] (ascom) Maggi Hershkowitz  10/21/2021, 6:56 PM

## 2021-10-21 NOTE — Progress Notes (Signed)
PT Cancellation Note  Patient Details Name: ANIAYAH ALANIZ MRN: [id number] DOB: [birth year]   Cancelled Treatment:    Reason Eval/Treat Not Completed: Fatigue/lethargy limiting ability to participate: Pt stated that she was "exhausted" and "didn't sleep at all last night". Pt requested no PT services this date and requested return tomorrow for PT evaluation.  Will attempt to see pt tomorrow as medically appropriate.   Linus Salmons PT, DPT 10/21/21, 3:44 PM

## 2021-10-21 NOTE — Progress Notes (Signed)
PD initiated, catheter site clean, dry, intact, no infection.  Cycler went into first fill without issue.

## 2021-10-21 NOTE — Progress Notes (Signed)
Nutrition Follow-up  DOCUMENTATION CODES:   Non-severe (moderate) malnutrition in context of chronic illness  INTERVENTION:   -Plan for NGT placement on Monday:   Initiate Osmolite 1.2 @ 25 ml/hr and increase by 10 ml every 8 hours to goal rate of 55 ml/hr.   80 ml free water flush every 6 hours  Tube feeding regimen provides 1584 kcal, 73 grams of protein, and 1082 ml of H2O. Total free water: 1402 ml daily  NUTRITION DIAGNOSIS:   Moderate Malnutrition related to chronic illness (ESRD on PD) as evidenced by mild fat depletion, mild muscle depletion.  Ongoing  GOAL:   Patient will meet greater than or equal to 90% of their needs  Unmet  MONITOR:   Diet advancement, Labs, Weight trends, Skin, I & O's  REASON FOR ASSESSMENT:   Malnutrition Screening Tool    ASSESSMENT:   81 y/o female with h/o ESRD on PD, DM, HTN and HLD who is admitted with lip swelling and angioedema.  Reviewed I/O's: +314 ml x 24 hours and -7.4 L since admission   Case discussed with RN and MD. Pt is still not ready for oral intake and has not received adequate nutrition this admission. RN attempted NGT placement, but unsuccessful. Plan for IR to place tube on Monday.   Medications reviewed and include aranesp, decadron, and 0.9% sodium chloride infusion @ 50 ml/hr.   Labs reviewed: Na: 133, CBGS: 136-263 (inpatient orders for glycemic control are 0-5 units insulin aspart daily at bedtime, 0-9 units insulin aspart TID with meals, and 7 units insulin glargine-yfgn daily at bedtime).    Diet Order:   Diet Order             Diet NPO time specified  Diet effective now                   EDUCATION NEEDS:   Education needs have been addressed  Skin:  Skin Assessment: Skin Integrity Issues: Skin Integrity Issues:: DTI DTI: rt and lt sacrum  Last BM:  10/18/21  Height:   Ht Readings from Last 1 Encounters:  10/18/21 5\' 4"  (1.626 m)    Weight:   Wt Readings from Last 1 Encounters:   10/21/21 57.2 kg    Ideal Body Weight:  54.5 kg  BMI:  Body mass index is 21.65 kg/m.  Estimated Nutritional Needs:   Kcal:  1400-1600kcal/day  Protein:  70-80g/day  Fluid:  UOP +1L    Loistine Chance, RD, LDN, [place] Registered Dietitian II Certified Diabetes Care and Education Specialist Please refer to AMION for RD and/or RD on-call/weekend/after hours pager

## 2021-10-21 NOTE — Care Management Important Message (Signed)
Important Message  Patient Details  Name: Catherine Burke MRN: [id number] Date of Birth: 1941-05-17   Medicare Important Message Given:  Yes     Dannette Barbara 10/21/2021, 11:16 AM

## 2021-10-21 NOTE — TOC Progression Note (Signed)
Transition of Care [facility]) - Progression Note    Patient Details  Name: BHAVYA ESCHETE MRN: [id number] Date of Birth: [birth year]  Transition of Care [facility]) CM/SW [place], LCSW Phone Number: 10/21/2021, 12:39 PM  Clinical Narrative:   Asked MD about ordering PT eval. CSW to follow up after PT eval based on their recs.    Expected Discharge Plan: [place] Barriers to Discharge: Continued Medical Work up  Expected Discharge Plan and Services Expected Discharge Plan: [place]   Discharge Planning Services: CM Consult   Living arrangements for the past 2 months: Single Family Home                 DME Arranged: N/A DME Agency: NA                   Social Determinants of Health (SDOH) Interventions    Readmission Risk Interventions Readmission Risk Prevention Plan 10/19/2021  Transportation Screening Complete  Medication Review Press photographer) Complete  PCP or Specialist appointment within 3-5 days of discharge Complete  HRI or Home Care Consult Complete  SW Recovery Care/Counseling Consult Complete  Palliative Care Screening Not [place] Complete  Some recent data might be hidden

## 2021-10-22 ENCOUNTER — Inpatient Hospital Stay: Payer: Medicare PPO

## 2021-10-22 DIAGNOSIS — T783XXA Angioneurotic edema, initial encounter: Secondary | ICD-10-CM | POA: Diagnosis not present

## 2021-10-22 LAB — BASIC METABOLIC PANEL
Anion gap: 12 (ref 5–15)
BUN: 64 mg/dL — ABNORMAL HIGH (ref 8–23)
CO2: 25 mmol/L (ref 22–32)
Calcium: 7.7 mg/dL — ABNORMAL LOW (ref 8.9–10.3)
Chloride: 97 mmol/L — ABNORMAL LOW (ref 98–111)
Creatinine, Ser: 4.91 mg/dL — ABNORMAL HIGH (ref 0.44–1.00)
GFR, Estimated: 8 mL/min — ABNORMAL LOW (ref >60)
Glucose, Bld: 351 mg/dL — ABNORMAL HIGH (ref 70–99)
Potassium: 3.4 mmol/L — ABNORMAL LOW (ref 3.5–5.1)
Sodium: 134 mmol/L — ABNORMAL LOW (ref 135–145)

## 2021-10-22 LAB — GLUCOSE, CAPILLARY
Glucose-Capillary: 264 mg/dL — ABNORMAL HIGH (ref 70–99)
Glucose-Capillary: 299 mg/dL — ABNORMAL HIGH (ref 70–99)
Glucose-Capillary: 290 mg/dL — ABNORMAL HIGH (ref 70–99)
Glucose-Capillary: 268 mg/dL — ABNORMAL HIGH (ref 70–99)

## 2021-10-22 LAB — CBC
HCT: 24.9 % — ABNORMAL LOW (ref 36.0–46.0)
Hemoglobin: 7.8 g/dL — ABNORMAL LOW (ref 12.0–15.0)
MCH: 27.3 pg (ref 26.0–34.0)
MCHC: 31.3 g/dL (ref 30.0–36.0)
MCV: 87.1 fL (ref 80.0–100.0)
Platelets: 231 10*3/uL (ref 150–400)
RBC: 2.86 MIL/uL — ABNORMAL LOW (ref 3.87–5.11)
RDW: 16.6 % — ABNORMAL HIGH (ref 11.5–15.5)
WBC: 13.2 10*3/uL — ABNORMAL HIGH (ref 4.0–10.5)
nRBC: 0 % (ref 0.0–0.2)

## 2021-10-22 LAB — MAGNESIUM: Magnesium: 2 mg/dL (ref 1.7–2.4)

## 2021-10-22 MED ORDER — SODIUM CHLORIDE 0.9 % IV SOLN: INTRAVENOUS | Status: DC

## 2021-10-22 MED ORDER — INSULIN GLARGINE-YFGN 100 UNIT/ML ~~LOC~~ SOLN
12.0000 [IU] | Freq: Every day | SUBCUTANEOUS | Status: DC
  Administered 2021-10-23 – 2021-10-26 (×4): 12 [IU] via SUBCUTANEOUS
  Filled 2021-10-22 (×5): qty 0.12

## 2021-10-22 MED ORDER — CHLORHEXIDINE GLUCONATE 0.12 % MT SOLN
15.0000 mL | Freq: Every day | OROMUCOSAL | Status: DC
  Administered 2021-10-22 – 2021-10-27 (×5): 15 mL via OROMUCOSAL
  Filled 2021-10-22 (×4): qty 15

## 2021-10-22 MED ORDER — NEPRO/CARBSTEADY PO LIQD
237.0000 mL | Freq: Three times a day (TID) | ORAL | Status: DC
  Administered 2021-10-22 – 2021-10-31 (×10): 237 mL via ORAL

## 2021-10-22 NOTE — Progress Notes (Signed)
[facility]  ROUNDING NOTE   Subjective:   [name] was admitted to [facility] on 10/10/2021 for Facial swelling [R22.0] Angioedema, initial encounter [T78.3XXA] Fever, unspecified fever cause [R50.9] Angioedema [T78.3XXA]  Patient is known to our clinic and receives PD management at [facility], supervised by Dr Juleen China.   Patient sitting comfortably in the chair. Complains of mouth soreness and requesting for PO intake. Speech therapy evaluation is pending.   Reports respiratory status stable.   Objective:  Vital signs in last 24 hours:  Temp:  [97.3 F (36.3 C)-98.1 F (36.7 C)] 97.5 F (36.4 C) (02/18 0912) Pulse Rate:  [78-88] 84 (02/18 0912) Resp:  [16-18] 16 (02/18 0912) BP: (110-176)/(65-83) 150/70 (02/18 0912) SpO2:  [98 %-99 %] 98 % (02/18 0912) Weight:  [57.4 kg-64.1 kg] 64.1 kg (02/18 0523)  Weight change: 0.6 kg Filed Weights   10/21/21 0847 10/21/21 1943 10/22/21 0523  Weight: 57.2 kg 57.4 kg 64.1 kg    Intake/Output: I/O last 3 completed shifts: In: 10000 [Other:10000] Out: 96283 [Other:10385]   Intake/Output this shift:  Total I/O In: 10000 [Other:10000] Out: 9745 [Other:9745]  Physical Exam: General: NAD  Head: Facial swelling appears improved compared to yesterday, tongue is still swollen  Eyes: Anicteric  Lungs:  Clear to auscultation, normal effort  Heart: Regular rate and rhythm  Abdomen:  Soft, nontender  Extremities:  + Dependent peripheral edema.  Neurologic: Alert and able to have a simple conversation.  Skin: No lesions, generalized ecchymosis   Access: PD catheter    Basic Metabolic Panel: Recent Labs  Lab 10/08/2021 2158 10/17/21 0503 10/19/21 0458 10/20/21 0440 10/21/21 0424 10/22/21 0503  NA  --  132* 132* 132* 133* 134*  K  --  3.6 3.2* 3.3* 3.5 3.4*  CL  --  96* 96* 95* 98 97*  CO2  --  23 25 26 26 25   GLUCOSE  --  327* 279* 236* 304* 351*  BUN  --  54* 51* 59* 61* 64*  CREATININE  --  6.80*  5.55* 5.30* 5.13* 4.91*  CALCIUM  --  7.2* 7.2* 7.8* 7.7* 7.7*  MG 1.7  --  1.6* 2.3 2.1 2.0  PHOS  --  5.2*  --   --   --   --      Liver Function Tests: Recent Labs  Lab 10/15/21 2200 10/13/2021 1759  AST 24 26  ALT 10 10  ALKPHOS 65 59  BILITOT 0.7 0.5  PROT 6.0* 5.9*  ALBUMIN 1.9* 1.9*    No results for input(s): LIPASE, AMYLASE in the last 168 hours. No results for input(s): AMMONIA in the last 168 hours.  CBC: Recent Labs  Lab 10/15/21 2200 10/17/2021 1621 10/17/21 0503 10/20/21 0440 10/21/21 0424 10/22/21 0503  WBC 5.2 4.0 5.7 16.4* 13.6* 13.2*  NEUTROABS 4.6 3.4 5.4  --   --   --   HGB 8.2* 9.9* 8.4* 8.9* 8.0* 7.8*  HCT 26.4* 32.2* 26.7* 27.6* 25.2* 24.9*  MCV 89.8 90.4 88.7 84.9 85.4 87.1  PLT 246 310 266 263 241 231     Cardiac Enzymes: No results for input(s): CKTOTAL, CKMB, CKMBINDEX, TROPONINI in the last 168 hours.  BNP: Invalid input(s): POCBNP  CBG: Recent Labs  Lab 10/21/21 0740 10/21/21 1305 10/21/21 1629 10/21/21 2152 10/22/21 1000  GLUCAP 263* 189* 136* 215* 264*     Microbiology: Results for orders placed or performed during the hospital encounter of 10/31/2021  Resp Panel by RT-PCR (Flu  A&B, Covid) Nasopharyngeal Swab     Status: None   Collection Time: 10/26/2021  5:18 PM   Specimen: Nasopharyngeal Swab; Nasopharyngeal(NP) swabs in vial transport medium  Result Value Ref Range Status   SARS Coronavirus 2 by RT PCR NEGATIVE NEGATIVE Final    Comment: (NOTE) SARS-CoV-2 target nucleic acids are NOT DETECTED.  The SARS-CoV-2 RNA is generally detectable in upper respiratory specimens during the acute phase of infection. The lowest concentration of SARS-CoV-2 viral copies this assay can detect is 138 copies/mL. A negative result does not preclude SARS-Cov-2 infection and should not be used as the sole basis for treatment or other patient management decisions. A negative result may occur with  improper specimen collection/handling,  submission of specimen other than nasopharyngeal swab, presence of viral mutation(s) within the areas targeted by this assay, and inadequate number of viral copies(<138 copies/mL). A negative result must be combined with clinical observations, patient history, and epidemiological information. The expected result is Negative.  Fact Sheet for Patients:  EntrepreneurPulse.com.au  Fact Sheet for Healthcare Providers:  IncredibleEmployment.be  This test is no t yet approved or cleared by the [place] FDA and  has been authorized for detection and/or diagnosis of SARS-CoV-2 by FDA under an Emergency Use Authorization (EUA). This EUA will remain  in effect (meaning this test can be used) for the duration of the COVID-19 declaration under Section 564(b)(1) of the Act, 21 U.S.C.section 360bbb-3(b)(1), unless the authorization is terminated  or revoked sooner.       Influenza A by PCR NEGATIVE NEGATIVE Final   Influenza B by PCR NEGATIVE NEGATIVE Final    Comment: (NOTE) The Xpert Xpress SARS-CoV-2/FLU/RSV plus assay is intended as an aid in the diagnosis of influenza from Nasopharyngeal swab specimens and should not be used as a sole basis for treatment. Nasal washings and aspirates are unacceptable for Xpert Xpress SARS-CoV-2/FLU/RSV testing.  Fact Sheet for Patients: EntrepreneurPulse.com.au  Fact Sheet for Healthcare Providers: IncredibleEmployment.be  This test is not yet approved or cleared by the [place] FDA and has been authorized for detection and/or diagnosis of SARS-CoV-2 by FDA under an Emergency Use Authorization (EUA). This EUA will remain in effect (meaning this test can be used) for the duration of the COVID-19 declaration under Section 564(b)(1) of the Act, 21 U.S.C. section 360bbb-3(b)(1), unless the authorization is terminated or revoked.  Performed at [facility], [address] [place], [place] [address]   Body fluid culture w Gram Stain     Status: None   Collection Time: 10/06/2021 11:40 PM   Specimen: Peritoneal Washings; Body Fluid  Result Value Ref Range Status   Specimen Description   Final    PERITONEAL DIALYSIS Performed at [facility], [address], [place], [place] [address]    Special Requests   Final    NONE Performed at [facility], [address] [place], [place] [address]    Gram Stain   Final    NO SQUAMOUS EPITHELIAL CELLS SEEN NO WBC SEEN NO ORGANISMS SEEN    Culture   Final    NO GROWTH 3 DAYS Performed at [facility], [place] [address] [place],  [address]    Report Status 10/20/2021 FINAL  Final  MRSA Next Gen by PCR, Nasal     Status: None   Collection Time: 10/18/21  1:40 PM   Specimen: Nasal Mucosa; Nasal Swab  Result Value Ref Range Status   MRSA by PCR Next Gen  NOT DETECTED NOT DETECTED Final    Comment: (NOTE) The GeneXpert MRSA Assay (FDA approved for NASAL specimens only), is one component of a comprehensive MRSA colonization surveillance program. It is not intended to diagnose MRSA infection nor to guide or monitor treatment for MRSA infections. Test performance is not FDA approved in patients less than 41 years old. Performed at [facility], [address] [place], [place] [address]     Coagulation Studies: No results for input(s): LABPROT, INR in the last 72 hours.   Urinalysis: No results for input(s): COLORURINE, LABSPEC, PHURINE, GLUCOSEU, HGBUR, BILIRUBINUR, KETONESUR, PROTEINUR, UROBILINOGEN, NITRITE, LEUKOCYTESUR in the last 72 hours.  Invalid input(s): APPERANCEUR    Imaging: US Venous Img Upper Uni Right(DVT)  Result Date: 10/22/2021 CLINICAL DATA:  Right arm swelling EXAM: RIGHT UPPER EXTREMITY VENOUS DOPPLER ULTRASOUND TECHNIQUE: Gray-scale sonography with graded compression, as well as color Doppler and duplex ultrasound were performed  to evaluate the upper extremity deep venous system from the level of the subclavian vein and including the jugular, axillary, basilic, radial, ulnar and upper cephalic vein. Spectral Doppler was utilized to evaluate flow at rest and with distal augmentation maneuvers. COMPARISON:  None. FINDINGS: Contralateral Subclavian Vein: Respiratory phasicity is normal and symmetric with the symptomatic side. No evidence of thrombus. Normal compressibility. Internal Jugular Vein: No evidence of thrombus. Normal compressibility, respiratory phasicity and response to augmentation. Subclavian Vein: No evidence of thrombus. Normal compressibility, respiratory phasicity and response to augmentation. Axillary Vein: No evidence of thrombus. Normal compressibility, respiratory phasicity and response to augmentation. Cephalic Vein: No evidence of thrombus. Normal compressibility, respiratory phasicity and response to augmentation. Basilic Vein: No evidence of thrombus. Normal compressibility, respiratory phasicity and response to augmentation. Brachial Veins: No evidence of thrombus. Normal compressibility, respiratory phasicity and response to augmentation. Radial Veins: No evidence of thrombus. Normal compressibility, respiratory phasicity and response to augmentation. Ulnar Veins: No evidence of thrombus. Normal compressibility, respiratory phasicity and response to augmentation. Venous Reflux:  None visualized. Other Findings:  None visualized. IMPRESSION: No evidence of DVT within the right upper extremity. Electronically Signed   By: Inez Catalina M.D.   On: 10/22/2021 03:39     Medications:    dialysis solution 1.5% low-MG/low-CA      Chlorhexidine Gluconate Cloth  6 each Topical Daily   cloNIDine  0.2 mg Transdermal Weekly   darbepoetin (ARANESP) injection - NON-DIALYSIS  100 mcg Subcutaneous Q Thu-1800   dexamethasone (DECADRON) injection  6 mg Intravenous Q6H   gentamicin cream  1 application Topical Daily    heparin injection (subcutaneous)  5,000 Units Subcutaneous Q8H   insulin aspart  0-5 Units Subcutaneous QHS   insulin aspart  0-9 Units Subcutaneous TID WC   insulin glargine-yfgn  7 Units Subcutaneous Daily   labetalol  10 mg Intravenous Q8H   pantoprazole (PROTONIX) IV  40 mg Intravenous Q24H   acetaminophen **OR** acetaminophen, benzocaine, dianeal solution for CAPD/CCPD with heparin, hydrALAZINE, ondansetron **OR** ondansetron (ZOFRAN) IV  Assessment/ Plan:  [name] is a 81 y.o. white female with end stage renal disease on peritoneal dialysis, diabetes mellitus type II insulin dependent, hypertension, and hyperlipidemia who is admitted to [facility] on 10/07/2021 for Facial swelling [R22.0] Angioedema, initial encounter [T78.3XXA] Fever, unspecified fever cause [R50.9] Angioedema [T78.3XXA]  [facility] 59kg CCPD 5 exchanges 2 liter fills 9 hours  End Stage Renal Disease:  Will continue night PD treatment with instilled heparin.  Monitoring fluid intake with IVF vs  removal with PD treatments.   Hypertension:   ACE inhibitor and ARB on hold. BP stable for this patient  Anemia of chronic kidney disease:  Mircera as outpatient given 09/27/2021. Latest HgB 7.8   Lab Results  Component Value Date   HGB 7.8 (L) 10/22/2021  Aranesp ordered weekly (Thursday)  4.  Angioedema, believed due to prescribed ACE-i, currently held.   Transferred our of ICU on 10/20/21.Continue with dexamethasone. Remains NPO. PO intake as per speech. Continue benzocaine for oral discomfort.   5.  Hypokalemia,  Likely nutritional Expected to correct when she is able to eat normal diet. K today 3.4. F/U labs.     LOS: [place] Brailen Macneal 2/18/202311:45 AM

## 2021-10-22 NOTE — Progress Notes (Signed)
Speech Language Pathology Treatment: Dysphagia  Patient Details Name: Catherine Burke MRN: [id number] DOB: 01-09-1941 Today's Date: 10/22/2021 Time: 3128-1188 SLP Time Calculation (min) (ACUTE ONLY): 53 min  Assessment / Plan / Recommendation Clinical Impression  Pt seen for ongoing dysphagia. Nephrologist, passes along that pt says she is hungry and thirsty. When entering the room, pt's mouth was in need of oral care with a foul odor being emitted from her mouth. This Probation officer located an oral care kit/box from another floor that contains suction toothbrushes and chlorhexidine. Pt and SLP spent > 30 minutes cleaning the roof of her mouth. Extensive education provided to pt's nurse, NT and MD on need for Q2hours oral care with suction toothbrush, toothpaste and rinse with chlorhexidine. PLEASE DO NOT USE SWAB/SPONGE for oral care. MD and SLP placed appropriate orders.   Skilled observation was providing of pt consuming ice chips, puree, nectar thick liquids and Nepro (unthickened) via spoon. Pt demonstrates moderate difficulty with lingual manipulation of puree and suspect posterior propulsion was impaired. With ice chips, pt likely had difficulty containing the melted liquid orally while still having part of ice chip in mouth. This resulted in throat clears d/t possible delayed swallow initiation. When consuming nectar thick liquids and Nepro (unthickened) via spoon pt consumed with the appearance of a timely swallow initiation with one throat clear during consumption of 20 ounces of liquids. At this time, recommend initiating a clear liquid diet thickened to nectar thick with Nepro allowed (unthickened) via spoon.     HPI HPI: Pt  is a 81 y.o. 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  According to the patient and family since yesterday,  patient was admitted to [facility] hospital 2 days ago for pneumoperitoneum suspected to be related to peritoneal dialysis, discharged home and then return to the emergency department at [facility] yesterday for bilateral shoulder pain, mild abdominal pain which was evaluated with no emergent or is new abnormal findings; no pneumonia.  She was discharged home without any interventions and states the orofacial issues have worsened since yesterday.  Per chart review these issues were not noted or evaluated in the emergency department yesterday.  She did have a low-grade fever yesterday per family members but no fever today.  No known dental issues that they are aware of.  She has not been eating or drinking well because of the pain in her swollen mouth/facial area and difficulty swallowing.  Not trying any medications for symptoms thus far.  History of similar issues in the past.   CT of Neck: Right lower facial soft tissue swelling, possibly cellulitis. No  drainable fluid collection identified.  2. Moderate hypopharyngeal swelling, new from 67/73/7366 and of  uncertain etiology though possibly infectious or allergic.   CXR: no active disease.      SLP Plan  Continue with current plan of care      Recommendations for follow up therapy are one component of a multi-disciplinary discharge planning process, led by the attending physician.  Recommendations may be updated based on patient status, additional functional criteria and insurance authorization.    Recommendations  Diet recommendations: Nectar-thick liquid Liquids provided via: Teaspoon Medication Administration: Via alternative means Supervision: Intermittent supervision to cue for compensatory strategies Compensations: Minimize environmental distractions;Slow rate;Small sips/bites Postural Changes and/or Swallow Maneuvers: Seated upright 90 degrees;Upright 30-60 min after meal  Oral Care Recommendations:  (oral care kit obtained  and provided - use chlorhexidine every 2 hours) Follow Up Recommendations: Skilled nursing-short term rehab (<3 hours/day) Assistance recommended at discharge: Frequent or constant Supervision/Assistance SLP Visit Diagnosis: Dysphagia, oropharyngeal phase (R13.12) Plan: Continue with current plan of care         Maigen Mozingo B. Rutherford Nail M.S., CCC-SLP, [place] Office [phone]   Stormy Fabian  10/22/2021, 3:05 PM

## 2021-10-22 NOTE — Progress Notes (Signed)
PROGRESS NOTE    Catherine Burke  MWU:[id number] DOB: 08-Jun-1941 DOA: 10/24/2021 PCP: Idelle Crouch, MD  [id number]/[id number]   Assessment & Plan:   Principal Problem:   Angioedema, initial encounter Active Problems:   Hyperlipidemia   Essential hypertension   GERD (gastroesophageal reflux disease)   Chronic kidney disease (CKD), stage IV (severe) (HCC)   ESRD (end stage renal disease) ([place])   Facial cellulitis   Malnutrition of moderate degree   Angioedema   81 year old with medical history of end-stage renal disease on peritoneal dialysis, hypertension, insulin-dependent diabetes mellitus, who presented emergency department for chief concerns of right facial swelling.   Facial swelling thought to be due to angioedema secondary to ACE inhibitor use.  ACE inhibitor's been discontinued and added to allergy list.   2/14:  Patient had worsening of her facial and tongue swelling.  Pt transferred to [facility] and PCCM consulted on 2/14 for close monitoring.   * Angioedema --s/p -Solu-Medrol 125 mg x 1, Benadryl 25 mg x 1, Pepcid 20 mg IV x1 -EpiPen x1 Plan: --cont IV decadron --no need for further IV Benadryl --oral care per SLP order  Dysphagia --due to tongue swelling and poor lingual control of boluses and coordination  --Dobhoff attempted by RN on 2/17 but unsuccessful.  Discussed with dietician, no indication to start TPN, will wait for IR to place Dobhoff tube on Monday and start tube feed then if pt still isn't safe to have oral intake. --MIVF@50  for now --SLP eval today, cleared for nectar thick liquid and regular nepro with spoon  Essential hypertension Poor control over interval --home ARB d/c'ed --cont clonidine patch 0.2 mg --cont IV labetalol 10 mg q8h for now   ESRD on peritoneal dialysis (end stage renal disease) ([place]) --iPD per nephrology   Facial cellulitis Assessment & Plan Facial cellulitis felt unlikely - DC'ed antibiotics  Anemia of CKD --[facility] as  outpatient  Afib, isolated episode --noted early morning 2/15, new dx, likely related to acute illness --started on heparin gtt, since d/c'ed --currently rate controlled and back to NSR  Hypokalemia --replete with IV potassium while NPO  DM2, well controlled Hyperglycemia exacerbated by steroid use --A1c 5.7 --increase glargine to 12u daily --SSI    DVT prophylaxis: Lovenox SQ Code Status: Limited code  Family Communication:   Level of care: Telemetry Medical Dispo:   The patient is from: home Anticipated d/c is to: home Anticipated d/c date is: 2-3 days Patient currently is not medically ready to d/c due to: dysphagia due to angioedema    Subjective and Interval History:  SLP help with oral care today and found pt now able to take spoonful of fluids.  Complained of left arm soreness.   Objective: Vitals:   10/22/21 0912 10/22/21 1305 10/22/21 1456 10/22/21 1616  BP: (!) 150/70 138/81 (!) 153/74 (!) 142/54  Pulse: 84 79 82 81  Resp: 16 16  16   Temp: (!) 97.5 F (36.4 C) 98.2 F (36.8 C)  97.8 F (36.6 C)  TempSrc: Oral Oral  Oral  SpO2: 98% 100%  99%  Weight:      Height:        Intake/Output Summary (Last 24 hours) at 10/22/2021 1733 Last data filed at 10/22/2021 0914 Gross per 24 hour  Intake 10000 ml  Output 9745 ml  Net 255 ml   Filed Weights   10/21/21 0847 10/21/21 1943 10/22/21 0523  Weight: 57.2 kg 57.4 kg 64.1 kg    Examination:  Constitutional: NAD, AAOx3, sitting in recliner HEENT: conjunctivae and lids normal, EOMI, tongue with superficial ulceration CV: No cyanosis.   RESP: normal respiratory effort, on RA Extremities: No effusions, edema in BLE SKIN: warm, dry Neuro: II - XII grossly intact.   Psych: Normal mood and affect.  Appropriate judgement and reason   Data Reviewed: I have personally reviewed following labs and imaging studies  CBC: Recent Labs  Lab 10/15/21 2200 10/13/2021 1621 10/17/21 0503 10/20/21 0440  10/21/21 0424 10/22/21 0503  WBC 5.2 4.0 5.7 16.4* 13.6* 13.2*  NEUTROABS 4.6 3.4 5.4  --   --   --   HGB 8.2* 9.9* 8.4* 8.9* 8.0* 7.8*  HCT 26.4* 32.2* 26.7* 27.6* 25.2* 24.9*  MCV 89.8 90.4 88.7 84.9 85.4 87.1  PLT 246 310 266 263 241 010   Basic Metabolic Panel: Recent Labs  Lab 10/06/2021 2158 10/17/21 0503 10/19/21 0458 10/20/21 0440 10/21/21 0424 10/22/21 0503  NA  --  132* 132* 132* 133* 134*  K  --  3.6 3.2* 3.3* 3.5 3.4*  CL  --  96* 96* 95* 98 97*  CO2  --  23 25 26 26 25   GLUCOSE  --  327* 279* 236* 304* 351*  BUN  --  54* 51* 59* 61* 64*  CREATININE  --  6.80* 5.55* 5.30* 5.13* 4.91*  CALCIUM  --  7.2* 7.2* 7.8* 7.7* 7.7*  MG 1.7  --  1.6* 2.3 2.1 2.0  PHOS  --  5.2*  --   --   --   --    GFR: Estimated Creatinine Clearance: 7.6 mL/min (A) (by C-G formula based on SCr of 4.91 mg/dL (H)). Liver Function Tests: Recent Labs  Lab 10/15/21 2200 10/18/2021 1759  AST 24 26  ALT 10 10  ALKPHOS 65 59  BILITOT 0.7 0.5  PROT 6.0* 5.9*  ALBUMIN 1.9* 1.9*   No results for input(s): LIPASE, AMYLASE in the last 168 hours. No results for input(s): AMMONIA in the last 168 hours. Coagulation Profile: Recent Labs  Lab 10/15/21 2248 10/08/2021 1759  INR 1.2 1.3*   Cardiac Enzymes: No results for input(s): CKTOTAL, CKMB, CKMBINDEX, TROPONINI in the last 168 hours. BNP (last 3 results) No results for input(s): PROBNP in the last 8760 hours. HbA1C: No results for input(s): HGBA1C in the last 72 hours.  CBG: Recent Labs  Lab 10/21/21 1629 10/21/21 2152 10/22/21 1000 10/22/21 1231 10/22/21 1615  GLUCAP 136* 215* 264* 299* 290*   Lipid Profile: No results for input(s): CHOL, HDL, LDLCALC, TRIG, CHOLHDL, LDLDIRECT in the last 72 hours. Thyroid Function Tests: No results for input(s): TSH, T4TOTAL, FREET4, T3FREE, THYROIDAB in the last 72 hours.  Anemia Panel: No results for input(s): VITAMINB12, FOLATE, FERRITIN, TIBC, IRON, RETICCTPCT in the last 72  hours. Sepsis Labs: Recent Labs  Lab 10/15/21 2200 10/07/2021 0057 10/27/2021 1718 10/15/2021 2158  PROCALCITON  --   --   --  6.86  LATICACIDVEN 1.0 0.7 2.0* 2.2*    Recent Results (from the past 240 hour(s))  Body fluid culture w Gram Stain     Status: None   Collection Time: 10/13/21 12:41 PM   Specimen: Peritoneal Washings; Body Fluid  Result Value Ref Range Status   Specimen Description   Final    PERITONEAL Performed at [facility], [address], [place], [place] [address]    Special Requests   Final    PERITONEAL Performed at [facility] [place],  [place] [address]    Gram Stain   Final    NO SQUAMOUS EPITHELIAL CELLS SEEN FEW WBC SEEN NO ORGANISMS SEEN    Culture   Final    NO GROWTH 3 DAYS Performed at [facility], [address], [place], [place] [address]    Report Status 10/17/2021 FINAL  Final  Resp Panel by RT-PCR (Flu A&B, Covid) Nasopharyngeal Swab     Status: None   Collection Time: 10/13/21  3:23 PM   Specimen: Nasopharyngeal Swab; Nasopharyngeal(NP) swabs in vial transport medium  Result Value Ref Range Status   SARS Coronavirus 2 by RT PCR NEGATIVE NEGATIVE Final    Comment: (NOTE) SARS-CoV-2 target nucleic acids are NOT DETECTED.  The SARS-CoV-2 RNA is generally detectable in upper respiratory specimens during the acute phase of infection. The lowest concentration of SARS-CoV-2 viral copies this assay can detect is 138 copies/mL. A negative result does not preclude SARS-Cov-2 infection and should not be used as the sole basis for treatment or other patient management decisions. A negative result may occur with  improper specimen collection/handling, submission of specimen other than nasopharyngeal swab, presence of viral mutation(s) within the areas targeted by this assay, and inadequate number of viral copies(<138 copies/mL). A negative result must be combined with clinical observations, patient  history, and epidemiological information. The expected result is Negative.  Fact Sheet for Patients:  EntrepreneurPulse.com.au  Fact Sheet for Healthcare Providers:  IncredibleEmployment.be  This test is no t yet approved or cleared by the [place] FDA and  has been authorized for detection and/or diagnosis of SARS-CoV-2 by FDA under an Emergency Use Authorization (EUA). This EUA will remain  in effect (meaning this test can be used) for the duration of the COVID-19 declaration under Section 564(b)(1) of the Act, 21 U.S.C.section 360bbb-3(b)(1), unless the authorization is terminated  or revoked sooner.       Influenza A by PCR NEGATIVE NEGATIVE Final   Influenza B by PCR NEGATIVE NEGATIVE Final    Comment: (NOTE) The Xpert Xpress SARS-CoV-2/FLU/RSV plus assay is intended as an aid in the diagnosis of influenza from Nasopharyngeal swab specimens and should not be used as a sole basis for treatment. Nasal washings and aspirates are unacceptable for Xpert Xpress SARS-CoV-2/FLU/RSV testing.  Fact Sheet for Patients: EntrepreneurPulse.com.au  Fact Sheet for Healthcare Providers: IncredibleEmployment.be  This test is not yet approved or cleared by the [place] FDA and has been authorized for detection and/or diagnosis of SARS-CoV-2 by FDA under an Emergency Use Authorization (EUA). This EUA will remain in effect (meaning this test can be used) for the duration of the COVID-19 declaration under Section 564(b)(1) of the Act, 21 U.S.C. section 360bbb-3(b)(1), unless the authorization is terminated or revoked.  Performed at [facility], [address] [place], [place] [address]   Culture, blood (Routine x 2)     Status: None   Collection Time: 10/15/21 10:00 PM   Specimen: BLOOD LEFT WRIST  Result Value Ref Range Status   Specimen Description   Final    BLOOD LEFT WRIST BOTTLES DRAWN  AEROBIC AND ANAEROBIC   Special Requests Blood Culture adequate volume  Final   Culture   Final    NO GROWTH 5 DAYS Performed at [facility], [address], [place], [place] [address]    Report Status 10/20/2021 FINAL  Final  Culture, blood (Routine x 2)     Status: None   Collection Time: 10/15/21 10:44 PM   Specimen:  BLOOD RIGHT ARM  Result Value Ref Range Status   Specimen Description BLOOD RIGHT ARM  Final   Special Requests   Final    BOTTLES DRAWN AEROBIC AND ANAEROBIC Blood Culture adequate volume   Culture   Final    NO GROWTH 5 DAYS Performed at [facility], [address] [place], [place] [address]    Report Status 10/20/2021 FINAL  Final  Resp Panel by RT-PCR (Flu A&B, Covid) Nasopharyngeal Swab     Status: None   Collection Time: 10/18/2021  5:18 PM   Specimen: Nasopharyngeal Swab; Nasopharyngeal(NP) swabs in vial transport medium  Result Value Ref Range Status   SARS Coronavirus 2 by RT PCR NEGATIVE NEGATIVE Final    Comment: (NOTE) SARS-CoV-2 target nucleic acids are NOT DETECTED.  The SARS-CoV-2 RNA is generally detectable in upper respiratory specimens during the acute phase of infection. The lowest concentration of SARS-CoV-2 viral copies this assay can detect is 138 copies/mL. A negative result does not preclude SARS-Cov-2 infection and should not be used as the sole basis for treatment or other patient management decisions. A negative result may occur with  improper specimen collection/handling, submission of specimen other than nasopharyngeal swab, presence of viral mutation(s) within the areas targeted by this assay, and inadequate number of viral copies(<138 copies/mL). A negative result must be combined with clinical observations, patient history, and epidemiological information. The expected result is Negative.  Fact Sheet for Patients:  EntrepreneurPulse.com.au  Fact Sheet for Healthcare Providers:   IncredibleEmployment.be  This test is no t yet approved or cleared by the [place] FDA and  has been authorized for detection and/or diagnosis of SARS-CoV-2 by FDA under an Emergency Use Authorization (EUA). This EUA will remain  in effect (meaning this test can be used) for the duration of the COVID-19 declaration under Section 564(b)(1) of the Act, 21 U.S.C.section 360bbb-3(b)(1), unless the authorization is terminated  or revoked sooner.       Influenza A by PCR NEGATIVE NEGATIVE Final   Influenza B by PCR NEGATIVE NEGATIVE Final    Comment: (NOTE) The Xpert Xpress SARS-CoV-2/FLU/RSV plus assay is intended as an aid in the diagnosis of influenza from Nasopharyngeal swab specimens and should not be used as a sole basis for treatment. Nasal washings and aspirates are unacceptable for Xpert Xpress SARS-CoV-2/FLU/RSV testing.  Fact Sheet for Patients: EntrepreneurPulse.com.au  Fact Sheet for Healthcare Providers: IncredibleEmployment.be  This test is not yet approved or cleared by the [place] FDA and has been authorized for detection and/or diagnosis of SARS-CoV-2 by FDA under an Emergency Use Authorization (EUA). This EUA will remain in effect (meaning this test can be used) for the duration of the COVID-19 declaration under Section 564(b)(1) of the Act, 21 U.S.C. section 360bbb-3(b)(1), unless the authorization is terminated or revoked.  Performed at [facility], [address] [place], [place] [address]   Body fluid culture w Gram Stain     Status: None   Collection Time: 10/09/2021 11:40 PM   Specimen: Peritoneal Washings; Body Fluid  Result Value Ref Range Status   Specimen Description   Final    PERITONEAL DIALYSIS Performed at [facility], [address] [place], [place] [address]    Special Requests   Final    NONE Performed at [facility], [place], [place] [address]    Gram Stain   Final    NO SQUAMOUS EPITHELIAL CELLS SEEN NO WBC SEEN NO ORGANISMS SEEN  Culture   Final    NO GROWTH 3 DAYS Performed at [facility], [place] [address] [place], [place] [address]    Report Status 10/20/2021 FINAL  Final  MRSA Next Gen by PCR, Nasal     Status: None   Collection Time: 10/18/21  1:40 PM   Specimen: Nasal Mucosa; Nasal Swab  Result Value Ref Range Status   MRSA by PCR Next Gen NOT DETECTED NOT DETECTED Final    Comment: (NOTE) The GeneXpert MRSA Assay (FDA approved for NASAL specimens only), is one component of a comprehensive MRSA colonization surveillance program. It is not intended to diagnose MRSA infection nor to guide or monitor treatment for MRSA infections. Test performance is not FDA approved in patients less than 75 years old. Performed at [facility], [address], [place], [place] [address]       Radiology Studies: US Venous Img Upper Uni Right(DVT)  Result Date: 10/22/2021 CLINICAL DATA:  Right arm swelling EXAM: RIGHT UPPER EXTREMITY VENOUS DOPPLER ULTRASOUND TECHNIQUE: Gray-scale sonography with graded compression, as well as color Doppler and duplex ultrasound were performed to evaluate the upper extremity deep venous system from the level of the subclavian vein and including the jugular, axillary, basilic, radial, ulnar and upper cephalic vein. Spectral Doppler was utilized to evaluate flow at rest and with distal augmentation maneuvers. COMPARISON:  None. FINDINGS: Contralateral Subclavian Vein: Respiratory phasicity is normal and symmetric with the symptomatic side. No evidence of thrombus. Normal compressibility. Internal Jugular Vein: No evidence of thrombus. Normal compressibility, respiratory phasicity and response to augmentation. Subclavian Vein: No evidence of thrombus. Normal compressibility, respiratory phasicity and response to augmentation. Axillary Vein: No evidence of thrombus.  Normal compressibility, respiratory phasicity and response to augmentation. Cephalic Vein: No evidence of thrombus. Normal compressibility, respiratory phasicity and response to augmentation. Basilic Vein: No evidence of thrombus. Normal compressibility, respiratory phasicity and response to augmentation. Brachial Veins: No evidence of thrombus. Normal compressibility, respiratory phasicity and response to augmentation. Radial Veins: No evidence of thrombus. Normal compressibility, respiratory phasicity and response to augmentation. Ulnar Veins: No evidence of thrombus. Normal compressibility, respiratory phasicity and response to augmentation. Venous Reflux:  None visualized. Other Findings:  None visualized. IMPRESSION: No evidence of DVT within the right upper extremity. Electronically Signed   By: Inez Catalina M.D.   On: 10/22/2021 03:39     Scheduled Meds:  chlorhexidine  15 mL Mouth/Throat Daily   Chlorhexidine Gluconate Cloth  6 each Topical Daily   cloNIDine  0.2 mg Transdermal Weekly   darbepoetin (ARANESP) injection - NON-DIALYSIS  100 mcg Subcutaneous Q Thu-1800   dexamethasone (DECADRON) injection  6 mg Intravenous Q6H   feeding supplement (NEPRO CARB STEADY)  237 mL Oral TID   gentamicin cream  1 application Topical Daily   heparin injection (subcutaneous)  5,000 Units Subcutaneous Q8H   insulin aspart  0-5 Units Subcutaneous QHS   insulin aspart  0-9 Units Subcutaneous TID WC   insulin glargine-yfgn  7 Units Subcutaneous Daily   labetalol  10 mg Intravenous Q8H   pantoprazole (PROTONIX) IV  40 mg Intravenous Q24H   Continuous Infusions:  sodium chloride 50 mL/hr at 10/22/21 1238   dialysis solution 1.5% low-MG/low-CA       LOS: 4 days     Enzo Bi, MD [facility] If 7PM-7AM, please contact night-coverage 10/22/2021, 5:33 PM

## 2021-10-22 NOTE — Evaluation (Addendum)
Physical Therapy Evaluation Patient Details Name: Catherine Burke MRN: [id number] DOB: [birth year] Today's Date: 10/22/2021  History of Present Illness  Pt is an 81 y/o F admitted 10/26/2021 after presenting to the ED with concerns of R facial swelling. Facial swelling thought to be due to angioedema 2/2 ACE inhibitor use. PMH: ESRD on PD, HTN, IDDM, HLD  Clinical Impression  Pt seen for PT evaluation with pt reporting prior to admission she performed household mobility with QC with her family providing PRN assistance (brought ~1 meal/day, assisted with moving dialysis bags, transportation) but otherwise pt was having difficulty caring for herself at home & notes it's "not easy". On this date, pt is able to complete supine>sit without physical assistance but relies heavily on hospital bed features (anticipate she would require assistance from flat bed without rails). Pt requires max assist & RW for successful sit>stand from EOB & recliner but is able to ambulate short distance in room with RW & min assist. Pt does not dizziness upon supine>sit & sit>stand that goes away with rest. Pt is unsafe to d/c home alone at this time & would benefit from STR upon d/c to maximize independence with functional mobility & reduce fall risk prior to return home.        Recommendations for follow up therapy are one component of a multi-disciplinary discharge planning process, led by the attending physician.  Recommendations may be updated based on patient status, additional functional criteria and insurance authorization.  Follow Up Recommendations Skilled nursing-short term rehab (<3 hours/day)    Assistance Recommended at Discharge Intermittent Supervision/Assistance  Patient can return home with the following  A lot of help with walking and/or transfers;A lot of help with bathing/dressing/bathroom;Assist for transportation;Assistance with cooking/housework;Help with stairs or ramp for entrance    Equipment  Recommendations Rolling walker (2 wheels);BSC/3in1  Recommendations for Other Services  OT consult    Functional Status Assessment Patient has had a recent decline in their functional status and demonstrates the ability to make significant improvements in function in a reasonable and predictable amount of time.     Precautions / Restrictions Precautions Precautions: Fall Precaution Comments: peritoneal dialysis catheter in abdomen Restrictions Weight Bearing Restrictions: No      Mobility  Bed Mobility Overal bed mobility: Needs Assistance Bed Mobility: Supine to Sit     Supine to sit: Supervision, HOB elevated (relies on HOB elevated & Bed rail, extra time to scoot to sitting EOB)          Transfers Overall transfer level: Needs assistance Equipment used: 1 person hand held assist, Rolling walker (2 wheels) Transfers: Sit to/from Stand, Bed to chair/wheelchair/BSC Sit to Stand: Max assist (Attempted sit>stand with HHA but pt unable even with max assist. Pt requires max assist for successful sit>stand with RW with LUE resting on RW, RUE pushing to stand.)   Step pivot transfers: Min assist (Min assist once pt is upright she is able to take steps to recliner)            Ambulation/Gait Ambulation/Gait assistance: Min assist Gait Distance (Feet): 15 Feet Assistive device: Rolling walker (2 wheels) Gait Pattern/deviations: Decreased step length - right, Decreased step length - left, Decreased stride length Gait velocity: decreased        Stairs            Wheelchair Mobility    Modified Rankin (Stroke Patients Only)       Balance Overall balance assessment: Needs assistance Sitting-balance support: Feet supported Sitting  balance-Leahy Scale: Fair Sitting balance - Comments: supervision static sitting   Standing balance support: Bilateral upper extremity supported, During functional activity, Reliant on assistive device for balance Standing  balance-Leahy Scale: Poor                               Pertinent Vitals/Pain Pain Assessment Pain Assessment: Faces Faces Pain Scale: Hurts a little bit Pain Location: buttocks Pain Descriptors / Indicators: Discomfort Pain Intervention(s): Repositioned (notified nurse)    Home Living Family/patient expects to be discharged to:: Private residence Living Arrangements: Alone Available Help at Discharge: Family;Available PRN/intermittently Type of Home: House Home Access:  (front & back entrance has stais (4-6) but then pt notes she also has a ramp)       Home Layout: One level Home Equipment: Conservation officer, nature (2 wheels);Cane - quad      Prior Function Prior Level of Function : Needs assist       Physical Assist : ADLs (physical)     Mobility Comments: Pt reports she ambulates in the home with her Quad cane, denies falls. States she can hobble into the bathroom and occasionally make a pancake. Son brings in dialysis bags but she does the rest but notes it's "not easy". ADLs Comments: Family does the driving, brings her ~1 meal/day, pt sink bathes and occasionally takes a shower once every 6 months. States she can dress herself but will usually sleep in the clothes she plans to wear the next day.     Hand Dominance        Extremity/Trunk Assessment   Upper Extremity Assessment Upper Extremity Assessment: Generalized weakness (BUE edema & bruising noted)    Lower Extremity Assessment Lower Extremity Assessment: Generalized weakness       Communication   Communication: No difficulties  Cognition Arousal/Alertness: Awake/alert Behavior During Therapy: WFL for tasks assessed/performed Overall Cognitive Status: Within Functional Limits for tasks assessed                                 General Comments: Sweet lady        General Comments      Exercises General Exercises - Lower Extremity Long Arc Quad: AROM, Strengthening, Both, 10  reps, Seated Hip Flexion/Marching: AROM, Strengthening, Both, 10 reps, Seated   Assessment/Plan    PT Assessment Patient needs continued PT services  PT Problem List Decreased strength;Decreased mobility;Cardiopulmonary status limiting activity;Decreased activity tolerance;Decreased balance;Decreased knowledge of use of DME       PT Treatment Interventions DME instruction;Therapeutic exercise;Balance training;Gait training;Stair training;Neuromuscular re-education;Modalities;Functional mobility training;Therapeutic activities;Patient/family education    PT Goals (Current goals can be found in the Care Plan section)  Acute Rehab PT Goals Patient Stated Goal: get better PT Goal Formulation: With patient Time For Goal Achievement: 11/05/21 Potential to Achieve Goals: Good    Frequency Min 2X/week     Co-evaluation               AM-PAC PT "6 Clicks" Mobility  Outcome Measure Help needed turning from your back to your side while in a flat bed without using bedrails?: None Help needed moving from lying on your back to sitting on the side of a flat bed without using bedrails?: A Little Help needed moving to and from a bed to a chair (including a wheelchair)?: A Little Help needed standing up from a chair using  your arms (e.g., wheelchair or bedside chair)?: A Lot Help needed to walk in hospital room?: A Lot Help needed climbing 3-5 steps with a railing? : Total 6 Click Score: 15    End of Session Equipment Utilized During Treatment: Gait belt Activity Tolerance: Patient tolerated treatment well;Patient limited by fatigue Patient left: in chair;with chair alarm set;with call bell/phone within reach Nurse Communication: Mobility status (c/o buttocks pain) PT Visit Diagnosis: Muscle weakness (generalized) (M62.81);Difficulty in walking, not elsewhere classified (R26.2);Unsteadiness on feet (R26.81)    Time: 4599-7741 PT Time Calculation (min) (ACUTE ONLY): 29 min   Charges:    PT Evaluation $PT Eval Moderate Complexity: 1 Mod PT Treatments $Therapeutic Activity: 8-22 mins        Lavone Nian, PT, DPT 10/22/21, 10:37 AM   Waunita Schooner 10/22/2021, 10:33 AM

## 2021-10-22 NOTE — Evaluation (Signed)
Occupational Therapy Evaluation Patient Details Name: Catherine Burke MRN: [id number] DOB: [birth year] Today's Date: 10/22/2021   History of Present Illness Pt is an 81 y/o F admitted 11/01/2021 after presenting to the ED with concerns of R facial swelling. Facial swelling thought to be due to angioedema 2/2 ACE inhibitor use. PMH: ESRD on PD, HTN, IDDM, HLD   Clinical Impression   Pt seen for OT evaluation this date. She reports living alone in [facility] with ramped entrance with assist from son for heavy lifting such as bringing in her peritoneal dialysis bags. She reports being able to perform light meal prep, but relying on family for one hot meal per day. She currently presents with significant weakness and deconditioning. She requires MAX A for transfers with RW and LB ADLs and SETUP to MIN A for seated UB ADLS d/t UE edema. She participates in bathing tasks this session with SETUP for UB bathing and MIN A for dressing and requires MAX A for standing LB bathing d/t poor standing balance. She is returned to bed end of session with all needs met and in reach with her son and granddaughter visiting. OT will continue to follow acutely. Currently recommending STR f/u OT services to regain strength and then pt is to return home with family (son Jenny Reichmann and Camera operator) moving in to support her.       Recommendations for follow up therapy are one component of a multi-disciplinary discharge planning process, led by the attending physician.  Recommendations may be updated based on patient status, additional functional criteria and insurance authorization.   Follow Up Recommendations  Skilled nursing-short term rehab (<3 hours/day)    Assistance Recommended at Discharge Frequent or constant Supervision/Assistance  Patient can return home with the following A lot of help with walking and/or transfers;A lot of help with bathing/dressing/bathroom;Assistance with cooking/housework;Direct supervision/assist for  medications management;Direct supervision/assist for financial management;Assist for transportation;Help with stairs or ramp for entrance    Functional Status Assessment  Patient has had a recent decline in their functional status and demonstrates the ability to make significant improvements in function in a reasonable and predictable amount of time.  Equipment Recommendations  Other (comment) (defer to next level of care)    Recommendations for Other Services       Precautions / Restrictions Precautions Precautions: Fall Precaution Comments: peritoneal dialysis catheter in abdomen Restrictions Weight Bearing Restrictions: No      Mobility Bed Mobility Overal bed mobility: Needs Assistance Bed Mobility: Sit to Supine       Sit to supine: Min assist   General bed mobility comments: MIN A for LEs back to bed    Transfers Overall transfer level: Needs assistance Equipment used: Rolling walker (2 wheels) Transfers: Sit to/from Stand, Bed to chair/wheelchair/BSC Sit to Stand: Max assist Stand pivot transfers: Max assist         General transfer comment: MIN A once up, but MAX A to extend hips/clear bottom to full stand      Balance Overall balance assessment: Needs assistance Sitting-balance support: Feet supported Sitting balance-Leahy Scale: Fair Sitting balance - Comments: supervision static sitting   Standing balance support: Bilateral upper extremity supported, During functional activity, Reliant on assistive device for balance Standing balance-Leahy Scale: Poor                             ADL either performed or assessed with clinical judgement   ADL  General ADL Comments: requires SETUP to MIN A for seated UB ADLs d/t weakness/limited UE ROM 2/2 edema, MAX A for seated LB ADLs including donning socks and MAX A for standing LB ADLs including peri care d/t poor dynamic balance. Requires MAX  A with RW for STS and able to take steps with MIN A, but noted to fatigue and R knee starts buckling.     Vision Patient Visual Report: No change from baseline       Perception     Praxis      Pertinent Vitals/Pain Pain Assessment Pain Assessment: Faces Faces Pain Scale: Hurts a little bit Pain Location: buttocks Pain Descriptors / Indicators: Discomfort Pain Intervention(s): Limited activity within patient's tolerance, Monitored during session, Repositioned     Hand Dominance     Extremity/Trunk Assessment Upper Extremity Assessment Upper Extremity Assessment: Generalized weakness (BUE edema & bruising noted)   Lower Extremity Assessment Lower Extremity Assessment: Generalized weakness       Communication Communication Communication: No difficulties   Cognition Arousal/Alertness: Awake/alert Behavior During Therapy: WFL for tasks assessed/performed Overall Cognitive Status: Within Functional Limits for tasks assessed                                 General Comments: Sweet lady     General Comments       Exercises Other Exercises Other Exercises: OT ed re: role. OT engages pt in bathing/dressing tasks as well as toileting and dressing. Ed with pt's son and granddaughter re: UE positioning for edema   Shoulder Instructions      Home Living Family/patient expects to be discharged to:: Private residence Living Arrangements: Alone Available Help at Discharge: Family;Available PRN/intermittently Type of Home: House Home Access:  (front & back entrance has stais (4-6) but then pt notes she also has a ramp)     Home Layout: One level               Home Equipment: Conservation officer, nature (2 wheels);Cane - quad          Prior Functioning/Environment Prior Level of Function : Needs assist       Physical Assist : ADLs (physical)     Mobility Comments: Pt reports she ambulates in the home with her Quad cane, denies falls. States she can hobble  into the bathroom and occasionally make a pancake. Son brings in dialysis bags but she does the rest but notes it's "not easy". ADLs Comments: Family does the driving, brings her ~1 meal/day, pt sink bathes and occasionally takes a shower once every 6 months. States she can dress herself but will usually sleep in the clothes she plans to wear the next day.        OT Problem List: Decreased strength;Decreased activity tolerance      OT Treatment/Interventions: Self-care/ADL training;Therapeutic exercise;Therapeutic activities;DME and/or AE instruction    OT Goals(Current goals can be found in the care plan section) Acute Rehab OT Goals Patient Stated Goal: to go home and have son/granddaughter move in with me OT Goal Formulation: With patient/family Time For Goal Achievement: 11/05/21 Potential to Achieve Goals: Good ADL Goals Pt Will Perform Upper Body Bathing: with set-up;sitting Pt Will Perform Lower Body Bathing: with min guard assist;sitting/lateral leans;with adaptive equipment Pt Will Transfer to Toilet: with min assist;bedside commode Pt Will Perform Toileting - Clothing Manipulation and hygiene: with min assist;sit to/from stand  OT Frequency: Min 2X/week  Co-evaluation              AM-PAC OT "6 Clicks" Daily Activity     Outcome Measure Help from another person eating meals?: None Help from another person taking care of personal grooming?: A Little Help from another person toileting, which includes using toliet, bedpan, or urinal?: A Lot Help from another person bathing (including washing, rinsing, drying)?: A Lot Help from another person to put on and taking off regular upper body clothing?: A Little Help from another person to put on and taking off regular lower body clothing?: A Lot 6 Click Score: 16   End of Session Equipment Utilized During Treatment: Gait belt;Rolling walker (2 wheels) Nurse Communication: Mobility status  Activity Tolerance: Patient  tolerated treatment well Patient left: in bed;with call bell/phone within reach;with bed alarm set;with family/visitor present  OT Visit Diagnosis: Unsteadiness on feet (R26.81);Muscle weakness (generalized) (M62.81)                Time: 1310-1405 OT Time Calculation (min): 55 min Charges:  OT General Charges $OT Visit: 1 Visit OT Evaluation $OT Eval Moderate Complexity: 1 Mod OT Treatments $Self Care/Home Management : 23-37 mins $Therapeutic Activity: 8-22 mins  Gerrianne Scale, MS, OTR/L ascom [phone] 10/22/21, 2:57 PM

## 2021-10-23 DIAGNOSIS — T783XXA Angioneurotic edema, initial encounter: Secondary | ICD-10-CM | POA: Diagnosis not present

## 2021-10-23 LAB — GLUCOSE, CAPILLARY
Glucose-Capillary: 320 mg/dL — ABNORMAL HIGH (ref 70–99)
Glucose-Capillary: 357 mg/dL — ABNORMAL HIGH (ref 70–99)
Glucose-Capillary: 161 mg/dL — ABNORMAL HIGH (ref 70–99)
Glucose-Capillary: 149 mg/dL — ABNORMAL HIGH (ref 70–99)

## 2021-10-23 LAB — MAGNESIUM: Magnesium: 1.9 mg/dL (ref 1.7–2.4)

## 2021-10-23 LAB — BASIC METABOLIC PANEL
Anion gap: 10 (ref 5–15)
BUN: 69 mg/dL — ABNORMAL HIGH (ref 8–23)
CO2: 25 mmol/L (ref 22–32)
Calcium: 7.6 mg/dL — ABNORMAL LOW (ref 8.9–10.3)
Chloride: 97 mmol/L — ABNORMAL LOW (ref 98–111)
Creatinine, Ser: 4.93 mg/dL — ABNORMAL HIGH (ref 0.44–1.00)
GFR, Estimated: 8 mL/min — ABNORMAL LOW (ref >60)
Glucose, Bld: 364 mg/dL — ABNORMAL HIGH (ref 70–99)
Potassium: 3.1 mmol/L — ABNORMAL LOW (ref 3.5–5.1)
Sodium: 132 mmol/L — ABNORMAL LOW (ref 135–145)

## 2021-10-23 LAB — CBC
HCT: 23.9 % — ABNORMAL LOW (ref 36.0–46.0)
Hemoglobin: 7.4 g/dL — ABNORMAL LOW (ref 12.0–15.0)
MCH: 26.9 pg (ref 26.0–34.0)
MCHC: 31 g/dL (ref 30.0–36.0)
MCV: 86.9 fL (ref 80.0–100.0)
Platelets: 205 10*3/uL (ref 150–400)
RBC: 2.75 MIL/uL — ABNORMAL LOW (ref 3.87–5.11)
RDW: 16.6 % — ABNORMAL HIGH (ref 11.5–15.5)
WBC: 14.9 10*3/uL — ABNORMAL HIGH (ref 4.0–10.5)
nRBC: 0 % (ref 0.0–0.2)

## 2021-10-23 MED ORDER — DEXAMETHASONE SODIUM PHOSPHATE 10 MG/ML IJ SOLN
6.0000 mg | Freq: Three times a day (TID) | INTRAMUSCULAR | Status: DC
  Administered 2021-10-24 (×2): 6 mg via INTRAVENOUS
  Filled 2021-10-23 (×2): qty 1

## 2021-10-23 MED ORDER — POTASSIUM CHLORIDE 10 MEQ/100ML IV SOLN
10.0000 meq | INTRAVENOUS | Status: AC
  Administered 2021-10-23 (×4): 10 meq via INTRAVENOUS
  Filled 2021-10-23 (×2): qty 100

## 2021-10-23 NOTE — Progress Notes (Signed)
Speech Language Pathology Treatment:    Patient Details Name: Catherine Burke MRN: [id number] DOB: 1941-05-20 Today's Date: 10/23/2021 Time: 8003-4917 SLP Time Calculation (min) (ACUTE ONLY): 30 min  Assessment / Plan / Recommendation Clinical Impression  Pt seen for diet tolerance. Pt alert and cooperative. Mild articulatory imprecision appreciated likely due to lingual/labial swelling. Daughter and Granddaughter at bedside. Pt consuming items from full liquid (nectar-thick) meal tray upon SLP entrance to room.  Pt observed with nectar-thick soup and tea. Pt able to self feed via teaspoon. No overt or subtle s/sx pharyngeal dysphagia noted during observation.   Oral care provided accordingly. Pt with improved oral care status compared to yesterday per chart review. Tongue continues to appear bruised and swollen with pinkish-tan patches on lingual body. Attempts to debride were limited in effectiveness.   Recommend continuation of full liquid (nectar-thick liquid) diet with safe swallowing strategies/aspiration precautions as outlined below.   SLP to continue to f/u for clinical swallowing re-evaluation, trials of upgraded textures, and dysphagia management.   Pt, pt's family, and RN made aware of diet recommendations, safe swallowing strategies/aspiration precautions, and SLP POC. Pt and family verbalized understanding/agreement.     HPI HPI: Pt  is a 81 y.o. 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  According to the patient and family since yesterday, patient was admitted to [facility] hospital 2 days ago for pneumoperitoneum suspected to be related to peritoneal dialysis, discharged home and then return to the emergency department at [facility] yesterday for bilateral shoulder pain, mild abdominal pain which was evaluated with no  emergent or is new abnormal findings; no pneumonia.  She was discharged home without any interventions and states the orofacial issues have worsened since yesterday.  Per chart review these issues were not noted or evaluated in the emergency department yesterday.  She did have a low-grade fever yesterday per family members but no fever today.  No known dental issues that they are aware of.  She has not been eating or drinking well because of the pain in her swollen mouth/facial area and difficulty swallowing.  Not trying any medications for symptoms thus far.  History of similar issues in the past.   CT of Neck: Right lower facial soft tissue swelling, possibly cellulitis. No  drainable fluid collection identified.  2. Moderate hypopharyngeal swelling, new from 91/50/5697 and of  uncertain etiology though possibly infectious or allergic.   CXR: no active disease.      SLP Plan  Continue with current plan of care      Recommendations for follow up therapy are one component of a multi-disciplinary discharge planning process, led by the attending physician.  Recommendations may be updated based on patient status, additional functional criteria and insurance authorization.    Recommendations  Diet recommendations: Nectar-thick liquid Liquids provided via: Teaspoon Medication Administration: Via alternative means Supervision: Intermittent supervision to cue for compensatory strategies Compensations: Minimize environmental distractions;Slow rate;Small sips/bites Postural Changes and/or Swallow Maneuvers: Seated upright 90 degrees;Upright 30-60 min after meal                Oral Care Recommendations:  (oral care every 2 hours) Follow Up Recommendations: Skilled nursing-short term rehab (<3 hours/day) Assistance recommended at discharge: Frequent or constant Supervision/Assistance SLP Visit Diagnosis: Dysphagia, oropharyngeal phase (R13.12) Plan: Continue with current plan of care  Cherrie Gauze, M.S., [facility] [phone] ([place])   Quintella Baton  10/23/2021, 1:36 PM

## 2021-10-23 NOTE — NC FL2 (Deleted)
[place] LEVEL OF CARE SCREENING TOOL     IDENTIFICATION  Patient Name: Catherine Burke Birthdate: 04-20-1941 Sex: female Admission Date (Current Location): 10/10/2021  [facility] and [place] Number:  Engineering geologist and Address:  [facility], [address], [place], [place] [address]      Provider Number: [id number]  Attending Physician Name and Address:  Enzo Bi, MD  Relative Name and Phone Number:  Lamont Snowball)   [phone] [facility])    Current Level of Care: Hospital Recommended Level of Care: [place] Prior Approval Number:    Date Approved/Denied:   PASRR Number: [id number]  Discharge Plan:      Current Diagnoses: Patient Active Problem List   Diagnosis Date Noted   Malnutrition of moderate degree 10/18/2021   Angioedema 10/18/2021   Angioedema, initial encounter 10/15/2021   Facial cellulitis 10/12/2021   Abdominal pain 10/13/2021   PD catheter dysfunction, initial encounter ([place])    Cellulitis 07/20/2021   Abdominal wall cellulitis 07/20/2021   B12 deficiency 04/04/2021   Symptomatic anemia 07/22/2020   Acute on chronic anemia 07/21/2020   ESRD (end stage renal disease) ([place]) 07/21/2020   Peritoneal dialysis status ([place]) 07/21/2020   Chronic kidney disease (CKD), stage IV (severe) ([place]) 02/08/2018   Proteinuria 10/10/2017   Chronic kidney disease (CKD), stage III (moderate) ([place]) 02/24/2015   Kidney stones    Osteoporosis    GERD (gastroesophageal reflux disease)    Diabetes mellitus ([place]) 01/17/2007   Hyperlipidemia 01/17/2007   Essential hypertension 01/17/2007   OSTEOPENIA 01/17/2007   HYSTERECTOMY, PARTIAL, HX OF 01/17/2007    Orientation RESPIRATION BLADDER Height & Weight     Self, Time, Situation, Place  Normal External catheter Weight: 130 lb 11.7 oz (59.3 kg) Height:  5\' 4"  (162.6 cm)  BEHAVIORAL SYMPTOMS/MOOD NEUROLOGICAL BOWEL NUTRITION STATUS      Continent Diet  (clear liquid; nectar thick)  AMBULATORY STATUS COMMUNICATION OF NEEDS Skin   Limited Assist Verbally  (pressure injury - sacrum)                       Personal Care Assistance Level of Assistance  Bathing, Feeding, Dressing Bathing Assistance: Limited assistance Feeding assistance: Limited assistance Dressing Assistance: Limited assistance     Functional Limitations Info             SPECIAL CARE FACTORS FREQUENCY  PT (By licensed PT), OT (By licensed OT)     PT Frequency: 5 times per week OT Frequency: 5 times per week            Contractures      Additional Factors Info  Code Status, Allergies Code Status Info: partial Allergies Info: Ace Inhibitors, Levofloxacin, Penicillins, Other, Penicillin G, Sulfa Antibiotics           Current Medications (10/23/2021):  This is the current hospital active medication list Current Facility-Administered Medications  Medication Dose Route Frequency Provider Last Rate Last Admin   0.9 %  sodium chloride infusion   Intravenous Continuous Enzo Bi, MD 30 mL/hr at 10/23/21 0937 Rate Change at 10/23/21 4888   acetaminophen (TYLENOL) tablet 650 mg  650 mg Oral Q6H PRN Cox, Amy N, DO       Or   acetaminophen (TYLENOL) suppository 650 mg  650 mg Rectal Q6H PRN Cox, Amy N, DO       benzocaine (ORAJEL) 10 % mucosal gel   Mouth/Throat TID PRN Colon Flattery, NP  Given at 10/21/21 1333   chlorhexidine (PERIDEX) 0.12 % solution 15 mL  15 mL Mouth/Throat Daily Enzo Bi, MD   15 mL at 10/23/21 0948   Chlorhexidine Gluconate Cloth 2 % PADS 6 each  6 each Topical Daily Ralene Muskrat B, MD   6 each at 10/23/21 0948   cloNIDine (CATAPRES - Dosed in mg/24 hr) patch 0.2 mg  0.2 mg Transdermal Weekly Priscella Mann, Sudheer B, MD   0.2 mg at 10/18/21 1809   Darbepoetin Alfa (ARANESP) injection 100 mcg  100 mcg Subcutaneous Q Thu-1800 Murlean Iba, MD   100 mcg at 10/20/21 2311   dexamethasone (DECADRON) injection 6 mg  6 mg Intravenous Q6H  Lockie Mola B, RPH   6 mg at 10/23/21 0948   dialysis solution 1.5% low-MG/low-CA dianeal solution   Intraperitoneal Q24H Kolluru, Sarath, MD   6,000 mL at 10/21/21 2020   feeding supplement (NEPRO CARB STEADY) liquid 237 mL  237 mL Oral TID Enzo Bi, MD   237 mL at 10/23/21 0948   gentamicin cream (GARAMYCIN) 0.1 % 1 application  1 application Topical Daily Kolluru, Sarath, MD   1 application at 86/76/72 1720   heparin 3,000 Units in dialysis solution 1.5% low-MG/low-CA 6,000 mL dialysis solution   Peritoneal Dialysis PRN Pearla Dubonnet, RPH   Given at 10/21/21 2021   heparin injection 5,000 Units  5,000 Units Subcutaneous Q8H Enzo Bi, MD   5,000 Units at 10/23/21 0525   hydrALAZINE (APRESOLINE) injection 10 mg  10 mg Intravenous Q4H PRN Ralene Muskrat B, MD   10 mg at 10/23/21 0948   insulin aspart (novoLOG) injection 0-5 Units  0-5 Units Subcutaneous QHS Enzo Bi, MD   3 Units at 10/22/21 2228   insulin aspart (novoLOG) injection 0-9 Units  0-9 Units Subcutaneous TID WC Enzo Bi, MD   7 Units at 10/23/21 0946   insulin glargine-yfgn (SEMGLEE) injection 12 Units  12 Units Subcutaneous Daily Enzo Bi, MD   12 Units at 10/23/21 0947   labetalol (NORMODYNE) injection 10 mg  10 mg Intravenous Azzie Roup, MD   10 mg at 10/23/21 0525   ondansetron (ZOFRAN) tablet 4 mg  4 mg Oral Q6H PRN Cox, Amy N, DO       Or   ondansetron (ZOFRAN) injection 4 mg  4 mg Intravenous Q6H PRN Cox, Amy N, DO       pantoprazole (PROTONIX) injection 40 mg  40 mg Intravenous Q24H Sreenath, Sudheer B, MD   40 mg at 10/22/21 1501   potassium chloride 10 mEq in 100 mL IVPB  10 mEq Intravenous Q1 Hr x 4 Enzo Bi, MD 100 mL/hr at 10/23/21 1124 10 mEq at 10/23/21 1124     Discharge Medications: Please see discharge summary for a list of discharge medications.  Relevant Imaging Results:  Relevant Lab Results:   Additional Information SS #: [id number]  [place], LCSW

## 2021-10-23 NOTE — Progress Notes (Signed)
[facility]  ROUNDING NOTE   Subjective:   [name] was admitted to [facility] on 10/27/2021 for Facial swelling [R22.0] Angioedema, initial encounter [T78.3XXA] Fever, unspecified fever cause [R50.9] Angioedema [T78.3XXA]  Patient is known to our clinic and receives PD management at [facility], supervised by Dr Juleen China.   Patient resting comfortably in bed without any distress. Family at bedside. Tolerating nectar thick liquid per speech. Respiratory status stable.   Objective:  Vital signs in last 24 hours:  Temp:  [97.5 F (36.4 C)-99 F (37.2 C)] 98.2 F (36.8 C) (02/19 0928) Pulse Rate:  [65-82] 70 (02/19 0928) Resp:  [16-20] 18 (02/19 0928) BP: (89-183)/(54-85) 183/85 (02/19 0928) SpO2:  [96 %-100 %] 99 % (02/19 0928) Weight:  [59.3 kg-60.5 kg] 59.3 kg (02/19 0553)  Weight change: 3.3 kg Filed Weights   10/22/21 0523 10/22/21 1956 10/23/21 0553  Weight: 64.1 kg 60.5 kg 59.3 kg    Intake/Output: I/O last 3 completed shifts: In: 10215.4 [I.V.:215.4; Other:10000] Out: 9745 [Other:9745]   Intake/Output this shift:  Total I/O In: 95093 [P.O.:240; Other:10000] Out: 9920 [Other:9920]  Physical Exam: General: NAD  Head: Facial/ tongue swelling appears improved   Eyes: Anicteric  Lungs:  Clear to auscultation, normal effort  Heart: Regular rate and rhythm  Abdomen:  Soft, nontender  Extremities:  + Dependent peripheral edema.  Neurologic: Alert and able to have a simple conversation.  Skin: No lesions, generalized ecchymosis   Access: PD catheter    Basic Metabolic Panel: Recent Labs  Lab 10/17/21 0503 10/19/21 0458 10/20/21 0440 10/21/21 0424 10/22/21 0503 10/23/21 0445  NA 132* 132* 132* 133* 134* 132*  K 3.6 3.2* 3.3* 3.5 3.4* 3.1*  CL 96* 96* 95* 98 97* 97*  CO2 23 25 26 26 25 25   GLUCOSE 327* 279* 236* 304* 351* 364*  BUN 54* 51* 59* 61* 64* 69*  CREATININE 6.80* 5.55* 5.30* 5.13* 4.91* 4.93*  CALCIUM 7.2* 7.2* 7.8* 7.7*  7.7* 7.6*  MG  --  1.6* 2.3 2.1 2.0 1.9  PHOS 5.2*  --   --   --   --   --      Liver Function Tests: Recent Labs  Lab 10/09/2021 1759  AST 26  ALT 10  ALKPHOS 59  BILITOT 0.5  PROT 5.9*  ALBUMIN 1.9*    No results for input(s): LIPASE, AMYLASE in the last 168 hours. No results for input(s): AMMONIA in the last 168 hours.  CBC: Recent Labs  Lab 10/24/2021 1621 10/17/21 0503 10/20/21 0440 10/21/21 0424 10/22/21 0503 10/23/21 0445  WBC 4.0 5.7 16.4* 13.6* 13.2* 14.9*  NEUTROABS 3.4 5.4  --   --   --   --   HGB 9.9* 8.4* 8.9* 8.0* 7.8* 7.4*  HCT 32.2* 26.7* 27.6* 25.2* 24.9* 23.9*  MCV 90.4 88.7 84.9 85.4 87.1 86.9  PLT 310 266 263 241 231 205     Cardiac Enzymes: No results for input(s): CKTOTAL, CKMB, CKMBINDEX, TROPONINI in the last 168 hours.  BNP: Invalid input(s): POCBNP  CBG: Recent Labs  Lab 10/22/21 1231 10/22/21 1615 10/22/21 2203 10/23/21 0829 10/23/21 1207  GLUCAP 299* 290* 268* 320* 357*     Microbiology: Results for orders placed or performed during the hospital encounter of 10/05/2021  Resp Panel by RT-PCR (Flu A&B, Covid) Nasopharyngeal Swab     Status: None   Collection Time: 10/06/2021  5:18 PM   Specimen: Nasopharyngeal Swab; Nasopharyngeal(NP) swabs in vial transport medium  Result  Value Ref Range Status   SARS Coronavirus 2 by RT PCR NEGATIVE NEGATIVE Final    Comment: (NOTE) SARS-CoV-2 target nucleic acids are NOT DETECTED.  The SARS-CoV-2 RNA is generally detectable in upper respiratory specimens during the acute phase of infection. The lowest concentration of SARS-CoV-2 viral copies this assay can detect is 138 copies/mL. A negative result does not preclude SARS-Cov-2 infection and should not be used as the sole basis for treatment or other patient management decisions. A negative result may occur with  improper specimen collection/handling, submission of specimen other than nasopharyngeal swab, presence of viral mutation(s)  within the areas targeted by this assay, and inadequate number of viral copies(<138 copies/mL). A negative result must be combined with clinical observations, patient history, and epidemiological information. The expected result is Negative.  Fact Sheet for Patients:  EntrepreneurPulse.com.au  Fact Sheet for Healthcare Providers:  IncredibleEmployment.be  This test is no t yet approved or cleared by the [place] FDA and  has been authorized for detection and/or diagnosis of SARS-CoV-2 by FDA under an Emergency Use Authorization (EUA). This EUA will remain  in effect (meaning this test can be used) for the duration of the COVID-19 declaration under Section 564(b)(1) of the Act, 21 U.S.C.section 360bbb-3(b)(1), unless the authorization is terminated  or revoked sooner.       Influenza A by PCR NEGATIVE NEGATIVE Final   Influenza B by PCR NEGATIVE NEGATIVE Final    Comment: (NOTE) The Xpert Xpress SARS-CoV-2/FLU/RSV plus assay is intended as an aid in the diagnosis of influenza from Nasopharyngeal swab specimens and should not be used as a sole basis for treatment. Nasal washings and aspirates are unacceptable for Xpert Xpress SARS-CoV-2/FLU/RSV testing.  Fact Sheet for Patients: EntrepreneurPulse.com.au  Fact Sheet for Healthcare Providers: IncredibleEmployment.be  This test is not yet approved or cleared by the [place] FDA and has been authorized for detection and/or diagnosis of SARS-CoV-2 by FDA under an Emergency Use Authorization (EUA). This EUA will remain in effect (meaning this test can be used) for the duration of the COVID-19 declaration under Section 564(b)(1) of the Act, 21 U.S.C. section 360bbb-3(b)(1), unless the authorization is terminated or revoked.  Performed at [facility], [address] [place], [place] [address]   Body fluid culture w Gram Stain     Status:  None   Collection Time: 10/05/2021 11:40 PM   Specimen: Peritoneal Washings; Body Fluid  Result Value Ref Range Status   Specimen Description   Final    PERITONEAL DIALYSIS Performed at [facility], [address] [place], [place] [address]    Special Requests   Final    NONE Performed at [facility], [address] [place], [place] [address]    Gram Stain   Final    NO SQUAMOUS EPITHELIAL CELLS SEEN NO WBC SEEN NO ORGANISMS SEEN    Culture   Final    NO GROWTH 3 DAYS Performed at [facility], [place] [address] [place], [place] [address]    Report Status 10/20/2021 FINAL  Final  MRSA Next Gen by PCR, Nasal     Status: None   Collection Time: 10/18/21  1:40 PM   Specimen: Nasal Mucosa; Nasal Swab  Result Value Ref Range Status   MRSA by PCR Next Gen NOT DETECTED NOT DETECTED Final    Comment: (NOTE) The GeneXpert MRSA Assay (FDA approved for NASAL specimens only), is one component of a comprehensive MRSA colonization surveillance program. It  is not intended to diagnose MRSA infection nor to guide or monitor treatment for MRSA infections. Test performance is not FDA approved in patients less than 45 years old. Performed at [facility], [address] [place], [place] [address]     Coagulation Studies: No results for input(s): LABPROT, INR in the last 72 hours.   Urinalysis: No results for input(s): COLORURINE, LABSPEC, PHURINE, GLUCOSEU, HGBUR, BILIRUBINUR, KETONESUR, PROTEINUR, UROBILINOGEN, NITRITE, LEUKOCYTESUR in the last 72 hours.  Invalid input(s): APPERANCEUR    Imaging: US Venous Img Upper Uni Right(DVT)  Result Date: 10/22/2021 CLINICAL DATA:  Right arm swelling EXAM: RIGHT UPPER EXTREMITY VENOUS DOPPLER ULTRASOUND TECHNIQUE: Gray-scale sonography with graded compression, as well as color Doppler and duplex ultrasound were performed to evaluate the upper extremity deep venous system from the level of the subclavian vein  and including the jugular, axillary, basilic, radial, ulnar and upper cephalic vein. Spectral Doppler was utilized to evaluate flow at rest and with distal augmentation maneuvers. COMPARISON:  None. FINDINGS: Contralateral Subclavian Vein: Respiratory phasicity is normal and symmetric with the symptomatic side. No evidence of thrombus. Normal compressibility. Internal Jugular Vein: No evidence of thrombus. Normal compressibility, respiratory phasicity and response to augmentation. Subclavian Vein: No evidence of thrombus. Normal compressibility, respiratory phasicity and response to augmentation. Axillary Vein: No evidence of thrombus. Normal compressibility, respiratory phasicity and response to augmentation. Cephalic Vein: No evidence of thrombus. Normal compressibility, respiratory phasicity and response to augmentation. Basilic Vein: No evidence of thrombus. Normal compressibility, respiratory phasicity and response to augmentation. Brachial Veins: No evidence of thrombus. Normal compressibility, respiratory phasicity and response to augmentation. Radial Veins: No evidence of thrombus. Normal compressibility, respiratory phasicity and response to augmentation. Ulnar Veins: No evidence of thrombus. Normal compressibility, respiratory phasicity and response to augmentation. Venous Reflux:  None visualized. Other Findings:  None visualized. IMPRESSION: No evidence of DVT within the right upper extremity. Electronically Signed   By: Inez Catalina M.D.   On: 10/22/2021 03:39     Medications:    sodium chloride 30 mL/hr at 10/23/21 0623   dialysis solution 1.5% low-MG/low-CA     potassium chloride 10 mEq (10/23/21 1210)    chlorhexidine  15 mL Mouth/Throat Daily   Chlorhexidine Gluconate Cloth  6 each Topical Daily   cloNIDine  0.2 mg Transdermal Weekly   darbepoetin (ARANESP) injection - NON-DIALYSIS  100 mcg Subcutaneous Q Thu-1800   dexamethasone (DECADRON) injection  6 mg Intravenous Q6H   feeding  supplement (NEPRO CARB STEADY)  237 mL Oral TID   gentamicin cream  1 application Topical Daily   heparin injection (subcutaneous)  5,000 Units Subcutaneous Q8H   insulin aspart  0-5 Units Subcutaneous QHS   insulin aspart  0-9 Units Subcutaneous TID WC   insulin glargine-yfgn  12 Units Subcutaneous Daily   labetalol  10 mg Intravenous Q8H   pantoprazole (PROTONIX) IV  40 mg Intravenous Q24H   acetaminophen **OR** acetaminophen, benzocaine, dianeal solution for CAPD/CCPD with heparin, hydrALAZINE, ondansetron **OR** ondansetron (ZOFRAN) IV  Assessment/ Plan:  [name] is a 81 y.o. white female with end stage renal disease on peritoneal dialysis, diabetes mellitus type II insulin dependent, hypertension, and hyperlipidemia who is admitted to [facility] on 10/26/2021 for Facial swelling [R22.0] Angioedema, initial encounter [T78.3XXA] Fever, unspecified fever cause [R50.9] Angioedema [T78.3XXA]  [facility] Peritoneal [facility] 59kg CCPD 5 exchanges 2 liter fills 9 hours  End Stage Renal Disease:  Will hold PD tonight since patient has low BP.  Will continue to trend BP. Will monitor labs. Monitor fluid balance.   02/18 0701 - 02/19 0700 In: 10215.4 [I.V.:215.4] Out: 9745    Hypertension:   ACE inhibitor and ARB on hold. BP still low, Planning to hold PD tonight.  Anemia of chronic kidney disease:  Mircera as outpatient given 09/27/2021. Latest HgB 7.4   Lab Results  Component Value Date   HGB 7.4 (L) 10/23/2021  Aranesp ordered weekly (Thursday)  4.  Angioedema, believed due to prescribed ACE-i, currently held.   Transferred ourt of ICU on 10/20/21.Continue with dexamethasone. PO intake started with necator thick liquid. as per speech. Continue benzocaine for oral discomfort.   5.  Hypokalemia,  Pt's K level today 3.1, KCL 40 meq IV today.  F/U labs in am    LOS: [place] Acie Custis 2/19/202312:42 PM

## 2021-10-23 NOTE — NC FL2 (Addendum)
[place] LEVEL OF CARE SCREENING TOOL     IDENTIFICATION  Patient Name: Catherine Burke Birthdate: 11/27/1940 Sex: female Admission Date (Current Location): 10/12/2021  [facility] and [place] Number:  Engineering geologist and Address:  [facility], [address] [place], [place] [address]      Provider Number: [id number]  Attending Physician Name and Address:  Enzo Bi, MD  Relative Name and Phone Number:  Lamont Snowball)   [phone] [facility])    Current Level of Care: Hospital Recommended Level of Care: [place] Prior Approval Number:    Date Approved/Denied:   PASRR Number: [id number]  Discharge Plan:      Current Diagnoses: Patient Active Problem List   Diagnosis Date Noted   Malnutrition of moderate degree 10/18/2021   Angioedema 10/18/2021   Angioedema, initial encounter 10/24/2021   Facial cellulitis 10/30/2021   Abdominal pain 10/13/2021   PD catheter dysfunction, initial encounter ([place])    Cellulitis 07/20/2021   Abdominal wall cellulitis 07/20/2021   B12 deficiency 04/04/2021   Symptomatic anemia 07/22/2020   Acute on chronic anemia 07/21/2020   ESRD (end stage renal disease) ([place]) 07/21/2020   Peritoneal dialysis status ([place]) 07/21/2020   Chronic kidney disease (CKD), stage IV (severe) ([place]) 02/08/2018   Proteinuria 10/10/2017   Chronic kidney disease (CKD), stage III (moderate) ([place]) 02/24/2015   Kidney stones    Osteoporosis    GERD (gastroesophageal reflux disease)    Diabetes mellitus ([place]) 01/17/2007   Hyperlipidemia 01/17/2007   Essential hypertension 01/17/2007   OSTEOPENIA 01/17/2007   HYSTERECTOMY, PARTIAL, HX OF 01/17/2007    Orientation RESPIRATION BLADDER Height & Weight     Self, Time, Situation, Place  Normal External catheter Weight: 130 lb 11.7 oz (59.3 kg) Height:  5\' 4"  (162.6 cm)  BEHAVIORAL SYMPTOMS/MOOD NEUROLOGICAL BOWEL NUTRITION STATUS      Continent Diet  (clear liquids; nectar thick)  AMBULATORY STATUS COMMUNICATION OF NEEDS Skin   Limited Assist Verbally Other (Comment) (pressure injury - sacrum)                       Personal Care Assistance Level of Assistance  Bathing, Feeding, Dressing Bathing Assistance: Limited assistance Feeding assistance: Limited assistance Dressing Assistance: Limited assistance     Functional Limitations Info             SPECIAL CARE FACTORS FREQUENCY  PT (By licensed PT), OT (By licensed OT)     PT Frequency: 5 times per week OT Frequency: 5 times per week            Contractures      Additional Factors Info  Code Status, Allergies Code Status Info: partial Allergies Info: Ace Inhibitors, Levofloxacin, Penicillins, Other, Penicillin G, Sulfa Antibiotics           Current Medications (10/23/2021):  This is the current hospital active medication list Current Facility-Administered Medications  Medication Dose Route Frequency Provider Last Rate Last Admin   0.9 %  sodium chloride infusion   Intravenous Continuous Enzo Bi, MD 30 mL/hr at 10/23/21 0937 Rate Change at 10/23/21 6301   acetaminophen (TYLENOL) tablet 650 mg  650 mg Oral Q6H PRN Cox, Amy N, DO       Or   acetaminophen (TYLENOL) suppository 650 mg  650 mg Rectal Q6H PRN Cox, Amy N, DO       benzocaine (ORAJEL) 10 % mucosal gel   Mouth/Throat TID PRN Colon Flattery,  NP   Given at 10/21/21 1333   chlorhexidine (PERIDEX) 0.12 % solution 15 mL  15 mL Mouth/Throat Daily Enzo Bi, MD   15 mL at 10/23/21 0948   Chlorhexidine Gluconate Cloth 2 % PADS 6 each  6 each Topical Daily Ralene Muskrat B, MD   6 each at 10/23/21 0948   cloNIDine (CATAPRES - Dosed in mg/24 hr) patch 0.2 mg  0.2 mg Transdermal Weekly Priscella Mann, Sudheer B, MD   0.2 mg at 10/18/21 1809   Darbepoetin Alfa (ARANESP) injection 100 mcg  100 mcg Subcutaneous Q Thu-1800 Murlean Iba, MD   100 mcg at 10/20/21 2311   dexamethasone (DECADRON) injection 6 mg  6 mg  Intravenous Q6H Lockie Mola B, RPH   6 mg at 10/23/21 0948   dialysis solution 1.5% low-MG/low-CA dianeal solution   Intraperitoneal Q24H Kolluru, Sarath, MD   6,000 mL at 10/21/21 2020   feeding supplement (NEPRO CARB STEADY) liquid 237 mL  237 mL Oral TID Enzo Bi, MD   237 mL at 10/23/21 0948   gentamicin cream (GARAMYCIN) 0.1 % 1 application  1 application Topical Daily Kolluru, Sarath, MD   1 application at 62/83/15 1720   heparin 3,000 Units in dialysis solution 1.5% low-MG/low-CA 6,000 mL dialysis solution   Peritoneal Dialysis PRN Pearla Dubonnet, RPH   Given at 10/21/21 2021   heparin injection 5,000 Units  5,000 Units Subcutaneous Q8H Enzo Bi, MD   5,000 Units at 10/23/21 0525   hydrALAZINE (APRESOLINE) injection 10 mg  10 mg Intravenous Q4H PRN Ralene Muskrat B, MD   10 mg at 10/23/21 0948   insulin aspart (novoLOG) injection 0-5 Units  0-5 Units Subcutaneous QHS Enzo Bi, MD   3 Units at 10/22/21 2228   insulin aspart (novoLOG) injection 0-9 Units  0-9 Units Subcutaneous TID WC Enzo Bi, MD   7 Units at 10/23/21 0946   insulin glargine-yfgn (SEMGLEE) injection 12 Units  12 Units Subcutaneous Daily Enzo Bi, MD   12 Units at 10/23/21 0947   labetalol (NORMODYNE) injection 10 mg  10 mg Intravenous Azzie Roup, MD   10 mg at 10/23/21 0525   ondansetron (ZOFRAN) tablet 4 mg  4 mg Oral Q6H PRN Cox, Amy N, DO       Or   ondansetron (ZOFRAN) injection 4 mg  4 mg Intravenous Q6H PRN Cox, Amy N, DO       pantoprazole (PROTONIX) injection 40 mg  40 mg Intravenous Q24H Sreenath, Sudheer B, MD   40 mg at 10/22/21 1501   potassium chloride 10 mEq in 100 mL IVPB  10 mEq Intravenous Q1 Hr x 4 Enzo Bi, MD 100 mL/hr at 10/23/21 1124 10 mEq at 10/23/21 1124     Discharge Medications: Please see discharge summary for a list of discharge medications.  Relevant Imaging Results:  Relevant Lab Results:   Additional Information SS #: [id number]      PERITONEAL DIALYSIS PATIENT.    Xsavier Seeley E Axavier Pressley, LCSW

## 2021-10-23 NOTE — Progress Notes (Signed)
[facility]  ROUNDING NOTE   Subjective:   [name] was admitted to [facility] on 10/10/2021 for Facial swelling [R22.0] Angioedema, initial encounter [T78.3XXA] Fever, unspecified fever cause [R50.9] Angioedema [T78.3XXA]  Patient is known to our clinic and receives PD management at [facility], supervised by Dr Juleen China.   Patient resting comfortably in bed.Complains of mouth soreness and requesting for PO intake. Speech therapy evaluation is pending.   Reports respiratory status stable.   Objective:  Vital signs in last 24 hours:  Temp:  [97.5 F (36.4 C)-99 F (37.2 C)] 98.2 F (36.8 C) (02/19 0928) Pulse Rate:  [65-82] 70 (02/19 0928) Resp:  [16-20] 18 (02/19 0928) BP: (89-183)/(54-85) 183/85 (02/19 0928) SpO2:  [96 %-100 %] 99 % (02/19 0928) Weight:  [59.3 kg-60.5 kg] 59.3 kg (02/19 0553)  Weight change: 3.3 kg Filed Weights   10/22/21 0523 10/22/21 1956 10/23/21 0553  Weight: 64.1 kg 60.5 kg 59.3 kg    Intake/Output: I/O last 3 completed shifts: In: 10215.4 [I.V.:215.4; Other:10000] Out: 9745 [Other:9745]   Intake/Output this shift:  Total I/O In: 09323 [P.O.:240; Other:10000] Out: 9920 [Other:9920]  Physical Exam: General: NAD  Head: Facial swelling appears improved compared to yesterday, tongue is still swollen  Eyes: Anicteric  Lungs:  Clear to auscultation, normal effort  Heart: Regular rate and rhythm  Abdomen:  Soft, nontender  Extremities:  + Dependent peripheral edema.  Neurologic: Alert and able to have a simple conversation.  Skin: No lesions, generalized ecchymosis   Access: PD catheter    Basic Metabolic Panel: Recent Labs  Lab 10/17/21 0503 10/19/21 0458 10/20/21 0440 10/21/21 0424 10/22/21 0503 10/23/21 0445  NA 132* 132* 132* 133* 134* 132*  K 3.6 3.2* 3.3* 3.5 3.4* 3.1*  CL 96* 96* 95* 98 97* 97*  CO2 23 25 26 26 25 25   GLUCOSE 327* 279* 236* 304* 351* 364*  BUN 54* 51* 59* 61* 64* 69*  CREATININE  6.80* 5.55* 5.30* 5.13* 4.91* 4.93*  CALCIUM 7.2* 7.2* 7.8* 7.7* 7.7* 7.6*  MG  --  1.6* 2.3 2.1 2.0 1.9  PHOS 5.2*  --   --   --   --   --      Liver Function Tests: Recent Labs  Lab 10/18/2021 1759  AST 26  ALT 10  ALKPHOS 59  BILITOT 0.5  PROT 5.9*  ALBUMIN 1.9*    No results for input(s): LIPASE, AMYLASE in the last 168 hours. No results for input(s): AMMONIA in the last 168 hours.  CBC: Recent Labs  Lab 10/05/2021 1621 10/17/21 0503 10/20/21 0440 10/21/21 0424 10/22/21 0503 10/23/21 0445  WBC 4.0 5.7 16.4* 13.6* 13.2* 14.9*  NEUTROABS 3.4 5.4  --   --   --   --   HGB 9.9* 8.4* 8.9* 8.0* 7.8* 7.4*  HCT 32.2* 26.7* 27.6* 25.2* 24.9* 23.9*  MCV 90.4 88.7 84.9 85.4 87.1 86.9  PLT 310 266 263 241 231 205     Cardiac Enzymes: No results for input(s): CKTOTAL, CKMB, CKMBINDEX, TROPONINI in the last 168 hours.  BNP: Invalid input(s): POCBNP  CBG: Recent Labs  Lab 10/22/21 1000 10/22/21 1231 10/22/21 1615 10/22/21 2203 10/23/21 0829  GLUCAP 264* 299* 290* 268* 320*     Microbiology: Results for orders placed or performed during the hospital encounter of 10/10/2021  Resp Panel by RT-PCR (Flu A&B, Covid) Nasopharyngeal Swab     Status: None   Collection Time: 10/15/2021  5:18 PM   Specimen: Nasopharyngeal  Swab; Nasopharyngeal(NP) swabs in vial transport medium  Result Value Ref Range Status   SARS Coronavirus 2 by RT PCR NEGATIVE NEGATIVE Final    Comment: (NOTE) SARS-CoV-2 target nucleic acids are NOT DETECTED.  The SARS-CoV-2 RNA is generally detectable in upper respiratory specimens during the acute phase of infection. The lowest concentration of SARS-CoV-2 viral copies this assay can detect is 138 copies/mL. A negative result does not preclude SARS-Cov-2 infection and should not be used as the sole basis for treatment or other patient management decisions. A negative result may occur with  improper specimen collection/handling, submission of specimen  other than nasopharyngeal swab, presence of viral mutation(s) within the areas targeted by this assay, and inadequate number of viral copies(<138 copies/mL). A negative result must be combined with clinical observations, patient history, and epidemiological information. The expected result is Negative.  Fact Sheet for Patients:  EntrepreneurPulse.com.au  Fact Sheet for Healthcare Providers:  IncredibleEmployment.be  This test is no t yet approved or cleared by the [place] FDA and  has been authorized for detection and/or diagnosis of SARS-CoV-2 by FDA under an Emergency Use Authorization (EUA). This EUA will remain  in effect (meaning this test can be used) for the duration of the COVID-19 declaration under Section 564(b)(1) of the Act, 21 U.S.C.section 360bbb-3(b)(1), unless the authorization is terminated  or revoked sooner.       Influenza A by PCR NEGATIVE NEGATIVE Final   Influenza B by PCR NEGATIVE NEGATIVE Final    Comment: (NOTE) The Xpert Xpress SARS-CoV-2/FLU/RSV plus assay is intended as an aid in the diagnosis of influenza from Nasopharyngeal swab specimens and should not be used as a sole basis for treatment. Nasal washings and aspirates are unacceptable for Xpert Xpress SARS-CoV-2/FLU/RSV testing.  Fact Sheet for Patients: EntrepreneurPulse.com.au  Fact Sheet for Healthcare Providers: IncredibleEmployment.be  This test is not yet approved or cleared by the [place] FDA and has been authorized for detection and/or diagnosis of SARS-CoV-2 by FDA under an Emergency Use Authorization (EUA). This EUA will remain in effect (meaning this test can be used) for the duration of the COVID-19 declaration under Section 564(b)(1) of the Act, 21 U.S.C. section 360bbb-3(b)(1), unless the authorization is terminated or revoked.  Performed at [facility], [address]  [place], [place] [address]   Body fluid culture w Gram Stain     Status: None   Collection Time: 10/13/2021 11:40 PM   Specimen: Peritoneal Washings; Body Fluid  Result Value Ref Range Status   Specimen Description   Final    PERITONEAL DIALYSIS Performed at [facility], [address] [place], [place] [address]    Special Requests   Final    NONE Performed at [facility], [address] [place], [place] [address]    Gram Stain   Final    NO SQUAMOUS EPITHELIAL CELLS SEEN NO WBC SEEN NO ORGANISMS SEEN    Culture   Final    NO GROWTH 3 DAYS Performed at [facility], [place] [address], [place], [place] [address]    Report Status 10/20/2021 FINAL  Final  MRSA Next Gen by PCR, Nasal     Status: None   Collection Time: 10/18/21  1:40 PM   Specimen: Nasal Mucosa; Nasal Swab  Result Value Ref Range Status   MRSA by PCR Next Gen NOT DETECTED NOT DETECTED Final    Comment: (NOTE) The GeneXpert MRSA Assay (FDA approved for NASAL specimens only), is one  component of a comprehensive MRSA colonization surveillance program. It is not intended to diagnose MRSA infection nor to guide or monitor treatment for MRSA infections. Test performance is not FDA approved in patients less than 53 years old. Performed at [facility], [address] [place], [place] [address]     Coagulation Studies: No results for input(s): LABPROT, INR in the last 72 hours.   Urinalysis: No results for input(s): COLORURINE, LABSPEC, PHURINE, GLUCOSEU, HGBUR, BILIRUBINUR, KETONESUR, PROTEINUR, UROBILINOGEN, NITRITE, LEUKOCYTESUR in the last 72 hours.  Invalid input(s): APPERANCEUR    Imaging: US Venous Img Upper Uni Right(DVT)  Result Date: 10/22/2021 CLINICAL DATA:  Right arm swelling EXAM: RIGHT UPPER EXTREMITY VENOUS DOPPLER ULTRASOUND TECHNIQUE: Gray-scale sonography with graded compression, as well as color Doppler and duplex ultrasound were performed to evaluate the  upper extremity deep venous system from the level of the subclavian vein and including the jugular, axillary, basilic, radial, ulnar and upper cephalic vein. Spectral Doppler was utilized to evaluate flow at rest and with distal augmentation maneuvers. COMPARISON:  None. FINDINGS: Contralateral Subclavian Vein: Respiratory phasicity is normal and symmetric with the symptomatic side. No evidence of thrombus. Normal compressibility. Internal Jugular Vein: No evidence of thrombus. Normal compressibility, respiratory phasicity and response to augmentation. Subclavian Vein: No evidence of thrombus. Normal compressibility, respiratory phasicity and response to augmentation. Axillary Vein: No evidence of thrombus. Normal compressibility, respiratory phasicity and response to augmentation. Cephalic Vein: No evidence of thrombus. Normal compressibility, respiratory phasicity and response to augmentation. Basilic Vein: No evidence of thrombus. Normal compressibility, respiratory phasicity and response to augmentation. Brachial Veins: No evidence of thrombus. Normal compressibility, respiratory phasicity and response to augmentation. Radial Veins: No evidence of thrombus. Normal compressibility, respiratory phasicity and response to augmentation. Ulnar Veins: No evidence of thrombus. Normal compressibility, respiratory phasicity and response to augmentation. Venous Reflux:  None visualized. Other Findings:  None visualized. IMPRESSION: No evidence of DVT within the right upper extremity. Electronically Signed   By: Inez Catalina M.D.   On: 10/22/2021 03:39     Medications:    sodium chloride 30 mL/hr at 10/23/21 2951   dialysis solution 1.5% low-MG/low-CA     potassium chloride 10 mEq (10/23/21 0949)    chlorhexidine  15 mL Mouth/Throat Daily   Chlorhexidine Gluconate Cloth  6 each Topical Daily   cloNIDine  0.2 mg Transdermal Weekly   darbepoetin (ARANESP) injection - NON-DIALYSIS  100 mcg Subcutaneous Q Thu-1800    dexamethasone (DECADRON) injection  6 mg Intravenous Q6H   feeding supplement (NEPRO CARB STEADY)  237 mL Oral TID   gentamicin cream  1 application Topical Daily   heparin injection (subcutaneous)  5,000 Units Subcutaneous Q8H   insulin aspart  0-5 Units Subcutaneous QHS   insulin aspart  0-9 Units Subcutaneous TID WC   insulin glargine-yfgn  12 Units Subcutaneous Daily   labetalol  10 mg Intravenous Q8H   pantoprazole (PROTONIX) IV  40 mg Intravenous Q24H   acetaminophen **OR** acetaminophen, benzocaine, dianeal solution for CAPD/CCPD with heparin, hydrALAZINE, ondansetron **OR** ondansetron (ZOFRAN) IV  Assessment/ Plan:  [name] is a 81 y.o. white female with end stage renal disease on peritoneal dialysis, diabetes mellitus type II insulin dependent, hypertension, and hyperlipidemia who is admitted to [facility] on 10/24/2021 for Facial swelling [R22.0] Angioedema, initial encounter [T78.3XXA] Fever, unspecified fever cause [R50.9] Angioedema [T78.3XXA]  [facility] 59kg CCPD 5 exchanges 2 liter fills 9 hours  End Stage Renal Disease:  Will continue night PD treatment with instilled heparin.  Monitoring fluid intake with IVF vs removal with PD treatments.   Hypertension:   ACE inhibitor and ARB on hold. BP stable for this patient  Anemia of chronic kidney disease:  Mircera as outpatient given 09/27/2021. Latest HgB 7.8   Lab Results  Component Value Date   HGB 7.4 (L) 10/23/2021  Aranesp ordered weekly (Thursday)  4.  Angioedema, believed due to prescribed ACE-i, currently held.   Transferred our of ICU on 10/20/21.Continue with dexamethasone. Remains NPO. PO intake as per speech. Continue benzocaine for oral discomfort.   5.  Hypokalemia,  Likely nutritional Expected to correct when she is able to eat normal diet. K today 3.4. F/U labs.     LOS: 5 Britania Shreeve P Cheyenna Pankowski 2/19/202311:08 AM

## 2021-10-23 NOTE — Progress Notes (Addendum)
PROGRESS NOTE    Catherine Burke  ERX:[id number] DOB: [birth year] DOA: 10/20/2021 PCP: Idelle Crouch, MD  108A/108A-AA   Assessment & Plan:   Principal Problem:   Angioedema, initial encounter Active Problems:   Hyperlipidemia   Essential hypertension   GERD (gastroesophageal reflux disease)   Chronic kidney disease (CKD), stage IV (severe) (HCC)   ESRD (end stage renal disease) ([place])   Facial cellulitis   Malnutrition of moderate degree   Angioedema   81 year old with medical history of end-stage renal disease on peritoneal dialysis, hypertension, insulin-dependent diabetes mellitus, who presented emergency department for chief concerns of right facial swelling.   Facial swelling thought to be due to angioedema secondary to ACE inhibitor use.  ACE inhibitor's been discontinued and added to allergy list.   2/14:  Patient had worsening of her facial and tongue swelling.  Pt transferred to [facility] and PCCM consulted on 2/14 for close monitoring.   * Angioedema --s/p -Solu-Medrol 125 mg x 1, Benadryl 25 mg x 1, Pepcid 20 mg IV x1 -EpiPen x1 --has been on IV decadron 6 mg q6h Plan: --taper IV decadron to 6 mg q8h --no need for further IV Benadryl --oral care per SLP order  Dysphagia --due to tongue swelling and poor lingual control of boluses and coordination  --Dobhoff attempted by RN on 2/17 but unsuccessful.  Discussed with dietician, no indication to start TPN, will wait for IR to place Dobhoff tube on Monday and start tube feed then if pt still isn't safe to have oral intake. --reduce MIVF to 30 ml/hr --SLP eval, cont nectar thick liquid and regular nepro with spoon  Essential hypertension Poor control over interval --home ARB d/c'ed --cont clonidine patch 0.2 mg --cont IV labetalol 10 mg q8h for now   ESRD on peritoneal dialysis (end stage renal disease) ([place]) --iPD per nephrology   Facial cellulitis Assessment & Plan Facial cellulitis felt unlikely -  DC'ed antibiotics  Anemia of CKD --Mircera as outpatient  Afib, isolated episode --noted early morning 2/15, new dx, likely related to acute illness --started on heparin gtt, since d/c'ed --currently rate controlled and back to NSR  Hypokalemia --monitor and replete with IV potassium  DM2, well controlled Hyperglycemia exacerbated by steroid use --A1c 5.7 --cont glargine 12u daily --SSI   2 cm right sphenoid wing mass, incompletely imaged but 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 Consider nonemergent contrast enhanced brain MRI for further evaluation.   DVT prophylaxis: Lovenox SQ Code Status: Limited code  Family Communication: daughter and granddaughter updated at bedside today  Level of care: Telemetry Medical Dispo:   The patient is from: home Anticipated d/c is to: SNF Anticipated d/c date is: 2-3 days Patient currently is not medically ready to d/c due to: dysphagia due to angioedema    Subjective and Interval History:  Pt reported tolerating liquid diet without issue.  Also speech much improved.   Objective: Vitals:   10/23/21 0928 10/23/21 1256 10/23/21 1619 10/23/21 1620  BP: (!) 183/85 (!) 162/65 (!) 138/49 (!) 138/49  Pulse: 70 70 68 72  Resp: 18  18 18   Temp: 98.2 F (36.8 C)  98.4 F (36.9 C) 98.4 F (36.9 C)  TempSrc: Oral  Oral Oral  SpO2: 99%  99% 99%  Weight:      Height:        Intake/Output Summary (Last 24 hours) at 10/23/2021 2000 Last data filed at 10/23/2021 1847 Gross per 24 hour  Intake 10880 ml  Output  9920 ml  Net 960 ml   Filed Weights   10/22/21 0523 10/22/21 1956 10/23/21 0553  Weight: 64.1 kg 60.5 kg 59.3 kg    Examination:   Constitutional: NAD, AAOx3, sitting in recliner HEENT: conjunctivae and lids normal, EOMI, tongue with superficial ulceration and crusting  CV: No cyanosis.   RESP: normal respiratory effort, on RA Extremities: edema in both arms, R>L SKIN: warm, dry Neuro: II - XII grossly intact.   Psych:  Normal mood and affect.  Appropriate judgement and reason   Data Reviewed: I have personally reviewed following labs and imaging studies  CBC: Recent Labs  Lab 10/17/21 0503 10/20/21 0440 10/21/21 0424 10/22/21 0503 10/23/21 0445  WBC 5.7 16.4* 13.6* 13.2* 14.9*  NEUTROABS 5.4  --   --   --   --   HGB 8.4* 8.9* 8.0* 7.8* 7.4*  HCT 26.7* 27.6* 25.2* 24.9* 23.9*  MCV 88.7 84.9 85.4 87.1 86.9  PLT 266 263 241 231 782   Basic Metabolic Panel: Recent Labs  Lab 10/17/21 0503 10/19/21 0458 10/20/21 0440 10/21/21 0424 10/22/21 0503 10/23/21 0445  NA 132* 132* 132* 133* 134* 132*  K 3.6 3.2* 3.3* 3.5 3.4* 3.1*  CL 96* 96* 95* 98 97* 97*  CO2 23 25 26 26 25 25   GLUCOSE 327* 279* 236* 304* 351* 364*  BUN 54* 51* 59* 61* 64* 69*  CREATININE 6.80* 5.55* 5.30* 5.13* 4.91* 4.93*  CALCIUM 7.2* 7.2* 7.8* 7.7* 7.7* 7.6*  MG  --  1.6* 2.3 2.1 2.0 1.9  PHOS 5.2*  --   --   --   --   --    GFR: Estimated Creatinine Clearance: 7.6 mL/min (A) (by C-G formula based on SCr of 4.93 mg/dL (H)). Liver Function Tests: No results for input(s): AST, ALT, ALKPHOS, BILITOT, PROT, ALBUMIN in the last 168 hours.  No results for input(s): LIPASE, AMYLASE in the last 168 hours. No results for input(s): AMMONIA in the last 168 hours. Coagulation Profile: No results for input(s): INR, PROTIME in the last 168 hours.  Cardiac Enzymes: No results for input(s): CKTOTAL, CKMB, CKMBINDEX, TROPONINI in the last 168 hours. BNP (last 3 results) No results for input(s): PROBNP in the last 8760 hours. HbA1C: No results for input(s): HGBA1C in the last 72 hours.  CBG: Recent Labs  Lab 10/22/21 1615 10/22/21 2203 10/23/21 0829 10/23/21 1207 10/23/21 1622  GLUCAP 290* 268* 320* 357* 161*   Lipid Profile: No results for input(s): CHOL, HDL, LDLCALC, TRIG, CHOLHDL, LDLDIRECT in the last 72 hours. Thyroid Function Tests: No results for input(s): TSH, T4TOTAL, FREET4, T3FREE, THYROIDAB in the last 72  hours.  Anemia Panel: No results for input(s): VITAMINB12, FOLATE, FERRITIN, TIBC, IRON, RETICCTPCT in the last 72 hours. Sepsis Labs: Recent Labs  Lab 10/27/2021 2158  PROCALCITON 6.86  LATICACIDVEN 2.2*    Recent Results (from the past 240 hour(s))  Culture, blood (Routine x 2)     Status: None   Collection Time: 10/15/21 10:00 PM   Specimen: BLOOD LEFT WRIST  Result Value Ref Range Status   Specimen Description   Final    BLOOD LEFT WRIST BOTTLES DRAWN AEROBIC AND ANAEROBIC   Special Requests Blood Culture adequate volume  Final   Culture   Final    NO GROWTH 5 DAYS Performed at [facility], [address], [place], [place] [address]    Report Status 10/20/2021 FINAL  Final  Culture, blood (Routine x 2)     Status:  None   Collection Time: 10/15/21 10:44 PM   Specimen: BLOOD RIGHT ARM  Result Value Ref Range Status   Specimen Description BLOOD RIGHT ARM  Final   Special Requests   Final    BOTTLES DRAWN AEROBIC AND ANAEROBIC Blood Culture adequate volume   Culture   Final    NO GROWTH 5 DAYS Performed at [facility], [address] [place], [place] [address]    Report Status 10/20/2021 FINAL  Final  Resp Panel by RT-PCR (Flu A&B, Covid) Nasopharyngeal Swab     Status: None   Collection Time: 10/10/2021  5:18 PM   Specimen: Nasopharyngeal Swab; Nasopharyngeal(NP) swabs in vial transport medium  Result Value Ref Range Status   SARS Coronavirus 2 by RT PCR NEGATIVE NEGATIVE Final    Comment: (NOTE) SARS-CoV-2 target nucleic acids are NOT DETECTED.  The SARS-CoV-2 RNA is generally detectable in upper respiratory specimens during the acute phase of infection. The lowest concentration of SARS-CoV-2 viral copies this assay can detect is 138 copies/mL. A negative result does not preclude SARS-Cov-2 infection and should not be used as the sole basis for treatment or other patient management decisions. A negative result may occur with  improper specimen collection/handling,  submission of specimen other than nasopharyngeal swab, presence of viral mutation(s) within the areas targeted by this assay, and inadequate number of viral copies(<138 copies/mL). A negative result must be combined with clinical observations, patient history, and epidemiological information. The expected result is Negative.  Fact Sheet for Patients:  EntrepreneurPulse.com.au  Fact Sheet for Healthcare Providers:  IncredibleEmployment.be  This test is no t yet approved or cleared by the [place] FDA and  has been authorized for detection and/or diagnosis of SARS-CoV-2 by FDA under an Emergency Use Authorization (EUA). This EUA will remain  in effect (meaning this test can be used) for the duration of the COVID-19 declaration under Section 564(b)(1) of the Act, 21 U.S.C.section 360bbb-3(b)(1), unless the authorization is terminated  or revoked sooner.       Influenza A by PCR NEGATIVE NEGATIVE Final   Influenza B by PCR NEGATIVE NEGATIVE Final    Comment: (NOTE) The Xpert Xpress SARS-CoV-2/FLU/RSV plus assay is intended as an aid in the diagnosis of influenza from Nasopharyngeal swab specimens and should not be used as a sole basis for treatment. Nasal washings and aspirates are unacceptable for Xpert Xpress SARS-CoV-2/FLU/RSV testing.  Fact Sheet for Patients: EntrepreneurPulse.com.au  Fact Sheet for Healthcare Providers: IncredibleEmployment.be  This test is not yet approved or cleared by the [place] FDA and has been authorized for detection and/or diagnosis of SARS-CoV-2 by FDA under an Emergency Use Authorization (EUA). This EUA will remain in effect (meaning this test can be used) for the duration of the COVID-19 declaration under Section 564(b)(1) of the Act, 21 U.S.C. section 360bbb-3(b)(1), unless the authorization is terminated or revoked.  Performed at [facility], [address] [place], [place] [address]   Body fluid culture w Gram Stain     Status: None   Collection Time: 10/10/2021 11:40 PM   Specimen: Peritoneal Washings; Body Fluid  Result Value Ref Range Status   Specimen Description   Final    PERITONEAL DIALYSIS Performed at [facility], [address], [place], [place] [address]    Special Requests   Final    NONE Performed at [facility], [address] [place], [place] [address]    Gram Stain   Final    NO  SQUAMOUS EPITHELIAL CELLS SEEN NO WBC SEEN NO ORGANISMS SEEN    Culture   Final    NO GROWTH 3 DAYS Performed at [facility], [place] [address], [place], [place] [address]    Report Status 10/20/2021 FINAL  Final  MRSA Next Gen by PCR, Nasal     Status: None   Collection Time: 10/18/21  1:40 PM   Specimen: Nasal Mucosa; Nasal Swab  Result Value Ref Range Status   MRSA by PCR Next Gen NOT DETECTED NOT DETECTED Final    Comment: (NOTE) The GeneXpert MRSA Assay (FDA approved for NASAL specimens only), is one component of a comprehensive MRSA colonization surveillance program. It is not intended to diagnose MRSA infection nor to guide or monitor treatment for MRSA infections. Test performance is not FDA approved in patients less than 20 years old. Performed at [facility], [address], [place], [place] [address]       Radiology Studies: US Venous Img Upper Uni Right(DVT)  Result Date: 10/22/2021 CLINICAL DATA:  Right arm swelling EXAM: RIGHT UPPER EXTREMITY VENOUS DOPPLER ULTRASOUND TECHNIQUE: Gray-scale sonography with graded compression, as well as color Doppler and duplex ultrasound were performed to evaluate the upper extremity deep venous system from the level of the subclavian vein and including the jugular, axillary, basilic, radial, ulnar and upper cephalic vein. Spectral Doppler was utilized to evaluate flow at rest and with distal augmentation maneuvers. COMPARISON:  None.  FINDINGS: Contralateral Subclavian Vein: Respiratory phasicity is normal and symmetric with the symptomatic side. No evidence of thrombus. Normal compressibility. Internal Jugular Vein: No evidence of thrombus. Normal compressibility, respiratory phasicity and response to augmentation. Subclavian Vein: No evidence of thrombus. Normal compressibility, respiratory phasicity and response to augmentation. Axillary Vein: No evidence of thrombus. Normal compressibility, respiratory phasicity and response to augmentation. Cephalic Vein: No evidence of thrombus. Normal compressibility, respiratory phasicity and response to augmentation. Basilic Vein: No evidence of thrombus. Normal compressibility, respiratory phasicity and response to augmentation. Brachial Veins: No evidence of thrombus. Normal compressibility, respiratory phasicity and response to augmentation. Radial Veins: No evidence of thrombus. Normal compressibility, respiratory phasicity and response to augmentation. Ulnar Veins: No evidence of thrombus. Normal compressibility, respiratory phasicity and response to augmentation. Venous Reflux:  None visualized. Other Findings:  None visualized. IMPRESSION: No evidence of DVT within the right upper extremity. Electronically Signed   By: Inez Catalina M.D.   On: 10/22/2021 03:39     Scheduled Meds:  chlorhexidine  15 mL Mouth/Throat Daily   Chlorhexidine Gluconate Cloth  6 each Topical Daily   cloNIDine  0.2 mg Transdermal Weekly   darbepoetin (ARANESP) injection - NON-DIALYSIS  100 mcg Subcutaneous Q Thu-1800   dexamethasone (DECADRON) injection  6 mg Intravenous Q6H   feeding supplement (NEPRO CARB STEADY)  237 mL Oral TID   gentamicin cream  1 application Topical Daily   heparin injection (subcutaneous)  5,000 Units Subcutaneous Q8H   insulin aspart  0-5 Units Subcutaneous QHS   insulin aspart  0-9 Units Subcutaneous TID WC   insulin glargine-yfgn  12 Units Subcutaneous Daily   labetalol  10 mg  Intravenous Q8H   pantoprazole (PROTONIX) IV  40 mg Intravenous Q24H   Continuous Infusions:  sodium chloride 30 mL/hr at 10/23/21 6387   dialysis solution 1.5% low-MG/low-CA       LOS: 5 days     Enzo Bi, MD [facility] If 7PM-7AM, please contact night-coverage 10/23/2021, 8:00 PM

## 2021-10-23 NOTE — TOC Progression Note (Signed)
Transition of Care [facility]) - Progression Note    Patient Details  Name: Catherine Burke MRN: [id number] Date of Birth: 1941-01-12  [facility] CM/SW [place], LCSW Phone Number: 10/23/2021, 12:18 PM  Clinical Narrative:   Met with patient and family at bedside.  Patient is agreeable to SNF, prefers [place] area. Provided ratings list from Medicare.gov site.  Provided weekday RNCM number for follow up. Will start SNF work up. Will need to make sure accepting SNFs can accommodate patient being on peritoneal dialysis.    Expected Discharge Plan: [place] Barriers to Discharge: Continued Medical Work up  Expected Discharge Plan and Services Expected Discharge Plan: [place]   Discharge Planning Services: CM Consult   Living arrangements for the past 2 months: Single Family Home                 DME Arranged: N/A DME Agency: NA                   Social Determinants of Health (SDOH) Interventions    Readmission Risk Interventions Readmission Risk Prevention Plan 10/19/2021  Transportation Screening Complete  Medication Review Press photographer) Complete  PCP or Specialist appointment within 3-5 days of discharge Complete  HRI or Home Care Consult Complete  SW Recovery Care/Counseling Consult Complete  Palliative Care Screening Not [place] Complete  Some recent data might be hidden

## 2021-10-24 ENCOUNTER — Ambulatory Visit (HOSPITAL_COMMUNITY): Payer: Medicare PPO | Admitting: Physician Assistant

## 2021-10-24 ENCOUNTER — Ambulatory Visit (HOSPITAL_COMMUNITY): Payer: Medicare PPO

## 2021-10-24 DIAGNOSIS — T783XXA Angioneurotic edema, initial encounter: Secondary | ICD-10-CM | POA: Diagnosis not present

## 2021-10-24 LAB — MAGNESIUM
Magnesium: 2 mg/dL (ref 1.7–2.4)
Magnesium: 1.9 mg/dL (ref 1.7–2.4)

## 2021-10-24 LAB — BASIC METABOLIC PANEL
Anion gap: 8 (ref 5–15)
BUN: 78 mg/dL — ABNORMAL HIGH (ref 8–23)
CO2: 25 mmol/L (ref 22–32)
Calcium: 7.7 mg/dL — ABNORMAL LOW (ref 8.9–10.3)
Chloride: 100 mmol/L (ref 98–111)
Creatinine, Ser: 5.55 mg/dL — ABNORMAL HIGH (ref 0.44–1.00)
GFR, Estimated: 7 mL/min — ABNORMAL LOW (ref >60)
Glucose, Bld: 138 mg/dL — ABNORMAL HIGH (ref 70–99)
Potassium: 3.7 mmol/L (ref 3.5–5.1)
Sodium: 133 mmol/L — ABNORMAL LOW (ref 135–145)

## 2021-10-24 LAB — GLUCOSE, CAPILLARY
Glucose-Capillary: 134 mg/dL — ABNORMAL HIGH (ref 70–99)
Glucose-Capillary: 143 mg/dL — ABNORMAL HIGH (ref 70–99)
Glucose-Capillary: 238 mg/dL — ABNORMAL HIGH (ref 70–99)
Glucose-Capillary: 241 mg/dL — ABNORMAL HIGH (ref 70–99)
Glucose-Capillary: 290 mg/dL — ABNORMAL HIGH (ref 70–99)

## 2021-10-24 LAB — CBC
HCT: 25.9 % — ABNORMAL LOW (ref 36.0–46.0)
Hemoglobin: 7.9 g/dL — ABNORMAL LOW (ref 12.0–15.0)
MCH: 26.6 pg (ref 26.0–34.0)
MCHC: 30.5 g/dL (ref 30.0–36.0)
MCV: 87.2 fL (ref 80.0–100.0)
Platelets: 231 10*3/uL (ref 150–400)
RBC: 2.97 MIL/uL — ABNORMAL LOW (ref 3.87–5.11)
RDW: 16.9 % — ABNORMAL HIGH (ref 11.5–15.5)
WBC: 24.7 10*3/uL — ABNORMAL HIGH (ref 4.0–10.5)
nRBC: 0.2 % (ref 0.0–0.2)

## 2021-10-24 LAB — PHOSPHORUS: Phosphorus: 5.8 mg/dL — ABNORMAL HIGH (ref 2.5–4.6)

## 2021-10-24 MED ORDER — VITAL HIGH PROTEIN PO LIQD: 1000.0000 mL | ORAL | Status: DC

## 2021-10-24 MED ORDER — METOPROLOL TARTRATE 25 MG/10 ML ORAL SUSPENSION
25.0000 mg | Freq: Two times a day (BID) | ORAL | Status: DC
  Administered 2021-10-24: 25 mg via ORAL
  Filled 2021-10-24 (×9): qty 10

## 2021-10-24 MED ORDER — INSULIN ASPART 100 UNIT/ML IJ SOLN
0.0000 [IU] | Freq: Three times a day (TID) | INTRAMUSCULAR | Status: DC
  Administered 2021-10-25 – 2021-10-26 (×2): 4 [IU] via SUBCUTANEOUS
  Filled 2021-10-24 (×2): qty 1

## 2021-10-24 MED ORDER — DEXAMETHASONE 1 MG/ML PO CONC
6.0000 mg | Freq: Two times a day (BID) | ORAL | Status: DC
  Administered 2021-10-24: 22:00:00 6 mg via ORAL
  Filled 2021-10-24 (×2): qty 6

## 2021-10-24 MED ORDER — OSMOLITE 1.2 CAL PO LIQD: 1000.0000 mL | ORAL | Status: DC

## 2021-10-24 MED ORDER — RENA-VITE PO TABS
1.0000 | ORAL_TABLET | Freq: Every day | ORAL | Status: DC
  Administered 2021-10-24 – 2021-10-31 (×6): 1 via ORAL
  Filled 2021-10-24 (×9): qty 1

## 2021-10-24 MED ORDER — FREE WATER
80.0000 mL | Status: DC
  Administered 2021-10-24: 80 mL

## 2021-10-24 MED ORDER — HYDRALAZINE HCL 50 MG PO TABS
25.0000 mg | ORAL_TABLET | Freq: Three times a day (TID) | ORAL | Status: DC
  Administered 2021-10-24 (×3): 25 mg via ORAL
  Filled 2021-10-24 (×3): qty 1

## 2021-10-24 NOTE — Progress Notes (Addendum)
Nutrition Follow-up  DOCUMENTATION CODES:   Non-severe (moderate) malnutrition in context of chronic illness  INTERVENTION:   -Continue Nepro Shake po TID, each supplement provides 425 kcal and 19 grams protein  -Magic cup TID with meals, each supplement provides 290 kcal and 9 grams of protein  -Renal MVI daily  NUTRITION DIAGNOSIS:   Moderate Malnutrition related to chronic illness (ESRD on PD) as evidenced by mild fat depletion, mild muscle depletion.  Ongoing  GOAL:   Patient will meet greater than or equal to 90% of their needs  Progressing   MONITOR:   Diet advancement, Labs, Weight trends, Skin, I & O's  REASON FOR ASSESSMENT:   Malnutrition Screening Tool    ASSESSMENT:   82 y/o female with h/o ESRD on PD, DM, HTN and HLD who is admitted with lip swelling and angioedema.  2/18- advanced to nectar thick clear liquid diet with Nepro allowed  Reviewed I/O's: +960 ml x 24 hours and -6.3 L since admission    Per nephrology notes, PD held yesterday due to poor oral intake.   Per chart review, pt is more alert and taking thickened liquids via spoon. Case discussed with SLP and MD; pt was advanced to pureed diet with nectar thick liquids. NGT placement d/c.   Medications reviewed and include aranesp and decadron.   Labs reviewed: Na: 133, CBGS: 134-357 (inpatient orders for glycemic control are 0-5 units insulin aspart daily at bedtime, 0-9 units insulin aspart TID with meals, and 12 units insulin glargine-yfgn daily).    Diet Order:   Diet Order             DIET - DYS 1 Room service appropriate? Yes with Assist; Fluid consistency: Nectar Thick  Diet effective now                   EDUCATION NEEDS:   Education needs have been addressed  Skin:  Skin Assessment: Skin Integrity Issues: Skin Integrity Issues:: DTI DTI: rt and lt sacrum  Last BM:  10/18/21  Height:   Ht Readings from Last 1 Encounters:  10/18/21 5\' 4"  (1.626 m)    Weight:   Wt  Readings from Last 1 Encounters:  10/24/21 60.6 kg    Ideal Body Weight:  54.5 kg  BMI:  Body mass index is 22.93 kg/m.  Estimated Nutritional Needs:   Kcal:  1400-1600kcal/day  Protein:  70-80g/day  Fluid:  UOP +1L    Loistine Chance, RD, LDN, [place] Registered Dietitian II Certified Diabetes Care and Education Specialist Please refer to AMION for RD and/or RD on-call/weekend/after hours pager

## 2021-10-24 NOTE — H&P (View-Only) (Signed)
[facility] Vascular Consult Note  MRN : [id number]  Catherine Burke is a 81 y.o. (1941-01-04) female who presents with chief complaint of  Chief Complaint  Patient presents with   Facial Swelling  .  History of Present Illness: We are asked to consult by Colon Flattery. Catherine Burke is an 81 year old female that is currently on peritoneal dialysis however was recently admitted to [facility] due to angioedema.  The patient's angioedema has greatly improved however she will need to go to a rehab facility before she can return home.  Unfortunately, rehab facilities do not allow peritoneal dialysis.  Current Facility-Administered Medications  Medication Dose Route Frequency Provider Last Rate Last Admin   acetaminophen (TYLENOL) tablet 650 mg  650 mg Oral Q6H PRN Cox, Amy N, DO       Or   acetaminophen (TYLENOL) suppository 650 mg  650 mg Rectal Q6H PRN Cox, Amy N, DO       benzocaine (ORAJEL) 10 % mucosal gel   Mouth/Throat TID PRN Colon Flattery, NP   Given at 10/21/21 1333   chlorhexidine (PERIDEX) 0.12 % solution 15 mL  15 mL Mouth/Throat Daily Enzo Bi, MD   15 mL at 10/24/21 5053   Chlorhexidine Gluconate Cloth 2 % PADS 6 each  6 each Topical Daily Ralene Muskrat B, MD   6 each at 10/24/21 9767   cloNIDine (CATAPRES - Dosed in mg/24 hr) patch 0.2 mg  0.2 mg Transdermal Weekly Priscella Mann, Sudheer B, MD   0.2 mg at 10/18/21 1809   Darbepoetin Alfa (ARANESP) injection 100 mcg  100 mcg Subcutaneous Q Thu-1800 Murlean Iba, MD   100 mcg at 10/20/21 2311   dexamethasone (DECADRON) injection 6 mg  6 mg Intravenous Azzie Roup, MD   6 mg at 10/24/21 3419   dialysis solution 1.5% low-MG/low-CA dianeal solution   Intraperitoneal Q24H Kolluru, Sarath, MD   6,000 mL at 10/21/21 2020   feeding supplement (NEPRO CARB STEADY) liquid 237 mL  237 mL Oral TID Enzo Bi, MD   237 mL at 10/24/21 0824   gentamicin cream (GARAMYCIN) 0.1 % 1 application   1 application Topical Daily Kolluru, Sarath, MD   1 application at 37/90/24 1234   heparin 3,000 Units in dialysis solution 1.5% low-MG/low-CA 6,000 mL dialysis solution   Peritoneal Dialysis PRN Pearla Dubonnet, RPH   Given at 10/21/21 2021   heparin injection 5,000 Units  5,000 Units Subcutaneous Q8H Enzo Bi, MD   5,000 Units at 10/24/21 0521   hydrALAZINE (APRESOLINE) injection 10 mg  10 mg Intravenous Q4H PRN Ralene Muskrat B, MD   10 mg at 10/23/21 0948   hydrALAZINE (APRESOLINE) tablet 25 mg  25 mg Oral Q8H Enzo Bi, MD   25 mg at 10/24/21 0835   insulin aspart (novoLOG) injection 0-5 Units  0-5 Units Subcutaneous QHS Enzo Bi, MD   3 Units at 10/22/21 2228   insulin aspart (novoLOG) injection 0-9 Units  0-9 Units Subcutaneous TID WC Enzo Bi, MD   3 Units at 10/24/21 1234   insulin glargine-yfgn [facility]) injection 12 Units  12 Units Subcutaneous Daily Enzo Bi, MD   12 Units at 10/24/21 0825   metoprolol tartrate (LOPRESSOR) 25 mg/10 mL oral suspension 25 mg  25 mg Oral BID Enzo Bi, MD       multivitamin (RENA-VIT) tablet 1 tablet  1 tablet Oral QHS Enzo Bi, MD       ondansetron [facility]) tablet  4 mg  4 mg Oral Q6H PRN Cox, Amy N, DO       Or   ondansetron (ZOFRAN) injection 4 mg  4 mg Intravenous Q6H PRN Cox, Amy N, DO       pantoprazole (PROTONIX) injection 40 mg  40 mg Intravenous Q24H Ralene Muskrat B, MD   40 mg at 10/23/21 1506    Past Medical History:  Diagnosis Date   Anemia    Arthritis    B12 deficiency 04/04/2021   Diabetes mellitus    GERD (gastroesophageal reflux disease)    HLD (hyperlipidemia)    Hypercholesterolemia    Hypertension    Kidney stones    Osteoporosis    Proteinuria     Past Surgical History:  Procedure Laterality Date   ABDOMINAL HYSTERECTOMY     BACK SURGERY     lumbar   BREAST CYST ASPIRATION     unsure of laterality    CAPD INSERTION N/A 02/13/2018   Procedure: LAPAROSCOPIC INSERTION CONTINUOUS AMBULATORY PERITONEAL DIALYSIS   (CAPD) CATHETER;  Surgeon: Algernon Huxley, MD;  Location: [facility] ORS;  Service: Vascular;  Laterality: N/A;   CAPD INSERTION N/A 07/21/2021   Procedure: LAPAROSCOPIC INSERTION CONTINUOUS AMBULATORY PERITONEAL DIALYSIS  (CAPD) CATHETER;  Surgeon: Ronny Bacon, MD;  Location: [facility] ORS;  Service: General;  Laterality: N/A;   CAPD REMOVAL N/A 07/21/2021   Procedure: REMOVAL CONTINUOUS AMBULATORY PERITONEAL DIALYSIS  (CAPD) CATHETER;  Surgeon: Ronny Bacon, MD;  Location: [facility] ORS;  Service: General;  Laterality: N/A;   CHOLECYSTECTOMY     EYE SURGERY     bilateral cataract    NASAL SEPTUM SURGERY      Social History Social History   Tobacco Use   Smoking status: Never   Smokeless tobacco: Never  Vaping Use   Vaping Use: Never used  Substance Use Topics   Alcohol use: No    Alcohol/week: 0.0 standard drinks   Drug use: No    Family History Family History  Problem Relation Age of Onset   Other Mother    Heart attack Father    Diabetes Maternal Grandmother    Breast cancer Paternal Aunt     Allergies  Allergen Reactions   Ace Inhibitors Swelling   Levofloxacin Other (See Comments)    Throat Swelling    Penicillins Anaphylaxis   Other    Penicillin G Other (See Comments)   Sulfa Antibiotics      REVIEW OF SYSTEMS (Negative unless checked)  Constitutional: [] Weight loss  [] Fever  [] Chills Cardiac: [] Chest pain   [] Chest pressure   [] Palpitations   [] Shortness of breath when laying flat   [] Shortness of breath at rest   [] Shortness of breath with exertion. Vascular:  [] Pain in legs with walking   [] Pain in legs at rest   [] Pain in legs when laying flat   [] Claudication   [] Pain in feet when walking  [] Pain in feet at rest  [] Pain in feet when laying flat   [] History of DVT   [] Phlebitis   [] Swelling in legs   [] Varicose veins   [] Non-healing ulcers Pulmonary:   [] Uses home oxygen   [] Productive cough   [] Hemoptysis   [] Wheeze  [] COPD   [] Asthma Neurologic:  [] Dizziness   [] Blackouts   [] Seizures   [] History of stroke   [] History of TIA  [] Aphasia   [] Temporary blindness   [] Dysphagia   [] Weakness or numbness in arms   [] Weakness or numbness in legs Musculoskeletal:  [] Arthritis   []   Joint swelling   [] Joint pain   [] Low back pain Hematologic:  [] Easy bruising  [] Easy bleeding   [] Hypercoagulable state   [] Anemic  [] Hepatitis Gastrointestinal:  [] Blood in stool   [] Vomiting blood  [] Gastroesophageal reflux/heartburn   [] Difficulty swallowing. Genitourinary:  [] Chronic kidney disease   [] Difficult urination  [] Frequent urination  [] Burning with urination   [] Blood in urine Skin:  [] Rashes   [] Ulcers   [] Wounds Psychological:  [] History of anxiety   []  History of major depression.  Physical Examination  Vitals:   10/24/21 0030 10/24/21 0520 10/24/21 0543 10/24/21 0733  BP: (!) 129/55 (!) 178/84  (!) 158/57  Pulse: 65 71  (!) 58  Resp: 18 18  16   Temp: 98.3 F (36.8 C) 97.6 F (36.4 C)  97.9 F (36.6 C)  TempSrc: Oral Oral  Oral  SpO2: 99% 99%  100%  Weight:   60.6 kg   Height:       Body mass index is 22.93 kg/m. Gen:  WD/WN, NAD Head: [place]/AT, No temporalis wasting. Prominent temp pulse not noted. Neurologic: Sensation grossly intact in extremities.  Symmetrical.  Speech is fluent. Motor exam as listed above. Psychiatric: Judgment intact, Mood & affect appropriate for pt's clinical situation.    CBC Lab Results  Component Value Date   WBC 24.7 (H) 10/24/2021   HGB 7.9 (L) 10/24/2021   HCT 25.9 (L) 10/24/2021   MCV 87.2 10/24/2021   PLT 231 10/24/2021    BMET    Component Value Date/Time   NA 133 (L) 10/24/2021 0554   K 3.7 10/24/2021 0554   CL 100 10/24/2021 0554   CO2 25 10/24/2021 0554   GLUCOSE 138 (H) 10/24/2021 0554   BUN 78 (H) 10/24/2021 0554   CREATININE 5.55 (H) 10/24/2021 0554   CREATININE 0.89 06/02/2015 1027   CALCIUM 7.7 (L) 10/24/2021 0554   CALCIUM 7.5 (L) 10/12/2020 1535   GFRNONAA 7 (L) 10/24/2021 0554   GFRNONAA  69 06/30/2014 1055   GFRAA 13 (L) 02/11/2018 1334   GFRAA 79 06/30/2014 1055   Estimated Creatinine Clearance: 6.7 mL/min (A) (by C-G formula based on SCr of 5.55 mg/dL (H)).  COAG Lab Results  Component Value Date   INR 1.3 (H) 10/17/2021   INR 1.2 10/15/2021   INR 0.86 02/11/2018    Radiology CT ABDOMEN PELVIS WO CONTRAST  Result Date: 10/13/2021 CLINICAL DATA:  Epigastric abdominal pain. EXAM: CT ABDOMEN AND PELVIS WITHOUT CONTRAST TECHNIQUE: Multidetector CT imaging of the abdomen and pelvis was performed following the standard protocol without IV contrast. RADIATION DOSE REDUCTION: This exam was performed according to the departmental dose-optimization program which includes automated exposure control, adjustment of the mA and/or kV according to patient size and/or use of iterative reconstruction technique. COMPARISON:  Radiograph of same day. FINDINGS: Lower chest: Small bilateral pleural effusions are noted with minimal adjacent subsegmental atelectasis. Hepatobiliary: Status post cholecystectomy. No biliary dilatation is noted. Slightly nodular hepatic contours are noted suggesting possible hepatic cirrhosis. Pancreas: Unremarkable. No pancreatic ductal dilatation or surrounding inflammatory changes. Spleen: Normal in size without focal abnormality. Adrenals/Urinary Tract: Adrenal glands appear normal. Left renal atrophy is noted. Right renal cysts are noted. No hydronephrosis or renal obstruction is noted. Urinary bladder is unremarkable. 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 Stomach/Bowel: Stomach is within normal limits. Appendix appears normal. No evidence of bowel wall thickening, distention, or inflammatory changes. Vascular/Lymphatic: Aortic atherosclerosis. No enlarged abdominal or pelvic lymph nodes. Reproductive: Status post hysterectomy. No adnexal masses.  Other: Peritoneal dialysis catheter is noted with coiled tip seen in the pelvis. Small amount of free fluid is  noted in the pelvis. There is also noted moderate pneumoperitoneum in the epigastric region as noted on prior radiograph of same day. This could be related to the presence of the dialysis catheter. Musculoskeletal: No acute or significant osseous findings. IMPRESSION: Moderate pneumoperitoneum is noted in the epigastric region as noted on radiograph of same day. This most likely is due to the presence of peritoneal dialysis catheter. The possibility of rupture of hollow viscus cannot be excluded, although there are no other signs to suggest bowel perforation on this study. These results were called by telephone at the time of interpretation on 10/13/2021 at 2:24 pm to provider [facility] , who verbally acknowledged these results. 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 Small bilateral pleural effusions with adjacent subsegmental atelectasis. Aortic Atherosclerosis (ICD10-I70.0). Electronically Signed   By: Marijo Conception M.D.   On: 10/13/2021 14:25   DG Chest 2 View  Result Date: 10/13/2021 CLINICAL DATA:  Chest pain beginning yesterday. Shortness of breath. Nausea. EXAM: CHEST - 2 VIEW COMPARISON:  04/25/2021 FINDINGS: Free intraperitoneal air is seen beneath both hemidiaphragms. Mild cardiomegaly stable. Mild scarring is seen in both lung bases. No evidence of acute infiltrate or pulmonary edema. Stable mild thoracic dextroscoliosis. IMPRESSION: Free intraperitoneal air. This may be due to peritoneal dialysis, although perforated viscus cannot be excluded. Recommend clinical correlation, and consider abdomen pelvis CT if warranted. Mild cardiomegaly and bibasilar scarring. No active lung disease. Critical Value/emergent results were called by telephone at the time of interpretation on 10/13/2021 at 12:24 pm to provider [facility] , who verbally acknowledged these results. Electronically Signed   By: Marlaine Hind M.D.   On: 10/13/2021 12:28   CT Soft Tissue Neck Wo  Contrast  Result Date: 10/14/2021 CLINICAL DATA:  Right neck/facial swelling, pain, and sore throat. EXAM: CT NECK WITHOUT CONTRAST TECHNIQUE: Multidetector CT imaging of the neck was performed following the standard protocol without intravenous contrast. RADIATION DOSE REDUCTION: This exam was performed according to the departmental dose-optimization program which includes automated exposure control, adjustment of the mA and/or kV according to patient size and/or use of iterative reconstruction technique. COMPARISON:  Chest CTA 10/13/2021. FINDINGS: Pharynx and larynx: Moderate low-density soft tissue swelling at the level of the hypopharynx bilaterally effacing the para form sinuses, new from the recent chest CTA. Mild narrowing of the lower oropharyngeal and supraglottic airway. No retropharyngeal fluid collection. No evidence of significant edema involving the epiglottis or floor of mouth. Salivary glands: No inflammation, mass, or stone. Thyroid: Thyroid nodules measuring up to 1.4 cm as seen on the recent chest CTA and for which no follow-up imaging is recommended. Lymph nodes: No enlarged or suspicious lymph nodes in the neck. Vascular: Calcified atherosclerosis at the carotid bifurcations. Limited intracranial: Proximally 2 cm partially calcified mass along the right sphenoid wing, incompletely imaged. Visualized orbits: Bilateral cataract extraction. Mastoids and visualized paranasal sinuses: Mild mucosal thickening/small mucous retention cysts in the left greater than right maxillary sinuses. Trace right mastoid fluid. Skeleton: Moderate cervical disc and facet degeneration. Upper chest: Mild biapical pleuroparenchymal lung scarring. Other: Moderate to prominent right lower facial soft tissue swelling. No drainable fluid collection identified within limitations of noncontrast technique and dental streak artifact. IMPRESSION: 1. Right lower facial soft tissue swelling, possibly cellulitis. No drainable  fluid collection identified. 2. Moderate hypopharyngeal swelling, new from 10/13/2021 and  of uncertain etiology though possibly infectious or allergic. 3. 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 Consider nonemergent contrast enhanced brain MRI for further evaluation. Electronically Signed   By: Logan Bores M.D.   On: 10/15/2021 17:18   CT Angio Chest Pulmonary Embolism (PE) W or WO Contrast  Result Date: 10/13/2021 CLINICAL DATA:  Generalized chest pain. Shortness of breath. Nausea. EXAM: CT ANGIOGRAPHY CHEST WITH CONTRAST TECHNIQUE: Multidetector CT imaging of the chest was performed using the standard protocol during bolus administration of intravenous contrast. Multiplanar CT image reconstructions and MIPs were obtained to evaluate the vascular anatomy. RADIATION DOSE REDUCTION: This exam was performed according to the departmental dose-optimization program which includes automated exposure control, adjustment of the mA and/or kV according to patient size and/or use of iterative reconstruction technique. CONTRAST:  38mL OMNIPAQUE IOHEXOL 350 MG/ML SOLN COMPARISON:  Chest radiograph and abdominal CT of earlier today. Chest CT of 02/11/2019 FINDINGS: Cardiovascular: The quality of this exam for evaluation of pulmonary embolism is moderate. The bolus is relatively well timed. There is mild motion degradation inferiorly. No pulmonary embolism to the large segmental level. Aortic atherosclerosis. Mild cardiomegaly, without pericardial effusion. Mediastinum/Nodes: Multiple small bilateral thyroid nodules including at up to 1.4 cm. Not clinically significant; no follow-up imaging recommended (ref: J Am Coll Radiol. 2015 Feb;12(2): 143-50) No mediastinal or hilar adenopathy. Small hiatal hernia. Lungs/Pleura: Trace bilateral pleural effusions. Pleural-based right upper and right middle lobe pulmonary nodules of maximally 4 mm are similar and likely subpleural lymph  nodes. Mild dependent bibasilar atelectasis. Upper Abdomen: Free intraperitoneal air, as detailed on today's dedicated abdominal CT. Other abdominal findings deferred to that exam. Musculoskeletal: Osteopenia. Review of the MIP images confirms the above findings. IMPRESSION: 1. No pulmonary embolism with limitations above. 2.  No acute process in the chest. 3. Free intraperitoneal air, as on today's abdominal CT. 4. Small bilateral pleural effusions. 5.  Aortic Atherosclerosis (ICD10-I70.0). 6. Small hiatal hernia. Electronically Signed   By: Abigail Miyamoto M.D.   On: 10/13/2021 20:10   US Venous Img Upper Uni Right(DVT)  Result Date: 10/22/2021 CLINICAL DATA:  Right arm swelling EXAM: RIGHT UPPER EXTREMITY VENOUS DOPPLER ULTRASOUND TECHNIQUE: Gray-scale sonography with graded compression, as well as color Doppler and duplex ultrasound were performed to evaluate the upper extremity deep venous system from the level of the subclavian vein and including the jugular, axillary, basilic, radial, ulnar and upper cephalic vein. Spectral Doppler was utilized to evaluate flow at rest and with distal augmentation maneuvers. COMPARISON:  None. FINDINGS: Contralateral Subclavian Vein: Respiratory phasicity is normal and symmetric with the symptomatic side. No evidence of thrombus. Normal compressibility. Internal Jugular Vein: No evidence of thrombus. Normal compressibility, respiratory phasicity and response to augmentation. Subclavian Vein: No evidence of thrombus. Normal compressibility, respiratory phasicity and response to augmentation. Axillary Vein: No evidence of thrombus. Normal compressibility, respiratory phasicity and response to augmentation. Cephalic Vein: No evidence of thrombus. Normal compressibility, respiratory phasicity and response to augmentation. Basilic Vein: No evidence of thrombus. Normal compressibility, respiratory phasicity and response to augmentation. Brachial Veins: No evidence of thrombus.  Normal compressibility, respiratory phasicity and response to augmentation. Radial Veins: No evidence of thrombus. Normal compressibility, respiratory phasicity and response to augmentation. Ulnar Veins: No evidence of thrombus. Normal compressibility, respiratory phasicity and response to augmentation. Venous Reflux:  None visualized. Other Findings:  None visualized. IMPRESSION: No evidence of DVT within the right upper extremity. Electronically Signed   By: Elta Guadeloupe  Lukens M.D.   On: 10/22/2021 03:39   DG CHEST PORT 1 VIEW  Result Date: 10/19/2021 CLINICAL DATA:  New onset atrial fibrillation EXAM: PORTABLE CHEST 1 VIEW COMPARISON:  August 17, 2022 FINDINGS: The heart size and mediastinal contours are stable. Aortic atherosclerosis. Pulmonary vascular congestion. No overt pulmonary edema or focal airspace consolidation. No visible pleural effusion or pneumothorax. The visualized skeletal structures are unchanged. IMPRESSION: Pulmonary vascular congestion without overt pulmonary edema or focal airspace consolidation. Electronically Signed   By: Dahlia Bailiff M.D.   On: 10/19/2021 08:06   DG Chest Port 1 View  Result Date: 10/18/2021 CLINICAL DATA:  Hypoxia. EXAM: PORTABLE CHEST 1 VIEW COMPARISON:  10/31/2021 FINDINGS: Stable cardiomediastinal contours. Aortic atherosclerosis noted. Pulmonary vascular congestion. No pleural effusion or airspace consolidation. IMPRESSION: Pulmonary vascular congestion. Electronically Signed   By: Kerby Moors M.D.   On: 10/18/2021 09:26   DG Chest Port 1 View  Result Date: 10/18/2021 CLINICAL DATA:  Possible sepsis. EXAM: PORTABLE CHEST 1 VIEW COMPARISON:  10/15/2021 FINDINGS: Artifact overlies the chest. Heart size is normal. There is aortic atherosclerosis. The lungs are clear. No heart failure. No acute finding. IMPRESSION: No active disease. Electronically Signed   By: Nelson Chimes M.D.   On: 10/14/2021 16:57   DG Chest Port 1 View  Result Date:  10/15/2021 CLINICAL DATA:  Fever.  Possible sepsis. EXAM: PORTABLE CHEST 1 VIEW COMPARISON:  Chest radiograph dated 10/13/2021. FINDINGS: Minimal left lung base atelectasis similar or improved since the prior radiograph. Trace left pleural effusion. No new consolidation. No pneumothorax. The cardiac silhouette is within limits. Atherosclerotic calcification of the aorta. Osteopenia with degenerative changes of the spine. No acute osseous pathology. Minimal pneumoperitoneum along the right hemidiaphragm, decreased since the prior study. IMPRESSION: 1. Minimal left lung base atelectasis similar or improved since the prior radiograph. Trace left pleural effusion. 2. Minimal residual pneumoperitoneum under the right hemidiaphragm. Electronically Signed   By: Anner Crete M.D.   On: 10/15/2021 22:18   DG ABD ACUTE 2+V W 1V CHEST  Result Date: 10/14/2021 CLINICAL DATA:  Abdominal pain EXAM: DG ABDOMEN ACUTE WITH 1 VIEW CHEST COMPARISON:  10/13/2021 CT abdomen/pelvis FINDINGS: Stable cardiomediastinal silhouette with normal heart size. No pneumothorax. No pleural effusion. No overt pulmonary edema. Mild smooth is lateral basilar right pleural thickening, unchanged. Streaky linear mild bibasilar atelectasis is similar. No acute consolidative airspace disease. Free air under the right hemidiaphragm is slightly decreased. Previously visualized free air under the left hemidiaphragm has resolved. Peritoneal dialysis catheter terminates over the deep pelvis to the right of midline. No disproportionately dilated small bowel loops. Moderate gas and mild stool throughout the large bowel, most prominent in the sigmoid colon. Cholecystectomy clips are seen in the right upper quadrant of the abdomen. No radiopaque nephrolithiasis. IMPRESSION: 1. Decreased free air under the right hemidiaphragm, as noted on imaging from 1 day prior, favored to be due to peritoneal dialysis catheter. 2. Nonobstructive bowel gas pattern. 3.  Moderate gas and mild stool throughout the large bowel, most prominent in the sigmoid colon. 4. Mild bibasilar atelectasis. No acute cardiopulmonary disease. Electronically Signed   By: Ilona Sorrel M.D.   On: 10/14/2021 08:33      Assessment/Plan End-stage renal disease Currently the patient is on peritoneal dialysis however she will be going to a rehab facility at discharge.  Unfortunately, rehab facilities do not allow for peritoneal dialysis and she would need a PermCath for hemodialysis.  It was explained to the patient  that once she is able to return home she can resume peritoneal dialysis and the PermCath can be removed.  We discussed the risk, benefits and alternatives of placement of PermCath.  The patient agrees to proceed.   Kris Hartmann, NP  10/24/2021 1:49 PM    This note was created with Dragon medical transcription system.  Any error is purely unintentional

## 2021-10-24 NOTE — Progress Notes (Signed)
Speech Language Pathology Treatment: Dysphagia  Patient Details Name: Catherine Burke MRN: [id number] DOB: August 05, 1941 Today's Date: 10/24/2021 Time: 9326-7124 SLP Time Calculation (min) (ACUTE ONLY): 50 min  Assessment / Plan / Recommendation Clinical Impression  Pt seen today for ongoing assessment of swallowing and toleration of Nectar liquids diet. Family was not present this session. Pt was resting in bed; positioning support w/ pillows under arms given for more upright sitting and for self-feeding. Speech more intelligible each session; increased lingual movements noted. Pt stated she felt "better" though still weak.   Session initiated w/ focus on oral care, and education of such to address oral stimulation for lingual movements and swallowing as well as provide oral hygiene. Pt is noting increased sloughing of oral mucosa; noted more along laterals and underneath tongue.   Noted pt's improved lingual movements; static movements and during speech(speech ~90% intelligible. Practiced protrusion and anterior movements during oral care; pt able to achieve lingual protrusion past lips, elevation and lateral movements.No gross facial/buccal swelling noted; pt stated her arms are less swollen also.  Oral care completed. Pt stated she is still having increased saliva -- gave wash clothes to wipe mouth. Instructed pt to continue oral care frequently as instructed and as tolerates during the day for stimulation/swallowing.    Pt fed self trials of single ice chips (5) were tolerated w/ improved lingual movements for bolus control and A-P transfer movements for swallowing. No overt clinical s/s of aspiration were noted during trials; vocal quality clear and no decline in O2 sats. Oral phase time appeared adequate though min deliberate lingual movements observed(pt agreed) d/t the coordination of tongue itself. Pharyngeal swallows were prompt and complete. Trials of thin liquids (5) were tolerated w/  inconsistent throat clearing; suspect delayed pharyngeal swallow initiation and/or residue remaining post swallow(d/t the increased saliva). No O2 desaturation. Oral phase appeared adequate for bolus control and timely A-P transfer; sipping from Cup was appropriate w/ no anterior leakage.  Pt was then given trials of puree(applesauce, pudding) alternating w/ Nectar liquids via Cup w/ similar results: no overt s/s of aspiration, clear vocal quality b/t trials, and oral phase was timely w/ adequate bolus manipulation and cohesiveness control. Lingual movements appeared adequate for bolus transfer, swallow, and orally clearing w/ trials -- min extra time given for lingual sweeping and f/u, Dry swallow to fully clear(bolus trials mixed w/ pt's saliva). No anterior loss occurred.   Recommend initiation of Dysphagia level 1(puree foods) w/ Nectar liquids today; aspiration precautions. Lingual sweeping and f/u, Dry swallows as needed to fully clear orally. Frequent oral hygiene. Discussed and educated pt/NSG on initiation of diet; MD updated. ST services will continue to follow pt's status for toleration of diet; trials to upgrade. Dietician updated.     HPI HPI: Pt  is a 81 y.o. 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  According to the patient and family since yesterday, patient was admitted to [facility] hospital 2 days ago for pneumoperitoneum suspected to be related to peritoneal dialysis, discharged home and then return to the emergency department at [facility] yesterday for bilateral shoulder pain, mild abdominal pain which was evaluated with no emergent or is new abnormal findings; no pneumonia.  She was discharged home without any interventions and states the orofacial issues have worsened since yesterday.  Per chart review these issues were not  noted or evaluated  in the emergency department yesterday.  She did have a low-grade fever yesterday per family members but no fever today.  No known dental issues that they are aware of.  She has not been eating or drinking well because of the pain in her swollen mouth/facial area and difficulty swallowing.  Not trying any medications for symptoms thus far.  History of similar issues in the past.   CT of Neck: Right lower facial soft tissue swelling, possibly cellulitis. No  drainable fluid collection identified.  2. Moderate hypopharyngeal swelling, new from 62/70/3500 and of  uncertain etiology though possibly infectious or allergic.   CXR: no active disease.      SLP Plan  Continue with current plan of care      Recommendations for follow up therapy are one component of a multi-disciplinary discharge planning process, led by the attending physician.  Recommendations may be updated based on patient status, additional functional criteria and insurance authorization.    Recommendations  Diet recommendations: Dysphagia 1 (puree);Nectar-thick liquid Liquids provided via: Cup;No straw Medication Administration: Crushed with puree Supervision: Patient able to self feed;Intermittent supervision to cue for compensatory strategies (tray setup) Compensations: Minimize environmental distractions;Slow rate;Small sips/bites;Lingual sweep for clearance of pocketing;Multiple dry swallows after each bite/sip;Follow solids with liquid Postural Changes and/or Swallow Maneuvers: Out of bed for meals;Seated upright 90 degrees;Upright 30-60 min after meal                General recommendations:  (Dietician f/u) Oral Care Recommendations: Oral care BID;Oral care before and after PO;Staff/trained caregiver to provide oral care Follow Up Recommendations: Skilled nursing-short term rehab (<3 hours/day) Assistance recommended at discharge: Intermittent Supervision/Assistance SLP Visit Diagnosis: Dysphagia,  oropharyngeal phase (R13.12) Plan: Continue with current plan of care             Orinda Kenner, [place], [place]; [place] [phone] (ascom) Alyssia Heese  10/24/2021, 1:16 PM

## 2021-10-24 NOTE — TOC Progression Note (Signed)
Transition of Care [facility]) - Progression Note    Patient Details  Name: Catherine Burke MRN: [id number] Date of Birth: 11-02-1940  Transition [facility]) CM/SW [place], RN Phone Number: 10/24/2021, 3:10 PM  Clinical Narrative:  RNCM spoke with patient about SNF.  Patient requests [facility], however they cannot accommodate patient at this time.  Patient asks that [facility] call her daughter [name] for additional information.  RNCM contacted patient's daughter [name], who asked that bed search be centered in [place].  Bed search extended to [facility] and [place], awaiting responses.  TOC to follow     Expected Discharge Plan: [place] Barriers to Discharge: Continued Medical Work up  Expected Discharge Plan and Services Expected Discharge Plan: [place]   Discharge Planning Services: CM Consult   Living arrangements for the past 2 months: Single Family Home                 DME Arranged: N/A DME Agency: NA                   Social Determinants of Health (SDOH) Interventions    Readmission Risk Interventions Readmission Risk Prevention Plan 10/19/2021  Transportation Screening Complete  Medication Review Press photographer) Complete  PCP or Specialist appointment within 3-5 days of discharge Complete  HRI or Home Care Consult Complete  SW Recovery Care/Counseling Consult Complete  Palliative Care Screening Not [place] Complete  Some recent data might be hidden

## 2021-10-24 NOTE — Progress Notes (Signed)
Physical Therapy Treatment Patient Details Name: Catherine Burke MRN: [id number] DOB: Jun 09, 1941 Today's Date: 10/24/2021   History of Present Illness Pt is an 81 y/o F admitted 10/18/2021 after presenting to the ED with concerns of R facial swelling. Facial swelling thought to be due to angioedema 2/2 ACE inhibitor use. PMH: ESRD on PD, HTN, IDDM, HLD    PT Comments    Pt seen for PT tx with pt agreeable. Pt is able to perform bed mobility with reliance on hospital bed features & supervision. Pt requires mod assist for sit>stand from EOB at beginning of session but heavy max assist for sit>stand from recliner after walking. Pt is able to increase gait distances today & engages in standing exercises but fatigues quickly & requires increased assistance as session progresses. Pt reports her son plans to move in with her & provide 24 hr assistance but pt is eager to go to rehab to increase strength prior to return home. Pt would benefit from STR as pt was independent without AD prior to admission & on this date requires RW & up to max assist for transfers. Will continue to follow pt acutely to progress gait with LRAD, balance & strength.    Recommendations for follow up therapy are one component of a multi-disciplinary discharge planning process, led by the attending physician.  Recommendations may be updated based on patient status, additional functional criteria and insurance authorization.  Follow Up Recommendations  Skilled nursing-short term rehab (<3 hours/day)     Assistance Recommended at Discharge Intermittent Supervision/Assistance  Patient can return home with the following A lot of help with walking and/or transfers;A lot of help with bathing/dressing/bathroom;Assist for transportation;Assistance with cooking/housework;Help with stairs or ramp for entrance   Equipment Recommendations  Rolling walker (2 wheels);BSC/3in1    Recommendations for Other Services       Precautions /  Restrictions Precautions Precautions: Fall Precaution Comments: peritoneal dialysis catheter in abdomen Restrictions Weight Bearing Restrictions: No     Mobility  Bed Mobility Overal bed mobility: Needs Assistance Bed Mobility: Sit to Supine     Supine to sit: HOB elevated, Supervision (use of bed rails, extra time)          Transfers Overall transfer level: Needs assistance Equipment used: Rolling walker (2 wheels) Transfers: Sit to/from Stand Sit to Stand: Mod assist, Max assist (mod assist first attempt, max assist 2nd attempt)                Ambulation/Gait Ambulation/Gait assistance: Min guard Gait Distance (Feet): 50 Feet Assistive device: Rolling walker (2 wheels) Gait Pattern/deviations: Decreased step length - right, Decreased step length - left, Decreased stride length Gait velocity: decreased         Stairs             Wheelchair Mobility    Modified Rankin (Stroke Patients Only)       Balance Overall balance assessment: Needs assistance Sitting-balance support: Feet supported Sitting balance-Leahy Scale: Fair Sitting balance - Comments: supervision static sitting   Standing balance support: Bilateral upper extremity supported, During functional activity Standing balance-Leahy Scale: Fair                              Cognition Arousal/Alertness: Awake/alert Behavior During Therapy: WFL for tasks assessed/performed Overall Cognitive Status: Within Functional Limits for tasks assessed  General Comments: Sweet lady        Exercises General Exercises - Lower Extremity Hip Flexion/Marching: AROM, Strengthening, Both, 10 reps, Standing    General Comments        Pertinent Vitals/Pain Pain Assessment Pain Assessment: No/denies pain    Home Living                          Prior Function            PT Goals (current goals can now be found in the care  plan section) Acute Rehab PT Goals Patient Stated Goal: go to rehab to get stronger PT Goal Formulation: With patient Time For Goal Achievement: 11/05/21 Potential to Achieve Goals: Good Progress towards PT goals: Progressing toward goals    Frequency    Min 2X/week      PT Plan Current plan remains appropriate    Co-evaluation              AM-PAC PT "6 Clicks" Mobility   Outcome Measure  Help needed turning from your back to your side while in a flat bed without using bedrails?: None Help needed moving from lying on your back to sitting on the side of a flat bed without using bedrails?: A Little Help needed moving to and from a bed to a chair (including a wheelchair)?: A Little Help needed standing up from a chair using your arms (e.g., wheelchair or bedside chair)?: A Lot Help needed to walk in hospital room?: A Little Help needed climbing 3-5 steps with a railing? : Total 6 Click Score: 16    End of Session Equipment Utilized During Treatment: Gait belt Activity Tolerance: Patient tolerated treatment well;Patient limited by fatigue Patient left: in chair;with chair alarm set;with call bell/phone within reach Nurse Communication: Mobility status PT Visit Diagnosis: Muscle weakness (generalized) (M62.81);Difficulty in walking, not elsewhere classified (R26.2);Unsteadiness on feet (R26.81)     Time: 1347-1410 PT Time Calculation (min) (ACUTE ONLY): 23 min  Charges:  $Therapeutic Activity: 23-37 mins                     Lavone Nian, PT, DPT 10/24/21, 4:08 PM    Waunita Schooner 10/24/2021, 4:06 PM

## 2021-10-24 NOTE — Progress Notes (Addendum)
PROGRESS NOTE    Catherine Burke  ION:[id number] DOB: 1941/08/14 DOA: 10/23/2021 PCP: Idelle Crouch, MD  [id number]/[id number]-AA   Assessment & Plan:   Principal Problem:   Angioedema, initial encounter Active Problems:   Hyperlipidemia   Essential hypertension   GERD (gastroesophageal reflux disease)   Chronic kidney disease (CKD), stage IV (severe) (HCC)   ESRD (end stage renal disease) ([place])   Facial cellulitis   Malnutrition of moderate degree   Angioedema   81 year old with medical history of end-stage renal disease on peritoneal dialysis, hypertension, insulin-dependent diabetes mellitus, who presented emergency department for chief concerns of right facial swelling.   Facial swelling thought to be due to angioedema secondary to ACE inhibitor use.  ACE inhibitor's been discontinued and added to allergy list.   2/14:  Patient had worsening of her facial and tongue swelling.  Pt transferred to [facility] and PCCM consulted on 2/14 for close monitoring.   * Angioedema --s/p -Solu-Medrol 125 mg x 1, Benadryl 25 mg x 1, Pepcid 20 mg IV x1 -EpiPen x1 --has been on IV decadron 6 mg q6h Plan: --change IV decadron to oral and taper to 6 mg q12h --oral care per SLP order  Dysphagia --due to tongue swelling and poor lingual control of boluses and coordination  Plan: --SLP eval today, rec Dys 1 with nectar thick --d/c MIVF   Essential hypertension Poor control over interval --home ARB d/c'ed --cont clonidine patch 0.2 mg --d/c IV labetalol --start Lopressor 25 mg BID   ESRD on peritoneal dialysis (end stage renal disease) ([place]) --iPD per nephrology --need permCath placement for HD since pt is going to SNF rehab   Facial cellulitis, ruled out Sepsis, ruled out Facial cellulitis felt unlikely - DC'ed antibiotics  Anemia of CKD --Mircera as outpatient  Afib, isolated episode --noted early morning 2/15, new dx, likely related to acute illness --started on heparin gtt,  since d/c'ed --currently rate controlled and back to NSR  Hypokalemia --monitor and replete PRN  DM2, well controlled Hyperglycemia exacerbated by steroid use --A1c 5.7 --cont glargine 12u daily --SSI increase to resistant scale  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  --nonemergent contrast enhanced brain MRI for further evaluation as outpatient   DVT prophylaxis: Lovenox SQ Code Status: Limited code  Family Communication:   Level of care: Telemetry Medical Dispo:   The patient is from: home Anticipated d/c is to: SNF Anticipated d/c date is: 1-2 days Patient currently is not medically ready to d/c due to: needing to set up outpatient dialysis   Subjective and Interval History:  Pt was started on Dys 1 diet today, tolerated it well.  Still having arm swelling, today worse on the left side.   Objective: Vitals:   10/24/21 0520 10/24/21 0543 10/24/21 0733 10/24/21 1558  BP: (!) 178/84  (!) 158/57 (!) 159/62  Pulse: 71  (!) 58 67  Resp: 18  16 16   Temp: 97.6 F (36.4 C)  97.9 F (36.6 C) 99.2 F (37.3 C)  TempSrc: Oral  Oral Oral  SpO2: 99%  100% 100%  Weight:  60.6 kg    Height:        Intake/Output Summary (Last 24 hours) at 10/24/2021 1840 Last data filed at 10/23/2021 1847 Gross per 24 hour  Intake 120 ml  Output --  Net 120 ml   Filed Weights   10/22/21 1956 10/23/21 0553 10/24/21 0543  Weight: 60.5 kg 59.3 kg  60.6 kg    Examination:   Constitutional: NAD, AAOx3, sitting up eating HEENT: conjunctivae and lids normal, EOMI, tongue smaller, with still some crusting  CV: No cyanosis.   RESP: normal respiratory effort, on RA Extremities: No effusions, edema in BLE.  Arm swelling, L>R SKIN: warm, dry Neuro: II - XII grossly intact.   Psych: Normal mood and affect.  Appropriate judgement and reason   Data Reviewed: I have personally reviewed following labs and imaging studies  CBC: Recent Labs   Lab 10/20/21 0440 10/21/21 0424 10/22/21 0503 10/23/21 0445 10/24/21 0554  WBC 16.4* 13.6* 13.2* 14.9* 24.7*  HGB 8.9* 8.0* 7.8* 7.4* 7.9*  HCT 27.6* 25.2* 24.9* 23.9* 25.9*  MCV 84.9 85.4 87.1 86.9 87.2  PLT 263 241 231 205 124   Basic Metabolic Panel: Recent Labs  Lab 10/20/21 0440 10/21/21 0424 10/22/21 0503 10/23/21 0445 10/24/21 0554 10/24/21 1040  NA 132* 133* 134* 132* 133*  --   K 3.3* 3.5 3.4* 3.1* 3.7  --   CL 95* 98 97* 97* 100  --   CO2 26 26 25 25 25   --   GLUCOSE 236* 304* 351* 364* 138*  --   BUN 59* 61* 64* 69* 78*  --   CREATININE 5.30* 5.13* 4.91* 4.93* 5.55*  --   CALCIUM 7.8* 7.7* 7.7* 7.6* 7.7*  --   MG 2.3 2.1 2.0 1.9 2.0 1.9  PHOS  --   --   --   --   --  5.8*   GFR: Estimated Creatinine Clearance: 6.7 mL/min (A) (by C-G formula based on SCr of 5.55 mg/dL (H)). Liver Function Tests: No results for input(s): AST, ALT, ALKPHOS, BILITOT, PROT, ALBUMIN in the last 168 hours.  No results for input(s): LIPASE, AMYLASE in the last 168 hours. No results for input(s): AMMONIA in the last 168 hours. Coagulation Profile: No results for input(s): INR, PROTIME in the last 168 hours.  Cardiac Enzymes: No results for input(s): CKTOTAL, CKMB, CKMBINDEX, TROPONINI in the last 168 hours. BNP (last 3 results) No results for input(s): PROBNP in the last 8760 hours. HbA1C: No results for input(s): HGBA1C in the last 72 hours.  CBG: Recent Labs  Lab 10/23/21 2121 10/24/21 0022 10/24/21 0734 10/24/21 1214 10/24/21 1600  GLUCAP 149* 134* 143* 241* 238*   Lipid Profile: No results for input(s): CHOL, HDL, LDLCALC, TRIG, CHOLHDL, LDLDIRECT in the last 72 hours. Thyroid Function Tests: No results for input(s): TSH, T4TOTAL, FREET4, T3FREE, THYROIDAB in the last 72 hours.  Anemia Panel: No results for input(s): VITAMINB12, FOLATE, FERRITIN, TIBC, IRON, RETICCTPCT in the last 72 hours. Sepsis Labs: No results for input(s): PROCALCITON, LATICACIDVEN in  the last 168 hours.   Recent Results (from the past 240 hour(s))  Culture, blood (Routine x 2)     Status: None   Collection Time: 10/15/21 10:00 PM   Specimen: BLOOD LEFT WRIST  Result Value Ref Range Status   Specimen Description   Final    BLOOD LEFT WRIST BOTTLES DRAWN AEROBIC AND ANAEROBIC   Special Requests Blood Culture adequate volume  Final   Culture   Final    NO GROWTH 5 DAYS Performed at [facility], [address] [place], [place] [address]    Report Status 10/20/2021 FINAL  Final  Culture, blood (Routine x 2)     Status: None   Collection Time: 10/15/21 10:44 PM   Specimen: BLOOD RIGHT ARM  Result Value Ref Range Status   Specimen  Description BLOOD RIGHT ARM  Final   Special Requests   Final    BOTTLES DRAWN AEROBIC AND ANAEROBIC Blood Culture adequate volume   Culture   Final    NO GROWTH 5 DAYS Performed at [facility], [address], [place], [place] [address]    Report Status 10/20/2021 FINAL  Final  Resp Panel by RT-PCR (Flu A&B, Covid) Nasopharyngeal Swab     Status: None   Collection Time: 10/18/2021  5:18 PM   Specimen: Nasopharyngeal Swab; Nasopharyngeal(NP) swabs in vial transport medium  Result Value Ref Range Status   SARS Coronavirus 2 by RT PCR NEGATIVE NEGATIVE Final    Comment: (NOTE) SARS-CoV-2 target nucleic acids are NOT DETECTED.  The SARS-CoV-2 RNA is generally detectable in upper respiratory specimens during the acute phase of infection. The lowest concentration of SARS-CoV-2 viral copies this assay can detect is 138 copies/mL. A negative result does not preclude SARS-Cov-2 infection and should not be used as the sole basis for treatment or other patient management decisions. A negative result may occur with  improper specimen collection/handling, submission of specimen other than nasopharyngeal swab, presence of viral mutation(s) within the areas targeted by this assay, and inadequate number of viral copies(<138 copies/mL). A negative  result must be combined with clinical observations, patient history, and epidemiological information. The expected result is Negative.  Fact Sheet for Patients:  EntrepreneurPulse.com.au  Fact Sheet for Healthcare Providers:  IncredibleEmployment.be  This test is no t yet approved or cleared by the [place] FDA and  has been authorized for detection and/or diagnosis of SARS-CoV-2 by FDA under an Emergency Use Authorization (EUA). This EUA will remain  in effect (meaning this test can be used) for the duration of the COVID-19 declaration under Section 564(b)(1) of the Act, 21 U.S.C.section 360bbb-3(b)(1), unless the authorization is terminated  or revoked sooner.       Influenza A by PCR NEGATIVE NEGATIVE Final   Influenza B by PCR NEGATIVE NEGATIVE Final    Comment: (NOTE) The Xpert Xpress SARS-CoV-2/FLU/RSV plus assay is intended as an aid in the diagnosis of influenza from Nasopharyngeal swab specimens and should not be used as a sole basis for treatment. Nasal washings and aspirates are unacceptable for Xpert Xpress SARS-CoV-2/FLU/RSV testing.  Fact Sheet for Patients: EntrepreneurPulse.com.au  Fact Sheet for Healthcare Providers: IncredibleEmployment.be  This test is not yet approved or cleared by the [place] FDA and has been authorized for detection and/or diagnosis of SARS-CoV-2 by FDA under an Emergency Use Authorization (EUA). This EUA will remain in effect (meaning this test can be used) for the duration of the COVID-19 declaration under Section 564(b)(1) of the Act, 21 U.S.C. section 360bbb-3(b)(1), unless the authorization is terminated or revoked.  Performed at [facility], [address] [place], [place] [address]   Body fluid culture w Gram Stain     Status: None   Collection Time: 10/30/2021 11:40 PM   Specimen: Peritoneal Washings; Body Fluid  Result Value Ref  Range Status   Specimen Description   Final    PERITONEAL DIALYSIS Performed at [facility], [address] [place], [place] [address]    Special Requests   Final    NONE Performed at [facility], [place], [place] [address]    Gram Stain   Final    NO SQUAMOUS EPITHELIAL CELLS SEEN NO WBC SEEN NO ORGANISMS SEEN    Culture   Final    NO GROWTH 3  DAYS Performed at [facility], [place] [address], [place], [place] [address]    Report Status 10/20/2021 FINAL  Final  MRSA Next Gen by PCR, Nasal     Status: None   Collection Time: 10/18/21  1:40 PM   Specimen: Nasal Mucosa; Nasal Swab  Result Value Ref Range Status   MRSA by PCR Next Gen NOT DETECTED NOT DETECTED Final    Comment: (NOTE) The GeneXpert MRSA Assay (FDA approved for NASAL specimens only), is one component of a comprehensive MRSA colonization surveillance program. It is not intended to diagnose MRSA infection nor to guide or monitor treatment for MRSA infections. Test performance is not FDA approved in patients less than 89 years old. Performed at [facility], [address], [place], [place] [address]       Radiology Studies: No results found.   Scheduled Meds:  chlorhexidine  15 mL Mouth/Throat Daily   Chlorhexidine Gluconate Cloth  6 each Topical Daily   cloNIDine  0.2 mg Transdermal Weekly   darbepoetin (ARANESP) injection - NON-DIALYSIS  100 mcg Subcutaneous Q Thu-1800   dexamethasone (DECADRON) injection  6 mg Intravenous Q8H   feeding supplement (NEPRO CARB STEADY)  237 mL Oral TID   gentamicin cream  1 application Topical Daily   heparin injection (subcutaneous)  5,000 Units Subcutaneous Q8H   hydrALAZINE  25 mg Oral Q8H   insulin aspart  0-5 Units Subcutaneous QHS   insulin aspart  0-9 Units Subcutaneous TID WC   insulin glargine-yfgn  12 Units Subcutaneous Daily   metoprolol tartrate  25 mg Oral BID   multivitamin  1 tablet Oral QHS    pantoprazole (PROTONIX) IV  40 mg Intravenous Q24H   Continuous Infusions:  dialysis solution 1.5% low-MG/low-CA       LOS: 6 days     Enzo Bi, MD [facility] If 7PM-7AM, please contact night-coverage 10/24/2021, 6:40 PM

## 2021-10-24 NOTE — Progress Notes (Signed)
[facility]  ROUNDING NOTE   Subjective:   [name] was admitted to [facility] on 10/11/2021 for Facial swelling [R22.0] Angioedema, initial encounter [T78.3XXA] Fever, unspecified fever cause [R50.9] Angioedema [T78.3XXA]  Patient is known to our clinic and receives PD management at [facility], supervised by Dr Juleen China.   Patient seen sitting up in bed, alert and oriented States she is feeling much improved Tolerating nectar liquids  PD held overnight due to hypotension  Objective:  Vital signs in last 24 hours:  Temp:  [97.6 F (36.4 C)-98.4 F (36.9 C)] 97.9 F (36.6 C) (02/20 0733) Pulse Rate:  [58-72] 58 (02/20 0733) Resp:  [16-18] 16 (02/20 0733) BP: (129-178)/(49-84) 158/57 (02/20 0733) SpO2:  [99 %-100 %] 100 % (02/20 0733) Weight:  [60.6 kg] 60.6 kg (02/20 0543)  Weight change: 0.1 kg Filed Weights   10/22/21 1956 10/23/21 0553 10/24/21 0543  Weight: 60.5 kg 59.3 kg 60.6 kg    Intake/Output: I/O last 3 completed shifts: In: 17408 [P.O.:480; Other:10000; IV Piggyback:400] Out: 9920 [Other:9920]   Intake/Output this shift:  No intake/output data recorded.  Physical Exam: General: NAD  Head: Facial/ tongue swelling improved   Eyes: Anicteric  Lungs:  Clear to auscultation, normal effort  Heart: Regular rate and rhythm  Abdomen:  Soft, nontender  Extremities:  trace Dependent peripheral edema.  Neurologic: Alert and oriented  Skin: No lesions, generalized ecchymosis   Access: PD catheter    Basic Metabolic Panel: Recent Labs  Lab 10/20/21 0440 10/21/21 0424 10/22/21 0503 10/23/21 0445 10/24/21 0554 10/24/21 1040  NA 132* 133* 134* 132* 133*  --   K 3.3* 3.5 3.4* 3.1* 3.7  --   CL 95* 98 97* 97* 100  --   CO2 26 26 25 25 25   --   GLUCOSE 236* 304* 351* 364* 138*  --   BUN 59* 61* 64* 69* 78*  --   CREATININE 5.30* 5.13* 4.91* 4.93* 5.55*  --   CALCIUM 7.8* 7.7* 7.7* 7.6* 7.7*  --   MG 2.3 2.1 2.0 1.9 2.0 1.9  PHOS   --   --   --   --   --  5.8*     Liver Function Tests: No results for input(s): AST, ALT, ALKPHOS, BILITOT, PROT, ALBUMIN in the last 168 hours.  No results for input(s): LIPASE, AMYLASE in the last 168 hours. No results for input(s): AMMONIA in the last 168 hours.  CBC: Recent Labs  Lab 10/20/21 0440 10/21/21 0424 10/22/21 0503 10/23/21 0445 10/24/21 0554  WBC 16.4* 13.6* 13.2* 14.9* 24.7*  HGB 8.9* 8.0* 7.8* 7.4* 7.9*  HCT 27.6* 25.2* 24.9* 23.9* 25.9*  MCV 84.9 85.4 87.1 86.9 87.2  PLT 263 241 231 205 231     Cardiac Enzymes: No results for input(s): CKTOTAL, CKMB, CKMBINDEX, TROPONINI in the last 168 hours.  BNP: Invalid input(s): POCBNP  CBG: Recent Labs  Lab 10/23/21 1207 10/23/21 1622 10/23/21 2121 10/24/21 0022 10/24/21 0734  GLUCAP 357* 161* 149* 134* 143*     Microbiology: Results for orders placed or performed during the hospital encounter of 10/19/2021  Resp Panel by RT-PCR (Flu A&B, Covid) Nasopharyngeal Swab     Status: None   Collection Time: 10/20/2021  5:18 PM   Specimen: Nasopharyngeal Swab; Nasopharyngeal(NP) swabs in vial transport medium  Result Value Ref Range Status   SARS Coronavirus 2 by RT PCR NEGATIVE NEGATIVE Final    Comment: (NOTE) SARS-CoV-2 target nucleic acids are NOT  DETECTED.  The SARS-CoV-2 RNA is generally detectable in upper respiratory specimens during the acute phase of infection. The lowest concentration of SARS-CoV-2 viral copies this assay can detect is 138 copies/mL. A negative result does not preclude SARS-Cov-2 infection and should not be used as the sole basis for treatment or other patient management decisions. A negative result may occur with  improper specimen collection/handling, submission of specimen other than nasopharyngeal swab, presence of viral mutation(s) within the areas targeted by this assay, and inadequate number of viral copies(<138 copies/mL). A negative result must be combined with clinical  observations, patient history, and epidemiological information. The expected result is Negative.  Fact Sheet for Patients:  EntrepreneurPulse.com.au  Fact Sheet for Healthcare Providers:  IncredibleEmployment.be  This test is no t yet approved or cleared by the [place] FDA and  has been authorized for detection and/or diagnosis of SARS-CoV-2 by FDA under an Emergency Use Authorization (EUA). This EUA will remain  in effect (meaning this test can be used) for the duration of the COVID-19 declaration under Section 564(b)(1) of the Act, 21 U.S.C.section 360bbb-3(b)(1), unless the authorization is terminated  or revoked sooner.       Influenza A by PCR NEGATIVE NEGATIVE Final   Influenza B by PCR NEGATIVE NEGATIVE Final    Comment: (NOTE) The Xpert Xpress SARS-CoV-2/FLU/RSV plus assay is intended as an aid in the diagnosis of influenza from Nasopharyngeal swab specimens and should not be used as a sole basis for treatment. Nasal washings and aspirates are unacceptable for Xpert Xpress SARS-CoV-2/FLU/RSV testing.  Fact Sheet for Patients: EntrepreneurPulse.com.au  Fact Sheet for Healthcare Providers: IncredibleEmployment.be  This test is not yet approved or cleared by the [place] FDA and has been authorized for detection and/or diagnosis of SARS-CoV-2 by FDA under an Emergency Use Authorization (EUA). This EUA will remain in effect (meaning this test can be used) for the duration of the COVID-19 declaration under Section 564(b)(1) of the Act, 21 U.S.C. section 360bbb-3(b)(1), unless the authorization is terminated or revoked.  Performed at [facility], [address] [place], [place] [address]   Body fluid culture w Gram Stain     Status: None   Collection Time: 10/29/2021 11:40 PM   Specimen: Peritoneal Washings; Body Fluid  Result Value Ref Range Status   Specimen Description    Final    PERITONEAL DIALYSIS Performed at [facility], [address] [place], [place] [address]    Special Requests   Final    NONE Performed at [facility], [address] [place], [place] [address]    Gram Stain   Final    NO SQUAMOUS EPITHELIAL CELLS SEEN NO WBC SEEN NO ORGANISMS SEEN    Culture   Final    NO GROWTH 3 DAYS Performed at [facility], [place] [address] [place], [place] [address]    Report Status 10/20/2021 FINAL  Final  MRSA Next Gen by PCR, Nasal     Status: None   Collection Time: 10/18/21  1:40 PM   Specimen: Nasal Mucosa; Nasal Swab  Result Value Ref Range Status   MRSA by PCR Next Gen NOT DETECTED NOT DETECTED Final    Comment: (NOTE) The GeneXpert MRSA Assay (FDA approved for NASAL specimens only), is one component of a comprehensive MRSA colonization surveillance program. It is not intended to diagnose MRSA infection nor to guide or monitor treatment for MRSA infections. Test performance is not FDA approved in patients less than  16 years old. Performed at [facility], [address] [place], [place] [address]     Coagulation Studies: No results for input(s): LABPROT, INR in the last 72 hours.   Urinalysis: No results for input(s): COLORURINE, LABSPEC, PHURINE, GLUCOSEU, HGBUR, BILIRUBINUR, KETONESUR, PROTEINUR, UROBILINOGEN, NITRITE, LEUKOCYTESUR in the last 72 hours.  Invalid input(s): APPERANCEUR    Imaging: No results found.   Medications:    sodium chloride 30 mL/hr at 10/23/21 6045   dialysis solution 1.5% low-MG/low-CA     feeding supplement (OSMOLITE 1.2 CAL)      chlorhexidine  15 mL Mouth/Throat Daily   Chlorhexidine Gluconate Cloth  6 each Topical Daily   cloNIDine  0.2 mg Transdermal Weekly   darbepoetin (ARANESP) injection - NON-DIALYSIS  100 mcg Subcutaneous Q Thu-1800   dexamethasone (DECADRON) injection  6 mg Intravenous Q8H   feeding supplement (NEPRO CARB STEADY)  237 mL Oral  TID   free water  80 mL Per Tube Q4H   gentamicin cream  1 application Topical Daily   heparin injection (subcutaneous)  5,000 Units Subcutaneous Q8H   hydrALAZINE  25 mg Oral Q8H   insulin aspart  0-5 Units Subcutaneous QHS   insulin aspart  0-9 Units Subcutaneous TID WC   insulin glargine-yfgn  12 Units Subcutaneous Daily   metoprolol tartrate  25 mg Oral BID   pantoprazole (PROTONIX) IV  40 mg Intravenous Q24H   acetaminophen **OR** acetaminophen, benzocaine, dianeal solution for CAPD/CCPD with heparin, hydrALAZINE, ondansetron **OR** ondansetron (ZOFRAN) IV  Assessment/ Plan:  [name] is a 81 y.o. white female with end stage renal disease on peritoneal dialysis, diabetes mellitus type II insulin dependent, hypertension, and hyperlipidemia who is admitted to [facility] on 10/18/2021 for Facial swelling [R22.0] Angioedema, initial encounter [T78.3XXA] Fever, unspecified fever cause [R50.9] Angioedema [T78.3XXA]  [facility] 59kg CCPD 5 exchanges 2 liter fills 9 hours  End Stage Renal Disease:  Currently receiving peritoneal dialysis, held overnight due to hypotension.  Will resume treatment tonight. Patient will receive rehab after discharge, rehab facilities would not permit peritoneal dialysis.  Patient agreeable to transition to hemodialysis during rehab stay. We will consult vascular for placement of PermCath tomorrow. Dialysis coordinator aware of this plan and currently seeking outpatient clinic.  02/19 0701 - 02/20 0700 In: 40981 [P.O.:480; IV Piggyback:400] Out: 9920    Hypertension:   ACE inhibitor and ARB initially held due to angioedema.  Blood pressure reportedly soft yesterday, but has recovered to 158/57.  Patient currently receiving clonidine, hydroxylate, and metoprolol.  Anemia of chronic kidney disease:  Mircera as outpatient given 09/27/2021.    Lab Results  Component Value Date   HGB 7.9 (L) 10/24/2021  Aranesp ordered  weekly (Thursday) Hemoglobin slowly improving, 7.9 today  4.  Angioedema, believed due to prescribed ACE-I.   Transferred ourt of ICU on 10/20/21.  Continues receiving dexamethasone  Edema greatly improved, diet advanced  5.  Hypokalemia,  Potassium level 3.7 today. KCL 40 meq IV today.    LOS: 6 Travonne Schowalter 2/20/202311:59 AM

## 2021-10-24 NOTE — Progress Notes (Signed)
Spoke with patient and granddaughter this morning about SNF placement and what that means for Peritoneal Dialysis patients. Both are aware that if patient discharges to a SNF that patient will have to transition to hemodialysis. Discussed and educated on in-center hemodialysis, CVC care and changes to a more strict renal diet. Please contact me with any dialysis placement concerns.

## 2021-10-24 NOTE — Consult Note (Signed)
[facility] Vascular Consult Note  MRN : [id number]  Catherine Burke is a 81 y.o. (01/19/1941) female who presents with chief complaint of  Chief Complaint  Patient presents with   Facial Swelling  .  History of Present Illness: We are asked to consult by Colon Flattery. Catherine Burke is an 81 year old female that is currently on peritoneal dialysis however was recently admitted to [facility] due to angioedema.  The patient's angioedema has greatly improved however she will need to go to a rehab facility before she can return home.  Unfortunately, rehab facilities do not allow peritoneal dialysis.  Current Facility-Administered Medications  Medication Dose Route Frequency Provider Last Rate Last Admin   acetaminophen (TYLENOL) tablet 650 mg  650 mg Oral Q6H PRN Cox, Amy N, DO       Or   acetaminophen (TYLENOL) suppository 650 mg  650 mg Rectal Q6H PRN Cox, Amy N, DO       benzocaine (ORAJEL) 10 % mucosal gel   Mouth/Throat TID PRN Colon Flattery, NP   Given at 10/21/21 1333   chlorhexidine (PERIDEX) 0.12 % solution 15 mL  15 mL Mouth/Throat Daily Enzo Bi, MD   15 mL at 10/24/21 3329   Chlorhexidine Gluconate Cloth 2 % PADS 6 each  6 each Topical Daily Ralene Muskrat B, MD   6 each at 10/24/21 5188   cloNIDine (CATAPRES - Dosed in mg/24 hr) patch 0.2 mg  0.2 mg Transdermal Weekly Priscella Mann, Sudheer B, MD   0.2 mg at 10/18/21 1809   Darbepoetin Alfa (ARANESP) injection 100 mcg  100 mcg Subcutaneous Q Thu-1800 Murlean Iba, MD   100 mcg at 10/20/21 2311   dexamethasone (DECADRON) injection 6 mg  6 mg Intravenous Azzie Roup, MD   6 mg at 10/24/21 4166   dialysis solution 1.5% low-MG/low-CA dianeal solution   Intraperitoneal Q24H Kolluru, Sarath, MD   6,000 mL at 10/21/21 2020   feeding supplement (NEPRO CARB STEADY) liquid 237 mL  237 mL Oral TID Enzo Bi, MD   237 mL at 10/24/21 0824   gentamicin cream (GARAMYCIN) 0.1 % 1 application   1 application Topical Daily Kolluru, Sarath, MD   1 application at 03/04/15 1234   heparin 3,000 Units in dialysis solution 1.5% low-MG/low-CA 6,000 mL dialysis solution   Peritoneal Dialysis PRN Pearla Dubonnet, RPH   Given at 10/21/21 2021   heparin injection 5,000 Units  5,000 Units Subcutaneous Q8H Enzo Bi, MD   5,000 Units at 10/24/21 0521   hydrALAZINE (APRESOLINE) injection 10 mg  10 mg Intravenous Q4H PRN Ralene Muskrat B, MD   10 mg at 10/23/21 0948   hydrALAZINE (APRESOLINE) tablet 25 mg  25 mg Oral Q8H Enzo Bi, MD   25 mg at 10/24/21 0835   insulin aspart (novoLOG) injection 0-5 Units  0-5 Units Subcutaneous QHS Enzo Bi, MD   3 Units at 10/22/21 2228   insulin aspart (novoLOG) injection 0-9 Units  0-9 Units Subcutaneous TID WC Enzo Bi, MD   3 Units at 10/24/21 1234   insulin glargine-yfgn [facility]) injection 12 Units  12 Units Subcutaneous Daily Enzo Bi, MD   12 Units at 10/24/21 0825   metoprolol tartrate (LOPRESSOR) 25 mg/10 mL oral suspension 25 mg  25 mg Oral BID Enzo Bi, MD       multivitamin (RENA-VIT) tablet 1 tablet  1 tablet Oral QHS Enzo Bi, MD       ondansetron [facility]) tablet  4 mg  4 mg Oral Q6H PRN Cox, Amy N, DO       Or   ondansetron (ZOFRAN) injection 4 mg  4 mg Intravenous Q6H PRN Cox, Amy N, DO       pantoprazole (PROTONIX) injection 40 mg  40 mg Intravenous Q24H Ralene Muskrat B, MD   40 mg at 10/23/21 1506    Past Medical History:  Diagnosis Date   Anemia    Arthritis    B12 deficiency 04/04/2021   Diabetes mellitus    GERD (gastroesophageal reflux disease)    HLD (hyperlipidemia)    Hypercholesterolemia    Hypertension    Kidney stones    Osteoporosis    Proteinuria     Past Surgical History:  Procedure Laterality Date   ABDOMINAL HYSTERECTOMY     BACK SURGERY     lumbar   BREAST CYST ASPIRATION     unsure of laterality    CAPD INSERTION N/A 02/13/2018   Procedure: LAPAROSCOPIC INSERTION CONTINUOUS AMBULATORY PERITONEAL DIALYSIS   (CAPD) CATHETER;  Surgeon: Algernon Huxley, MD;  Location: [facility] ORS;  Service: Vascular;  Laterality: N/A;   CAPD INSERTION N/A 07/21/2021   Procedure: LAPAROSCOPIC INSERTION CONTINUOUS AMBULATORY PERITONEAL DIALYSIS  (CAPD) CATHETER;  Surgeon: Ronny Bacon, MD;  Location: [facility] ORS;  Service: General;  Laterality: N/A;   CAPD REMOVAL N/A 07/21/2021   Procedure: REMOVAL CONTINUOUS AMBULATORY PERITONEAL DIALYSIS  (CAPD) CATHETER;  Surgeon: Ronny Bacon, MD;  Location: [facility] ORS;  Service: General;  Laterality: N/A;   CHOLECYSTECTOMY     EYE SURGERY     bilateral cataract    NASAL SEPTUM SURGERY      Social History Social History   Tobacco Use   Smoking status: Never   Smokeless tobacco: Never  Vaping Use   Vaping Use: Never used  Substance Use Topics   Alcohol use: No    Alcohol/week: 0.0 standard drinks   Drug use: No    Family History Family History  Problem Relation Age of Onset   Other Mother    Heart attack Father    Diabetes Maternal Grandmother    Breast cancer Paternal Aunt     Allergies  Allergen Reactions   Ace Inhibitors Swelling   Levofloxacin Other (See Comments)    Throat Swelling    Penicillins Anaphylaxis   Other    Penicillin G Other (See Comments)   Sulfa Antibiotics      REVIEW OF SYSTEMS (Negative unless checked)  Constitutional: [] Weight loss  [] Fever  [] Chills Cardiac: [] Chest pain   [] Chest pressure   [] Palpitations   [] Shortness of breath when laying flat   [] Shortness of breath at rest   [] Shortness of breath with exertion. Vascular:  [] Pain in legs with walking   [] Pain in legs at rest   [] Pain in legs when laying flat   [] Claudication   [] Pain in feet when walking  [] Pain in feet at rest  [] Pain in feet when laying flat   [] History of DVT   [] Phlebitis   [] Swelling in legs   [] Varicose veins   [] Non-healing ulcers Pulmonary:   [] Uses home oxygen   [] Productive cough   [] Hemoptysis   [] Wheeze  [] COPD   [] Asthma Neurologic:  [] Dizziness   [] Blackouts   [] Seizures   [] History of stroke   [] History of TIA  [] Aphasia   [] Temporary blindness   [] Dysphagia   [] Weakness or numbness in arms   [] Weakness or numbness in legs Musculoskeletal:  [] Arthritis   []   Joint swelling   [] Joint pain   [] Low back pain Hematologic:  [] Easy bruising  [] Easy bleeding   [] Hypercoagulable state   [] Anemic  [] Hepatitis Gastrointestinal:  [] Blood in stool   [] Vomiting blood  [] Gastroesophageal reflux/heartburn   [] Difficulty swallowing. Genitourinary:  [] Chronic kidney disease   [] Difficult urination  [] Frequent urination  [] Burning with urination   [] Blood in urine Skin:  [] Rashes   [] Ulcers   [] Wounds Psychological:  [] History of anxiety   []  History of major depression.  Physical Examination  Vitals:   10/24/21 0030 10/24/21 0520 10/24/21 0543 10/24/21 0733  BP: (!) 129/55 (!) 178/84  (!) 158/57  Pulse: 65 71  (!) 58  Resp: 18 18  16   Temp: 98.3 F (36.8 C) 97.6 F (36.4 C)  97.9 F (36.6 C)  TempSrc: Oral Oral  Oral  SpO2: 99% 99%  100%  Weight:   60.6 kg   Height:       Body mass index is 22.93 kg/m. Gen:  WD/WN, NAD Head: [place]/AT, No temporalis wasting. Prominent temp pulse not noted. Neurologic: Sensation grossly intact in extremities.  Symmetrical.  Speech is fluent. Motor exam as listed above. Psychiatric: Judgment intact, Mood & affect appropriate for pt's clinical situation.    CBC Lab Results  Component Value Date   WBC 24.7 (H) 10/24/2021   HGB 7.9 (L) 10/24/2021   HCT 25.9 (L) 10/24/2021   MCV 87.2 10/24/2021   PLT 231 10/24/2021    BMET    Component Value Date/Time   NA 133 (L) 10/24/2021 0554   K 3.7 10/24/2021 0554   CL 100 10/24/2021 0554   CO2 25 10/24/2021 0554   GLUCOSE 138 (H) 10/24/2021 0554   BUN 78 (H) 10/24/2021 0554   CREATININE 5.55 (H) 10/24/2021 0554   CREATININE 0.89 06/02/2015 1027   CALCIUM 7.7 (L) 10/24/2021 0554   CALCIUM 7.5 (L) 10/12/2020 1535   GFRNONAA 7 (L) 10/24/2021 0554   GFRNONAA  69 06/30/2014 1055   GFRAA 13 (L) 02/11/2018 1334   GFRAA 79 06/30/2014 1055   Estimated Creatinine Clearance: 6.7 mL/min (A) (by C-G formula based on SCr of 5.55 mg/dL (H)).  COAG Lab Results  Component Value Date   INR 1.3 (H) 10/26/2021   INR 1.2 10/15/2021   INR 0.86 02/11/2018    Radiology CT ABDOMEN PELVIS WO CONTRAST  Result Date: 10/13/2021 CLINICAL DATA:  Epigastric abdominal pain. EXAM: CT ABDOMEN AND PELVIS WITHOUT CONTRAST TECHNIQUE: Multidetector CT imaging of the abdomen and pelvis was performed following the standard protocol without IV contrast. RADIATION DOSE REDUCTION: This exam was performed according to the departmental dose-optimization program which includes automated exposure control, adjustment of the mA and/or kV according to patient size and/or use of iterative reconstruction technique. COMPARISON:  Radiograph of same day. FINDINGS: Lower chest: Small bilateral pleural effusions are noted with minimal adjacent subsegmental atelectasis. Hepatobiliary: Status post cholecystectomy. No biliary dilatation is noted. Slightly nodular hepatic contours are noted suggesting possible hepatic cirrhosis. Pancreas: Unremarkable. No pancreatic ductal dilatation or surrounding inflammatory changes. Spleen: Normal in size without focal abnormality. Adrenals/Urinary Tract: Adrenal glands appear normal. Left renal atrophy is noted. Right renal cysts are noted. No hydronephrosis or renal obstruction is noted. Urinary bladder is unremarkable. 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 Stomach/Bowel: Stomach is within normal limits. Appendix appears normal. No evidence of bowel wall thickening, distention, or inflammatory changes. Vascular/Lymphatic: Aortic atherosclerosis. No enlarged abdominal or pelvic lymph nodes. Reproductive: Status post hysterectomy. No adnexal masses.  Other: Peritoneal dialysis catheter is noted with coiled tip seen in the pelvis. Small amount of free fluid is  noted in the pelvis. There is also noted moderate pneumoperitoneum in the epigastric region as noted on prior radiograph of same day. This could be related to the presence of the dialysis catheter. Musculoskeletal: No acute or significant osseous findings. IMPRESSION: Moderate pneumoperitoneum is noted in the epigastric region as noted on radiograph of same day. This most likely is due to the presence of peritoneal dialysis catheter. The possibility of rupture of hollow viscus cannot be excluded, although there are no other signs to suggest bowel perforation on this study. These results were called by telephone at the time of interpretation on 10/13/2021 at 2:24 pm to provider [facility] , who verbally acknowledged these results. 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 Small bilateral pleural effusions with adjacent subsegmental atelectasis. Aortic Atherosclerosis (ICD10-I70.0). Electronically Signed   By: Marijo Conception M.D.   On: 10/13/2021 14:25   DG Chest 2 View  Result Date: 10/13/2021 CLINICAL DATA:  Chest pain beginning yesterday. Shortness of breath. Nausea. EXAM: CHEST - 2 VIEW COMPARISON:  04/25/2021 FINDINGS: Free intraperitoneal air is seen beneath both hemidiaphragms. Mild cardiomegaly stable. Mild scarring is seen in both lung bases. No evidence of acute infiltrate or pulmonary edema. Stable mild thoracic dextroscoliosis. IMPRESSION: Free intraperitoneal air. This may be due to peritoneal dialysis, although perforated viscus cannot be excluded. Recommend clinical correlation, and consider abdomen pelvis CT if warranted. Mild cardiomegaly and bibasilar scarring. No active lung disease. Critical Value/emergent results were called by telephone at the time of interpretation on 10/13/2021 at 12:24 pm to provider [facility] , who verbally acknowledged these results. Electronically Signed   By: Marlaine Hind M.D.   On: 10/13/2021 12:28   CT Soft Tissue Neck Wo  Contrast  Result Date: 10/24/2021 CLINICAL DATA:  Right neck/facial swelling, pain, and sore throat. EXAM: CT NECK WITHOUT CONTRAST TECHNIQUE: Multidetector CT imaging of the neck was performed following the standard protocol without intravenous contrast. RADIATION DOSE REDUCTION: This exam was performed according to the departmental dose-optimization program which includes automated exposure control, adjustment of the mA and/or kV according to patient size and/or use of iterative reconstruction technique. COMPARISON:  Chest CTA 10/13/2021. FINDINGS: Pharynx and larynx: Moderate low-density soft tissue swelling at the level of the hypopharynx bilaterally effacing the para form sinuses, new from the recent chest CTA. Mild narrowing of the lower oropharyngeal and supraglottic airway. No retropharyngeal fluid collection. No evidence of significant edema involving the epiglottis or floor of mouth. Salivary glands: No inflammation, mass, or stone. Thyroid: Thyroid nodules measuring up to 1.4 cm as seen on the recent chest CTA and for which no follow-up imaging is recommended. Lymph nodes: No enlarged or suspicious lymph nodes in the neck. Vascular: Calcified atherosclerosis at the carotid bifurcations. Limited intracranial: Proximally 2 cm partially calcified mass along the right sphenoid wing, incompletely imaged. Visualized orbits: Bilateral cataract extraction. Mastoids and visualized paranasal sinuses: Mild mucosal thickening/small mucous retention cysts in the left greater than right maxillary sinuses. Trace right mastoid fluid. Skeleton: Moderate cervical disc and facet degeneration. Upper chest: Mild biapical pleuroparenchymal lung scarring. Other: Moderate to prominent right lower facial soft tissue swelling. No drainable fluid collection identified within limitations of noncontrast technique and dental streak artifact. IMPRESSION: 1. Right lower facial soft tissue swelling, possibly cellulitis. No drainable  fluid collection identified. 2. Moderate hypopharyngeal swelling, new from 10/13/2021 and  of uncertain etiology though possibly infectious or allergic. 3. 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 Consider nonemergent contrast enhanced brain MRI for further evaluation. Electronically Signed   By: Logan Bores M.D.   On: 10/18/2021 17:18   CT Angio Chest Pulmonary Embolism (PE) W or WO Contrast  Result Date: 10/13/2021 CLINICAL DATA:  Generalized chest pain. Shortness of breath. Nausea. EXAM: CT ANGIOGRAPHY CHEST WITH CONTRAST TECHNIQUE: Multidetector CT imaging of the chest was performed using the standard protocol during bolus administration of intravenous contrast. Multiplanar CT image reconstructions and MIPs were obtained to evaluate the vascular anatomy. RADIATION DOSE REDUCTION: This exam was performed according to the departmental dose-optimization program which includes automated exposure control, adjustment of the mA and/or kV according to patient size and/or use of iterative reconstruction technique. CONTRAST:  21mL OMNIPAQUE IOHEXOL 350 MG/ML SOLN COMPARISON:  Chest radiograph and abdominal CT of earlier today. Chest CT of 02/11/2019 FINDINGS: Cardiovascular: The quality of this exam for evaluation of pulmonary embolism is moderate. The bolus is relatively well timed. There is mild motion degradation inferiorly. No pulmonary embolism to the large segmental level. Aortic atherosclerosis. Mild cardiomegaly, without pericardial effusion. Mediastinum/Nodes: Multiple small bilateral thyroid nodules including at up to 1.4 cm. Not clinically significant; no follow-up imaging recommended (ref: J Am Coll Radiol. 2015 Feb;12(2): 143-50) No mediastinal or hilar adenopathy. Small hiatal hernia. Lungs/Pleura: Trace bilateral pleural effusions. Pleural-based right upper and right middle lobe pulmonary nodules of maximally 4 mm are similar and likely subpleural lymph  nodes. Mild dependent bibasilar atelectasis. Upper Abdomen: Free intraperitoneal air, as detailed on today's dedicated abdominal CT. Other abdominal findings deferred to that exam. Musculoskeletal: Osteopenia. Review of the MIP images confirms the above findings. IMPRESSION: 1. No pulmonary embolism with limitations above. 2.  No acute process in the chest. 3. Free intraperitoneal air, as on today's abdominal CT. 4. Small bilateral pleural effusions. 5.  Aortic Atherosclerosis (ICD10-I70.0). 6. Small hiatal hernia. Electronically Signed   By: Abigail Miyamoto M.D.   On: 10/13/2021 20:10   US Venous Img Upper Uni Right(DVT)  Result Date: 10/22/2021 CLINICAL DATA:  Right arm swelling EXAM: RIGHT UPPER EXTREMITY VENOUS DOPPLER ULTRASOUND TECHNIQUE: Gray-scale sonography with graded compression, as well as color Doppler and duplex ultrasound were performed to evaluate the upper extremity deep venous system from the level of the subclavian vein and including the jugular, axillary, basilic, radial, ulnar and upper cephalic vein. Spectral Doppler was utilized to evaluate flow at rest and with distal augmentation maneuvers. COMPARISON:  None. FINDINGS: Contralateral Subclavian Vein: Respiratory phasicity is normal and symmetric with the symptomatic side. No evidence of thrombus. Normal compressibility. Internal Jugular Vein: No evidence of thrombus. Normal compressibility, respiratory phasicity and response to augmentation. Subclavian Vein: No evidence of thrombus. Normal compressibility, respiratory phasicity and response to augmentation. Axillary Vein: No evidence of thrombus. Normal compressibility, respiratory phasicity and response to augmentation. Cephalic Vein: No evidence of thrombus. Normal compressibility, respiratory phasicity and response to augmentation. Basilic Vein: No evidence of thrombus. Normal compressibility, respiratory phasicity and response to augmentation. Brachial Veins: No evidence of thrombus.  Normal compressibility, respiratory phasicity and response to augmentation. Radial Veins: No evidence of thrombus. Normal compressibility, respiratory phasicity and response to augmentation. Ulnar Veins: No evidence of thrombus. Normal compressibility, respiratory phasicity and response to augmentation. Venous Reflux:  None visualized. Other Findings:  None visualized. IMPRESSION: No evidence of DVT within the right upper extremity. Electronically Signed   By: Elta Guadeloupe  Lukens M.D.   On: 10/22/2021 03:39   DG CHEST PORT 1 VIEW  Result Date: 10/19/2021 CLINICAL DATA:  New onset atrial fibrillation EXAM: PORTABLE CHEST 1 VIEW COMPARISON:  August 17, 2022 FINDINGS: The heart size and mediastinal contours are stable. Aortic atherosclerosis. Pulmonary vascular congestion. No overt pulmonary edema or focal airspace consolidation. No visible pleural effusion or pneumothorax. The visualized skeletal structures are unchanged. IMPRESSION: Pulmonary vascular congestion without overt pulmonary edema or focal airspace consolidation. Electronically Signed   By: Dahlia Bailiff M.D.   On: 10/19/2021 08:06   DG Chest Port 1 View  Result Date: 10/18/2021 CLINICAL DATA:  Hypoxia. EXAM: PORTABLE CHEST 1 VIEW COMPARISON:  10/14/2021 FINDINGS: Stable cardiomediastinal contours. Aortic atherosclerosis noted. Pulmonary vascular congestion. No pleural effusion or airspace consolidation. IMPRESSION: Pulmonary vascular congestion. Electronically Signed   By: Kerby Moors M.D.   On: 10/18/2021 09:26   DG Chest Port 1 View  Result Date: 10/27/2021 CLINICAL DATA:  Possible sepsis. EXAM: PORTABLE CHEST 1 VIEW COMPARISON:  10/15/2021 FINDINGS: Artifact overlies the chest. Heart size is normal. There is aortic atherosclerosis. The lungs are clear. No heart failure. No acute finding. IMPRESSION: No active disease. Electronically Signed   By: Nelson Chimes M.D.   On: 10/30/2021 16:57   DG Chest Port 1 View  Result Date:  10/15/2021 CLINICAL DATA:  Fever.  Possible sepsis. EXAM: PORTABLE CHEST 1 VIEW COMPARISON:  Chest radiograph dated 10/13/2021. FINDINGS: Minimal left lung base atelectasis similar or improved since the prior radiograph. Trace left pleural effusion. No new consolidation. No pneumothorax. The cardiac silhouette is within limits. Atherosclerotic calcification of the aorta. Osteopenia with degenerative changes of the spine. No acute osseous pathology. Minimal pneumoperitoneum along the right hemidiaphragm, decreased since the prior study. IMPRESSION: 1. Minimal left lung base atelectasis similar or improved since the prior radiograph. Trace left pleural effusion. 2. Minimal residual pneumoperitoneum under the right hemidiaphragm. Electronically Signed   By: Anner Crete M.D.   On: 10/15/2021 22:18   DG ABD ACUTE 2+V W 1V CHEST  Result Date: 10/14/2021 CLINICAL DATA:  Abdominal pain EXAM: DG ABDOMEN ACUTE WITH 1 VIEW CHEST COMPARISON:  10/13/2021 CT abdomen/pelvis FINDINGS: Stable cardiomediastinal silhouette with normal heart size. No pneumothorax. No pleural effusion. No overt pulmonary edema. Mild smooth is lateral basilar right pleural thickening, unchanged. Streaky linear mild bibasilar atelectasis is similar. No acute consolidative airspace disease. Free air under the right hemidiaphragm is slightly decreased. Previously visualized free air under the left hemidiaphragm has resolved. Peritoneal dialysis catheter terminates over the deep pelvis to the right of midline. No disproportionately dilated small bowel loops. Moderate gas and mild stool throughout the large bowel, most prominent in the sigmoid colon. Cholecystectomy clips are seen in the right upper quadrant of the abdomen. No radiopaque nephrolithiasis. IMPRESSION: 1. Decreased free air under the right hemidiaphragm, as noted on imaging from 1 day prior, favored to be due to peritoneal dialysis catheter. 2. Nonobstructive bowel gas pattern. 3.  Moderate gas and mild stool throughout the large bowel, most prominent in the sigmoid colon. 4. Mild bibasilar atelectasis. No acute cardiopulmonary disease. Electronically Signed   By: Ilona Sorrel M.D.   On: 10/14/2021 08:33      Assessment/Plan End-stage renal disease Currently the patient is on peritoneal dialysis however she will be going to a rehab facility at discharge.  Unfortunately, rehab facilities do not allow for peritoneal dialysis and she would need a PermCath for hemodialysis.  It was explained to the patient  that once she is able to return home she can resume peritoneal dialysis and the PermCath can be removed.  We discussed the risk, benefits and alternatives of placement of PermCath.  The patient agrees to proceed.   Kris Hartmann, NP  10/24/2021 1:49 PM    This note was created with Dragon medical transcription system.  Any error is purely unintentional

## 2021-10-24 NOTE — Progress Notes (Signed)
Occupational Therapy Treatment Patient Details Name: Catherine Burke MRN: [id number] DOB: [birth year] Today's Date: 10/24/2021   History of present illness Pt is an 81 y/o F admitted 10/27/2021 after presenting to the ED with concerns of R facial swelling. Facial swelling thought to be due to angioedema 2/2 ACE inhibitor use. PMH: ESRD on PD, HTN, IDDM, HLD   OT comments  Upon entering the room, pt seated in recliner chair and RN requesting assistance transferring pt back to bed. Pt needing max A to stand from recliner chair and then mod stand step pivot to the L to bed. Pt reporting fatigue this session and min A to return to bed. Pt rolling with min A to the R and placed into slide lying per her request. Pt also reporting decreased strength in B hands and difficulty holding utensil. OT provided pt with built up utensil handle and demonstrated its use with pt returning demonstration. Item left in room for pt to utilize for meals to increase ease with task. All needs within reach and family present in the room. Bed alarm activated and call bell within reach. OT continues to recommend short term rehab to address functional deficits before returning home.    Recommendations for follow up therapy are one component of a multi-disciplinary discharge planning process, led by the attending physician.  Recommendations may be updated based on patient status, additional functional criteria and insurance authorization.    Follow Up Recommendations  Skilled nursing-short term rehab (<3 hours/day)    Assistance Recommended at Discharge Frequent or constant Supervision/Assistance  Patient can return home with the following  A lot of help with walking and/or transfers;A lot of help with bathing/dressing/bathroom;Assistance with cooking/housework;Direct supervision/assist for medications management;Direct supervision/assist for financial management;Assist for transportation;Help with stairs or ramp for entrance    Equipment Recommendations  Other (comment) (defer to next venue of care)       Precautions / Restrictions Precautions Precautions: Fall Precaution Comments: peritoneal dialysis catheter in abdomen Restrictions Weight Bearing Restrictions: No       Mobility Bed Mobility Overal bed mobility: Needs Assistance Bed Mobility: Sit to Supine     Supine to sit: HOB elevated Sit to supine: Min assist   General bed mobility comments: for LEs    Transfers Overall transfer level: Needs assistance Equipment used: Rolling walker (2 wheels) Transfers: Sit to/from Stand, Bed to chair/wheelchair/BSC Sit to Stand: Max assist     Step pivot transfers: Mod assist     General transfer comment: max A to stand from recliner chair     Balance Overall balance assessment: Needs assistance Sitting-balance support: Feet supported Sitting balance-Leahy Scale: Fair Sitting balance - Comments: supervision static sitting   Standing balance support: Bilateral upper extremity supported, During functional activity Standing balance-Leahy Scale: Poor                             ADL either performed or assessed with clinical judgement    Extremity/Trunk Assessment Upper Extremity Assessment Upper Extremity Assessment: Generalized weakness   Lower Extremity Assessment Lower Extremity Assessment: Generalized weakness        Vision Patient Visual Report: No change from baseline            Cognition Arousal/Alertness: Awake/alert Behavior During Therapy: WFL for tasks assessed/performed Overall Cognitive Status: Within Functional Limits for tasks assessed  General Comments: Sweet lady                   Pertinent Vitals/ Pain       Pain Assessment Pain Assessment: Faces Faces Pain Scale: Hurts a little bit Pain Location: generalized Pain Descriptors / Indicators: Discomfort Pain Intervention(s): Limited activity within  patient's tolerance, Monitored during session, Repositioned         Frequency  Min 2X/week        Progress Toward Goals  OT Goals(current goals can now be found in the care plan section)  Progress towards OT goals: Progressing toward goals  Acute Rehab OT Goals Patient Stated Goal: to get into bed OT Goal Formulation: With patient/family Time For Goal Achievement: 11/05/21 Potential to Achieve Goals: Good  Plan Discharge plan remains appropriate;Frequency remains appropriate       AM-PAC OT "6 Clicks" Daily Activity     Outcome Measure   Help from another person eating meals?: None Help from another person taking care of personal grooming?: A Little Help from another person toileting, which includes using toliet, bedpan, or urinal?: A Lot Help from another person bathing (including washing, rinsing, drying)?: A Lot Help from another person to put on and taking off regular upper body clothing?: A Little Help from another person to put on and taking off regular lower body clothing?: A Lot 6 Click Score: 16    End of Session Equipment Utilized During Treatment: Rolling walker (2 wheels)  OT Visit Diagnosis: Unsteadiness on feet (R26.81);Muscle weakness (generalized) (M62.81)   Activity Tolerance Patient tolerated treatment well   Patient Left in bed;with call bell/phone within reach;with bed alarm set;with family/visitor present   Nurse Communication Mobility status        Time: 0086-7619 OT Time Calculation (min): 13 min  Charges: OT General Charges $OT Visit: 1 Visit OT Treatments $Self Care/Home Management : 8-22 mins  Darleen Crocker, MS, OTR/L , CBIS ascom [phone]  10/24/21, 3:22 PM

## 2021-10-25 ENCOUNTER — Other Ambulatory Visit (INDEPENDENT_AMBULATORY_CARE_PROVIDER_SITE_OTHER): Payer: Self-pay | Admitting: Nurse Practitioner

## 2021-10-25 ENCOUNTER — Encounter: Admission: EM | Disposition: E | Payer: Self-pay | Source: Home / Self Care | Attending: Internal Medicine

## 2021-10-25 DIAGNOSIS — T783XXA Angioneurotic edema, initial encounter: Secondary | ICD-10-CM | POA: Diagnosis not present

## 2021-10-25 DIAGNOSIS — K148 Other diseases of tongue: Secondary | ICD-10-CM

## 2021-10-25 LAB — GLUCOSE, CAPILLARY
Glucose-Capillary: 291 mg/dL — ABNORMAL HIGH (ref 70–99)
Glucose-Capillary: 255 mg/dL — ABNORMAL HIGH (ref 70–99)
Glucose-Capillary: 297 mg/dL — ABNORMAL HIGH (ref 70–99)
Glucose-Capillary: 177 mg/dL — ABNORMAL HIGH (ref 70–99)
Glucose-Capillary: 185 mg/dL — ABNORMAL HIGH (ref 70–99)

## 2021-10-25 LAB — CBC
HCT: 26 % — ABNORMAL LOW (ref 36.0–46.0)
Hemoglobin: 8.3 g/dL — ABNORMAL LOW (ref 12.0–15.0)
MCH: 27.7 pg (ref 26.0–34.0)
MCHC: 31.9 g/dL (ref 30.0–36.0)
MCV: 86.7 fL (ref 80.0–100.0)
Platelets: 259 10*3/uL (ref 150–400)
RBC: 3 MIL/uL — ABNORMAL LOW (ref 3.87–5.11)
RDW: 16.9 % — ABNORMAL HIGH (ref 11.5–15.5)
WBC: 22.7 10*3/uL — ABNORMAL HIGH (ref 4.0–10.5)
nRBC: 0.4 % — ABNORMAL HIGH (ref 0.0–0.2)

## 2021-10-25 LAB — HEPATITIS B CORE ANTIBODY, TOTAL: Hep B Core Total Ab: NONREACTIVE

## 2021-10-25 LAB — BASIC METABOLIC PANEL
Anion gap: 11 (ref 5–15)
BUN: 76 mg/dL — ABNORMAL HIGH (ref 8–23)
CO2: 23 mmol/L (ref 22–32)
Calcium: 7.8 mg/dL — ABNORMAL LOW (ref 8.9–10.3)
Chloride: 98 mmol/L (ref 98–111)
Creatinine, Ser: 5.3 mg/dL — ABNORMAL HIGH (ref 0.44–1.00)
GFR, Estimated: 8 mL/min — ABNORMAL LOW (ref >60)
Glucose, Bld: 341 mg/dL — ABNORMAL HIGH (ref 70–99)
Potassium: 3.8 mmol/L (ref 3.5–5.1)
Sodium: 132 mmol/L — ABNORMAL LOW (ref 135–145)

## 2021-10-25 LAB — HEPATITIS B SURFACE ANTIGEN: Hepatitis B Surface Ag: NONREACTIVE

## 2021-10-25 LAB — MAGNESIUM: Magnesium: 1.8 mg/dL (ref 1.7–2.4)

## 2021-10-25 LAB — PHOSPHORUS: Phosphorus: 5.2 mg/dL — ABNORMAL HIGH (ref 2.5–4.6)

## 2021-10-25 SURGERY — INVASIVE LAB ABORTED CASE

## 2021-10-25 MED ORDER — SODIUM CHLORIDE 0.9 % IV SOLN: INTRAVENOUS | Status: DC

## 2021-10-25 MED ORDER — CLINDAMYCIN PHOSPHATE 300 MG/50ML IV SOLN
300.0000 mg | Freq: Once | INTRAVENOUS | Status: DC
  Filled 2021-10-25: qty 50

## 2021-10-25 MED ORDER — CHLORHEXIDINE GLUCONATE 0.12 % MT SOLN
15.0000 mL | Freq: Four times a day (QID) | OROMUCOSAL | Status: DC
  Administered 2021-10-25 – 2021-11-03 (×27): 15 mL via OROMUCOSAL
  Filled 2021-10-25 (×26): qty 15

## 2021-10-25 MED ORDER — FAMOTIDINE 20 MG PO TABS: 40.0000 mg | ORAL_TABLET | Freq: Once | ORAL | Status: DC | PRN

## 2021-10-25 MED ORDER — MAGIC MOUTHWASH W/LIDOCAINE
10.0000 mL | Freq: Four times a day (QID) | ORAL | Status: DC
  Administered 2021-10-25 – 2021-11-03 (×27): 10 mL via ORAL
  Filled 2021-10-25 (×43): qty 10

## 2021-10-25 MED ORDER — ONDANSETRON HCL 4 MG/2ML IJ SOLN: 4.0000 mg | Freq: Four times a day (QID) | INTRAMUSCULAR | Status: DC | PRN

## 2021-10-25 MED ORDER — CLINDAMYCIN PHOSPHATE 600 MG/50ML IV SOLN
600.0000 mg | Freq: Three times a day (TID) | INTRAVENOUS | Status: DC
  Administered 2021-10-25 – 2021-10-30 (×17): 600 mg via INTRAVENOUS
  Filled 2021-10-25 (×19): qty 50

## 2021-10-25 MED ORDER — CLINDAMYCIN PHOSPHATE 300 MG/50ML IV SOLN
INTRAVENOUS | Status: AC
  Filled 2021-10-25: qty 50

## 2021-10-25 MED ORDER — HYDROMORPHONE HCL 1 MG/ML IJ SOLN: 1.0000 mg | Freq: Once | INTRAMUSCULAR | Status: DC | PRN

## 2021-10-25 MED ORDER — METHYLPREDNISOLONE SODIUM SUCC 125 MG IJ SOLR: 125.0000 mg | Freq: Once | INTRAMUSCULAR | Status: DC | PRN

## 2021-10-25 MED ORDER — DIPHENHYDRAMINE HCL 50 MG/ML IJ SOLN
50.0000 mg | Freq: Once | INTRAMUSCULAR | Status: DC | PRN
Start: 2021-10-25 — End: 2021-10-25

## 2021-10-25 MED ORDER — DEXAMETHASONE SODIUM PHOSPHATE 4 MG/ML IJ SOLN
4.0000 mg | Freq: Two times a day (BID) | INTRAMUSCULAR | Status: DC
  Administered 2021-10-25 (×2): 4 mg via INTRAVENOUS
  Filled 2021-10-25 (×2): qty 1

## 2021-10-25 MED ORDER — MIDAZOLAM HCL 2 MG/ML PO SYRP
8.0000 mg | ORAL_SOLUTION | Freq: Once | ORAL | Status: DC | PRN
Start: 2021-10-25 — End: 2021-10-25
  Filled 2021-10-25: qty 4

## 2021-10-25 SURGICAL SUPPLY — 7 items
CANNULA 5F STIFF (CANNULA) IMPLANT
COVER PROBE U/S 5X48 (MISCELLANEOUS) IMPLANT
DERMABOND ADVANCED (GAUZE/BANDAGES/DRESSINGS)
DERMABOND ADVANCED .7 DNX12 (GAUZE/BANDAGES/DRESSINGS) IMPLANT
PACK ANGIOGRAPHY (CUSTOM PROCEDURE TRAY) IMPLANT
SUT MNCRL AB 4-0 PS2 18 (SUTURE) IMPLANT
SUT SILK 0 FSL (SUTURE) IMPLANT

## 2021-10-25 NOTE — Progress Notes (Signed)
PROGRESS NOTE    Catherine Burke  IOE:[id number] DOB: 1941-01-12 DOA: 10/20/2021 PCP: Idelle Crouch, MD  108A/108A-AA   Assessment & Plan:   Principal Problem:   Angioedema, initial encounter Active Problems:   Hyperlipidemia   Essential hypertension   GERD (gastroesophageal reflux disease)   Chronic kidney disease (CKD), stage IV (severe) (HCC)   ESRD (end stage renal disease) ([place])   Facial cellulitis   Malnutrition of moderate degree   Angioedema   81 year old with medical history of end-stage renal disease on peritoneal dialysis, hypertension, insulin-dependent diabetes mellitus, who presented emergency department for chief concerns of right facial swelling.   Facial swelling thought to be due to angioedema secondary to ACE inhibitor use.  ACE inhibitor's been discontinued and added to allergy list.   On 2/14, Patient had worsening of her facial and tongue swelling.  Pt transferred to [facility] and PCCM consulted on 2/14 for close monitoring.  Pt was transferred back to the floor after tongue swelling improved.  Hospitalization prolonged due to slow progress back to oral intake and now tongue bleeding.   * Angioedema --thought to be due to home telmisartan s/p -Solu-Medrol 125 mg x 1, Benadryl 25 mg x 1, Pepcid 20 mg IV x1, EpiPen x1 then started on IV decadron 6 mg q6h.  After tongue swelling improved, pt was started on steroid taper. Plan: --cont decadron 6 mg BID today --taper to oral decadron 6 mg daily tomorrow --Avoid all ARBs in the future  Tongue bleeding --became more profuse early morning on 2/20.  From sloughing and possible trauma following angioedema. --ENT performed bedside debridement and suctioning  Plan: --continuous ice baths with saline soaked gauze placed on each lateral aspect of her tongue during the day.  --Peridex mouth rinse to help debride this area and keep the area clean. --ok for soft diet --f/u with ENT in a week  Dysphagia --due to  tongue swelling and poor lingual control of boluses and coordination, slowly improving Plan: --cont Dys 1 with nectar thick --cont SLP eval  Essential hypertension Poor control over interval --home ARB telmisartan d/c'ed Plan: --cont clonidine patch 0.2 mg --resume home Imdur --d/c Lopressor 25 mg BID --cont oral hydralazine 25 mg q8h   ESRD on peritoneal dialysis (end stage renal disease) ([place]) --iPD per nephrology --need permCath placement for HD since pt is going to SNF rehab   Facial cellulitis, ruled out Sepsis, ruled out Facial cellulitis felt unlikely - DC'ed antibiotics  Anemia of CKD --Mircera as outpatient  Afib, isolated episode --noted early morning 2/15, new dx, likely related to acute illness --started on heparin gtt, since d/c'ed --currently rate controlled and back to NSR  Hypokalemia --monitor and replete PRN  DM2, well controlled Hyperglycemia exacerbated by steroid use --A1c 5.7 --cont glargine 12u daily --SSI resistant scale  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  --nonemergent contrast enhanced brain MRI for further evaluation as outpatient   DVT prophylaxis: Lovenox SQ Code Status: Limited code  Family Communication:   Level of care: Telemetry Medical Dispo:   The patient is from: home Anticipated d/c is to: SNF Anticipated d/c date is: undetermined Patient currently is not medically ready to d/c due to: need control of tongue bleeding   Subjective and Interval History:  Overnight, pt started having bleeding from her tongue.  PermCath placement postponed.  ENT consulted who ordered conservative management.  Pt denied increased tongue swelling, denied difficulty swallowing or  managing her own secretion,  denied pain in her tongue.     Objective: Vitals:   10/08/2021 1248 10/21/2021 1533 10/24/2021 1539 10/27/2021 2012  BP: (!) 185/80 93/60 (!) 136/52 (!) 157/62  Pulse: 71 79 70 67   Resp: 18 17 18 18   Temp: 97.7 F (36.5 C) 98.2 F (36.8 C) 97.6 F (36.4 C) 98.6 F (37 C)  TempSrc: Oral Oral Oral   SpO2: 100% 98% 100% 100%  Weight:      Height:        Intake/Output Summary (Last 24 hours) at 10/26/2021 0007 Last data filed at 10/08/2021 1300 Gross per 24 hour  Intake 10060 ml  Output 9638 ml  Net 422 ml   Filed Weights   10/23/21 0553 10/24/21 0543 10/14/2021 0858  Weight: 59.3 kg 60.6 kg 61.5 kg    Examination:   Constitutional: NAD, AAOx3 HEENT: conjunctivae and lids normal, EOMI, oral packing present CV: No cyanosis.   RESP: normal respiratory effort, on RA Extremities: No effusions, edema in BLE SKIN: warm, dry Neuro: II - XII grossly intact.   Psych: subdued mood and affect.  Appropriate judgement and reason   Data Reviewed: I have personally reviewed following labs and imaging studies  CBC: Recent Labs  Lab 10/21/21 0424 10/22/21 0503 10/23/21 0445 10/24/21 0554 10/23/2021 0449  WBC 13.6* 13.2* 14.9* 24.7* 22.7*  HGB 8.0* 7.8* 7.4* 7.9* 8.3*  HCT 25.2* 24.9* 23.9* 25.9* 26.0*  MCV 85.4 87.1 86.9 87.2 86.7  PLT 241 231 205 231 709   Basic Metabolic Panel: Recent Labs  Lab 10/21/21 0424 10/22/21 0503 10/23/21 0445 10/24/21 0554 10/24/21 1040 10/30/2021 0449  NA 133* 134* 132* 133*  --  132*  K 3.5 3.4* 3.1* 3.7  --  3.8  CL 98 97* 97* 100  --  98  CO2 26 25 25 25   --  23  GLUCOSE 304* 351* 364* 138*  --  341*  BUN 61* 64* 69* 78*  --  76*  CREATININE 5.13* 4.91* 4.93* 5.55*  --  5.30*  CALCIUM 7.7* 7.7* 7.6* 7.7*  --  7.8*  MG 2.1 2.0 1.9 2.0 1.9 1.8  PHOS  --   --   --   --  5.8* 5.2*   GFR: Estimated Creatinine Clearance: 7.1 mL/min (A) (by C-G formula based on SCr of 5.3 mg/dL (H)). Liver Function Tests: No results for input(s): AST, ALT, ALKPHOS, BILITOT, PROT, ALBUMIN in the last 168 hours.  No results for input(s): LIPASE, AMYLASE in the last 168 hours. No results for input(s): AMMONIA in the last 168  hours. Coagulation Profile: No results for input(s): INR, PROTIME in the last 168 hours.  Cardiac Enzymes: No results for input(s): CKTOTAL, CKMB, CKMBINDEX, TROPONINI in the last 168 hours. BNP (last 3 results) No results for input(s): PROBNP in the last 8760 hours. HbA1C: No results for input(s): HGBA1C in the last 72 hours.  CBG: Recent Labs  Lab 10/31/2021 0814 10/11/2021 1030 11/01/2021 1249 10/24/2021 1743 10/09/2021 2016  GLUCAP 291* 255* 297* 177* 185*   Lipid Profile: No results for input(s): CHOL, HDL, LDLCALC, TRIG, CHOLHDL, LDLDIRECT in the last 72 hours. Thyroid Function Tests: No results for input(s): TSH, T4TOTAL, FREET4, T3FREE, THYROIDAB in the last 72 hours.  Anemia Panel: No results for input(s): VITAMINB12, FOLATE, FERRITIN, TIBC, IRON, RETICCTPCT in the last 72 hours. Sepsis Labs: No results for input(s): PROCALCITON, LATICACIDVEN in the last 168 hours.   Recent Results (from the past 240  hour(s))  Resp Panel by RT-PCR (Flu A&B, Covid) Nasopharyngeal Swab     Status: None   Collection Time: 10/17/2021  5:18 PM   Specimen: Nasopharyngeal Swab; Nasopharyngeal(NP) swabs in vial transport medium  Result Value Ref Range Status   SARS Coronavirus 2 by RT PCR NEGATIVE NEGATIVE Final    Comment: (NOTE) SARS-CoV-2 target nucleic acids are NOT DETECTED.  The SARS-CoV-2 RNA is generally detectable in upper respiratory specimens during the acute phase of infection. The lowest concentration of SARS-CoV-2 viral copies this assay can detect is 138 copies/mL. A negative result does not preclude SARS-Cov-2 infection and should not be used as the sole basis for treatment or other patient management decisions. A negative result may occur with  improper specimen collection/handling, submission of specimen other than nasopharyngeal swab, presence of viral mutation(s) within the areas targeted by this assay, and inadequate number of viral copies(<138 copies/mL). A negative result  must be combined with clinical observations, patient history, and epidemiological information. The expected result is Negative.  Fact Sheet for Patients:  EntrepreneurPulse.com.au  Fact Sheet for Healthcare Providers:  IncredibleEmployment.be  This test is no t yet approved or cleared by the [place] FDA and  has been authorized for detection and/or diagnosis of SARS-CoV-2 by FDA under an Emergency Use Authorization (EUA). This EUA will remain  in effect (meaning this test can be used) for the duration of the COVID-19 declaration under Section 564(b)(1) of the Act, 21 U.S.C.section 360bbb-3(b)(1), unless the authorization is terminated  or revoked sooner.       Influenza A by PCR NEGATIVE NEGATIVE Final   Influenza B by PCR NEGATIVE NEGATIVE Final    Comment: (NOTE) The Xpert Xpress SARS-CoV-2/FLU/RSV plus assay is intended as an aid in the diagnosis of influenza from Nasopharyngeal swab specimens and should not be used as a sole basis for treatment. Nasal washings and aspirates are unacceptable for Xpert Xpress SARS-CoV-2/FLU/RSV testing.  Fact Sheet for Patients: EntrepreneurPulse.com.au  Fact Sheet for Healthcare Providers: IncredibleEmployment.be  This test is not yet approved or cleared by the [place] FDA and has been authorized for detection and/or diagnosis of SARS-CoV-2 by FDA under an Emergency Use Authorization (EUA). This EUA will remain in effect (meaning this test can be used) for the duration of the COVID-19 declaration under Section 564(b)(1) of the Act, 21 U.S.C. section 360bbb-3(b)(1), unless the authorization is terminated or revoked.  Performed at [facility], [address] [place], [place] [address]   Body fluid culture w Gram Stain     Status: None   Collection Time: 10/29/2021 11:40 PM   Specimen: Peritoneal Washings; Body Fluid  Result Value Ref Range  Status   Specimen Description   Final    PERITONEAL DIALYSIS Performed at [facility], [address], [place], [place] [address]    Special Requests   Final    NONE Performed at [facility], [address] [place], [place] [address]    Gram Stain   Final    NO SQUAMOUS EPITHELIAL CELLS SEEN NO WBC SEEN NO ORGANISMS SEEN    Culture   Final    NO GROWTH 3 DAYS Performed at [facility], [place] [address] [place], [place] [address]    Report Status 10/20/2021 FINAL  Final  MRSA Next Gen by PCR, Nasal     Status: None   Collection Time: 10/18/21  1:40 PM   Specimen: Nasal Mucosa; Nasal Swab  Result Value Ref Range Status  MRSA by PCR Next Gen NOT DETECTED NOT DETECTED Final    Comment: (NOTE) The GeneXpert MRSA Assay (FDA approved for NASAL specimens only), is one component of a comprehensive MRSA colonization surveillance program. It is not intended to diagnose MRSA infection nor to guide or monitor treatment for MRSA infections. Test performance is not FDA approved in patients less than 64 years old. Performed at [facility], [address] [place], [place] [address]       Radiology Studies: ABORTED INVASIVE LAB PROCEDURE  Result Date: 10/05/2021 See surgical note for result.    Scheduled Meds:  chlorhexidine  15 mL Mouth/Throat Daily   chlorhexidine  15 mL Mouth/Throat QID   Chlorhexidine Gluconate Cloth  6 each Topical Daily   cloNIDine  0.2 mg Transdermal Weekly   darbepoetin (ARANESP) injection - NON-DIALYSIS  100 mcg Subcutaneous Q Thu-1800   dexamethasone (DECADRON) injection  4 mg Intravenous Q12H   feeding supplement (NEPRO CARB STEADY)  237 mL Oral TID   gentamicin cream  1 application Topical Daily   hydrALAZINE  25 mg Oral Q8H   insulin aspart  0-20 Units Subcutaneous TID WC   insulin aspart  0-5 Units Subcutaneous QHS   insulin glargine-yfgn  12 Units Subcutaneous Daily   magic mouthwash w/lidocaine  10 mL Oral  QID   metoprolol tartrate  25 mg Oral BID   multivitamin  1 tablet Oral QHS   pantoprazole (PROTONIX) IV  40 mg Intravenous Q24H   Continuous Infusions:  clindamycin (CLEOCIN) IV 600 mg (10/11/2021 2121)   dialysis solution 1.5% low-MG/low-CA       LOS: 8 days     Enzo Bi, MD [facility] If 7PM-7AM, please contact night-coverage 10/26/2021, 12:07 AM

## 2021-10-25 NOTE — TOC Progression Note (Signed)
Transition of Care [facility]) - Progression Note    Patient Details  Name: Catherine Burke MRN: [id number] Date of Birth: 11-Mar-1941  [facility] CM/SW [place], RN Phone Number: 10/19/2021, 1:45 PM  Clinical Narrative:   Patient had difficulty with a procedure today.  Resting in room.  [place] looking at [employer] request    Expected Discharge Plan: [place] Barriers to Discharge: Continued Medical Work up  Expected Discharge Plan and Services Expected Discharge Plan: [place]   Discharge Planning Services: CM Consult   Living arrangements for the past 2 months: Single Family Home                 DME Arranged: N/A DME Agency: NA                   Social Determinants of Health (SDOH) Interventions    Readmission Risk Interventions Readmission Risk Prevention Plan 10/19/2021  Transportation Screening Complete  Medication Review [employer]) Complete  PCP or Specialist appointment within 3-5 days of discharge Complete  HRI or Home Care Consult Complete  SW Recovery Care/Counseling Consult Complete  Palliative Care Screening Not [place] Complete  Some recent data might be hidden

## 2021-10-25 NOTE — Progress Notes (Signed)
Medications administered meds. Iced saline gauze in mouth due to be changed. Patient requesting to have gauze removed for awhile to attempt to eat/drink and visit with her family. Patient asked to call RN when she was ready to have gauze replaced.

## 2021-10-25 NOTE — Progress Notes (Signed)
Patient PIV occluded. Patient difficult stick. IV team consult put in. Patient to receive IV meds when access is established.

## 2021-10-25 NOTE — Progress Notes (Signed)
Starting referral to transfer clinics and modalities. Referral sent to [facility].

## 2021-10-25 NOTE — Progress Notes (Signed)
PT Cancellation Note  Patient Details Name: Catherine Burke MRN: [id number] DOB: 13-May-1941   Cancelled Treatment:     Pt currently off the unit for permcath placement. Per rounds, pt is also having active bleeding of tongue requiring continuous ice baths and saline gauze to be placed. PT to hold and re-attempt at next available time.    Josie Dixon 10/11/2021, 2:26 PM

## 2021-10-25 NOTE — Progress Notes (Signed)
Gauze in mouth already completely dried. Gauze changed out with iced saline gauze.

## 2021-10-25 NOTE — Progress Notes (Signed)
OT Cancellation Note  Patient Details Name: Catherine Burke MRN: [id number] DOB: 10/05/1940   Cancelled Treatment:    Reason Eval/Treat Not Completed: Patient at procedure or test/ unavailable. Pt currently off the unit for permcath placement. Per rounds, pt is also having active bleeding of tongue requiring continuous ice baths and saline gauze to be placed. OT to hold and re-attempt at next available time.   Darleen Crocker, [place], OTR/L , CBIS ascom [phone]  10/24/2021, 12:27 PM

## 2021-10-25 NOTE — Progress Notes (Signed)
Iced saline gauze removed for change out. Patient requesting it be left out for awhile.

## 2021-10-25 NOTE — Interval H&P Note (Signed)
History and Physical Interval Note:  10/15/2021 9:11 AM  Catherine Burke  has presented today for surgery, with the diagnosis of End stage renal disease.  The various methods of treatment have been discussed with the patient and family. After consideration of risks, benefits and other options for treatment, the patient has consented to  Procedure(s): DIALYSIS/PERMA CATHETER INSERTION (N/A) as a surgical intervention.  The patient's history has been reviewed, patient examined, no change in status, stable for surgery.  I have reviewed the patient's chart and labs.  Questions were answered to the patient's satisfaction.     Hortencia Pilar

## 2021-10-25 NOTE — Progress Notes (Signed)
Pt with bloody in mouth. Bleeding from around tongue. Denies pain. NP paged and came to assess at bedside. Oral care done and bleeding subsided. Will continue to monitor.

## 2021-10-25 NOTE — Op Note (Signed)
The patient was brought down with the plan for placing a tunneled catheter so that she could be discharged to a skilled nursing unit.  Upon transferring the patient from her bed to the fluoroscopy table she began bleeding profusely from what appears to be her tongue.  Apparently this occurred last night as well.  Examination demonstrates what appears to be necrotic material along the left border of the tongue as well as underneath the tongue and then blood just welling up quite steadily.  I have packed the patient's mouth with iced saline moistened gauze and will ask ENT to evaluate.  In the meantime we will hold her in the specials preop area until she can be seen.

## 2021-10-25 NOTE — Progress Notes (Signed)
[facility]  ROUNDING NOTE   Subjective:   Catherine Burke was admitted to [facility] on 10/22/2021 for Facial swelling [R22.0] Angioedema, initial encounter [T78.3XXA] Fever, unspecified fever cause [R50.9] Angioedema [T78.3XXA]  Patient is known to our clinic and receives PD management at [facility], supervised by Dr Juleen China.   Patient seen in special procedures Currently n.p.o. awaiting PermCath placement Received PD treatment last night, tolerated well No complaints at this time  Objective:  Vital signs in last 24 hours:  Temp:  [97.5 F (36.4 C)-99.2 F (37.3 C)] 97.9 F (36.6 C) (02/21 1011) Pulse Rate:  [67-77] 72 (02/21 1011) Resp:  [16-18] 18 (02/21 1011) BP: (142-175)/(62-78) 173/69 (02/21 1011) SpO2:  [97 %-100 %] 97 % (02/21 1011) Weight:  [61.5 kg] 61.5 kg (02/21 0858)  Weight change:  Filed Weights   10/23/21 0553 10/24/21 0543 10/15/2021 0858  Weight: 59.3 kg 60.6 kg 61.5 kg    Intake/Output: No intake/output data recorded.   Intake/Output this shift:  Total I/O In: 10000 [Other:10000] Out: 9638 [Other:9638]  Physical Exam: General: NAD  Head: Facial/ tongue swelling improved   Eyes: Anicteric  Lungs:  Clear to auscultation, normal effort  Heart: Regular rate and rhythm  Abdomen:  Soft, nontender  Extremities:  trace Dependent peripheral edema.  Neurologic: Alert and oriented  Skin: No lesions, generalized ecchymosis   Access: PD catheter    Basic Metabolic Panel: Recent Labs  Lab 10/21/21 0424 10/22/21 0503 10/23/21 0445 10/24/21 0554 10/24/21 1040 10/19/2021 0449  NA 133* 134* 132* 133*  --  132*  K 3.5 3.4* 3.1* 3.7  --  3.8  CL 98 97* 97* 100  --  98  CO2 26 25 25 25   --  23  GLUCOSE 304* 351* 364* 138*  --  341*  BUN 61* 64* 69* 78*  --  76*  CREATININE 5.13* 4.91* 4.93* 5.55*  --  5.30*  CALCIUM 7.7* 7.7* 7.6* 7.7*  --  7.8*  MG 2.1 2.0 1.9 2.0 1.9 1.8  PHOS  --   --   --   --  5.8* 5.2*     Liver Function  Tests: No results for input(s): AST, ALT, ALKPHOS, BILITOT, PROT, ALBUMIN in the last 168 hours.  No results for input(s): LIPASE, AMYLASE in the last 168 hours. No results for input(s): AMMONIA in the last 168 hours.  CBC: Recent Labs  Lab 10/21/21 0424 10/22/21 0503 10/23/21 0445 10/24/21 0554 10/12/2021 0449  WBC 13.6* 13.2* 14.9* 24.7* 22.7*  HGB 8.0* 7.8* 7.4* 7.9* 8.3*  HCT 25.2* 24.9* 23.9* 25.9* 26.0*  MCV 85.4 87.1 86.9 87.2 86.7  PLT 241 231 205 231 259     Cardiac Enzymes: No results for input(s): CKTOTAL, CKMB, CKMBINDEX, TROPONINI in the last 168 hours.  BNP: Invalid input(s): POCBNP  CBG: Recent Labs  Lab 10/24/21 1214 10/24/21 1600 10/24/21 2030 10/11/2021 0814 10/20/2021 1030  GLUCAP 241* 238* 290* 291* 255*     Microbiology: Results for orders placed or performed during the hospital encounter of 10/05/2021  Resp Panel by RT-PCR (Flu A&B, Covid) Nasopharyngeal Swab     Status: None   Collection Time: 10/10/2021  5:18 PM   Specimen: Nasopharyngeal Swab; Nasopharyngeal(NP) swabs in vial transport medium  Result Value Ref Range Status   SARS Coronavirus 2 by RT PCR NEGATIVE NEGATIVE Final    Comment: (NOTE) SARS-CoV-2 target nucleic acids are NOT DETECTED.  The SARS-CoV-2 RNA is generally detectable in upper respiratory specimens  during the acute phase of infection. The lowest concentration of SARS-CoV-2 viral copies this assay can detect is 138 copies/mL. A negative result does not preclude SARS-Cov-2 infection and should not be used as the sole basis for treatment or other patient management decisions. A negative result may occur with  improper specimen collection/handling, submission of specimen other than nasopharyngeal swab, presence of viral mutation(s) within the areas targeted by this assay, and inadequate number of viral copies(<138 copies/mL). A negative result must be combined with clinical observations, patient history, and  epidemiological information. The expected result is Negative.  Fact Sheet for Patients:  EntrepreneurPulse.com.au  Fact Sheet for Healthcare Providers:  IncredibleEmployment.be  This test is no t yet approved or cleared by the [place] FDA and  has been authorized for detection and/or diagnosis of SARS-CoV-2 by FDA under an Emergency Use Authorization (EUA). This EUA will remain  in effect (meaning this test can be used) for the duration of the COVID-19 declaration under Section 564(b)(1) of the Act, 21 U.S.C.section 360bbb-3(b)(1), unless the authorization is terminated  or revoked sooner.       Influenza A by PCR NEGATIVE NEGATIVE Final   Influenza B by PCR NEGATIVE NEGATIVE Final    Comment: (NOTE) The Xpert Xpress SARS-CoV-2/FLU/RSV plus assay is intended as an aid in the diagnosis of influenza from Nasopharyngeal swab specimens and should not be used as a sole basis for treatment. Nasal washings and aspirates are unacceptable for Xpert Xpress SARS-CoV-2/FLU/RSV testing.  Fact Sheet for Patients: EntrepreneurPulse.com.au  Fact Sheet for Healthcare Providers: IncredibleEmployment.be  This test is not yet approved or cleared by the [place] FDA and has been authorized for detection and/or diagnosis of SARS-CoV-2 by FDA under an Emergency Use Authorization (EUA). This EUA will remain in effect (meaning this test can be used) for the duration of the COVID-19 declaration under Section 564(b)(1) of the Act, 21 U.S.C. section 360bbb-3(b)(1), unless the authorization is terminated or revoked.  Performed at [facility], [address] [place], [place] [address]   Body fluid culture w Gram Stain     Status: None   Collection Time: 10/08/2021 11:40 PM   Specimen: Peritoneal Washings; Body Fluid  Result Value Ref Range Status   Specimen Description   Final    PERITONEAL  DIALYSIS Performed at [facility], [address] [place], [place] [address]    Special Requests   Final    NONE Performed at [facility], [address] [place], [place] [address]    Gram Stain   Final    NO SQUAMOUS EPITHELIAL CELLS SEEN NO WBC SEEN NO ORGANISMS SEEN    Culture   Final    NO GROWTH 3 DAYS Performed at [facility], [place] [address], [place], [place] [address]    Report Status 10/20/2021 FINAL  Final  MRSA Next Gen by PCR, Nasal     Status: None   Collection Time: 10/18/21  1:40 PM   Specimen: Nasal Mucosa; Nasal Swab  Result Value Ref Range Status   MRSA by PCR Next Gen NOT DETECTED NOT DETECTED Final    Comment: (NOTE) The GeneXpert MRSA Assay (FDA approved for NASAL specimens only), is one component of a comprehensive MRSA colonization surveillance program. It is not intended to diagnose MRSA infection nor to guide or monitor treatment for MRSA infections. Test performance is not FDA approved in patients less than 59 years old. Performed at [facility], [place],  [place], [place] [address]     Coagulation Studies: No results for input(s): LABPROT, INR in the last 72 hours.   Urinalysis: No results for input(s): COLORURINE, LABSPEC, PHURINE, GLUCOSEU, HGBUR, BILIRUBINUR, KETONESUR, PROTEINUR, UROBILINOGEN, NITRITE, LEUKOCYTESUR in the last 72 hours.  Invalid input(s): APPERANCEUR    Imaging: ABORTED INVASIVE LAB PROCEDURE  Result Date: 11/01/2021 See surgical note for result.    Medications:    sodium chloride 10 mL/hr at 10/18/2021 1033   clindamycin     clindamycin (CLEOCIN) IV     [MAR Hold] clindamycin (CLEOCIN) IV 600 mg (10/21/2021 0747)   [MAR Hold] dialysis solution 1.5% low-MG/low-CA      [MAR Hold] chlorhexidine  15 mL Mouth/Throat Daily   chlorhexidine  15 mL Mouth/Throat QID   [MAR Hold] Chlorhexidine Gluconate Cloth  6 each Topical Daily   [MAR Hold] cloNIDine  0.2 mg Transdermal  Weekly   [MAR Hold] darbepoetin (ARANESP) injection - NON-DIALYSIS  100 mcg Subcutaneous Q Thu-1800   [MAR Hold] dexamethasone (DECADRON) injection  4 mg Intravenous Q12H   [MAR Hold] feeding supplement (NEPRO CARB STEADY)  237 mL Oral TID   [MAR Hold] gentamicin cream  1 application Topical Daily   [MAR Hold] hydrALAZINE  25 mg Oral Q8H   [MAR Hold] insulin aspart  0-20 Units Subcutaneous TID WC   [MAR Hold] insulin aspart  0-5 Units Subcutaneous QHS   [MAR Hold] insulin glargine-yfgn  12 Units Subcutaneous Daily   [MAR Hold] magic mouthwash w/lidocaine  10 mL Oral QID   [MAR Hold] metoprolol tartrate  25 mg Oral BID   [MAR Hold] multivitamin  1 tablet Oral QHS   [MAR Hold] pantoprazole (PROTONIX) IV  40 mg Intravenous Q24H   [MAR Hold] acetaminophen **OR** [MAR Hold] acetaminophen, [MAR Hold] benzocaine, diphenhydrAMINE, famotidine, [MAR Hold] dianeal solution for CAPD/CCPD with heparin, [MAR Hold] hydrALAZINE, [MAR Hold]  HYDROmorphone (DILAUDID) injection, methylPREDNISolone (SOLU-MEDROL) injection, midazolam, [MAR Hold] ondansetron **OR** [MAR Hold] ondansetron (ZOFRAN) IV  Assessment/ Plan:  Catherine Burke is a 81 y.o. white female with end stage renal disease on peritoneal dialysis, diabetes mellitus type II insulin dependent, hypertension, and hyperlipidemia who is admitted to [facility] on 10/12/2021 for Facial swelling [R22.0] Angioedema, initial encounter [T78.3XXA] Fever, unspecified fever cause [R50.9] Angioedema [T78.3XXA]  [facility] 59kg CCPD 5 exchanges 2 liter fills 9 hours  End Stage Renal Disease:  Patient received peritoneal dialysis overnight, tolerated well.  Patient currently n.p.o. for PermCath placement to temporarily transition to hemodialysis due to need for rehab at discharge.  Appreciate vascular placing PermCath today.  We will schedule first hemodialysis treatment for tomorrow, inpatient. Dialysis coordinator aware of patient and  outpatient needs and outpatient clinic search currently in progress  No intake/output data recorded.   Hypertension:   ACE inhibitor and ARB initially held due to angioedema. Patient currently receiving clonidine, hydroxylate, and metoprolol.  Blood pressure currently elevated at 173/69.   Anemia of chronic kidney disease:  Mircera as outpatient given 09/27/2021.    Lab Results  Component Value Date   HGB 8.3 (L) 10/08/2021  Aranesp ordered weekly (Thursday)  Hemoglobin slightly improved today.  Chart review reveals oral bleeding reported overnight and while awaiting procedure in vascular lab.  4.  Angioedema, believed due to prescribed ACE-I.   Transferred ourt of ICU on 10/20/21.  Continues receiving dexamethasone  Resumed to soft diet  5.  Hypokalemia,  Potassium level 3.8 today.    LOS: 7 Ieesha Abbasi 2/21/202311:59 AM

## 2021-10-25 NOTE — Consult Note (Addendum)
Catherine Burke, [place] [id number] 06/12/1941 Catherine Bi, MD  Reason for Consult: Tongue bleeding  HPI: 81 year old female was admitted on the 12th for angioedema with facial and tongue swelling.  She has been followed in the hospital for other medical conditions including end-stage renal disease requiring dialysis.  Dr. Delana Meyer had brought her down to the vascular procedures for access when she was noted to have bleeding from her tongue.  She tells me this has been going off and on now for 3 to 4 days.  She denies any biting of her tongue or chewing on her tongue but tells me that since she had significant angioedema she has had "sloughing of the sides of my tongue".  The bleeding is intermittent.  Allergies:  Allergies  Allergen Reactions   Ace Inhibitors Swelling   Levofloxacin Other (See Comments)    Throat Swelling    Penicillins Anaphylaxis   Other    Penicillin G Other (See Comments)   Sulfa Antibiotics     ROS: Review of systems normal other than 12 systems except per HPI.  PMH:  Past Medical History:  Diagnosis Date   Anemia    Arthritis    B12 deficiency 04/04/2021   Diabetes mellitus    GERD (gastroesophageal reflux disease)    HLD (hyperlipidemia)    Hypercholesterolemia    Hypertension    Kidney stones    Osteoporosis    Proteinuria     FH:  Family History  Problem Relation Age of Onset   Other Mother    Heart attack Father    Diabetes Maternal Grandmother    Breast cancer Paternal Aunt     SH:  Social History   Socioeconomic History   Marital status: Widowed    Spouse name: Not on file   Number of children: 3   Years of education: Not on file   Highest education level: Not on file  Occupational History   Occupation: retired  Tobacco Use   Smoking status: Never   Smokeless tobacco: Never  Vaping Use   Vaping Use: Never used  Substance and Sexual Activity   Alcohol use: No    Alcohol/week: 0.0 standard drinks   Drug use: No   Sexual activity: Yes   Other Topics Concern   Not on file  Social History Narrative   Not on file   Social Determinants of Health   Financial Resource Strain: Not on file  Food Insecurity: Not on file  Transportation Needs: Not on file  Physical Activity: Not on file  Stress: Not on file  Social Connections: Not on file  Intimate Partner Violence: Not on file    PSH:  Past Surgical History:  Procedure Laterality Date   ABDOMINAL HYSTERECTOMY     BACK SURGERY     lumbar   BREAST CYST ASPIRATION     unsure of laterality    CAPD INSERTION N/A 02/13/2018   Procedure: LAPAROSCOPIC INSERTION CONTINUOUS AMBULATORY PERITONEAL DIALYSIS  (CAPD) CATHETER;  Surgeon: Algernon Huxley, MD;  Location: [facility] ORS;  Service: Vascular;  Laterality: N/A;   CAPD INSERTION N/A 07/21/2021   Procedure: LAPAROSCOPIC INSERTION CONTINUOUS AMBULATORY PERITONEAL DIALYSIS  (CAPD) CATHETER;  Surgeon: Ronny Bacon, MD;  Location: [facility]S;  Service: General;  Laterality: N/A;   CAPD REMOVAL N/A 07/21/2021   Procedure: REMOVAL CONTINUOUS AMBULATORY PERITONEAL DIALYSIS  (CAPD) CATHETER;  Surgeon: Ronny Bacon, MD;  Location: [facility] ORS;  Service: General;  Laterality: N/A;   CHOLECYSTECTOMY     EYE SURGERY  bilateral cataract    NASAL SEPTUM SURGERY      Physical  Exam: Patient is awake and alert in a bed in special procedures.  External ears appear normal the anterior nose benign oral cavity or pharynx shows large clotting in the floor mouth this was suctioned and the lateral tongue edges debrided using suction and forceps.  There are areas on both lateral aspects of her tongue which appear to be tissue that has sloughed and possibly been chewed upon, this was much improved after debrideent.  There is no airway compromise and once debrided and suction there is minimal bleeding  A/P: Tongue bleeding likely related to sloughing and possible trauma following angioedema.  I do not see any excessive bleeding that requires  intervention at this point however I would recommend continuous ice baths with saline soaked gauze placed on each lateral aspect of her tongue during the day.  Would also recommend using Peridex mouth rinse to help debride this area and keep the area clean.  Would also recommend soft diet to prevent any possible chewing on these tissues.  I would like to see her back as an outpatient in 1 week if symptoms change feel free to call me.    Special procedures time proximately 45 minutes   Roena Malady 10/22/2021 11:49 AM

## 2021-10-25 NOTE — Progress Notes (Signed)
Speech Language Pathology Treatment: Dysphagia  Patient Details Name: Catherine Burke MRN: [id number] DOB: 1941-04-19 Today's Date: 10/11/2021 Time: 5726-2035 SLP Time Calculation (min) (ACUTE ONLY): 45 min  Assessment / Plan / Recommendation Clinical Impression  Pt seen today for ongoing assessment of swallowing and toleration of Nectar liquids diet. Family(son, grandson) were present this session. Pt was resting in bed; positioning support w/ pillows under arms given for more upright sitting and for self-feeding. Speech intelligible this session but impacted by recent discomfort; she awakened at ~4:30am w/ blood in her mouth and bleeding from around the tongue. ENT was consulted this AM: "large clotting in the floor mouth this was suctioned and the lateral tongue edges debrided using suction and forceps.  There are areas on both lateral aspects of her tongue which appear to be tissue that has sloughed and possibly been chewed upon, this was much improved after debrideent.  There is no airway compromise and once debrided and suction there is minimal bleeding". ENT felt this new bleeding was "likely related to sloughing and possible trauma following angioedema". ENT recommended: "continuous ice baths with saline soaked gauze placed on each lateral aspect of her tongue during the day.  2. Recommend using Peridex mouth rinse to help debride this area and keep the area clean.  3. Recommend soft diet to prevent any possible chewing on these tissues.". Pt is currently on a Pureed diet w/ Nectar liquids recommended by ST services.   Session initiated w/ focus on oral care/rinse, and education of such to address oral stimulation for lingual movements and swallowing as well as provide oral hygiene. Pt also consumed trials of Nectar liquids but avoided the Puree foods d/t increased soreness of tongue when attempting to manipulate Puree foods in her mouth. Inconsistent throat clearing noted w/ Nectar liquids; moreso  Nepro. Instructed pt to ALTERNATE Nepro w/ other Nectar liquids or applesauce in order to aid oropharyngeal clearing of any phlegmy residue left behind from the Nepro drink. Also instructed her to use mild throat clearing intermittently and at end of all po's to aid airway protection.   Education and aspiration precautions reviewed/discussed w/ pt and Son, family present. Questions answered. ST services will f/u tomorrow w/ ongoing education and dysphagia tx. Encouraged continuing oral care as instructed by ENT. Pt agreed.     HPI HPI: Pt  is a 81 y.o. 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  According to the patient and family since yesterday, patient was admitted to [facility] hospital 2 days ago for pneumoperitoneum suspected to be related to peritoneal dialysis, discharged home and then return to the emergency department at [facility] yesterday for bilateral shoulder pain, mild abdominal pain which was evaluated with no emergent or is new abnormal findings; no pneumonia.  She was discharged home without any interventions and states the orofacial issues have worsened since yesterday.  Per chart review these issues were not noted or evaluated in the emergency department yesterday.  She did have a low-grade fever yesterday per family members but no fever today.  No known dental issues that they are aware of.  She has not been eating or drinking well because of the pain in her swollen mouth/facial area and difficulty swallowing.  Not trying any medications for symptoms thus far.  History of similar issues in the past.   CT of Neck: Right lower facial soft  tissue swelling, possibly cellulitis. No  drainable fluid collection identified.  2. Moderate hypopharyngeal swelling, new from 98/33/8250 and of  uncertain etiology though possibly infectious or  allergic.   CXR: no active disease.      SLP Plan  Continue with current plan of care      Recommendations for follow up therapy are one component of a multi-disciplinary discharge planning process, led by the attending physician.  Recommendations may be updated based on patient status, additional functional criteria and insurance authorization.    Recommendations  Diet recommendations: Dysphagia 1 (puree);Nectar-thick liquid Liquids provided via: Cup;No straw Medication Administration: Crushed with puree Supervision: Patient able to self feed;Intermittent supervision to cue for compensatory strategies (tray setup) Compensations: Minimize environmental distractions;Slow rate;Small sips/bites;Lingual sweep for clearance of pocketing;Multiple dry swallows after each bite/sip;Follow solids with liquid Postural Changes and/or Swallow Maneuvers: Out of bed for meals;Seated upright 90 degrees;Upright 30-60 min after meal                General recommendations:  (Dietician f/u) Oral Care Recommendations: Oral care BID;Oral care before and after PO;Staff/trained caregiver to provide oral care (q2-4 hours during the day also) Follow Up Recommendations: Skilled nursing-short term rehab (<3 hours/day) Assistance recommended at discharge: Intermittent Supervision/Assistance SLP Visit Diagnosis: Dysphagia, oropharyngeal phase (R13.12) (angioedema) Plan: Continue with current plan of care             Catherine Burke, Catherine Burke, [place]; [place] [phone] (ascom) Catherine Burke  10/10/2021, 5:34 PM

## 2021-10-25 NOTE — Progress Notes (Signed)
Patient to specials for HD cath in stable condition.

## 2021-10-25 NOTE — Progress Notes (Signed)
Patient unable to swallow much of anything, including secretions. She is using yaunker to manage secretions. Overnight RN reported patient's tongue "busted open" overnight and was bleeding. Tongue noted to have necrotic tissue and edema. Patient states tongue is "quite painful." MD notified. Meds to be switched to IV as able.

## 2021-10-25 NOTE — Progress Notes (Addendum)
° °      CROSS COVER NOTE  NAME: Catherine Burke MRN: [id number] DOB : [birth year]   Secure chat from RN reporting patient bleeding from mouth.  On bedside evaluation patient has a clot in the floor of her mouth that is also attached to her tongue. Unclear on exam if there is a tongue laceration as I did not want to dislodge the clot. Concern that patient bit tongue while sleeping. Currently protecting airway with no increased work of breathing, on room air, and stable vital signs. Low threshold to transfer to higher level of care considering history and trajectory of her stay this admission.  Plan - Patient instructed to use yankaur to suction bloody secretions by placing yankaur to tip of lips, not in mouth - Aspiration precautions - Clindamycin to cover potential intraoral bacterial sources for possible tongue laceration - Subcutaneous heparin held - See media tab for pictures - ENT consulted  Josephina Shih, MSN, FNP-BC Nurse Practitioner [place] Pager [phone]

## 2021-10-25 NOTE — Progress Notes (Signed)
Patient being removed from PD by dialysis RN

## 2021-10-26 DIAGNOSIS — T783XXA Angioneurotic edema, initial encounter: Secondary | ICD-10-CM | POA: Diagnosis not present

## 2021-10-26 LAB — BASIC METABOLIC PANEL
Anion gap: 13 (ref 5–15)
BUN: 88 mg/dL — ABNORMAL HIGH (ref 8–23)
CO2: 22 mmol/L (ref 22–32)
Calcium: 7.6 mg/dL — ABNORMAL LOW (ref 8.9–10.3)
Chloride: 99 mmol/L (ref 98–111)
Creatinine, Ser: 5.92 mg/dL — ABNORMAL HIGH (ref 0.44–1.00)
GFR, Estimated: 7 mL/min — ABNORMAL LOW (ref >60)
Glucose, Bld: 198 mg/dL — ABNORMAL HIGH (ref 70–99)
Potassium: 4 mmol/L (ref 3.5–5.1)
Sodium: 134 mmol/L — ABNORMAL LOW (ref 135–145)

## 2021-10-26 LAB — CBC
HCT: 24.2 % — ABNORMAL LOW (ref 36.0–46.0)
Hemoglobin: 7.6 g/dL — ABNORMAL LOW (ref 12.0–15.0)
MCH: 27.5 pg (ref 26.0–34.0)
MCHC: 31.4 g/dL (ref 30.0–36.0)
MCV: 87.7 fL (ref 80.0–100.0)
Platelets: 256 10*3/uL (ref 150–400)
RBC: 2.76 MIL/uL — ABNORMAL LOW (ref 3.87–5.11)
RDW: 17.2 % — ABNORMAL HIGH (ref 11.5–15.5)
WBC: 23.9 10*3/uL — ABNORMAL HIGH (ref 4.0–10.5)
nRBC: 0.3 % — ABNORMAL HIGH (ref 0.0–0.2)

## 2021-10-26 LAB — GLUCOSE, CAPILLARY
Glucose-Capillary: 194 mg/dL — ABNORMAL HIGH (ref 70–99)
Glucose-Capillary: 85 mg/dL (ref 70–99)
Glucose-Capillary: 65 mg/dL — ABNORMAL LOW (ref 70–99)
Glucose-Capillary: 117 mg/dL — ABNORMAL HIGH (ref 70–99)

## 2021-10-26 LAB — HEPATITIS B SURFACE ANTIBODY, QUANTITATIVE: Hep B S AB Quant (Post): 35.9 m[IU]/mL (ref >9.9)

## 2021-10-26 LAB — MAGNESIUM: Magnesium: 1.9 mg/dL (ref 1.7–2.4)

## 2021-10-26 MED ORDER — DEXTROSE 50 % IV SOLN
INTRAVENOUS | Status: AC
  Administered 2021-10-26: 17:00:00 25 mL
  Filled 2021-10-26: qty 50

## 2021-10-26 MED ORDER — DEXAMETHASONE 1 MG/ML PO CONC
6.0000 mg | Freq: Every day | ORAL | Status: DC
  Filled 2021-10-26: qty 6

## 2021-10-26 MED ORDER — DEXTROSE-NACL 5-0.9 % IV SOLN
INTRAVENOUS | Status: DC
Start: 2021-10-26 — End: 2021-10-28

## 2021-10-26 MED ORDER — INSULIN ASPART 100 UNIT/ML IJ SOLN
0.0000 [IU] | Freq: Three times a day (TID) | INTRAMUSCULAR | Status: DC
  Administered 2021-10-27: 2 [IU] via SUBCUTANEOUS
  Administered 2021-10-27: 09:00:00 11 [IU] via SUBCUTANEOUS
  Administered 2021-10-27: 3 [IU] via SUBCUTANEOUS
  Administered 2021-10-28 (×2): 5 [IU] via SUBCUTANEOUS
  Filled 2021-10-26 (×4): qty 1

## 2021-10-26 MED ORDER — HYDRALAZINE HCL 20 MG/ML IJ SOLN
10.0000 mg | Freq: Four times a day (QID) | INTRAMUSCULAR | Status: DC
  Administered 2021-10-26 – 2021-11-01 (×16): 10 mg via INTRAVENOUS
  Filled 2021-10-26 (×17): qty 1

## 2021-10-26 MED ORDER — ISOSORBIDE MONONITRATE ER 30 MG PO TB24
30.0000 mg | ORAL_TABLET | Freq: Every day | ORAL | Status: DC
  Administered 2021-10-30: 11:00:00 30 mg via ORAL
  Filled 2021-10-26 (×3): qty 1

## 2021-10-26 MED ORDER — DEXAMETHASONE SODIUM PHOSPHATE 10 MG/ML IJ SOLN
6.0000 mg | INTRAMUSCULAR | Status: DC
  Administered 2021-10-26 – 2021-11-01 (×7): 6 mg via INTRAVENOUS
  Filled 2021-10-26 (×7): qty 1

## 2021-10-26 NOTE — TOC Progression Note (Signed)
Transition of Care [facility]) - Progression Note    Patient Details  Name: Catherine Burke MRN: [id number] Date of Birth: Mar 04, 1941  Transition of Care [facility]) CM/SW [place], RN Phone Number: 10/26/2021, 9:59 AM  Clinical Narrative:   Family [name], Granddaughter) chose [facility].  Magda Paganini made aware.      Expected Discharge Plan: [place] Barriers to Discharge: Continued Medical Work up  Expected Discharge Plan and Services Expected Discharge Plan: [place]   Discharge Planning Services: CM Consult   Living arrangements for the past 2 months: Single Family Home                 DME Arranged: N/A DME Agency: NA                   Social Determinants of Health (SDOH) Interventions    Readmission Risk Interventions Readmission Risk Prevention Plan 10/19/2021  Transportation Screening Complete  Medication Review Press photographer) Complete  PCP or Specialist appointment within 3-5 days of discharge Complete  HRI or Home Care Consult Complete  SW Recovery Care/Counseling Consult Complete  Palliative Care Screening Not [place] Complete  Some recent data might be hidden

## 2021-10-26 NOTE — Progress Notes (Signed)
Occupational Therapy Treatment Patient Details Name: Catherine Burke MRN: [id number] DOB: [birth year] Today's Date: 10/26/2021   History of present illness Pt is an 81 y/o F admitted 10/27/2021 after presenting to the ED with concerns of R facial swelling. Facial swelling thought to be due to angioedema 2/2 ACE inhibitor use. PMH: ESRD on PD, HTN, IDDM, HLD   OT comments  Upon entering the room, pt seated in recliner chair with RN present in the room. Pt getting saline gauze added to mouth secondary to discomfort. No active bleeding noted at this time. Pt requesting to return to bed. Pt standing for low recliner chair with mod lifting assistance. Pt ambulating with RW 10' and min guard to other side of bed. Min A for sit >supine for B LEs. Pt's R UE appears to be weeping and elevated and placed on towel for pt comfort. All needs within reach and pt closing eyes as therapist exits the room. Pt continues to benefit from OT intervention and continued recommendation for short term rehab at discharge to address functional deficits.    Recommendations for follow up therapy are one component of a multi-disciplinary discharge planning process, led by the attending physician.  Recommendations may be updated based on patient status, additional functional criteria and insurance authorization.    Follow Up Recommendations  Skilled nursing-short term rehab (<3 hours/day)    Assistance Recommended at Discharge Frequent or constant Supervision/Assistance  Patient can return home with the following  A lot of help with walking and/or transfers;A lot of help with bathing/dressing/bathroom;Assistance with cooking/housework;Direct supervision/assist for medications management;Direct supervision/assist for financial management;Assist for transportation;Help with stairs or ramp for entrance   Equipment Recommendations  Other (comment) (defer to next venue of care)       Precautions / Restrictions  Precautions Precautions: Fall Precaution Comments: peritoneal dialysis catheter in abdomen Restrictions Weight Bearing Restrictions: No       Mobility Bed Mobility Overal bed mobility: Needs Assistance Bed Mobility: Sit to Supine       Sit to supine: Min assist   General bed mobility comments: min A for B LEs    Transfers Overall transfer level: Needs assistance Equipment used: Rolling walker (2 wheels) Transfers: Sit to/from Stand, Bed to chair/wheelchair/BSC Sit to Stand: Mod assist Stand pivot transfers: Min assist   Step pivot transfers: Min assist     General transfer comment: mod lifting assistance to stand from lower recliner chair surface     Balance Overall balance assessment: Needs assistance Sitting-balance support: Feet supported Sitting balance-Leahy Scale: Good Sitting balance - Comments: supervision static sitting   Standing balance support: Bilateral upper extremity supported, During functional activity, Reliant on assistive device for balance Standing balance-Leahy Scale: Fair Standing balance comment: min guard                           ADL either performed or assessed with clinical judgement    Extremity/Trunk Assessment Upper Extremity Assessment Upper Extremity Assessment: Generalized weakness   Lower Extremity Assessment Lower Extremity Assessment: Generalized weakness        Vision Patient Visual Report: No change from baseline            Cognition Arousal/Alertness: Awake/alert Behavior During Therapy: WFL for tasks assessed/performed Overall Cognitive Status: Within Functional Limits for tasks assessed  General Comments: Sweet lady                   Pertinent Vitals/ Pain       Pain Assessment Pain Assessment: Faces Faces Pain Scale: Hurts little more Pain Location: mouth Pain Descriptors / Indicators: Discomfort Pain Intervention(s): Limited activity within  patient's tolerance, Monitored during session, Repositioned, Premedicated before session         Frequency  Min 2X/week        Progress Toward Goals  OT Goals(current goals can now be found in the care plan section)  Progress towards OT goals: Progressing toward goals  Acute Rehab OT Goals Patient Stated Goal: to return to bed OT Goal Formulation: With patient Time For Goal Achievement: 11/05/21 Potential to Achieve Goals: Good  Plan Discharge plan remains appropriate;Frequency remains appropriate       AM-PAC OT "6 Clicks" Daily Activity     Outcome Measure   Help from another person eating meals?: None Help from another person taking care of personal grooming?: A Little Help from another person toileting, which includes using toliet, bedpan, or urinal?: A Lot Help from another person bathing (including washing, rinsing, drying)?: A Lot Help from another person to put on and taking off regular upper body clothing?: A Little Help from another person to put on and taking off regular lower body clothing?: A Lot 6 Click Score: 16    End of Session Equipment Utilized During Treatment: Rolling walker (2 wheels)  OT Visit Diagnosis: Unsteadiness on feet (R26.81);Muscle weakness (generalized) (M62.81)   Activity Tolerance Patient tolerated treatment well   Patient Left in bed;with call bell/phone within reach;with bed alarm set   Nurse Communication Mobility status        Time: 2993-7169 OT Time Calculation (min): 14 min  Charges: OT General Charges $OT Visit: 1 Visit OT Treatments $Therapeutic Activity: 8-22 mins  Darleen Crocker, MS, OTR/L , CBIS ascom [phone]  10/26/21, 2:08 PM

## 2021-10-26 NOTE — Progress Notes (Signed)
Physical Therapy Treatment Patient Details Name: TALETHA TWIFORD MRN: [id number] DOB: Jun 27, 1941 Today's Date: 10/26/2021   History of Present Illness Pt is an 81 y/o F admitted 10/15/2021 after presenting to the ED with concerns of R facial swelling. Facial swelling thought to be due to angioedema 2/2 ACE inhibitor use. PMH: ESRD on PD, HTN, IDDM, HLD    PT Comments    Patient received in bed, sleeping. She rouses to my voice. States her mouth hurts, but no longer bleeding. B UEs quite swollen and R UE possibly weeping. Patient required min assist for bed mobility ( raising trunk). She is able to sit edge of bed independently with supervision. She required bed slightly elevated and then min guard to stand. She ambulated 5 feet with RW and min guard. Cues for safety with transfers. Patient declined ambulating further at this time. She will continue to benefit from skilled PT while here to improve strength and independence with mobility.     Recommendations for follow up therapy are one component of a multi-disciplinary discharge planning process, led by the attending physician.  Recommendations may be updated based on patient status, additional functional criteria and insurance authorization.  Follow Up Recommendations  Skilled nursing-short term rehab (<3 hours/day)     Assistance Recommended at Discharge Frequent or constant Supervision/Assistance  Patient can return home with the following A little help with walking and/or transfers;A little help with bathing/dressing/bathroom;Help with stairs or ramp for entrance;Assist for transportation;Assistance with cooking/housework   Equipment Recommendations  Rolling walker (2 wheels);BSC/3in1    Recommendations for Other Services       Precautions / Restrictions Precautions Precautions: Fall Precaution Comments: peritoneal dialysis catheter in abdomen Restrictions Weight Bearing Restrictions: No     Mobility  Bed Mobility Overal bed  mobility: Needs Assistance Bed Mobility: Supine to Sit     Supine to sit: HOB elevated, Min guard     General bed mobility comments: min guard/min A to raise trunk to seated position    Transfers Overall transfer level: Needs assistance Equipment used: Rolling walker (2 wheels) Transfers: Sit to/from Stand Sit to Stand: From elevated surface           General transfer comment: min A from slightly elevated bed    Ambulation/Gait Ambulation/Gait assistance: Min guard Gait Distance (Feet): 5 Feet Assistive device: Rolling walker (2 wheels) Gait Pattern/deviations: Decreased step length - right, Decreased step length - left, Decreased stride length Gait velocity: decreased     General Gait Details: patient is generally steady with mobility, poor endurance and weak.   Stairs             Wheelchair Mobility    Modified Rankin (Stroke Patients Only)       Balance Overall balance assessment: Needs assistance Sitting-balance support: Feet supported Sitting balance-Leahy Scale: Good Sitting balance - Comments: supervision static sitting   Standing balance support: Bilateral upper extremity supported, During functional activity, Reliant on assistive device for balance Standing balance-Leahy Scale: Fair Standing balance comment: min guard                            Cognition Arousal/Alertness: Awake/alert, Lethargic Behavior During Therapy: WFL for tasks assessed/performed Overall Cognitive Status: Within Functional Limits for tasks assessed  Exercises      General Comments        Pertinent Vitals/Pain Pain Assessment Pain Assessment: Faces Faces Pain Scale: Hurts little more Pain Location: mouth Pain Descriptors / Indicators: Discomfort    Home Living                          Prior Function            PT Goals (current goals can now be found in the care plan  section) Acute Rehab PT Goals Patient Stated Goal: patient now wants to go home. Says her son lives with her and he is there all the time. Has ramp to enter PT Goal Formulation: With patient Time For Goal Achievement: 11/05/21 Potential to Achieve Goals: Fair Progress towards PT goals: Progressing toward goals    Frequency    Min 2X/week      PT Plan Discharge plan needs to be updated    Co-evaluation              AM-PAC PT "6 Clicks" Mobility   Outcome Measure  Help needed turning from your back to your side while in a flat bed without using bedrails?: A Little Help needed moving from lying on your back to sitting on the side of a flat bed without using bedrails?: A Little Help needed moving to and from a bed to a chair (including a wheelchair)?: A Little Help needed standing up from a chair using your arms (e.g., wheelchair or bedside chair)?: A Little Help needed to walk in hospital room?: A Little Help needed climbing 3-5 steps with a railing? : A Lot 6 Click Score: 17    End of Session Equipment Utilized During Treatment: Gait belt Activity Tolerance: Patient limited by fatigue Patient left: in chair;with call bell/phone within reach;with chair alarm set Nurse Communication: Mobility status PT Visit Diagnosis: Muscle weakness (generalized) (M62.81);Difficulty in walking, not elsewhere classified (R26.2);Unsteadiness on feet (R26.81)     Time: 1050-1105 PT Time Calculation (min) (ACUTE ONLY): 15 min  Charges:  $Therapeutic Activity: 8-22 mins                     Harpreet Signore, PT, GCS 10/26/21,11:30 AM

## 2021-10-26 NOTE — Care Management Important Message (Signed)
Important Message  Patient Details  Name: Catherine Burke MRN: [id number] Date of Birth: 1941-08-05   Medicare Important Message Given:  Yes     Juliann Pulse A Remiel Corti 10/26/2021, 11:09 AM

## 2021-10-26 NOTE — Progress Notes (Signed)
PROGRESS NOTE    Catherine Burke  EQA:[id number] DOB: 1941-08-05 DOA: 10/27/2021 PCP: Idelle Crouch, MD    Brief Narrative:  81 year old with medical history of end-stage renal disease on peritoneal dialysis, hypertension, insulin-dependent diabetes mellitus, who presented emergency department for chief concerns of right facial swelling.   Facial swelling thought to be due to angioedema secondary to ACE inhibitor use.  ACE inhibitor's been discontinued and added to allergy list.   On 2/14, Patient had worsening of her facial and tongue swelling.  Pt transferred to [facility] and PCCM consulted on 2/14 for close monitoring.  Pt was transferred back to the floor after tongue swelling improved.  Hospitalization prolonged due to slow progress back to oral intake and now tongue bleeding.  Angioedema has improved however tongue swelling was an issue.  Seen in consultation by ENT.  No aggressive management recommended.  Tongue swelling has improved but not yet totally resolved.  Facial swelling again has improved but not yet resolved.  Patient having difficulty with p.o. medications and oral intake.    Assessment & Plan:   Principal Problem:   Angioedema, initial encounter Active Problems:   Hyperlipidemia   Essential hypertension   GERD (gastroesophageal reflux disease)   Chronic kidney disease (CKD), stage IV (severe) (HCC)   ESRD (end stage renal disease) (HCC)   Facial cellulitis   Malnutrition of moderate degree   Angioedema  * Angioedema --thought to be due to home telmisartan s/p -Solu-Medrol 125 mg x 1, Benadryl 25 mg x 1, Pepcid 20 mg IV x1, EpiPen x1 then started on IV decadron 6 mg q6h.  After tongue swelling improved, pt was started on steroid taper. -Difficulty tolerating oral Decadron Plan: Decadron 6 mg IV daily ARB/ACE inhibitors added to allergy list   Tongue bleeding --became more profuse early morning on 2/20.  From sloughing and possible trauma following  angioedema. --ENT performed bedside debridement and suctioning  Plan: Peridex mouth rinses Soft diet as tolerated Appreciate follow-up from ENT Outpatient follow-up 1 week postdischarge   Dysphagia --due to tongue swelling and poor lingual control of boluses and coordination, slowly improving Plan: --cont Dys 1 with nectar thick --cont SLP eval   Essential hypertension Poor control over interval --home ARB telmisartan d/c'ed Plan: --cont clonidine patch 0.2 mg -- Continue home Imdur --d/c Lopressor 25 mg BID Patient may not be able to tolerate oral medications at this time.  Oral hydralazine switch to IV scheduled   ESRD on peritoneal dialysis (end stage renal disease) ([place]) Currently on peritoneal dialysis.  Vascular surgery was attempting PermCath placement.  This plan is currently up in the air as disposition is currently a question.  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  --nonemergent contrast enhanced brain MRI for further evaluation as outpatient   DVT prophylaxis: SQ Lovenox Code Status: Partial Family Communication: None today Disposition Plan: Status is: Inpatient Remains inpatient appropriate because: Tongue bleeding.  Poor p.o. intake and dysphagia secondary to angioedema.  Will need to tolerate p.o. intake prior to disposition.           Level of care:  Telemetry Medical  Consultants:  Nephrology Vascular surgery ENT  Procedures:  None  Antimicrobials: None   Subjective: Seen and examined.  Facial swelling improved from my prior evaluation 1 week ago.  Seen by ENT this morning.  No active bleeding from tongue  Objective: Vitals:   10/26/21 0444 10/26/21 0500 10/26/21 0525 10/26/21 0746  BP: (!) 183/66  (!) 171/71 (!) 176/67  Pulse: 73   70  Resp: 17   15  Temp: 97.6 F (36.4 C)   98 F (36.7 C)  TempSrc:      SpO2: 98%   100%  Weight:  58.7 kg    Height:        Intake/Output Summary (Last 24 hours) at 10/26/2021 1147 Last data filed at 10/26/2021 1300 Gross per 24 hour  Intake 60 ml  Output --  Net 60 ml   Filed Weights   10/24/21 0543 10/26/2021 0858 10/26/21 0500  Weight: 60.6 kg 61.5 kg 58.7 kg    Examination:  General exam: Appears fatigued.  Oral packing in place Respiratory system: Lungs clear.  Normal work of breathing.  Room air Cardiovascular system: S1-S2, RRR, no murmurs, no pedal edema Gastrointestinal system: Soft, NT/ND, normal bowel sounds Central nervous system: Alert and oriented. No focal neurological deficits. Extremities: Symmetric 5 x 5 power. Skin: No rashes, lesions or ulcers Psychiatry: Judgement and insight appear normal. Mood & affect appropriate.     Data Reviewed: I have personally reviewed following labs and imaging studies  CBC: Recent Labs  Lab 10/22/21 0503 10/23/21 0445 10/24/21 0554 10/20/2021 0449 10/26/21 0511  WBC 13.2* 14.9* 24.7* 22.7* 23.9*  HGB 7.8* 7.4* 7.9* 8.3* 7.6*  HCT 24.9* 23.9* 25.9* 26.0* 24.2*  MCV 87.1 86.9 87.2 86.7 87.7  PLT 231 205 231 259 170   Basic Metabolic Panel: Recent Labs  Lab 10/22/21 0503 10/23/21 0445 10/24/21 0554 10/24/21 1040 10/06/2021 0449 10/26/21 0511  NA 134* 132* 133*  --  132* 134*  K 3.4* 3.1* 3.7  --  3.8 4.0  CL 97* 97* 100  --  98 99  CO2 25 25 25   --  23 22  GLUCOSE 351* 364* 138*  --  341* 198*  BUN 64* 69*  78*  --  76* 88*  CREATININE 4.91* 4.93* 5.55*  --  5.30* 5.92*  CALCIUM 7.7* 7.6* 7.7*  --  7.8* 7.6*  MG 2.0 1.9 2.0 1.9 1.8 1.9  PHOS  --   --   --  5.8* 5.2*  --    GFR: Estimated Creatinine Clearance: 6.3 mL/min (A) (by C-G formula based on SCr of 5.92 mg/dL (H)). Liver Function Tests: No results for input(s): AST, ALT, ALKPHOS, BILITOT, PROT, ALBUMIN in the last 168 hours. No results for input(s): LIPASE, AMYLASE in the last 168 hours. No results for input(s): AMMONIA in the last 168 hours. Coagulation Profile: No results for input(s): INR, PROTIME in the last 168 hours. Cardiac Enzymes: No results for input(s): CKTOTAL, CKMB, CKMBINDEX, TROPONINI in the last 168 hours. BNP (last 3 results) No results for input(s): PROBNP in the last 8760 hours. HbA1C: No results for input(s): HGBA1C in the last 72 hours. CBG: Recent Labs  Lab 10/07/2021 1030 10/13/2021 1249 10/30/2021 1743 10/08/2021 2016 10/26/21 0745  GLUCAP 255* 297* 177* 185* 194*  Lipid Profile: No results for input(s): CHOL, HDL, LDLCALC, TRIG, CHOLHDL, LDLDIRECT in the last 72 hours. Thyroid Function Tests: No results for input(s): TSH, T4TOTAL, FREET4, T3FREE, THYROIDAB in the last 72 hours. Anemia Panel: No results for input(s): VITAMINB12, FOLATE, FERRITIN, TIBC, IRON, RETICCTPCT in the last 72 hours. Sepsis Labs: No results for input(s): PROCALCITON, LATICACIDVEN in the last 168 hours.  Recent Results (from the past 240 hour(s))  Resp Panel by RT-PCR (Flu A&B, Covid) Nasopharyngeal Swab     Status: None   Collection Time: 10/22/2021  5:18 PM   Specimen: Nasopharyngeal Swab; Nasopharyngeal(NP) swabs in vial transport medium  Result Value Ref Range Status   SARS Coronavirus 2 by RT PCR NEGATIVE NEGATIVE Final    Comment: (NOTE) SARS-CoV-2 target nucleic acids are NOT DETECTED.  The SARS-CoV-2 RNA is generally detectable in upper respiratory specimens during the acute phase of infection. The  lowest concentration of SARS-CoV-2 viral copies this assay can detect is 138 copies/mL. A negative result does not preclude SARS-Cov-2 infection and should not be used as the sole basis for treatment or other patient management decisions. A negative result may occur with  improper specimen collection/handling, submission of specimen other than nasopharyngeal swab, presence of viral mutation(s) within the areas targeted by this assay, and inadequate number of viral copies(<138 copies/mL). A negative result must be combined with clinical observations, patient history, and epidemiological information. The expected result is Negative.  Fact Sheet for Patients:  EntrepreneurPulse.com.au  Fact Sheet for Healthcare Providers:  IncredibleEmployment.be  This test is no t yet approved or cleared by the [place] FDA and  has been authorized for detection and/or diagnosis of SARS-CoV-2 by FDA under an Emergency Use Authorization (EUA). This EUA will remain  in effect (meaning this test can be used) for the duration of the COVID-19 declaration under Section 564(b)(1) of the Act, 21 U.S.C.section 360bbb-3(b)(1), unless the authorization is terminated  or revoked sooner.       Influenza A by PCR NEGATIVE NEGATIVE Final   Influenza B by PCR NEGATIVE NEGATIVE Final    Comment: (NOTE) The Xpert Xpress SARS-CoV-2/FLU/RSV plus assay is intended as an aid in the diagnosis of influenza from Nasopharyngeal swab specimens and should not be used as a sole basis for treatment. Nasal washings and aspirates are unacceptable for Xpert Xpress SARS-CoV-2/FLU/RSV testing.  Fact Sheet for Patients: EntrepreneurPulse.com.au  Fact Sheet for Healthcare Providers: IncredibleEmployment.be  This test is not yet approved or cleared by the [place] FDA and has been authorized for detection and/or diagnosis of SARS-CoV-2 by FDA under  an Emergency Use Authorization (EUA). This EUA will remain in effect (meaning this test can be used) for the duration of the COVID-19 declaration under Section 564(b)(1) of the Act, 21 U.S.C. section 360bbb-3(b)(1), unless the authorization is terminated or revoked.  Performed at [facility], [address] [place], [place] [address]   Body fluid culture w Gram Stain     Status: None   Collection Time: 10/24/2021 11:40 PM   Specimen: Peritoneal Washings; Body Fluid  Result Value Ref Range Status   Specimen Description   Final    PERITONEAL DIALYSIS Performed at [facility], [address], [place], [place] [address]    Special Requests   Final    NONE Performed at [facility], [place], [place] [address]    Gram Stain   Final    NO SQUAMOUS EPITHELIAL CELLS SEEN NO WBC SEEN NO ORGANISMS  SEEN    Culture   Final    NO GROWTH 3 DAYS Performed at [facility], [place] [address], [place], [place] [address]    Report Status 10/20/2021 FINAL  Final  MRSA Next Gen by PCR, Nasal     Status: None   Collection Time: 10/18/21  1:40 PM   Specimen: Nasal Mucosa; Nasal Swab  Result Value Ref Range Status   MRSA by PCR Next Gen NOT DETECTED NOT DETECTED Final    Comment: (NOTE) The GeneXpert MRSA Assay (FDA approved for NASAL specimens only), is one component of a comprehensive MRSA colonization surveillance program. It is not intended to diagnose MRSA infection nor to guide or monitor treatment for MRSA infections. Test performance is not FDA approved in patients less than 41 years old. Performed at [facility], [address] [place], [place] [address]          Radiology Studies: ABORTED INVASIVE LAB PROCEDURE  Result Date: 10/19/2021 See surgical note for result.       Scheduled Meds:  chlorhexidine  15 mL Mouth/Throat Daily   chlorhexidine  15 mL Mouth/Throat QID   Chlorhexidine Gluconate Cloth  6 each  Topical Daily   cloNIDine  0.2 mg Transdermal Weekly   darbepoetin (ARANESP) injection - NON-DIALYSIS  100 mcg Subcutaneous Q Thu-1800   dexamethasone (DECADRON) injection  6 mg Intravenous Q24H   feeding supplement (NEPRO CARB STEADY)  237 mL Oral TID   gentamicin cream  1 application Topical Daily   hydrALAZINE  10 mg Intravenous Q6H   insulin aspart  0-20 Units Subcutaneous TID WC   insulin aspart  0-5 Units Subcutaneous QHS   insulin glargine-yfgn  12 Units Subcutaneous Daily   isosorbide mononitrate  30 mg Oral Daily   magic mouthwash w/lidocaine  10 mL Oral QID   metoprolol tartrate  25 mg Oral BID   multivitamin  1 tablet Oral QHS   pantoprazole (PROTONIX) IV  40 mg Intravenous Q24H   Continuous Infusions:  clindamycin (CLEOCIN) IV 600 mg (10/26/21 0509)   dialysis solution 1.5% low-MG/low-CA Stopped (10/26/21 0140)     LOS: 8 days      Sidney Ace, MD [facility]   If 7PM-7AM, please contact night-coverage  10/26/2021, 11:47 AM

## 2021-10-26 NOTE — Progress Notes (Signed)
Patient had PD ordered for overnight. AC contacted and give this nurse the Dialysis RN number. This nurse then contacted Lorriane Shire, RN about dialysis. She stated that it was reported to her that the patient was going to get dialysis in AM via PermCath. However, it is not stated in the notes or any orders that show that patient will be going for PermCath placement. Lorriane Shire, RN stated to hold PD overnight and have MD reassess in AM.

## 2021-10-26 NOTE — Progress Notes (Signed)
Nutrition Follow-up  DOCUMENTATION CODES:   Non-severe (moderate) malnutrition in context of chronic illness  INTERVENTION:   -Continue Nepro Shake po TID, each supplement provides 425 kcal and 19 grams protein  -Continue Magic cup TID with meals, each supplement provides 290 kcal and 9 grams of protein  -Continue Renal MVI daily -If NGT is placed, recommend:  Initiate Osmolite 1.2 @ 25 ml/hr and increase by 10 ml every 8 hours to goal rate of 55 ml/hr.    80 ml free water flush every 6 hours   Tube feeding regimen provides 1584 kcal, 73 grams of protein, and 1082 ml of H2O. Total free water: 1402 ml daily  -If feedings are initiated, monitor K, Mg, and Phos daily and replete as needed due to high refeeding risk   NUTRITION DIAGNOSIS:   Moderate Malnutrition related to chronic illness (ESRD on PD) as evidenced by mild fat depletion, mild muscle depletion.  Ongoing  GOAL:   Patient will meet greater than or equal to 90% of their needs  Unmet  MONITOR:   Diet advancement, Labs, Weight trends, Skin, I & O's  REASON FOR ASSESSMENT:   Malnutrition Screening Tool    ASSESSMENT:   81 y/o female with h/o ESRD on PD, DM, HTN and HLD who is admitted with lip swelling and angioedema.  2/18- advanced to nectar thick clear liquid diet with Nepro allowed 2/20- advanced to dysphagia 1 diet with nectar thick liquids  Reviewed I/O's: +422 ml x 24 hours and -6 L since admission   Case discussed with MD, RN, and SLP. Pt continues to have bleeding of her tongue and has burning sensation when consuming foods and liquids. She is taking on teaspoonfulls of meals. Noted meal completions 0%.  Per SLP, pt son reports intake was similar yesterday. She has been transitioned to IV medications. Discussed recommendation for NGT placement under fluoroscopy; MD reports plan to discuss with pt tomorrow.   Medications reviewed and include aranesp and decadron.   Labs reviewed: Na: 134, CBGS:  85-297 (inpatient orders for glycemic control are 0-20 units insulin aspart TID with meals, 0-5 units insulin aspart daily at bedtime, and 12 units insulin glargine-yfgn daily).    Diet Order:   Diet Order             DIET - DYS 1 Room service appropriate? Yes; Fluid consistency: Nectar Thick  Diet effective now                   EDUCATION NEEDS:   Education needs have been addressed  Skin:  Skin Assessment: Skin Integrity Issues: Skin Integrity Issues:: DTI DTI: rt and lt sacrum  Last BM:  10/18/21  Height:   Ht Readings from Last 1 Encounters:  10/18/21 5\' 4"  (1.626 m)    Weight:   Wt Readings from Last 1 Encounters:  10/26/21 58.7 kg    Ideal Body Weight:  54.5 kg  BMI:  Body mass index is 22.21 kg/m.  Estimated Nutritional Needs:   Kcal:  1400-1600kcal/day  Protein:  70-80g/day  Fluid:  UOP +1L    Loistine Chance, RD, LDN, [place] Registered Dietitian II Certified Diabetes Care and Education Specialist Please refer to AMION for RD and/or RD on-call/weekend/after hours pager

## 2021-10-26 NOTE — Progress Notes (Signed)
10/26/2021 8:00 AM  [place], [place] [phone]  Follow-up for patient for tongue bleeding and trauma to tongue.  Patient is awake and alert she says she has had no further bleeding she has noted.  She has been using the saline bath ice bath and Peridex mouth rinse.    Temp:  [97.5 F (36.4 C)-98.6 F (37 C)] 98 F (36.7 C) (02/22 0746) Pulse Rate:  [67-79] 70 (02/22 0746) Resp:  [15-18] 15 (02/22 0746) BP: (93-185)/(52-80) 176/67 (02/22 0746) SpO2:  [97 %-100 %] 100 % (02/22 0746) Weight:  [58.7 kg-61.5 kg] 58.7 kg (02/22 0500),     Intake/Output Summary (Last 24 hours) at 10/26/2021 0800 Last data filed at 10/29/2021 1300 Gross per 24 hour  Intake 10060 ml  Output 9638 ml  Net 422 ml    Results for orders placed or performed during the hospital encounter of 10/26/2021 (from the past 24 hour(s))  Glucose, capillary     Status: Abnormal   Collection Time: 11/01/2021  8:14 AM  Result Value Ref Range   Glucose-Capillary 291 (H) 70 - 99 mg/dL  Glucose, capillary     Status: Abnormal   Collection Time: 10/18/2021 10:30 AM  Result Value Ref Range   Glucose-Capillary 255 (H) 70 - 99 mg/dL  Glucose, capillary     Status: Abnormal   Collection Time: 10/13/2021 12:49 PM  Result Value Ref Range   Glucose-Capillary 297 (H) 70 - 99 mg/dL  Hepatitis B core antibody, total     Status: None   Collection Time: 10/07/2021  2:50 PM  Result Value Ref Range   Hep B Core Total Ab NON REACTIVE NON REACTIVE  .Hepatitis B Surface Antigen     Status: None   Collection Time: 10/09/2021  2:50 PM  Result Value Ref Range   Hepatitis B Surface Ag NON REACTIVE NON REACTIVE  Hepatitis B surface antibody     Status: None   Collection Time: 10/24/2021  2:50 PM  Result Value Ref Range   Hepatitis B-Post 35.9 Immunity>9.9 mIU/mL  Glucose, capillary     Status: Abnormal   Collection Time: 10/08/2021  5:43 PM  Result Value Ref Range   Glucose-Capillary 177 (H) 70 - 99 mg/dL  Glucose, capillary     Status: Abnormal    Collection Time: 10/07/2021  8:16 PM  Result Value Ref Range   Glucose-Capillary 185 (H) 70 - 99 mg/dL  CBC     Status: Abnormal   Collection Time: 10/26/21  5:11 AM  Result Value Ref Range   WBC 23.9 (H) 4.0 - 10.5 K/uL   RBC 2.76 (L) 3.87 - 5.11 MIL/uL   Hemoglobin 7.6 (L) 12.0 - 15.0 g/dL   HCT 24.2 (L) 36.0 - 46.0 %   MCV 87.7 80.0 - 100.0 fL   MCH 27.5 26.0 - 34.0 pg   MCHC 31.4 30.0 - 36.0 g/dL   RDW 17.2 (H) 11.5 - 15.5 %   Platelets 256 150 - 400 K/uL   nRBC 0.3 (H) 0.0 - 0.2 %  Basic metabolic panel     Status: Abnormal   Collection Time: 10/26/21  5:11 AM  Result Value Ref Range   Sodium 134 (L) 135 - 145 mmol/L   Potassium 4.0 3.5 - 5.1 mmol/L   Chloride 99 98 - 111 mmol/L   CO2 22 22 - 32 mmol/L   Glucose, Bld 198 (H) 70 - 99 mg/dL   BUN 88 (H) 8 - 23 mg/dL   Creatinine, Ser 5.92 (H)  0.44 - 1.00 mg/dL   Calcium 7.6 (L) 8.9 - 10.3 mg/dL   GFR, Estimated 7 (L) >60 mL/min   Anion gap 13 5 - 15  Magnesium     Status: None   Collection Time: 10/26/21  5:11 AM  Result Value Ref Range   Magnesium 1.9 1.7 - 2.4 mg/dL  Glucose, capillary     Status: Abnormal   Collection Time: 10/26/21  7:45 AM  Result Value Ref Range   Glucose-Capillary 194 (H) 70 - 99 mg/dL    SUBJECTIVE: She does feel better this morning no active bleeding  OBJECTIVE: Examination of the floor mouth and tongue showed no evidence of active bleeding no significant clotting formed in the floor mouth continues to have some healing tissue on the inferior lateral surfaces of the tongue bilaterally.  IMPRESSION: Tongue bleeding/tongue trauma improved  PLAN: Recommend continue the Peridex mouth rinse at least 4 times a day.  I have encouraged the patient to swish vigorously in the mouth with the Peridex for 60 to 90 seconds before spitting the Peridex out.  This will significantly help cleanse the mouth and debride the lateral aspects of the tongue.  It is unclear when she will be discharged but would  recommend that she be discharged with the Peridex mouth rinse, and also instructions about the saline ice rinse should further bleeding occur.  Would recommend a follow-up as an outpatient with me in 1 week following discharge.  Avoid any ACE inhibitors in the future.  Would advise soft diet to avoid chewing to prevent further trauma to the tongue until the tongue has healed.  Roena Malady 10/26/2021, 8:00 AM

## 2021-10-26 NOTE — Progress Notes (Signed)
SLP Cancellation Note  Patient Details Name: RUSSELL ENGELSTAD MRN: [id number] DOB: May 26, 1941   Cancelled treatment:       Reason Eval/Treat Not Completed: Fatigue/lethargy limiting ability to participate (grandson present; spoke w/ him) Attempted to see pt this pm, however, she was sound asleep. Noted she had worked w/ OT w/in the hour. Grandson present and stated she was taking tsp sips of her drinks; similar to yesterday's intake and presentation. He stated "about the same". Concerned about how much oral intake pt is actually able to achieve orally at this time d/t oral discomfort. NSG reported oral (liquid) meds have been changed to IV d/t discomfort of swallowing liquids meds.  Reviewed ENT's oral rinse regimen w/ NSG; signs posted in room re: oral rinse and aspiration precautions. Will reach out to Dietician as well. ST will f/u w/ pt's swallowing and toleration of oral diet tomorrow.      Orinda Kenner, MS, CCC-SLP Speech Language Pathologist Rehab Services; [place] [phone] (ascom) Thaddus Mcdowell 10/26/2021, 2:55 PM

## 2021-10-26 NOTE — Progress Notes (Signed)
PD initiated. Catheter intact. Site no infection. Cycler went into first fill without issue.

## 2021-10-26 NOTE — Progress Notes (Signed)
[facility]  ROUNDING NOTE   Subjective:   Catherine Burke was admitted to [facility] on 10/13/2021 for Facial swelling [R22.0] Angioedema, initial encounter [T78.3XXA] Fever, unspecified fever cause [R50.9] Angioedema [T78.3XXA]  Patient is known to our clinic and receives PD management at [facility], supervised by Dr Juleen China.   Patient sitting up in chair Breakfast at bedside, she only requesting tea Reports no further bleeding, but oral discomfort Requesting discharge home instead of SNF.   Objective:  Vital signs in last 24 hours:  Temp:  [97.6 F (36.4 C)-98.6 F (37 C)] 98 F (36.7 C) (02/22 0746) Pulse Rate:  [67-79] 70 (02/22 0746) Resp:  [15-18] 15 (02/22 0746) BP: (93-183)/(52-71) 176/67 (02/22 0746) SpO2:  [98 %-100 %] 100 % (02/22 0746) Weight:  [58.7 kg] 58.7 kg (02/22 0500)  Weight change:  Filed Weights   10/24/21 0543 10/08/2021 0858 10/26/21 0500  Weight: 60.6 kg 61.5 kg 58.7 kg    Intake/Output: I/O last 3 completed shifts: In: 10060 [P.O.:60; Other:10000] Out: 9638 [Other:9638]   Intake/Output this shift:  No intake/output data recorded.  Physical Exam: General: NAD  Head: Tongue necrosis, dry oral mucosa   Eyes: Anicteric  Lungs:  Clear to auscultation, normal effort  Heart: Regular rate and rhythm  Abdomen:  Soft, nontender  Extremities:  trace Dependent peripheral edema.  Neurologic: Alert and oriented  Skin: No lesions, generalized ecchymosis   Access: PD catheter    Basic Metabolic Panel: Recent Labs  Lab 10/22/21 0503 10/23/21 0445 10/24/21 0554 10/24/21 1040 10/05/2021 0449 10/26/21 0511  NA 134* 132* 133*  --  132* 134*  K 3.4* 3.1* 3.7  --  3.8 4.0  CL 97* 97* 100  --  98 99  CO2 25 25 25   --  23 22  GLUCOSE 351* 364* 138*  --  341* 198*  BUN 64* 69* 78*  --  76* 88*  CREATININE 4.91* 4.93* 5.55*  --  5.30* 5.92*  CALCIUM 7.7* 7.6* 7.7*  --  7.8* 7.6*  MG 2.0 1.9 2.0 1.9 1.8 1.9  PHOS  --   --   --   5.8* 5.2*  --      Liver Function Tests: No results for input(s): AST, ALT, ALKPHOS, BILITOT, PROT, ALBUMIN in the last 168 hours.  No results for input(s): LIPASE, AMYLASE in the last 168 hours. No results for input(s): AMMONIA in the last 168 hours.  CBC: Recent Labs  Lab 10/22/21 0503 10/23/21 0445 10/24/21 0554 10/31/2021 0449 10/26/21 0511  WBC 13.2* 14.9* 24.7* 22.7* 23.9*  HGB 7.8* 7.4* 7.9* 8.3* 7.6*  HCT 24.9* 23.9* 25.9* 26.0* 24.2*  MCV 87.1 86.9 87.2 86.7 87.7  PLT 231 205 231 259 256     Cardiac Enzymes: No results for input(s): CKTOTAL, CKMB, CKMBINDEX, TROPONINI in the last 168 hours.  BNP: Invalid input(s): POCBNP  CBG: Recent Labs  Lab 10/23/2021 1249 10/29/2021 1743 10/13/2021 2016 10/26/21 0745 10/26/21 1217  GLUCAP 297* 177* 185* 194* 85     Microbiology: Results for orders placed or performed during the hospital encounter of 10/14/2021  Resp Panel by RT-PCR (Flu A&B, Covid) Nasopharyngeal Swab     Status: None   Collection Time: 10/24/2021  5:18 PM   Specimen: Nasopharyngeal Swab; Nasopharyngeal(NP) swabs in vial transport medium  Result Value Ref Range Status   SARS Coronavirus 2 by RT PCR NEGATIVE NEGATIVE Final    Comment: (NOTE) SARS-CoV-2 target nucleic acids are NOT DETECTED.  The  SARS-CoV-2 RNA is generally detectable in upper respiratory specimens during the acute phase of infection. The lowest concentration of SARS-CoV-2 viral copies this assay can detect is 138 copies/mL. A negative result does not preclude SARS-Cov-2 infection and should not be used as the sole basis for treatment or other patient management decisions. A negative result may occur with  improper specimen collection/handling, submission of specimen other than nasopharyngeal swab, presence of viral mutation(s) within the areas targeted by this assay, and inadequate number of viral copies(<138 copies/mL). A negative result must be combined with clinical observations,  patient history, and epidemiological information. The expected result is Negative.  Fact Sheet for Patients:  EntrepreneurPulse.com.au  Fact Sheet for Healthcare Providers:  IncredibleEmployment.be  This test is no t yet approved or cleared by the [place] FDA and  has been authorized for detection and/or diagnosis of SARS-CoV-2 by FDA under an Emergency Use Authorization (EUA). This EUA will remain  in effect (meaning this test can be used) for the duration of the COVID-19 declaration under Section 564(b)(1) of the Act, 21 U.S.C.section 360bbb-3(b)(1), unless the authorization is terminated  or revoked sooner.       Influenza A by PCR NEGATIVE NEGATIVE Final   Influenza B by PCR NEGATIVE NEGATIVE Final    Comment: (NOTE) The Xpert Xpress SARS-CoV-2/FLU/RSV plus assay is intended as an aid in the diagnosis of influenza from Nasopharyngeal swab specimens and should not be used as a sole basis for treatment. Nasal washings and aspirates are unacceptable for Xpert Xpress SARS-CoV-2/FLU/RSV testing.  Fact Sheet for Patients: EntrepreneurPulse.com.au  Fact Sheet for Healthcare Providers: IncredibleEmployment.be  This test is not yet approved or cleared by the [place] FDA and has been authorized for detection and/or diagnosis of SARS-CoV-2 by FDA under an Emergency Use Authorization (EUA). This EUA will remain in effect (meaning this test can be used) for the duration of the COVID-19 declaration under Section 564(b)(1) of the Act, 21 U.S.C. section 360bbb-3(b)(1), unless the authorization is terminated or revoked.  Performed at [facility], [address] [place], [place] [address]   Body fluid culture w Gram Stain     Status: None   Collection Time: 10/12/2021 11:40 PM   Specimen: Peritoneal Washings; Body Fluid  Result Value Ref Range Status   Specimen Description   Final     PERITONEAL DIALYSIS Performed at [facility], [address] [place], [place] [address]    Special Requests   Final    NONE Performed at [facility], [address] [place], [place] [address]    Gram Stain   Final    NO SQUAMOUS EPITHELIAL CELLS SEEN NO WBC SEEN NO ORGANISMS SEEN    Culture   Final    NO GROWTH 3 DAYS Performed at [facility], [place] [address], [place], [place] [address]    Report Status 10/20/2021 FINAL  Final  MRSA Next Gen by PCR, Nasal     Status: None   Collection Time: 10/18/21  1:40 PM   Specimen: Nasal Mucosa; Nasal Swab  Result Value Ref Range Status   MRSA by PCR Next Gen NOT DETECTED NOT DETECTED Final    Comment: (NOTE) The GeneXpert MRSA Assay (FDA approved for NASAL specimens only), is one component of a comprehensive MRSA colonization surveillance program. It is not intended to diagnose MRSA infection nor to guide or monitor treatment for MRSA infections. Test performance is not FDA approved in patients less than 63 years old.  Performed at [facility], [address] [place], [place] [address]     Coagulation Studies: No results for input(s): LABPROT, INR in the last 72 hours.   Urinalysis: No results for input(s): COLORURINE, LABSPEC, PHURINE, GLUCOSEU, HGBUR, BILIRUBINUR, KETONESUR, PROTEINUR, UROBILINOGEN, NITRITE, LEUKOCYTESUR in the last 72 hours.  Invalid input(s): APPERANCEUR    Imaging: ABORTED INVASIVE LAB PROCEDURE  Result Date: 10/15/2021 See surgical note for result.    Medications:    clindamycin (CLEOCIN) IV 600 mg (10/26/21 0509)   dialysis solution 1.5% low-MG/low-CA Stopped (10/26/21 0140)    chlorhexidine  15 mL Mouth/Throat Daily   chlorhexidine  15 mL Mouth/Throat QID   Chlorhexidine Gluconate Cloth  6 each Topical Daily   cloNIDine  0.2 mg Transdermal Weekly   darbepoetin (ARANESP) injection - NON-DIALYSIS  100 mcg Subcutaneous Q Thu-1800   dexamethasone (DECADRON)  injection  6 mg Intravenous Q24H   feeding supplement (NEPRO CARB STEADY)  237 mL Oral TID   gentamicin cream  1 application Topical Daily   hydrALAZINE  10 mg Intravenous Q6H   insulin aspart  0-20 Units Subcutaneous TID WC   insulin aspart  0-5 Units Subcutaneous QHS   insulin glargine-yfgn  12 Units Subcutaneous Daily   isosorbide mononitrate  30 mg Oral Daily   magic mouthwash w/lidocaine  10 mL Oral QID   metoprolol tartrate  25 mg Oral BID   multivitamin  1 tablet Oral QHS   pantoprazole (PROTONIX) IV  40 mg Intravenous Q24H   acetaminophen **OR** acetaminophen, benzocaine, hydrALAZINE, HYDROmorphone (DILAUDID) injection, ondansetron **OR** ondansetron (ZOFRAN) IV  Assessment/ Plan:  Catherine Burke is a 81 y.o. white female with end stage renal disease on peritoneal dialysis, diabetes mellitus type II insulin dependent, hypertension, and hyperlipidemia who is admitted to [facility] on 10/31/2021 for Facial swelling [R22.0] Angioedema, initial encounter [T78.3XXA] Fever, unspecified fever cause [R50.9] Angioedema [T78.3XXA]  [facility] 59kg CCPD 5 exchanges 2 liter fills 9 hours  End Stage Renal Disease:  Plan to place permcath yesterday with vascular surgery, but procedure cancelled due to acute bleeding from tongue and risk for aspiration. Will continue to monitor patient and perform PD tonight. Will monitor discharge planning to determine if Permcath is needed for rehab.   02/21 0701 - 02/22 0700 In: 10060 [P.O.:60] Out: 9638    Hypertension:   ACE inhibitor and ARB initially held due to angioedema. Patient currently receiving clonidine, hydralazine, and metoprolol. Isosorbide prescribed today.   Blood pressure 176/67  Anemia of chronic kidney disease:  Mircera as outpatient given 09/27/2021.    Lab Results  Component Value Date   HGB 7.6 (L) 10/26/2021  Aranesp ordered weekly (Thursday) Hgb remains below target. Will monitor.  4.   Angioedema, believed due to prescribed ACE-I.   Transferred ourt of ICU on 10/20/21.  Continues receiving dexamethasone  Resumed to soft diet. ENT following to for acute blleding incident yesterday.   5.  Hypokalemia,  Potassium level 4.0   LOS: 8 Chasity Outten 2/22/20232:32 PM

## 2021-10-26 NOTE — Progress Notes (Signed)
Patient with CBG 65.  Patient alert and oriented and does not want anything PO r/t tongue pain.  75ml D50 given per hypoglycemia protocol and MD notified.  New orders placed by MD and carried out.  Will continue to monitor.

## 2021-10-27 ENCOUNTER — Inpatient Hospital Stay: Payer: Medicare PPO

## 2021-10-27 ENCOUNTER — Other Ambulatory Visit (INDEPENDENT_AMBULATORY_CARE_PROVIDER_SITE_OTHER): Payer: Self-pay | Admitting: Nurse Practitioner

## 2021-10-27 DIAGNOSIS — T783XXA Angioneurotic edema, initial encounter: Secondary | ICD-10-CM | POA: Diagnosis not present

## 2021-10-27 LAB — BASIC METABOLIC PANEL
Anion gap: 11 (ref 5–15)
BUN: 84 mg/dL — ABNORMAL HIGH (ref 8–23)
CO2: 23 mmol/L (ref 22–32)
Calcium: 7.6 mg/dL — ABNORMAL LOW (ref 8.9–10.3)
Chloride: 99 mmol/L (ref 98–111)
Creatinine, Ser: 5.85 mg/dL — ABNORMAL HIGH (ref 0.44–1.00)
GFR, Estimated: 7 mL/min — ABNORMAL LOW (ref >60)
Glucose, Bld: 291 mg/dL — ABNORMAL HIGH (ref 70–99)
Potassium: 3.4 mmol/L — ABNORMAL LOW (ref 3.5–5.1)
Sodium: 133 mmol/L — ABNORMAL LOW (ref 135–145)

## 2021-10-27 LAB — GLUCOSE, CAPILLARY
Glucose-Capillary: 301 mg/dL — ABNORMAL HIGH (ref 70–99)
Glucose-Capillary: 196 mg/dL — ABNORMAL HIGH (ref 70–99)
Glucose-Capillary: 137 mg/dL — ABNORMAL HIGH (ref 70–99)
Glucose-Capillary: 175 mg/dL — ABNORMAL HIGH (ref 70–99)

## 2021-10-27 LAB — CBC
HCT: 24.1 % — ABNORMAL LOW (ref 36.0–46.0)
Hemoglobin: 7.7 g/dL — ABNORMAL LOW (ref 12.0–15.0)
MCH: 27.4 pg (ref 26.0–34.0)
MCHC: 32 g/dL (ref 30.0–36.0)
MCV: 85.8 fL (ref 80.0–100.0)
Platelets: 272 10*3/uL (ref 150–400)
RBC: 2.81 MIL/uL — ABNORMAL LOW (ref 3.87–5.11)
RDW: 17.5 % — ABNORMAL HIGH (ref 11.5–15.5)
WBC: 20.9 10*3/uL — ABNORMAL HIGH (ref 4.0–10.5)
nRBC: 0.2 % (ref 0.0–0.2)

## 2021-10-27 LAB — MAGNESIUM: Magnesium: 1.7 mg/dL (ref 1.7–2.4)

## 2021-10-27 NOTE — Progress Notes (Signed)
[facility]  ROUNDING NOTE   Subjective:   [name] was admitted to [facility] on 10/14/2021 for Facial swelling [R22.0] Angioedema, initial encounter [T78.3XXA] Fever, unspecified fever cause [R50.9] Angioedema [T78.3XXA]  Patient is known to our clinic and receives PD management at [facility], supervised by Dr Juleen China.   Patient continues to complain of mouth soreness and dryness Unable to tolerate meals, poor appetite Tolerating nectar thick liquids  Objective:  Vital signs in last 24 hours:  Temp:  [97.3 F (36.3 C)-97.9 F (36.6 C)] 97.3 F (36.3 C) (02/23 0700) Pulse Rate:  [71-78] 75 (02/23 0700) Resp:  [15-17] 17 (02/23 0700) BP: (134-180)/(42-64) 177/61 (02/23 0851) SpO2:  [99 %-100 %] 100 % (02/23 0700) Weight:  [57.6 kg-62.4 kg] 62.4 kg (02/23 0500)  Weight change: -3.9 kg Filed Weights   10/26/21 0500 10/26/21 2107 10/27/21 0500  Weight: 58.7 kg 57.6 kg 62.4 kg    Intake/Output: No intake/output data recorded.   Intake/Output this shift:  No intake/output data recorded.  Physical Exam: General: NAD  Head:  dry oral mucosa   Eyes: Anicteric  Lungs:  Clear to auscultation, normal effort  Heart: Regular rate and rhythm  Abdomen:  Soft, nontender  Extremities:  trace Dependent peripheral edema.  Neurologic: Alert and oriented  Skin: No lesions, generalized ecchymosis   Access: PD catheter    Basic Metabolic Panel: Recent Labs  Lab 10/23/21 0445 10/24/21 0554 10/24/21 1040 10/21/2021 0449 10/26/21 0511 10/27/21 0437  NA 132* 133*  --  132* 134* 133*  K 3.1* 3.7  --  3.8 4.0 3.4*  CL 97* 100  --  98 99 99  CO2 25 25  --  23 22 23   GLUCOSE 364* 138*  --  341* 198* 291*  BUN 69* 78*  --  76* 88* 84*  CREATININE 4.93* 5.55*  --  5.30* 5.92* 5.85*  CALCIUM 7.6* 7.7*  --  7.8* 7.6* 7.6*  MG 1.9 2.0 1.9 1.8 1.9 1.7  PHOS  --   --  5.8* 5.2*  --   --      Liver Function Tests: No results for input(s): AST, ALT, ALKPHOS,  BILITOT, PROT, ALBUMIN in the last 168 hours.  No results for input(s): LIPASE, AMYLASE in the last 168 hours. No results for input(s): AMMONIA in the last 168 hours.  CBC: Recent Labs  Lab 10/23/21 0445 10/24/21 0554 10/11/2021 0449 10/26/21 0511 10/27/21 0437  WBC 14.9* 24.7* 22.7* 23.9* 20.9*  HGB 7.4* 7.9* 8.3* 7.6* 7.7*  HCT 23.9* 25.9* 26.0* 24.2* 24.1*  MCV 86.9 87.2 86.7 87.7 85.8  PLT 205 231 259 256 272     Cardiac Enzymes: No results for input(s): CKTOTAL, CKMB, CKMBINDEX, TROPONINI in the last 168 hours.  BNP: Invalid input(s): POCBNP  CBG: Recent Labs  Lab 10/26/21 0745 10/26/21 1217 10/26/21 1615 10/26/21 2157 10/27/21 0848  GLUCAP 194* 85 65* 117* 301*     Microbiology: Results for orders placed or performed during the hospital encounter of 10/07/2021  Resp Panel by RT-PCR (Flu A&B, Covid) Nasopharyngeal Swab     Status: None   Collection Time: 11/01/2021  5:18 PM   Specimen: Nasopharyngeal Swab; Nasopharyngeal(NP) swabs in vial transport medium  Result Value Ref Range Status   SARS Coronavirus 2 by RT PCR NEGATIVE NEGATIVE Final    Comment: (NOTE) SARS-CoV-2 target nucleic acids are NOT DETECTED.  The SARS-CoV-2 RNA is generally detectable in upper respiratory specimens during the acute phase of  infection. The lowest concentration of SARS-CoV-2 viral copies this assay can detect is 138 copies/mL. A negative result does not preclude SARS-Cov-2 infection and should not be used as the sole basis for treatment or other patient management decisions. A negative result may occur with  improper specimen collection/handling, submission of specimen other than nasopharyngeal swab, presence of viral mutation(s) within the areas targeted by this assay, and inadequate number of viral copies(<138 copies/mL). A negative result must be combined with clinical observations, patient history, and epidemiological information. The expected result is Negative.  Fact  Sheet for Patients:  EntrepreneurPulse.com.au  Fact Sheet for Healthcare Providers:  IncredibleEmployment.be  This test is no t yet approved or cleared by the [place] FDA and  has been authorized for detection and/or diagnosis of SARS-CoV-2 by FDA under an Emergency Use Authorization (EUA). This EUA will remain  in effect (meaning this test can be used) for the duration of the COVID-19 declaration under Section 564(b)(1) of the Act, 21 U.S.C.section 360bbb-3(b)(1), unless the authorization is terminated  or revoked sooner.       Influenza A by PCR NEGATIVE NEGATIVE Final   Influenza B by PCR NEGATIVE NEGATIVE Final    Comment: (NOTE) The Xpert Xpress SARS-CoV-2/FLU/RSV plus assay is intended as an aid in the diagnosis of influenza from Nasopharyngeal swab specimens and should not be used as a sole basis for treatment. Nasal washings and aspirates are unacceptable for Xpert Xpress SARS-CoV-2/FLU/RSV testing.  Fact Sheet for Patients: EntrepreneurPulse.com.au  Fact Sheet for Healthcare Providers: IncredibleEmployment.be  This test is not yet approved or cleared by the [place] FDA and has been authorized for detection and/or diagnosis of SARS-CoV-2 by FDA under an Emergency Use Authorization (EUA). This EUA will remain in effect (meaning this test can be used) for the duration of the COVID-19 declaration under Section 564(b)(1) of the Act, 21 U.S.C. section 360bbb-3(b)(1), unless the authorization is terminated or revoked.  Performed at [facility], [address] [place], [place] [address]   Body fluid culture w Gram Stain     Status: None   Collection Time: 10/08/2021 11:40 PM   Specimen: Peritoneal Washings; Body Fluid  Result Value Ref Range Status   Specimen Description   Final    PERITONEAL DIALYSIS Performed at [facility], [address] [place], [place]  [address]    Special Requests   Final    NONE Performed at [facility], [address] [place], [place] [address]    Gram Stain   Final    NO SQUAMOUS EPITHELIAL CELLS SEEN NO WBC SEEN NO ORGANISMS SEEN    Culture   Final    NO GROWTH 3 DAYS Performed at [facility], [address], [place], [place] [address]    Report Status 10/20/2021 FINAL  Final  MRSA Next Gen by PCR, Nasal     Status: None   Collection Time: 10/18/21  1:40 PM   Specimen: Nasal Mucosa; Nasal Swab  Result Value Ref Range Status   MRSA by PCR Next Gen NOT DETECTED NOT DETECTED Final    Comment: (NOTE) The GeneXpert MRSA Assay (FDA approved for NASAL specimens only), is one component of a comprehensive MRSA colonization surveillance program. It is not intended to diagnose MRSA infection nor to guide or monitor treatment for MRSA infections. Test performance is not FDA approved in patients less than 75 years old. Performed at [facility], [address], [place], [place] [address]  Coagulation Studies: No results for input(s): LABPROT, INR in the last 72 hours.   Urinalysis: No results for input(s): COLORURINE, LABSPEC, PHURINE, GLUCOSEU, HGBUR, BILIRUBINUR, KETONESUR, PROTEINUR, UROBILINOGEN, NITRITE, LEUKOCYTESUR in the last 72 hours.  Invalid input(s): APPERANCEUR    Imaging: No results found.   Medications:    clindamycin (CLEOCIN) IV 600 mg (10/27/21 0537)   dextrose 5 % and 0.9% NaCl 40 mL/hr at 10/26/21 2104   dialysis solution 1.5% low-MG/low-CA      chlorhexidine  15 mL Mouth/Throat Daily   chlorhexidine  15 mL Mouth/Throat QID   Chlorhexidine Gluconate Cloth  6 each Topical Daily   cloNIDine  0.2 mg Transdermal Weekly   darbepoetin (ARANESP) injection - NON-DIALYSIS  100 mcg Subcutaneous Q Thu-1800   dexamethasone (DECADRON) injection  6 mg Intravenous Q24H   feeding supplement (NEPRO CARB STEADY)  237 mL Oral TID   gentamicin cream  1 application  Topical Daily   hydrALAZINE  10 mg Intravenous Q6H   insulin aspart  0-15 Units Subcutaneous TID WC   insulin aspart  0-5 Units Subcutaneous QHS   isosorbide mononitrate  30 mg Oral Daily   magic mouthwash w/lidocaine  10 mL Oral QID   metoprolol tartrate  25 mg Oral BID   multivitamin  1 tablet Oral QHS   pantoprazole (PROTONIX) IV  40 mg Intravenous Q24H   acetaminophen **OR** acetaminophen, benzocaine, hydrALAZINE, HYDROmorphone (DILAUDID) injection, ondansetron **OR** ondansetron (ZOFRAN) IV  Assessment/ Plan:  [name] is a 81 y.o. white female with end stage renal disease on peritoneal dialysis, diabetes mellitus type II insulin dependent, hypertension, and hyperlipidemia who is admitted to [facility] on 10/26/2021 for Facial swelling [R22.0] Angioedema, initial encounter [T78.3XXA] Fever, unspecified fever cause [R50.9] Angioedema [T78.3XXA]  [facility] 59kg CCPD 5 exchanges 2 liter fills 9 hours  End Stage Renal Disease:  Received PD treatment last night, tolerated well. UF 57ml achieved.   Concerns continue with lack of appetite and ability to properly care for self at home. Discussed with patient that it would be in her best interest to discharge to rehab for short period, meaning she would transition to hemodialysis during this time. Patient is agreeable. Consulted vascular and will place permcath tomorrow.  Will initiate hemodialysis after permcath placement. Dialysis coordinator has been following patient and aware of outpatient clinic needs.   No intake/output data recorded.   Hypertension:   ACE inhibitor and ARB initially held due to angioedema. Patient currently receiving clonidine, hydralazine, and metoprolol. Isosorbide prescribed today.   Blood pressure 177/61  Anemia of chronic kidney disease:  Mircera as outpatient given 09/27/2021.    Lab Results  Component Value Date   HGB 7.7 (L) 10/27/2021  Aranesp ordered weekly  (Thursday) Will continue to monitor  4.  Angioedema, believed due to prescribed ACE-I.   Transferred ourt of ICU on 10/20/21.  Continues receiving dexamethasone  ENT following and recommending oral rinsing  5.  Hypokalemia,  Potassium 3.4   LOS: 9 Garrell Flagg 2/23/202311:18 AM

## 2021-10-27 NOTE — TOC Progression Note (Signed)
Transition of Care [facility]) - Progression Note    Patient Details  Name: Catherine Burke MRN: [id number] Date of Birth: September 24, 1940  Transition of Care [facility]) CM/SW [place], RN Phone Number: 10/27/2021, 9:54 AM  Clinical Narrative:   Josem Kaufmann with [employer] started today, patient anticipated to transfer to [place] after Friday    Expected Discharge Plan: [place] Barriers to Discharge: Continued Medical Work up  Expected Discharge Plan and Services Expected Discharge Plan: [place]   Discharge Planning Services: CM Consult   Living arrangements for the past 2 months: Single Family Home                 DME Arranged: N/A DME Agency: NA                   Social Determinants of Health (SDOH) Interventions    Readmission Risk Interventions Readmission Risk Prevention Plan 10/19/2021  Transportation Screening Complete  Medication Review Press photographer) Complete  PCP or Specialist appointment within 3-5 days of discharge Complete  HRI or Home Care Consult Complete  SW Recovery Care/Counseling Consult Complete  Palliative Care Screening Not [place] Complete  Some recent data might be hidden

## 2021-10-27 NOTE — Progress Notes (Addendum)
Patient accepted at [facility] Catherine Burke) MWF 6:45am, and can start on Monday 2/27 at 6:20am if medically ready.

## 2021-10-27 NOTE — Progress Notes (Signed)
PROGRESS NOTE    Catherine Burke  ZJI:[id number] DOB: [birth year] DOA: 10/17/2021 PCP: Idelle Crouch, MD    Brief Narrative:  81 year old with medical history of end-stage renal disease on peritoneal dialysis, hypertension, insulin-dependent diabetes mellitus, who presented emergency department for chief concerns of right facial swelling.   Facial swelling thought to be due to angioedema secondary to ACE inhibitor use.  ACE inhibitor's been discontinued and added to allergy list.   On 2/14, Patient had worsening of her facial and tongue swelling.  Pt transferred to [facility] and PCCM consulted on 2/14 for close monitoring.  Pt was transferred back to the floor after tongue swelling improved.  Hospitalization prolonged due to slow progress back to oral intake and now tongue bleeding.  Angioedema has improved however tongue swelling was an issue.  Seen in consultation by ENT.  No aggressive management recommended.  Tongue swelling has improved but not yet totally resolved.  Facial swelling again has improved but not yet resolved.  Patient having difficulty with p.o. medications and oral intake.  2/23: Patient still not doing well with p.o. intake or oral medications.  Discussed the possibility of image guided NGT placement.  Patient equivocal and request I speak with family.    Assessment & Plan:   Principal Problem:   Angioedema, initial encounter Active Problems:   Hyperlipidemia   Essential hypertension   GERD (gastroesophageal reflux disease)   Chronic kidney disease (CKD), stage IV (severe) (HCC)   ESRD (end stage renal disease) (HCC)   Facial cellulitis   Malnutrition of moderate degree   Angioedema  * Angioedema --thought to be due to home telmisartan s/p -Solu-Medrol 125 mg x 1, Benadryl 25 mg x 1, Pepcid 20 mg IV x1, EpiPen x1 then started on IV decadron 6 mg q6h.  After tongue swelling improved, pt was started on steroid taper. -Difficulty tolerating oral  Decadron Plan: Continue Decadron IV 6 mg daily ARB/ACE inhibitors added to allergy list   Tongue bleeding --became more profuse early morning on 2/20.  From sloughing and possible trauma following angioedema. --ENT performed bedside debridement and suctioning  Plan: Peridex mouth rinses Soft diet as tolerated Appreciate follow-up from ENT Outpatient follow-up 1 week postdischarge   Dysphagia --due to tongue swelling and poor lingual control of boluses and coordination, slowly improving.  Oral intake remains poor Plan: --cont Dys 1 with nectar thick --cont SLP eval --We will discuss with family and consider possible image guided NGT placement   Essential hypertension Poor control over interval --home ARB telmisartan d/c'ed Plan: --cont clonidine patch 0.2 mg -- Continue home Imdur --d/c Lopressor 25 mg BID --IV hydralazine for now   ESRD on peritoneal dialysis (end stage renal disease) ([place]) Currently on peritoneal dialysis.  Vascular surgery was attempting PermCath placement.  This was delayed due to tongue swelling -Patient now more agreeable to SNF placement Plan: Vascular reengaged for Vas-Cath placement   Facial cellulitis, ruled out Sepsis, ruled out Facial cellulitis felt unlikely - DC'ed antibiotics   Anemia of CKD --Mircera as outpatient   Afib, isolated episode --noted early morning 2/15, new dx, likely related to acute illness --started on heparin gtt, since d/c'ed --currently rate controlled and back to NSR   Hypokalemia --monitor and replete PRN   DM2, well controlled Hyperglycemia exacerbated by steroid use --A1c 5.7 -- Discontinued glargine due to hypoglycemia -- Moderate SSI   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  --nonemergent contrast  enhanced brain MRI for further evaluation as outpatient   DVT prophylaxis: SQ Lovenox Code Status: Partial Family Communication: Son via phone on  2/23 Disposition Plan: Status is: Inpatient Remains inpatient appropriate because: Tongue bleeding.  Poor p.o. intake and dysphagia secondary to angioedema.  Will need to tolerate p.o. intake prior to disposition.  Plan for NGT placement   Level of care: Telemetry Medical  Consultants:  Nephrology Vascular surgery ENT  Procedures:  None  Antimicrobials: None   Subjective: Seen and examined.  Facial swelling improved from my prior evaluation.  Hemoglobin stable  Objective: Vitals:   10/27/21 0500 10/27/21 0514 10/27/21 0700 10/27/21 0851  BP:  (!) 134/50 (!) 180/64 (!) 177/61  Pulse:  74 75   Resp:  16 17   Temp:  97.7 F (36.5 C) (!) 97.3 F (36.3 C)   TempSrc:  Oral Oral   SpO2:  100% 100%   Weight: 62.4 kg     Height:        Intake/Output Summary (Last 24 hours) at 10/27/2021 1000 Last data filed at 10/26/2021 1353 Gross per 24 hour  Intake 0 ml  Output --  Net 0 ml   Filed Weights   10/26/21 0500 10/26/21 2107 10/27/21 0500  Weight: 58.7 kg 57.6 kg 62.4 kg    Examination:  General exam: No acute distress.  Fatigued Respiratory system: Lungs clear.  Normal work of breathing.  Room air Cardiovascular system: S1-S2, RRR, no murmurs, no pedal edema Gastrointestinal system: Soft, NT/ND, normal bowel sounds Central nervous system: Alert and oriented. No focal neurological deficits. Extremities: Symmetrical decreased power Skin: No rashes, lesions or ulcers Psychiatry: Judgement and insight appear normal. Mood & affect appropriate.     Data Reviewed: I have personally reviewed following labs and imaging studies  CBC: Recent Labs  Lab 10/23/21 0445 10/24/21 0554 10/31/2021 0449 10/26/21 0511 10/27/21 0437  WBC 14.9* 24.7* 22.7* 23.9* 20.9*  HGB 7.4* 7.9* 8.3* 7.6* 7.7*  HCT 23.9* 25.9* 26.0* 24.2* 24.1*  MCV 86.9 87.2 86.7 87.7 85.8  PLT 205 231 259 256 938   Basic Metabolic Panel: Recent Labs  Lab 10/23/21 0445 10/24/21 0554 10/24/21 1040  10/15/2021 0449 10/26/21 0511 10/27/21 0437  NA 132* 133*  --  132* 134* 133*  K 3.1* 3.7  --  3.8 4.0 3.4*  CL 97* 100  --  98 99 99  CO2 25 25  --  23 22 23   GLUCOSE 364* 138*  --  341* 198* 291*  BUN 69* 78*  --  76* 88* 84*  CREATININE 4.93* 5.55*  --  5.30* 5.92* 5.85*  CALCIUM 7.6* 7.7*  --  7.8* 7.6* 7.6*  MG 1.9 2.0 1.9 1.8 1.9 1.7  PHOS  --   --  5.8* 5.2*  --   --    GFR: Estimated Creatinine Clearance: 6.4 mL/min (A) (by C-G formula based on SCr of 5.85 mg/dL (H)). Liver Function Tests: No results for input(s): AST, ALT, ALKPHOS, BILITOT, PROT, ALBUMIN in the last 168 hours. No results for input(s): LIPASE, AMYLASE in the last 168 hours. No results for input(s): AMMONIA in the last 168 hours. Coagulation Profile: No results for input(s): INR, PROTIME in the last 168 hours. Cardiac Enzymes: No results for input(s): CKTOTAL, CKMB, CKMBINDEX, TROPONINI in the last 168 hours. BNP (last 3 results) No results for input(s): PROBNP in the last 8760 hours. HbA1C: No results for input(s): HGBA1C in the last 72 hours. CBG: Recent Labs  Lab  10/26/21 0745 10/26/21 1217 10/26/21 1615 10/26/21 2157 10/27/21 0848  GLUCAP 194* 85 65* 117* 301*   Lipid Profile: No results for input(s): CHOL, HDL, LDLCALC, TRIG, CHOLHDL, LDLDIRECT in the last 72 hours. Thyroid Function Tests: No results for input(s): TSH, T4TOTAL, FREET4, T3FREE, THYROIDAB in the last 72 hours. Anemia Panel: No results for input(s): VITAMINB12, FOLATE, FERRITIN, TIBC, IRON, RETICCTPCT in the last 72 hours. Sepsis Labs: No results for input(s): PROCALCITON, LATICACIDVEN in the last 168 hours.  Recent Results (from the past 240 hour(s))  MRSA Next Gen by PCR, Nasal     Status: None   Collection Time: 10/18/21  1:40 PM   Specimen: Nasal Mucosa; Nasal Swab  Result Value Ref Range Status   MRSA by PCR Next Gen NOT DETECTED NOT DETECTED Final    Comment: (NOTE) The GeneXpert MRSA Assay (FDA approved for NASAL  specimens only), is one component of a comprehensive MRSA colonization surveillance program. It is not intended to diagnose MRSA infection nor to guide or monitor treatment for MRSA infections. Test performance is not FDA approved in patients less than 53 years old. Performed at [facility], [address] [place], [place] [address]          Radiology Studies: ABORTED INVASIVE LAB PROCEDURE  Result Date: 10/27/2021 See surgical note for result.       Scheduled Meds:  chlorhexidine  15 mL Mouth/Throat Daily   chlorhexidine  15 mL Mouth/Throat QID   Chlorhexidine Gluconate Cloth  6 each Topical Daily   cloNIDine  0.2 mg Transdermal Weekly   darbepoetin (ARANESP) injection - NON-DIALYSIS  100 mcg Subcutaneous Q Thu-1800   dexamethasone (DECADRON) injection  6 mg Intravenous Q24H   feeding supplement (NEPRO CARB STEADY)  237 mL Oral TID   gentamicin cream  1 application Topical Daily   hydrALAZINE  10 mg Intravenous Q6H   insulin aspart  0-15 Units Subcutaneous TID WC   insulin aspart  0-5 Units Subcutaneous QHS   isosorbide mononitrate  30 mg Oral Daily   magic mouthwash w/lidocaine  10 mL Oral QID   metoprolol tartrate  25 mg Oral BID   multivitamin  1 tablet Oral QHS   pantoprazole (PROTONIX) IV  40 mg Intravenous Q24H   Continuous Infusions:  clindamycin (CLEOCIN) IV 600 mg (10/27/21 0537)   dextrose 5 % and 0.9% NaCl 40 mL/hr at 10/26/21 2104   dialysis solution 1.5% low-MG/low-CA       LOS: 9 days      Sidney Ace, MD [facility]   If 7PM-7AM, please contact night-coverage  10/27/2021, 10:00 AM

## 2021-10-27 NOTE — Progress Notes (Signed)
[employer] Nurse reports concern with meds due lopressor and hydralazine with BP 133/48 and heart rate 81 Lopressor discontinued as per attendings direction in rounding note

## 2021-10-28 ENCOUNTER — Inpatient Hospital Stay: Admission: EM | Disposition: E | Payer: Self-pay | Source: Home / Self Care | Attending: Internal Medicine

## 2021-10-28 ENCOUNTER — Encounter: Payer: Self-pay | Admitting: Vascular Surgery

## 2021-10-28 DIAGNOSIS — Z515 Encounter for palliative care: Secondary | ICD-10-CM | POA: Diagnosis not present

## 2021-10-28 DIAGNOSIS — T783XXA Angioneurotic edema, initial encounter: Secondary | ICD-10-CM | POA: Diagnosis not present

## 2021-10-28 DIAGNOSIS — Z7189 Other specified counseling: Secondary | ICD-10-CM

## 2021-10-28 DIAGNOSIS — N186 End stage renal disease: Secondary | ICD-10-CM | POA: Diagnosis not present

## 2021-10-28 HISTORY — PX: DIALYSIS/PERMA CATHETER INSERTION: CATH118288

## 2021-10-28 LAB — CBC
HCT: 23.7 % — ABNORMAL LOW (ref 36.0–46.0)
Hemoglobin: 7.4 g/dL — ABNORMAL LOW (ref 12.0–15.0)
MCH: 27.2 pg (ref 26.0–34.0)
MCHC: 31.2 g/dL (ref 30.0–36.0)
MCV: 87.1 fL (ref 80.0–100.0)
Platelets: 242 10*3/uL (ref 150–400)
RBC: 2.72 MIL/uL — ABNORMAL LOW (ref 3.87–5.11)
RDW: 18.1 % — ABNORMAL HIGH (ref 11.5–15.5)
WBC: 21.1 10*3/uL — ABNORMAL HIGH (ref 4.0–10.5)
nRBC: 0.2 % (ref 0.0–0.2)

## 2021-10-28 LAB — BASIC METABOLIC PANEL
Anion gap: 13 (ref 5–15)
BUN: 84 mg/dL — ABNORMAL HIGH (ref 8–23)
CO2: 22 mmol/L (ref 22–32)
Calcium: 7.3 mg/dL — ABNORMAL LOW (ref 8.9–10.3)
Chloride: 98 mmol/L (ref 98–111)
Creatinine, Ser: 6.15 mg/dL — ABNORMAL HIGH (ref 0.44–1.00)
GFR, Estimated: 6 mL/min — ABNORMAL LOW (ref >60)
Glucose, Bld: 222 mg/dL — ABNORMAL HIGH (ref 70–99)
Potassium: 3.3 mmol/L — ABNORMAL LOW (ref 3.5–5.1)
Sodium: 133 mmol/L — ABNORMAL LOW (ref 135–145)

## 2021-10-28 LAB — MAGNESIUM
Magnesium: 1.7 mg/dL (ref 1.7–2.4)
Magnesium: 1.7 mg/dL (ref 1.7–2.4)
Magnesium: 1.7 mg/dL (ref 1.7–2.4)

## 2021-10-28 LAB — GLUCOSE, CAPILLARY
Glucose-Capillary: 233 mg/dL — ABNORMAL HIGH (ref 70–99)
Glucose-Capillary: 244 mg/dL — ABNORMAL HIGH (ref 70–99)
Glucose-Capillary: 228 mg/dL — ABNORMAL HIGH (ref 70–99)
Glucose-Capillary: 109 mg/dL — ABNORMAL HIGH (ref 70–99)
Glucose-Capillary: 222 mg/dL — ABNORMAL HIGH (ref 70–99)

## 2021-10-28 LAB — PHOSPHORUS
Phosphorus: 6.3 mg/dL — ABNORMAL HIGH (ref 2.5–4.6)
Phosphorus: 5.7 mg/dL — ABNORMAL HIGH (ref 2.5–4.6)

## 2021-10-28 SURGERY — DIALYSIS/PERMA CATHETER INSERTION
Anesthesia: Moderate Sedation

## 2021-10-28 MED ORDER — FREE WATER
80.0000 mL | Freq: Four times a day (QID) | Status: DC
  Administered 2021-10-28 – 2021-11-03 (×22): 80 mL

## 2021-10-28 MED ORDER — FENTANYL CITRATE PF 50 MCG/ML IJ SOSY
PREFILLED_SYRINGE | INTRAMUSCULAR | Status: AC
  Filled 2021-10-28: qty 2

## 2021-10-28 MED ORDER — CLINDAMYCIN PHOSPHATE 300 MG/50ML IV SOLN
INTRAVENOUS | Status: AC
  Filled 2021-10-28: qty 50

## 2021-10-28 MED ORDER — HYDROMORPHONE HCL 1 MG/ML IJ SOLN: 1.0000 mg | Freq: Once | INTRAMUSCULAR | Status: DC | PRN

## 2021-10-28 MED ORDER — SODIUM CHLORIDE 0.9 % IV SOLN: 100.0000 mL | INTRAVENOUS | Status: DC | PRN

## 2021-10-28 MED ORDER — VITAL HIGH PROTEIN PO LIQD: 1000.0000 mL | ORAL | Status: DC

## 2021-10-28 MED ORDER — FENTANYL CITRATE (PF) 100 MCG/2ML IJ SOLN
INTRAMUSCULAR | Status: DC | PRN
  Administered 2021-10-28: 25 ug via INTRAVENOUS

## 2021-10-28 MED ORDER — ONDANSETRON HCL 4 MG/2ML IJ SOLN: 4.0000 mg | Freq: Four times a day (QID) | INTRAMUSCULAR | Status: DC | PRN

## 2021-10-28 MED ORDER — OSMOLITE 1.2 CAL PO LIQD
1000.0000 mL | ORAL | Status: DC
  Administered 2021-10-28 – 2021-10-30 (×2): 1000 mL

## 2021-10-28 MED ORDER — INSULIN ASPART 100 UNIT/ML IJ SOLN
INTRAMUSCULAR | Status: AC
  Filled 2021-10-28: qty 1

## 2021-10-28 MED ORDER — METHYLPREDNISOLONE SODIUM SUCC 125 MG IJ SOLR
125.0000 mg | Freq: Once | INTRAMUSCULAR | Status: DC | PRN
Start: 2021-10-28 — End: 2021-10-28

## 2021-10-28 MED ORDER — MIDAZOLAM HCL 2 MG/2ML IJ SOLN
INTRAMUSCULAR | Status: DC | PRN
  Administered 2021-10-28: 1 mg via INTRAVENOUS

## 2021-10-28 MED ORDER — DIPHENHYDRAMINE HCL 50 MG/ML IJ SOLN: 50.0000 mg | Freq: Once | INTRAMUSCULAR | Status: DC | PRN

## 2021-10-28 MED ORDER — HEPARIN SODIUM (PORCINE) 1000 UNIT/ML DIALYSIS: 1000.0000 [IU] | INTRAMUSCULAR | Status: DC | PRN

## 2021-10-28 MED ORDER — FAMOTIDINE 20 MG PO TABS: 40.0000 mg | ORAL_TABLET | Freq: Once | ORAL | Status: DC | PRN

## 2021-10-28 MED ORDER — LIDOCAINE HCL (PF) 1 % IJ SOLN: 5.0000 mL | INTRAMUSCULAR | Status: DC | PRN

## 2021-10-28 MED ORDER — SODIUM CHLORIDE 0.9 % IV SOLN
INTRAVENOUS | Status: DC
Start: 2021-10-28 — End: 2021-10-28

## 2021-10-28 MED ORDER — MIDAZOLAM HCL 2 MG/2ML IJ SOLN
INTRAMUSCULAR | Status: AC
  Filled 2021-10-28: qty 4

## 2021-10-28 MED ORDER — LIDOCAINE-PRILOCAINE 2.5-2.5 % EX CREA: 1.0000 "application " | TOPICAL_CREAM | CUTANEOUS | Status: DC | PRN

## 2021-10-28 MED ORDER — MIDAZOLAM HCL 2 MG/ML PO SYRP: 8.0000 mg | ORAL_SOLUTION | Freq: Once | ORAL | Status: DC | PRN

## 2021-10-28 MED ORDER — PENTAFLUOROPROP-TETRAFLUOROETH EX AERO: 1.0000 "application " | INHALATION_SPRAY | CUTANEOUS | Status: DC | PRN

## 2021-10-28 MED ORDER — CLINDAMYCIN PHOSPHATE 300 MG/50ML IV SOLN
300.0000 mg | Freq: Once | INTRAVENOUS | Status: DC
  Filled 2021-10-28: qty 50

## 2021-10-28 MED ORDER — ALTEPLASE 2 MG IJ SOLR: 2.0000 mg | Freq: Once | INTRAMUSCULAR | Status: DC | PRN

## 2021-10-28 SURGICAL SUPPLY — 7 items
CATH CANNON HEMO 15FR 19 (HEMODIALYSIS SUPPLIES) ×1 IMPLANT
COVER PROBE U/S 5X48 (MISCELLANEOUS) ×1 IMPLANT
DERMABOND ADVANCED (GAUZE/BANDAGES/DRESSINGS) ×1
DERMABOND ADVANCED .7 DNX12 (GAUZE/BANDAGES/DRESSINGS) IMPLANT
PACK ANGIOGRAPHY (CUSTOM PROCEDURE TRAY) ×1 IMPLANT
SUT MNCRL AB 4-0 PS2 18 (SUTURE) ×1 IMPLANT
SUT PROLENE 0 CT 1 30 (SUTURE) ×1 IMPLANT

## 2021-10-28 NOTE — Progress Notes (Addendum)
Nutrition Follow-up  DOCUMENTATION CODES:   Non-severe (moderate) malnutrition in context of chronic illness  INTERVENTION:   -Once NGT is placed,   -If NGT is placed, recommend:   Initiate Osmolite 1.2 @ 25 ml/hr and increase by 10 ml every 12 hours to goal rate of 55 ml/hr.    80 ml free water flush every 6 hours   Tube feeding regimen provides 1584 kcal, 73 grams of protein, and 1082 ml of H2O. Total free water: 1402 ml daily   -If feedings are initiated, monitor K, Mg, and Phos daily and replete as needed due to high refeeding risk   NUTRITION DIAGNOSIS:   Moderate Malnutrition related to chronic illness (ESRD on PD) as evidenced by mild fat depletion, mild muscle depletion.  Ongoing  GOAL:   Patient will meet greater than or equal to 90% of their needs  Unmet  MONITOR:   Diet advancement, Labs, Weight trends, Skin, I & O's  REASON FOR ASSESSMENT:   Malnutrition Screening Tool    ASSESSMENT:   81 y/o female with h/o ESRD on PD, DM, HTN and HLD who is admitted with lip swelling and angioedema.  2/18- advanced to nectar thick clear liquid diet with Nepro allowed 2/20- advanced to dysphagia 1 diet with nectar thick liquids 2/24- s/p HD cath placement  Reviewed I/O's: -307 ml x 24 hours and -6.3 L since admission  UOP: 400 ml x 24 hours   Pt NPO; noted pt undergoing HD cath placement.   Pt and family amenable to NGT placement under fluoroscopy. RN has tried to place tube unsuccessfully at bedside. Pt remains with very poor oral intake and dysphagia secondary to angioedema.   Per TOC notes, plan to d.c to SNF once medically stable. Pt will transition to iHD to be able to secure SNF placement.   Medications reviewed and include aranesp and decadron.   Labs reviewed: Na: 133, K: 3.3, CBGS: 137-301 (inpatient orders for glycemic control are 0-15 units insulin aspart TID with meals and 0-5 units insulin aspart daily at bedtime).    Diet Order:   Diet Order      None       EDUCATION NEEDS:   Education needs have been addressed  Skin:  Skin Assessment: Skin Integrity Issues: Skin Integrity Issues:: DTI DTI: rt and lt sacrum  Last BM:  10/24/21  Height:   Ht Readings from Last 1 Encounters:  10/18/21 5\' 4"  (1.626 m)    Weight:   Wt Readings from Last 1 Encounters:  10/27/21 62.4 kg    Ideal Body Weight:  54.5 kg  BMI:  Body mass index is 23.61 kg/m.  Estimated Nutritional Needs:   Kcal:  1400-1600kcal/day  Protein:  70-80g/day  Fluid:  UOP +1L    Loistine Chance, RD, LDN, [place] Registered Dietitian II Certified Diabetes Care and Education Specialist Please refer to AMION for RD and/or RD on-call/weekend/after hours pager

## 2021-10-28 NOTE — Progress Notes (Addendum)
[facility]  ROUNDING NOTE   Subjective:   [name] was admitted to [facility] on 10/06/2021 for Facial swelling [R22.0] Angioedema, initial encounter [T78.3XXA] Fever, unspecified fever cause [R50.9] Angioedema [T78.3XXA]  Patient is known to our clinic and receives PD management at [facility], supervised by Dr Juleen China.   Patient seen after procedure Remains drowsy Denies pain and discomfort  Hemodialysis scheduled later today  Objective:  Vital signs in last 24 hours:  Temp:  [97.5 F (36.4 C)-98.1 F (36.7 C)] 97.5 F (36.4 C) (02/24 0719) Pulse Rate:  [0-95] 81 (02/24 0845) Resp:  [11-20] 11 (02/24 0915) BP: (115-150)/(34-82) 129/41 (02/24 0915) SpO2:  [97 %-100 %] 97 % (02/24 0845)  Weight change:  Filed Weights   10/26/21 0500 10/26/21 2107 10/27/21 0500  Weight: 58.7 kg 57.6 kg 62.4 kg    Intake/Output: I/O last 3 completed shifts: In: 10000 [Other:10000] Out: 43154 [Urine:400; MGQQP:6195]   Intake/Output this shift:  No intake/output data recorded.  Physical Exam: General: NAD  Head: dry oral mucosa   Eyes: Anicteric  Lungs:  Clear to auscultation, normal effort  Heart: Regular rate and rhythm  Abdomen:  Soft, nontender  Extremities:  trace Dependent peripheral edema.  Neurologic: Alert and oriented  Skin: No lesions, generalized ecchymosis   Access: PD catheter, Rt IJ Permcath placed on 10/07/2021 by Dr Lucky Cowboy    Basic Metabolic Panel: Recent Labs  Lab 10/24/21 0554 10/24/21 1040 10/27/2021 0449 10/26/21 0511 10/27/21 0437 10/26/2021 0509  NA 133*  --  132* 134* 133* 133*  K 3.7  --  3.8 4.0 3.4* 3.3*  CL 100  --  98 99 99 98  CO2 25  --  23 22 23 22   GLUCOSE 138*  --  341* 198* 291* 222*  BUN 78*  --  76* 88* 84* 84*  CREATININE 5.55*  --  5.30* 5.92* 5.85* 6.15*  CALCIUM 7.7*  --  7.8* 7.6* 7.6* 7.3*  MG 2.0 1.9 1.8 1.9 1.7 1.7  PHOS  --  5.8* 5.2*  --   --   --      Liver Function Tests: No results for input(s):  AST, ALT, ALKPHOS, BILITOT, PROT, ALBUMIN in the last 168 hours.  No results for input(s): LIPASE, AMYLASE in the last 168 hours. No results for input(s): AMMONIA in the last 168 hours.  CBC: Recent Labs  Lab 10/24/21 0554 10/29/2021 0449 10/26/21 0511 10/27/21 0437 11/01/2021 0509  WBC 24.7* 22.7* 23.9* 20.9* 21.1*  HGB 7.9* 8.3* 7.6* 7.7* 7.4*  HCT 25.9* 26.0* 24.2* 24.1* 23.7*  MCV 87.2 86.7 87.7 85.8 87.1  PLT 231 259 256 272 242     Cardiac Enzymes: No results for input(s): CKTOTAL, CKMB, CKMBINDEX, TROPONINI in the last 168 hours.  BNP: Invalid input(s): POCBNP  CBG: Recent Labs  Lab 10/27/21 1215 10/27/21 1651 10/27/21 2009 10/29/2021 0729 10/15/2021 0901  GLUCAP 196* 137* 175* 233* 244*     Microbiology: Results for orders placed or performed during the hospital encounter of 10/17/2021  Resp Panel by RT-PCR (Flu A&B, Covid) Nasopharyngeal Swab     Status: None   Collection Time: 10/24/2021  5:18 PM   Specimen: Nasopharyngeal Swab; Nasopharyngeal(NP) swabs in vial transport medium  Result Value Ref Range Status   SARS Coronavirus 2 by RT PCR NEGATIVE NEGATIVE Final    Comment: (NOTE) SARS-CoV-2 target nucleic acids are NOT DETECTED.  The SARS-CoV-2 RNA is generally detectable in upper respiratory specimens during the acute  phase of infection. The lowest concentration of SARS-CoV-2 viral copies this assay can detect is 138 copies/mL. A negative result does not preclude SARS-Cov-2 infection and should not be used as the sole basis for treatment or other patient management decisions. A negative result may occur with  improper specimen collection/handling, submission of specimen other than nasopharyngeal swab, presence of viral mutation(s) within the areas targeted by this assay, and inadequate number of viral copies(<138 copies/mL). A negative result must be combined with clinical observations, patient history, and epidemiological information. The expected result  is Negative.  Fact Sheet for Patients:  EntrepreneurPulse.com.au  Fact Sheet for Healthcare Providers:  IncredibleEmployment.be  This test is no t yet approved or cleared by the [place] FDA and  has been authorized for detection and/or diagnosis of SARS-CoV-2 by FDA under an Emergency Use Authorization (EUA). This EUA will remain  in effect (meaning this test can be used) for the duration of the COVID-19 declaration under Section 564(b)(1) of the Act, 21 U.S.C.section 360bbb-3(b)(1), unless the authorization is terminated  or revoked sooner.       Influenza A by PCR NEGATIVE NEGATIVE Final   Influenza B by PCR NEGATIVE NEGATIVE Final    Comment: (NOTE) The Xpert Xpress SARS-CoV-2/FLU/RSV plus assay is intended as an aid in the diagnosis of influenza from Nasopharyngeal swab specimens and should not be used as a sole basis for treatment. Nasal washings and aspirates are unacceptable for Xpert Xpress SARS-CoV-2/FLU/RSV testing.  Fact Sheet for Patients: EntrepreneurPulse.com.au  Fact Sheet for Healthcare Providers: IncredibleEmployment.be  This test is not yet approved or cleared by the [place] FDA and has been authorized for detection and/or diagnosis of SARS-CoV-2 by FDA under an Emergency Use Authorization (EUA). This EUA will remain in effect (meaning this test can be used) for the duration of the COVID-19 declaration under Section 564(b)(1) of the Act, 21 U.S.C. section 360bbb-3(b)(1), unless the authorization is terminated or revoked.  Performed at [facility] [address] [place], [place] [address]   Body fluid culture w Gram Stain     Status: None   Collection Time: 10/12/2021 11:40 PM   Specimen: Peritoneal Washings; Body Fluid  Result Value Ref Range Status   Specimen Description   Final    PERITONEAL DIALYSIS Performed at [facility], [address] [place], [place] [address]    Special Requests   Final    NONE Performed at [facility], [address] [place], [place] [address]    Gram Stain   Final    NO SQUAMOUS EPITHELIAL CELLS SEEN NO WBC SEEN NO ORGANISMS SEEN    Culture   Final    NO GROWTH 3 DAYS Performed at [facility], [address], [place], [place] [address]    Report Status 10/20/2021 FINAL  Final  MRSA Next Gen by PCR, Nasal     Status: None   Collection Time: 10/18/21  1:40 PM   Specimen: Nasal Mucosa; Nasal Swab  Result Value Ref Range Status   MRSA by PCR Next Gen NOT DETECTED NOT DETECTED Final    Comment: (NOTE) The GeneXpert MRSA Assay (FDA approved for NASAL specimens only), is one component of a comprehensive MRSA colonization surveillance program. It is not intended to diagnose MRSA infection nor to guide or monitor treatment for MRSA infections. Test performance is not FDA approved in patients less than 63 years old. Performed at [facility], [address] [place],  [address]  Coagulation Studies: No results for input(s): LABPROT, INR in the last 72 hours.   Urinalysis: No results for input(s): COLORURINE, LABSPEC, PHURINE, GLUCOSEU, HGBUR, BILIRUBINUR, KETONESUR, PROTEINUR, UROBILINOGEN, NITRITE, LEUKOCYTESUR in the last 72 hours.  Invalid input(s): APPERANCEUR    Imaging: PERIPHERAL VASCULAR CATHETERIZATION  Result Date: 10/14/2021 See surgical note for result.  DG Naso G Tube Plc W/Fl W/Rad  Result Date: 10/27/2021 CLINICAL DATA:  Poor nutrition EXAM: NASO G TUBE PLACEMENT WITH FL AND WITH RAD FLUOROSCOPY: Fluoroscopy Time:  <0.1 minute Radiation Exposure Index (if provided by the fluoroscopic device): 0.2 mGy Number of Acquired Spot Images: 0 COMPARISON:  None. FINDINGS: 13 French nasogastric tube was inserted through the right nostril and advanced into the stomach past a hiatal hernia without complication. Patient tolerated the procedure well.  No immediate complications. IMPRESSION: Successful placement of 14 French nasogastric tube. Electronically Signed   By: Kathreen Devoid M.D.   On: 10/27/2021 11:48     Medications:    clindamycin (CLEOCIN) IV 600 mg (10/17/2021 0536)   dextrose 5 % and 0.9% NaCl 40 mL/hr at 10/27/21 1812   dialysis solution 1.5% low-MG/low-CA      chlorhexidine  15 mL Mouth/Throat Daily   chlorhexidine  15 mL Mouth/Throat QID   Chlorhexidine Gluconate Cloth  6 each Topical Daily   cloNIDine  0.2 mg Transdermal Weekly   darbepoetin (ARANESP) injection - NON-DIALYSIS  100 mcg Subcutaneous Q Thu-1800   dexamethasone (DECADRON) injection  6 mg Intravenous Q24H   feeding supplement (NEPRO CARB STEADY)  237 mL Oral TID   fentaNYL       gentamicin cream  1 application Topical Daily   hydrALAZINE  10 mg Intravenous Q6H   insulin aspart       insulin aspart  0-15 Units Subcutaneous TID WC   insulin aspart  0-5 Units Subcutaneous QHS   isosorbide mononitrate  30 mg Oral Daily   magic mouthwash w/lidocaine  10 mL Oral QID   midazolam       multivitamin  1 tablet Oral QHS   pantoprazole (PROTONIX) IV  40 mg Intravenous Q24H   acetaminophen **OR** acetaminophen, benzocaine, hydrALAZINE, HYDROmorphone (DILAUDID) injection, HYDROmorphone (DILAUDID) injection, ondansetron **OR** ondansetron (ZOFRAN) IV, ondansetron (ZOFRAN) IV  Assessment/ Plan:  Ms. BAO COREAS is a 81 y.o. white female with end stage renal disease on peritoneal dialysis, diabetes mellitus type II insulin dependent, hypertension, and hyperlipidemia who is admitted to [facility] on 10/10/2021 for Facial swelling [R22.0] Angioedema, initial encounter [T78.3XXA] Fever, unspecified fever cause [R50.9] Angioedema [T78.3XXA]  [facility] 59kg CCPD 5 exchanges 2 liter fills 9 hours  End Stage Renal Disease:  Receives nightly PD treatments, but due to plans for rehab placement after discharge. Appreciate vascular surgery  placing HD permcath today for hemodialysis. Attempted to dialyze patient however increased pressures prevented this. This may be due to inflammation from insertion. Will return patient to room and re-attempt tomorrow.   Dialysis coordinator has confirmed outpatient clinic at [facility] on MWF schedule.  02/23 0701 - 02/24 0700 In: 10000  Out: 32671 [Urine:400]   Hypertension:   ACE inhibitor and ARB initially held due to angioedema. Patient currently receiving clonidine, hydralazine, and metoprolol. Isosorbide prescribed today.   Blood pressure 129/41  Anemia of chronic kidney disease:  Mircera as outpatient given 09/27/2021.    Lab Results  Component Value Date   HGB 7.4 (L) 10/11/2021  Aranesp ordered weekly (Thursday) Hgb below target, may transition to IV  EPO with treatments  4.  Angioedema, believed due to prescribed ACE-I.   Transferred ourt of ICU on 10/20/21.  Continues receiving dexamethasone  ENT following and recommending oral rinsing  5.  Hypokalemia,  Potassium 3.3, will correct this with dialysis   LOS: 10 Aman Batley 2/24/20239:51 AM

## 2021-10-28 NOTE — Op Note (Signed)
OPERATIVE NOTE    PRE-OPERATIVE DIAGNOSIS: 1. ESRD   POST-OPERATIVE DIAGNOSIS: same as above  PROCEDURE: Ultrasound guidance for vascular access to the right internal jugular vein Fluoroscopic guidance for placement of catheter Placement of a 19 cm tip to cuff tunneled hemodialysis catheter via the right internal jugular vein  SURGEON: Leotis Pain, MD  ANESTHESIA:  Local with Moderate conscious sedation for approximately 16 minutes using 1 mg of Versed and 25 mcg of Fentanyl  ESTIMATED BLOOD LOSS: 5 cc  FLUORO TIME: less than one minute  CONTRAST: none  FINDING(S): 1.  Patent right internal jugular vein  SPECIMEN(S):  None  INDICATIONS:   Catherine Burke is a 81 y.o.female who presents with renal failure and need for HD instead of PD.  The patient needs long term dialysis access for their ESRD, and a Permcath is necessary.  Risks and benefits are discussed and informed consent is obtained.    DESCRIPTION: After obtaining full informed written consent, the patient was brought back to the vascular suited. The patient's right neck and chest were sterilely prepped and draped in a sterile surgical field was created. Moderate conscious sedation was administered during a face to face encounter with the patient throughout the procedure with my supervision of the RN administering medicines and monitoring the patient's vital signs, pulse oximetry, telemetry and mental status throughout from the start of the procedure until the patient was taken to the recovery room.  The right internal jugular vein was visualized with ultrasound and found to be patent. It was then accessed under direct ultrasound guidance and a permanent image was recorded. A wire was placed. After skin nick and dilatation, the peel-away sheath was placed over the wire. I then turned my attention to an area under the clavicle. Approximately 1-2 fingerbreadths below the clavicle a small counterincision was created and tunneled  from the subclavicular incision to the access site. Using fluoroscopic guidance, a 19 centimeter tip to cuff tunneled hemodialysis catheter was selected, and tunneled from the subclavicular incision to the access site. It was then placed through the peel-away sheath and the peel-away sheath was removed. Using fluoroscopic guidance the catheter tips were parked in the right atrium. The appropriate distal connectors were placed. It withdrew blood well and flushed easily with heparinized saline and a concentrated heparin solution was then placed. It was secured to the chest wall with 2 Prolene sutures. The access incision was closed single 4-0 Monocryl. A 4-0 Monocryl pursestring suture was placed around the exit site. Sterile dressings were placed. The patient tolerated the procedure well and was taken to the recovery room in stable condition.  COMPLICATIONS: None  CONDITION: Stable  Leotis Pain, MD 10/29/2021 8:30 AM   This note was created with Dragon Medical transcription system. Any errors in dictation are purely unintentional.

## 2021-10-28 NOTE — Consult Note (Signed)
Consultation Note Date: 10/23/2021   Patient Name: Catherine Burke  DOB: 1940/11/05  MRN: [id number]  Age / Sex: 81 y.o., female  PCP: Idelle Crouch, MD Referring Physician: Sidney Ace, MD  Reason for Consultation: Establishing goals of care  HPI/Patient Profile: 81 y.o. female  with past medical history of ESRD on PD, htn, and IDDM admitted on 10/21/2021 with facial swelling.  Facial swelling thought to be due to angioedema secondary to ACE inhibitor use.  ACE inhibitor's been discontinued and added to allergy list. Hospitalization prolonged due to slow progress back to oral intake and tongue bleeding. Patient struggling with PO intake d/t pain - NG tube placed for nutrition. Patient also with dialysis cath placed 2/24 and plans to switch to HD as she plans for rehab at discharge. PMT consulted to discuss [place].   Clinical Assessment and Goals of Care: I have reviewed medical records including EPIC notes, labs and imaging, received report from Dr. Priscella Mann, assessed the patient and then met with patient and spoke with son Jenny Reichmann via telephone to discuss diagnosis prognosis, [place], EOL wishes, disposition and options.  Only able to briefly see patient as she was being taken to HD - her tongue is wrapped in gauze so she cannot verbalize but she gives me a thumbs up when I ask her how she is doing. We discuss plans to go to HD and she nods in understanding. She nods when I tell her my plan to call her son to further discuss her care.   I introduced Palliative Medicine as specialized medical care for people living with serious illness. It focuses on providing relief from the symptoms and stress of a serious illness. The goal is to improve quality of life for both the patient and the family.  We discussed a brief life review of the patient. Jenny Reichmann tells me patient lives alone but he lives next door to her and provides assistance. He tells me prior to  admission patient was able to care for herself independently. She did use a cane for ambulation. She tells me of a declining appetite. He tells me her mind was clear. Son shares that patient's spouse passed away last 04-04-23 and she has struggled since that time.    We discussed patient's current illness and what it means in the larger context of patient's on-going co-morbidities. We discussed her persistent oral pain and inability to maintain adequate nutrition. Discussed placement of NG tube for artificial nutrition. Discussed this is a temporary measures and not something she can leave the hospital with. Discussed her ability to take in POs will likely guide when she is able to leave the hospital. We discuss placement of HD cath today and initiation of HD. Discuss plan to dc to rehab. John agrees to plan of care.   Discussed with Jenny Reichmann the importance of continued conversation with family and the medical providers regarding overall plan of care and treatment options, ensuring decisions are within the context of the patients values and GOCs.    Palliative Care services outpatient were explained and offered.  Questions and concerns were addressed. The family was encouraged to call with questions or concerns.   Primary Decision Maker PATIENT 46 of adult children if patient unable   SUMMARY OF RECOMMENDATIONS   - continue current plan of care, HD starting today, plans to dc to rehab - family understands expectation of long recovery -**Discharging physician please write for palliative care follow up at [place]** - PMT to follow  Code Status/Advance Care Planning: Limited code - intubation okay  Discharge Planning: [place] for rehab with Palliative care service follow-up      Primary Diagnoses: Present on Admission:  Angioedema, initial encounter  Facial cellulitis  Chronic kidney disease (CKD), stage IV (severe) (HCC)  GERD (gastroesophageal reflux disease)   Hyperlipidemia  Essential hypertension  ESRD (end stage renal disease) ([place])  Angioedema   I have reviewed the medical record, interviewed the patient and family, and examined the patient. The following aspects are pertinent.  Past Medical History:  Diagnosis Date   Anemia    Arthritis    B12 deficiency 04/04/2021   Diabetes mellitus    GERD (gastroesophageal reflux disease)    HLD (hyperlipidemia)    Hypercholesterolemia    Hypertension    Kidney stones    Osteoporosis    Proteinuria    Social History   Socioeconomic History   Marital status: Widowed    Spouse name: Not on file   Number of children: 3   Years of education: Not on file   Highest education level: Not on file  Occupational History   Occupation: retired  Tobacco Use   Smoking status: Never   Smokeless tobacco: Never  Vaping Use   Vaping Use: Never used  Substance and Sexual Activity   Alcohol use: No    Alcohol/week: 0.0 standard drinks   Drug use: No   Sexual activity: Yes  Other Topics Concern   Not on file  Social History Narrative   Not on file   Social Determinants of Health   Financial Resource Strain: Not on file  Food Insecurity: Not on file  Transportation Needs: Not on file  Physical Activity: Not on file  Stress: Not on file  Social Connections: Not on file   Family History  Problem Relation Age of Onset   Other Mother    Heart attack Father    Diabetes Maternal Grandmother    Breast cancer Paternal Aunt    Scheduled Meds:  chlorhexidine  15 mL Mouth/Throat Daily   chlorhexidine  15 mL Mouth/Throat QID   Chlorhexidine Gluconate Cloth  6 each Topical Daily   cloNIDine  0.2 mg Transdermal Weekly   darbepoetin (ARANESP) injection - NON-DIALYSIS  100 mcg Subcutaneous Q Thu-1800   dexamethasone (DECADRON) injection  6 mg Intravenous Q24H   feeding supplement (NEPRO CARB STEADY)  237 mL Oral TID   fentaNYL       free water  80 mL Per Tube Q6H   gentamicin cream  1 application  Topical Daily   hydrALAZINE  10 mg Intravenous Q6H   insulin aspart  0-15 Units Subcutaneous TID WC   insulin aspart  0-5 Units Subcutaneous QHS   isosorbide mononitrate  30 mg Oral Daily   magic mouthwash w/lidocaine  10 mL Oral QID   midazolam       multivitamin  1 tablet Oral QHS   pantoprazole (PROTONIX) IV  40 mg Intravenous Q24H   Continuous Infusions:  clindamycin (CLEOCIN) IV 600 mg (10/27/2021 0536)   dialysis solution 1.5% low-MG/low-CA Stopped (10/23/2021 1035)   feeding supplement (OSMOLITE 1.2 CAL)     PRN Meds:.acetaminophen **OR** acetaminophen, benzocaine, hydrALAZINE, HYDROmorphone (DILAUDID) injection, HYDROmorphone (DILAUDID) injection, ondansetron **OR** ondansetron (ZOFRAN) IV, ondansetron (ZOFRAN) IV Allergies  Allergen Reactions   Levofloxacin Other (See Comments)    Throat Swelling    Lisinopril Swelling   Penicillins Anaphylaxis   Telmisartan Swelling    Tongue swelling   Other  Penicillin G Other (See Comments)   Sulfa Antibiotics    Review of Systems  Unable to perform ROS: Patient nonverbal   Physical Exam Constitutional:      General: She is not in acute distress. HENT:     Head:     Comments: Tongue protruding, wrapped in gauze Pulmonary:     Effort: Pulmonary effort is normal.  Skin:    General: Skin is warm and dry.  Neurological:     Mental Status: She is alert and oriented to person, place, and time.    Vital Signs: BP (!) 140/44    Pulse 69    Temp 97.9 F (36.6 C) (Axillary)    Resp 15    Ht 5' 4"  (1.626 m)    Wt 59.4 kg    SpO2 99%    BMI 22.48 kg/m  Pain Scale: 0-10   Pain Score: 0-No pain   SpO2: SpO2: 99 % O2 Device:SpO2: 99 % O2 Flow Rate: .O2 Flow Rate (L/min): 2 L/min  IO: Intake/output summary:  Intake/Output Summary (Last 24 hours) at 10/30/2021 1431 Last data filed at 10/15/2021 0300 Gross per 24 hour  Intake 0 ml  Output 400 ml  Net -400 ml    LBM: Last BM Date : 10/24/21 Baseline Weight: Weight: 54.3  kg Most recent weight: Weight: 59.4 kg     Palliative Assessment/Data: PPS 20%    Juel Burrow, DNP, [facility] Palliative Medicine Team [phone] Pager: [phone]

## 2021-10-28 NOTE — TOC Progression Note (Signed)
Transition of Care [facility]) - Progression Note    Patient Details  Name: Catherine Burke MRN: [id number] Date of Birth: [birth year]  Transition [facility]) CM/SW [place], RN Phone Number: 10/15/2021, 10:44 AM  Clinical Narrative:   Patient has authorization for [employer] until 2/28.  Will monitor for discharge plan.  TOC to follow    Expected Discharge Plan: [place] Barriers to Discharge: Continued Medical Work up  Expected Discharge Plan and Services Expected Discharge Plan: [place]   Discharge Planning Services: CM Consult   Living arrangements for the past 2 months: Single Family Home                 DME Arranged: N/A DME Agency: NA                   Social Determinants of Health (SDOH) Interventions    Readmission Risk Interventions Readmission Risk Prevention Plan 10/19/2021  Transportation Screening Complete  Medication Review Press photographer) Complete  PCP or Specialist appointment within 3-5 days of discharge Complete  HRI or Home Care Consult Complete  SW Recovery Care/Counseling Consult Complete  Palliative Care Screening Not [place] Complete  Some recent data might be hidden

## 2021-10-28 NOTE — Progress Notes (Signed)
PROGRESS NOTE    JOSELYN EDLING  HWK:[id number] DOB: 1940/09/19 DOA: 10/06/2021 PCP: Idelle Crouch, MD    Brief Narrative:  81 year old with medical history of end-stage renal disease on peritoneal dialysis, hypertension, insulin-dependent diabetes mellitus, who presented emergency department for chief concerns of right facial swelling.   Facial swelling thought to be due to angioedema secondary to ACE inhibitor use.  ACE inhibitor's been discontinued and added to allergy list.   On 2/14, Patient had worsening of her facial and tongue swelling.  Pt transferred to [facility] and PCCM consulted on 2/14 for close monitoring.  Pt was transferred back to the floor after tongue swelling improved.  Hospitalization prolonged due to slow progress back to oral intake and now tongue bleeding.  Angioedema has improved however tongue swelling was an issue.  Seen in consultation by ENT.  No aggressive management recommended.  Tongue swelling has improved but not yet totally resolved.  Facial swelling again has improved but not yet resolved.  Patient having difficulty with p.o. medications and oral intake.  2/23: Patient still not doing well with p.o. intake or oral medications.  Discussed the possibility of image guided NGT placement.  Patient equivocal and request I speak with family.  2/24: Patient and family in agreement.  NG tube placed 2/23.  Tube feeds initiated.  Patient had vascular consulted to place Vas-Cath on 2/24.  Having some oozing from catheter site otherwise stable.  Nephrology following for inpatient HD needs.    Assessment & Plan:   Principal Problem:   Angioedema, initial encounter Active Problems:   Hyperlipidemia   Essential hypertension   GERD (gastroesophageal reflux disease)   Chronic kidney disease (CKD), stage IV (severe) (HCC)   ESRD (end stage renal disease) (HCC)   Facial cellulitis   Malnutrition of moderate degree   Angioedema  * Angioedema --thought to be due  to home telmisartan s/p -Solu-Medrol 125 mg x 1, Benadryl 25 mg x 1, Pepcid 20 mg IV x1, EpiPen x1 then started on IV decadron 6 mg q6h.  After tongue swelling improved, pt was started on steroid taper. -Difficulty tolerating oral Decadron -NGT placed 2/23 Plan: Continue Decadron IV 6 mg daily ARB/ACE inhibitors added to allergy list Consider transition to oral Decadron via NGT   Tongue bleeding --became more profuse early morning on 2/20.  From sloughing and possible trauma following angioedema. --ENT performed bedside debridement and suctioning  Plan: Peridex mouth rinses Soft diet as tolerated Appreciate follow-up from ENT Outpatient follow-up 1 week postdischarge   Dysphagia --due to tongue swelling and poor lingual control of boluses and coordination, slowly improving.  Oral intake remains poor -NGT placed 2/23 Plan: RD following for tube feeds   Essential hypertension Poor control over interval --home ARB telmisartan d/c'ed Plan: --cont clonidine patch 0.2 mg -- Continue home Imdur -- Metoprolol discontinued --IV hydralazine for now   ESRD on peritoneal dialysis (end stage renal disease) ([place]) Currently on peritoneal dialysis.  Vascular surgery was attempting PermCath placement.  This was delayed due to tongue swelling -Patient now more agreeable to SNF placement -Vascular reengaged for catheter placement -Catheter placed 2/24 Plan: Nephrology follow-up for inpatient HD needs   Facial cellulitis, ruled out Sepsis, ruled out Facial cellulitis felt unlikely - DC'ed antibiotics   Anemia of CKD --Mircera as outpatient   Afib, isolated episode --noted early morning 2/15, new dx, likely related to acute illness --started on heparin gtt, since d/c'ed --currently rate controlled and back to NSR  Hypokalemia --monitor and replete PRN   DM2, well controlled Hyperglycemia exacerbated by steroid use --A1c 5.7 -- Discontinued glargine due to hypoglycemia --  Moderate SSI   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  --nonemergent contrast enhanced brain MRI for further evaluation as outpatient   DVT prophylaxis: SQ Lovenox Code Status: Partial Family Communication: Son via phone on 2/23 Disposition Plan: Status is: Inpatient Remains inpatient appropriate because: Tongue bleeding.  Poor p.o. intake and dysphagia secondary to angioedema.  NGT in place.  Would anticipate 3 to 4 days prior to disposition.   Level of care: Telemetry Medical  Consultants:  Nephrology Vascular surgery ENT  Procedures:  None  Antimicrobials: None   Subjective: \Examined.  NGT in place.  More sleepy this morning.  Having some oozing from Vas-Cath site.  Objective: Vitals:   10/27/2021 0832 10/15/2021 0838 10/09/2021 0845 10/18/2021 0915  BP: (!) 116/34 (!) 141/38 (!) 122/38 (!) 129/41  Pulse: (!) 0 88 81   Resp:  15 15 11   Temp:      TempSrc:      SpO2: 97%  97%   Weight:      Height:        Intake/Output Summary (Last 24 hours) at 10/23/2021 1049 Last data filed at 10/09/2021 0300 Gross per 24 hour  Intake 0 ml  Output 400 ml  Net -400 ml   Filed Weights   10/26/21 0500 10/26/21 2107 10/27/21 0500  Weight: 58.7 kg 57.6 kg 62.4 kg    Examination:  General exam: No acute distress.  Appears fatigued Respiratory system: Lungs clear.  Normal work of breathing.  Room air Cardiovascular system: S1-S2, RRR, no murmurs, no pedal edema Gastrointestinal system: Soft, NT/ND, normal bowel sounds, NGT in place Central nervous system: Alert and oriented. No focal neurological deficits. Extremities: Symmetrical decreased power Skin: No rashes, lesions or ulcers Psychiatry: Judgement and insight appear normal. Mood & affect appropriate.     Data Reviewed: I have personally reviewed following labs and imaging studies  CBC: Recent Labs  Lab 10/24/21 0554 10/22/2021 0449 10/26/21 0511 10/27/21 0437  10/09/2021 0509  WBC 24.7* 22.7* 23.9* 20.9* 21.1*  HGB 7.9* 8.3* 7.6* 7.7* 7.4*  HCT 25.9* 26.0* 24.2* 24.1* 23.7*  MCV 87.2 86.7 87.7 85.8 87.1  PLT 231 259 256 272 478   Basic Metabolic Panel: Recent Labs  Lab 10/24/21 0554 10/24/21 1040 10/23/2021 0449 10/26/21 0511 10/27/21 0437 10/18/2021 0509  NA 133*  --  132* 134* 133* 133*  K 3.7  --  3.8 4.0 3.4* 3.3*  CL 100  --  98 99 99 98  CO2 25  --  23 22 23 22   GLUCOSE 138*  --  341* 198* 291* 222*  BUN 78*  --  76* 88* 84* 84*  CREATININE 5.55*  --  5.30* 5.92* 5.85* 6.15*  CALCIUM 7.7*  --  7.8* 7.6* 7.6* 7.3*  MG 2.0 1.9 1.8 1.9 1.7 1.7  PHOS  --  5.8* 5.2*  --   --   --    GFR: Estimated Creatinine Clearance: 6.1 mL/min (A) (by C-G formula based on SCr of 6.15 mg/dL (H)). Liver Function Tests: No results for input(s): AST, ALT, ALKPHOS, BILITOT, PROT, ALBUMIN in the last 168 hours. No results for input(s): LIPASE, AMYLASE in the last 168 hours. No results for input(s): AMMONIA in the last 168 hours. Coagulation Profile: No results for input(s): INR, PROTIME in the last 168  hours. Cardiac Enzymes: No results for input(s): CKTOTAL, CKMB, CKMBINDEX, TROPONINI in the last 168 hours. BNP (last 3 results) No results for input(s): PROBNP in the last 8760 hours. HbA1C: No results for input(s): HGBA1C in the last 72 hours. CBG: Recent Labs  Lab 10/27/21 1215 10/27/21 1651 10/27/21 2009 10/21/2021 0729 10/15/2021 0901  GLUCAP 196* 137* 175* 233* 244*   Lipid Profile: No results for input(s): CHOL, HDL, LDLCALC, TRIG, CHOLHDL, LDLDIRECT in the last 72 hours. Thyroid Function Tests: No results for input(s): TSH, T4TOTAL, FREET4, T3FREE, THYROIDAB in the last 72 hours. Anemia Panel: No results for input(s): VITAMINB12, FOLATE, FERRITIN, TIBC, IRON, RETICCTPCT in the last 72 hours. Sepsis Labs: No results for input(s): PROCALCITON, LATICACIDVEN in the last 168 hours.  Recent Results (from the past 240 hour(s))  MRSA Next  Gen by PCR, Nasal     Status: None   Collection Time: 10/18/21  1:40 PM   Specimen: Nasal Mucosa; Nasal Swab  Result Value Ref Range Status   MRSA by PCR Next Gen NOT DETECTED NOT DETECTED Final    Comment: (NOTE) The GeneXpert MRSA Assay (FDA approved for NASAL specimens only), is one component of a comprehensive MRSA colonization surveillance program. It is not intended to diagnose MRSA infection nor to guide or monitor treatment for MRSA infections. Test performance is not FDA approved in patients less than 33 years old. Performed at [facility], [address], [place], [place] [address]          Radiology Studies: PERIPHERAL VASCULAR CATHETERIZATION  Result Date: 10/18/2021 See surgical note for result.  DG Naso G Tube Plc W/Fl W/Rad  Result Date: 10/27/2021 CLINICAL DATA:  Poor nutrition EXAM: NASO G TUBE PLACEMENT WITH FL AND WITH RAD FLUOROSCOPY: Fluoroscopy Time:  <0.1 minute Radiation Exposure Index (if provided by the fluoroscopic device): 0.2 mGy Number of Acquired Spot Images: 0 COMPARISON:  None. FINDINGS: 5 French nasogastric tube was inserted through the right nostril and advanced into the stomach past a hiatal hernia without complication. Patient tolerated the procedure well. No immediate complications. IMPRESSION: Successful placement of 14 French nasogastric tube. Electronically Signed   By: Kathreen Devoid M.D.   On: 10/27/2021 11:48        Scheduled Meds:  chlorhexidine  15 mL Mouth/Throat Daily   chlorhexidine  15 mL Mouth/Throat QID   Chlorhexidine Gluconate Cloth  6 each Topical Daily   cloNIDine  0.2 mg Transdermal Weekly   darbepoetin (ARANESP) injection - NON-DIALYSIS  100 mcg Subcutaneous Q Thu-1800   dexamethasone (DECADRON) injection  6 mg Intravenous Q24H   feeding supplement (NEPRO CARB STEADY)  237 mL Oral TID   fentaNYL       free water  80 mL Per Tube Q6H   gentamicin cream  1 application Topical Daily   hydrALAZINE  10 mg  Intravenous Q6H   insulin aspart  0-15 Units Subcutaneous TID WC   insulin aspart  0-5 Units Subcutaneous QHS   isosorbide mononitrate  30 mg Oral Daily   magic mouthwash w/lidocaine  10 mL Oral QID   midazolam       multivitamin  1 tablet Oral QHS   pantoprazole (PROTONIX) IV  40 mg Intravenous Q24H   Continuous Infusions:  clindamycin (CLEOCIN) IV 600 mg (10/21/2021 0536)   dextrose 5 % and 0.9% NaCl 40 mL/hr at 10/27/21 1812   dialysis solution 1.5% low-MG/low-CA Stopped (10/29/2021 1035)   feeding supplement (OSMOLITE 1.2 CAL)  LOS: 10 days      Sidney Ace, MD [facility]   If 7PM-7AM, please contact night-coverage  10/08/2021, 10:49 AM

## 2021-10-28 NOTE — Progress Notes (Addendum)
Inpatient Diabetes Program Recommendations  AACE/ADA: New Consensus Statement on Inpatient Glycemic Control (2015)  Target Ranges:  Prepandial:   less than 140 mg/dL      Peak postprandial:   less than 180 mg/dL (1-2 hours)      Critically ill patients:  140 - 180 mg/dL    Latest Reference Range & Units 10/27/21 08:48 10/27/21 12:15 10/27/21 16:51 10/27/21 20:09  Glucose-Capillary 70 - 99 mg/dL 301 (H) 196 (H) 137 (H) 175 (H)  (H): Data is abnormally high  Latest Reference Range & Units 10/29/2021 07:29  Glucose-Capillary 70 - 99 mg/dL 233 (H)  (H): Data is abnormally high   Home DM Meds: Lantus 5 units QHS    Current Orders: Novolog Moderate Correction Scale/ SSI (0-15 units) TID AC + HS     MD- Note patient getting Decadron 6 mg daily.  Note that Semglee was discontinued--last dose given 8am on 02/22  AM CBGs are elevated  Please consider restarting Semglee at home dose of 5 units daily  Please also consider reducing the Novolog SSI to the 0-9 unit (sensitive) scale    --Will follow patient during hospitalization--  Wyn Quaker RN, MSN, CDE Diabetes Coordinator Inpatient Glycemic Control Team Team Pager: [phone] (8a-5p)

## 2021-10-28 NOTE — Progress Notes (Signed)
OT Cancellation Note  Patient Details Name: Catherine Burke MRN: [id number] DOB: August 30, 1941   Cancelled Treatment:    Reason Eval/Treat Not Completed: Patient at procedure or test/ unavailable. Pt out of the room for procedure. Will re-attempt OT tx at later date/time as pt is available and appropriate.   Ardeth Perfect., MPH, MS, OTR/L ascom [phone] 10/23/2021, 7:38 AM

## 2021-10-29 DIAGNOSIS — T783XXA Angioneurotic edema, initial encounter: Secondary | ICD-10-CM | POA: Diagnosis not present

## 2021-10-29 LAB — BASIC METABOLIC PANEL
Anion gap: 12 (ref 5–15)
BUN: 78 mg/dL — ABNORMAL HIGH (ref 8–23)
CO2: 24 mmol/L (ref 22–32)
Calcium: 7.4 mg/dL — ABNORMAL LOW (ref 8.9–10.3)
Chloride: 98 mmol/L (ref 98–111)
Creatinine, Ser: 5.71 mg/dL — ABNORMAL HIGH (ref 0.44–1.00)
GFR, Estimated: 7 mL/min — ABNORMAL LOW (ref >60)
Glucose, Bld: 221 mg/dL — ABNORMAL HIGH (ref 70–99)
Potassium: 4 mmol/L (ref 3.5–5.1)
Sodium: 134 mmol/L — ABNORMAL LOW (ref 135–145)

## 2021-10-29 LAB — CBC
HCT: 22.5 % — ABNORMAL LOW (ref 36.0–46.0)
Hemoglobin: 7.1 g/dL — ABNORMAL LOW (ref 12.0–15.0)
MCH: 27.5 pg (ref 26.0–34.0)
MCHC: 31.6 g/dL (ref 30.0–36.0)
MCV: 87.2 fL (ref 80.0–100.0)
Platelets: 182 10*3/uL (ref 150–400)
RBC: 2.58 MIL/uL — ABNORMAL LOW (ref 3.87–5.11)
RDW: 18.4 % — ABNORMAL HIGH (ref 11.5–15.5)
WBC: 20.1 10*3/uL — ABNORMAL HIGH (ref 4.0–10.5)
nRBC: 0 % (ref 0.0–0.2)

## 2021-10-29 LAB — GLUCOSE, CAPILLARY
Glucose-Capillary: 161 mg/dL — ABNORMAL HIGH (ref 70–99)
Glucose-Capillary: 228 mg/dL — ABNORMAL HIGH (ref 70–99)
Glucose-Capillary: 233 mg/dL — ABNORMAL HIGH (ref 70–99)
Glucose-Capillary: 236 mg/dL — ABNORMAL HIGH (ref 70–99)
Glucose-Capillary: 244 mg/dL — ABNORMAL HIGH (ref 70–99)
Glucose-Capillary: 300 mg/dL — ABNORMAL HIGH (ref 70–99)

## 2021-10-29 LAB — PHOSPHORUS
Phosphorus: 5.7 mg/dL — ABNORMAL HIGH (ref 2.5–4.6)
Phosphorus: 3.9 mg/dL (ref 2.5–4.6)

## 2021-10-29 LAB — MAGNESIUM
Magnesium: 1.8 mg/dL (ref 1.7–2.4)
Magnesium: 1.7 mg/dL (ref 1.7–2.4)

## 2021-10-29 MED ORDER — HEPARIN SODIUM (PORCINE) 1000 UNIT/ML IJ SOLN
INTRAMUSCULAR | Status: AC
  Filled 2021-10-29: qty 10

## 2021-10-29 MED ORDER — INSULIN ASPART 100 UNIT/ML IJ SOLN
0.0000 [IU] | INTRAMUSCULAR | Status: DC
  Administered 2021-10-29 (×2): 5 [IU] via SUBCUTANEOUS
  Administered 2021-10-29: 3 [IU] via SUBCUTANEOUS
  Administered 2021-10-29: 22:00:00 8 [IU] via SUBCUTANEOUS
  Administered 2021-10-29 (×2): 5 [IU] via SUBCUTANEOUS
  Administered 2021-10-30: 8 [IU] via SUBCUTANEOUS
  Administered 2021-10-30 (×2): 11 [IU] via SUBCUTANEOUS
  Administered 2021-10-30: 05:00:00 3 [IU] via SUBCUTANEOUS
  Administered 2021-10-30: 5 [IU] via SUBCUTANEOUS
  Administered 2021-10-31: 06:00:00 3 [IU] via SUBCUTANEOUS
  Administered 2021-10-31: 8 [IU] via SUBCUTANEOUS
  Administered 2021-10-31: 01:00:00 3 [IU] via SUBCUTANEOUS
  Administered 2021-10-31: 09:00:00 8 [IU] via SUBCUTANEOUS
  Administered 2021-10-31: 18:00:00 2 [IU] via SUBCUTANEOUS
  Administered 2021-10-31 – 2021-11-01 (×3): 5 [IU] via SUBCUTANEOUS
  Administered 2021-11-01: 3 [IU] via SUBCUTANEOUS
  Administered 2021-11-01 (×3): 5 [IU] via SUBCUTANEOUS
  Administered 2021-11-02 (×2): 8 [IU] via SUBCUTANEOUS
  Administered 2021-11-02: 01:00:00 3 [IU] via SUBCUTANEOUS
  Administered 2021-11-02: 5 [IU] via SUBCUTANEOUS
  Administered 2021-11-02: 05:00:00 3 [IU] via SUBCUTANEOUS
  Administered 2021-11-03: 5 [IU] via SUBCUTANEOUS
  Administered 2021-11-03: 8 [IU] via SUBCUTANEOUS
  Administered 2021-11-03: 5 [IU] via SUBCUTANEOUS
  Filled 2021-10-29 (×30): qty 1

## 2021-10-29 NOTE — Progress Notes (Signed)
PROGRESS NOTE    Catherine Burke  DJM:[id number] DOB: [birth year] DOA: 10/15/2021 PCP: Catherine Crouch, MD    Brief Narrative:  81 year old with medical history of end-stage renal disease on peritoneal dialysis, hypertension, insulin-dependent diabetes mellitus, who presented emergency department for chief concerns of right facial swelling.   Facial swelling thought to be due to angioedema secondary to ACE inhibitor use.  ACE inhibitor's been discontinued and added to allergy list.   On 2/14, Patient had worsening of her facial and tongue swelling.  Pt transferred to [facility] and PCCM consulted on 2/14 for close monitoring.  Pt was transferred back to the floor after tongue swelling improved.  Hospitalization prolonged due to slow progress back to oral intake and now tongue bleeding.  Angioedema has improved however tongue swelling was an issue.  Seen in consultation by ENT.  No aggressive management recommended.  Tongue swelling has improved but not yet totally resolved.  Facial swelling again has improved but not yet resolved.  Patient having difficulty with p.o. medications and oral intake.  2/23: Patient still not doing well with p.o. intake or oral medications.  Discussed the possibility of image guided NGT placement.  Patient equivocal and request I speak with family.  2/24: Patient and family in agreement.  NG tube placed 2/23.  Tube feeds initiated.  Patient had vascular consulted to place Vas-Cath on 2/24.  Having some oozing from catheter site otherwise stable.  Nephrology following for inpatient HD needs.    Assessment & Plan:   Principal Problem:   Angioedema, initial encounter Active Problems:   Hyperlipidemia   Essential hypertension   GERD (gastroesophageal reflux disease)   Chronic kidney disease (CKD), stage IV (severe) (HCC)   ESRD (end stage renal disease) (HCC)   Facial cellulitis   Malnutrition of moderate degree   Angioedema  * Angioedema --thought to be due  to home telmisartan s/p -Solu-Medrol 125 mg x 1, Benadryl 25 mg x 1, Pepcid 20 mg IV x1, EpiPen x1 then started on IV decadron 6 mg q6h.  After tongue swelling improved, pt was started on steroid taper. -Difficulty tolerating oral Decadron -NGT placed 2/23 Plan: Continue Decadron IV 6 mg daily ARB/ACE inhibitors added to allergy list We will consider transition to oral Decadron within next 24 to 48 hours   Tongue bleeding --became more profuse early morning on 2/20.  From sloughing and possible trauma following angioedema. --ENT performed bedside debridement and suctioning  Plan: Peridex mouth rinses NGT in place.  Patient on tube feeds Appreciate follow-up from ENT Outpatient follow-up 1 week postdischarge   Dysphagia --due to tongue swelling and poor lingual control of boluses and coordination, slowly improving.  Oral intake remains poor -NGT placed 2/23 Plan: RD following for tube feeds   Essential hypertension Poor control over interval --home ARB telmisartan d/c'ed Plan: --cont clonidine patch 0.2 mg -- Continue home Imdur -- Metoprolol discontinued --IV hydralazine for now   ESRD on peritoneal dialysis (end stage renal disease) ([place]) Currently on peritoneal dialysis.  Vascular surgery was attempting PermCath placement.  This was delayed due to tongue swelling -Patient now more agreeable to SNF placement -Vascular reengaged for catheter placement -Catheter placed 2/24 Plan: Nephrology follow-up for inpatient HD needs   Facial cellulitis, ruled out Sepsis, ruled out Facial cellulitis felt unlikely - DC'ed antibiotics   Anemia of CKD --Mircera as outpatient   Afib, isolated episode --noted early morning 2/15, new dx, likely related to acute illness --started on heparin gtt,  since d/c'ed --currently rate controlled and back to NSR   Hypokalemia --monitor and replete PRN   DM2, well controlled Hyperglycemia exacerbated by steroid use --A1c 5.7 --  Discontinued glargine due to hypoglycemia -- Moderate SSI   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  --nonemergent contrast enhanced brain MRI for further evaluation as outpatient   DVT prophylaxis: SQ Lovenox Code Status: Partial Family Communication: Son via phone on 2/23, left VM for son Catherine Burke [phone] on 2/25 Disposition Plan: Status is: Inpatient Remains inpatient appropriate because: Tongue bleeding.  Poor p.o. intake and dysphagia secondary to angioedema.  NGT in place.  Would anticipate 3 to 4 days prior to disposition.   Level of care: Telemetry Medical  Consultants:  Nephrology Vascular surgery ENT  Procedures:  None  Antimicrobials: None   Subjective: Seen and examined.  More awake this morning.  NGT in place.  Tube feeds infusing  Objective: Vitals:   10/29/21 0945 10/29/21 0956 10/29/21 1000 10/29/21 1015  BP: (!) 121/49  (!) 128/38 (!) 138/45  Pulse: 85 82 76 72  Resp: 17 (!) 21 17 16   Temp: 98.4 F (36.9 C)     TempSrc: Axillary     SpO2: 100% 99% 100% 100%  Weight:      Height:        Intake/Output Summary (Last 24 hours) at 10/29/2021 1034 Last data filed at 10/29/2021 0640 Gross per 24 hour  Intake 750 ml  Output -111 ml  Net 861 ml   Filed Weights   10/15/2021 1517 10/29/21 0500 10/29/21 0942  Weight: 59.4 kg 59.9 kg 59.9 kg    Examination:  General exam: No acute distress.  Energy level improved Respiratory system: Lungs clear.  Normal work of breathing.  Room air Cardiovascular system: S1-S2, RRR, no murmurs, no pedal edema Gastrointestinal system: Soft, NT/ND, normal bowel sounds, Dobbhoff in place Central nervous system: Alert and oriented. No focal neurological deficits. Extremities: Symmetrical decreased power Skin: No rashes, lesions or ulcers Psychiatry: Judgement and insight appear normal. Mood & affect appropriate.     Data Reviewed: I have personally  reviewed following labs and imaging studies  CBC: Recent Labs  Lab 11/01/2021 0449 10/26/21 0511 10/27/21 0437 10/10/2021 0509 10/29/21 0503  WBC 22.7* 23.9* 20.9* 21.1* 20.1*  HGB 8.3* 7.6* 7.7* 7.4* 7.1*  HCT 26.0* 24.2* 24.1* 23.7* 22.5*  MCV 86.7 87.7 85.8 87.1 87.2  PLT 259 256 272 242 315   Basic Metabolic Panel: Recent Labs  Lab 10/24/21 1040 10/23/2021 0449 10/26/21 0511 10/27/21 0437 10/27/2021 0509 10/27/2021 1108 10/20/2021 1649 10/29/21 0503  NA  --  132* 134* 133* 133*  --   --  134*  K  --  3.8 4.0 3.4* 3.3*  --   --  4.0  CL  --  98 99 99 98  --   --  98  CO2  --  23 22 23 22   --   --  24  GLUCOSE  --  341* 198* 291* 222*  --   --  221*  BUN  --  76* 88* 84* 84*  --   --  78*  CREATININE  --  5.30* 5.92* 5.85* 6.15*  --   --  5.71*  CALCIUM  --  7.8* 7.6* 7.6* 7.3*  --   --  7.4*  MG 1.9 1.8 1.9 1.7 1.7 1.7 1.7 1.8  PHOS 5.8* 5.2*  --   --   --  6.3* 5.7* 5.7*   GFR: Estimated Creatinine Clearance: 6.6 mL/min (A) (by C-G formula based on SCr of 5.71 mg/dL (H)). Liver Function Tests: No results for input(s): AST, ALT, ALKPHOS, BILITOT, PROT, ALBUMIN in the last 168 hours. No results for input(s): LIPASE, AMYLASE in the last 168 hours. No results for input(s): AMMONIA in the last 168 hours. Coagulation Profile: No results for input(s): INR, PROTIME in the last 168 hours. Cardiac Enzymes: No results for input(s): CKTOTAL, CKMB, CKMBINDEX, TROPONINI in the last 168 hours. BNP (last 3 results) No results for input(s): PROBNP in the last 8760 hours. HbA1C: No results for input(s): HGBA1C in the last 72 hours. CBG: Recent Labs  Lab 10/21/2021 1640 10/15/2021 2059 10/29/21 0034 10/29/21 0549 10/29/21 0811  GLUCAP 109* 222* 233* 228* 244*   Lipid Profile: No results for input(s): CHOL, HDL, LDLCALC, TRIG, CHOLHDL, LDLDIRECT in the last 72 hours. Thyroid Function Tests: No results for input(s): TSH, T4TOTAL, FREET4, T3FREE, THYROIDAB in the last 72 hours. Anemia  Panel: No results for input(s): VITAMINB12, FOLATE, FERRITIN, TIBC, IRON, RETICCTPCT in the last 72 hours. Sepsis Labs: No results for input(s): PROCALCITON, LATICACIDVEN in the last 168 hours.  No results found for this or any previous visit (from the past 240 hour(s)).        Radiology Studies: PERIPHERAL VASCULAR CATHETERIZATION  Result Date: 10/27/2021 See surgical note for result.  DG Naso G Tube Plc W/Fl W/Rad  Result Date: 10/27/2021 CLINICAL DATA:  Poor nutrition EXAM: NASO G TUBE PLACEMENT WITH FL AND WITH RAD FLUOROSCOPY: Fluoroscopy Time:  <0.1 minute Radiation Exposure Index (if provided by the fluoroscopic device): 0.2 mGy Number of Acquired Spot Images: 0 COMPARISON:  None. FINDINGS: 60 French nasogastric tube was inserted through the right nostril and advanced into the stomach past a hiatal hernia without complication. Patient tolerated the procedure well. No immediate complications. IMPRESSION: Successful placement of 14 French nasogastric tube. Electronically Signed   By: Kathreen Devoid M.D.   On: 10/27/2021 11:48        Scheduled Meds:  chlorhexidine  15 mL Mouth/Throat QID   Chlorhexidine Gluconate Cloth  6 each Topical Daily   cloNIDine  0.2 mg Transdermal Weekly   darbepoetin (ARANESP) injection - NON-DIALYSIS  100 mcg Subcutaneous Q Thu-1800   dexamethasone (DECADRON) injection  6 mg Intravenous Q24H   feeding supplement (NEPRO CARB STEADY)  237 mL Oral TID   free water  80 mL Per Tube Q6H   gentamicin cream  1 application Topical Daily   hydrALAZINE  10 mg Intravenous Q6H   insulin aspart  0-15 Units Subcutaneous Q4H   isosorbide mononitrate  30 mg Oral Daily   magic mouthwash w/lidocaine  10 mL Oral QID   multivitamin  1 tablet Oral QHS   pantoprazole (PROTONIX) IV  40 mg Intravenous Q24H   Continuous Infusions:  clindamycin (CLEOCIN) IV 600 mg (10/29/21 4128)   dialysis solution 1.5% low-MG/low-CA Stopped (10/17/2021 1035)   feeding supplement  (OSMOLITE 1.2 CAL) 1,000 mL (10/29/2021 1723)     LOS: 11 days      Sidney Ace, MD [facility]   If 7PM-7AM, please contact night-coverage  10/29/2021, 10:34 AM

## 2021-10-29 NOTE — Progress Notes (Signed)
[facility]  ROUNDING NOTE   Subjective:   [name] was admitted to [facility] on 10/18/2021 for Facial swelling [R22.0] Angioedema, initial encounter [T78.3XXA] Fever, unspecified fever cause [R50.9] Angioedema [T78.3XXA]  Patient is known to our clinic and receives PD management at [facility], supervised by Dr Juleen China.   Patient was seen today on dialysis.   Objective:  Vital signs in last 24 hours:  Temp:  [97.5 F (36.4 C)-98.5 F (36.9 C)] 98.5 F (36.9 C) (02/25 0548) Pulse Rate:  [0-95] 88 (02/25 0548) Resp:  [11-20] 16 (02/25 0548) BP: (116-187)/(34-85) 158/65 (02/25 0548) SpO2:  [97 %-100 %] 99 % (02/25 0548) Weight:  [59.4 kg-59.9 kg] 59.9 kg (02/25 0500)  Weight change:  Filed Weights   10/12/2021 1407 10/31/2021 1517 10/29/21 0500  Weight: 59.4 kg 59.4 kg 59.9 kg    Intake/Output: I/O last 3 completed shifts: In: 750 [NG/GT:700; IV Piggyback:50] Out: 289 [Urine:400]   Intake/Output this shift:  No intake/output data recorded.  Physical Exam: General: NAD  Head: dry oral mucosa   Eyes: Anicteric  Lungs:  Clear to auscultation, normal effort  Heart: Regular rate and rhythm  Abdomen:  Soft, nontender  Extremities:  trace Dependent peripheral edema.  Neurologic: Alert and oriented  Skin: No lesions, generalized ecchymosis   Access: PD catheter, Rt IJ Permcath placed on 10/12/2021 by Dr Lucky Cowboy    Basic Metabolic Panel: Recent Labs  Lab 10/24/21 1040 10/24/2021 0449 10/26/21 0511 10/27/21 0437 10/06/2021 0509 10/12/2021 1108 10/24/2021 1649 10/29/21 0503  NA  --  132* 134* 133* 133*  --   --  134*  K  --  3.8 4.0 3.4* 3.3*  --   --  4.0  CL  --  98 99 99 98  --   --  98  CO2  --  23 22 23 22   --   --  24  GLUCOSE  --  341* 198* 291* 222*  --   --  221*  BUN  --  76* 88* 84* 84*  --   --  78*  CREATININE  --  5.30* 5.92* 5.85* 6.15*  --   --  5.71*  CALCIUM  --  7.8* 7.6* 7.6* 7.3*  --   --  7.4*  MG 1.9 1.8 1.9 1.7 1.7 1.7 1.7 1.8   PHOS 5.8* 5.2*  --   --   --  6.3* 5.7* 5.7*    Liver Function Tests: No results for input(s): AST, ALT, ALKPHOS, BILITOT, PROT, ALBUMIN in the last 168 hours.  No results for input(s): LIPASE, AMYLASE in the last 168 hours. No results for input(s): AMMONIA in the last 168 hours.  CBC: Recent Labs  Lab 10/27/2021 0449 10/26/21 0511 10/27/21 0437 10/29/2021 0509 10/29/21 0503  WBC 22.7* 23.9* 20.9* 21.1* 20.1*  HGB 8.3* 7.6* 7.7* 7.4* 7.1*  HCT 26.0* 24.2* 24.1* 23.7* 22.5*  MCV 86.7 87.7 85.8 87.1 87.2  PLT 259 256 272 242 182    Cardiac Enzymes: No results for input(s): CKTOTAL, CKMB, CKMBINDEX, TROPONINI in the last 168 hours.  BNP: Invalid input(s): POCBNP  CBG: Recent Labs  Lab 10/31/2021 1119 10/05/2021 1640 10/08/2021 2059 10/29/21 0034 10/29/21 0549  GLUCAP 228* 109* 222* 233* 228*    Microbiology: Results for orders placed or performed during the hospital encounter of 10/21/2021  Resp Panel by RT-PCR (Flu A&B, Covid) Nasopharyngeal Swab     Status: None   Collection Time: 10/12/2021  5:18 PM  Specimen: Nasopharyngeal Swab; Nasopharyngeal(NP) swabs in vial transport medium  Result Value Ref Range Status   SARS Coronavirus 2 by RT PCR NEGATIVE NEGATIVE Final    Comment: (NOTE) SARS-CoV-2 target nucleic acids are NOT DETECTED.  The SARS-CoV-2 RNA is generally detectable in upper respiratory specimens during the acute phase of infection. The lowest concentration of SARS-CoV-2 viral copies this assay can detect is 138 copies/mL. A negative result does not preclude SARS-Cov-2 infection and should not be used as the sole basis for treatment or other patient management decisions. A negative result may occur with  improper specimen collection/handling, submission of specimen other than nasopharyngeal swab, presence of viral mutation(s) within the areas targeted by this assay, and inadequate number of viral copies(<138 copies/mL). A negative result must be combined  with clinical observations, patient history, and epidemiological information. The expected result is Negative.  Fact Sheet for Patients:  EntrepreneurPulse.com.au  Fact Sheet for Healthcare Providers:  IncredibleEmployment.be  This test is no t yet approved or cleared by the [place] FDA and  has been authorized for detection and/or diagnosis of SARS-CoV-2 by FDA under an Emergency Use Authorization (EUA). This EUA will remain  in effect (meaning this test can be used) for the duration of the COVID-19 declaration under Section 564(b)(1) of the Act, 21 U.S.C.section 360bbb-3(b)(1), unless the authorization is terminated  or revoked sooner.       Influenza A by PCR NEGATIVE NEGATIVE Final   Influenza B by PCR NEGATIVE NEGATIVE Final    Comment: (NOTE) The Xpert Xpress SARS-CoV-2/FLU/RSV plus assay is intended as an aid in the diagnosis of influenza from Nasopharyngeal swab specimens and should not be used as a sole basis for treatment. Nasal washings and aspirates are unacceptable for Xpert Xpress SARS-CoV-2/FLU/RSV testing.  Fact Sheet for Patients: EntrepreneurPulse.com.au  Fact Sheet for Healthcare Providers: IncredibleEmployment.be  This test is not yet approved or cleared by the [place] FDA and has been authorized for detection and/or diagnosis of SARS-CoV-2 by FDA under an Emergency Use Authorization (EUA). This EUA will remain in effect (meaning this test can be used) for the duration of the COVID-19 declaration under Section 564(b)(1) of the Act, 21 U.S.C. section 360bbb-3(b)(1), unless the authorization is terminated or revoked.  Performed at [facility], [address] [place], [place] [address]   Body fluid culture w Gram Stain     Status: None   Collection Time: 10/13/2021 11:40 PM   Specimen: Peritoneal Washings; Body Fluid  Result Value Ref Range Status   Specimen  Description   Final    PERITONEAL DIALYSIS Performed at [facility], [address], [place], [place] [address]    Special Requests   Final    NONE Performed at [facility], [address] [place], [place] [address]    Gram Stain   Final    NO SQUAMOUS EPITHELIAL CELLS SEEN NO WBC SEEN NO ORGANISMS SEEN    Culture   Final    NO GROWTH 3 DAYS Performed at [facility], [place] [address], [place], [place] [address]    Report Status 10/20/2021 FINAL  Final  MRSA Next Gen by PCR, Nasal     Status: None   Collection Time: 10/18/21  1:40 PM   Specimen: Nasal Mucosa; Nasal Swab  Result Value Ref Range Status   MRSA by PCR Next Gen NOT DETECTED NOT DETECTED Final    Comment: (NOTE) The GeneXpert MRSA Assay (FDA approved for NASAL specimens only),  is one component of a comprehensive MRSA colonization surveillance program. It is not intended to diagnose MRSA infection nor to guide or monitor treatment for MRSA infections. Test performance is not FDA approved in patients less than 44 years old. Performed at [facility], [address] [place], [place] [address]     Coagulation Studies: No results for input(s): LABPROT, INR in the last 72 hours.   Urinalysis: No results for input(s): COLORURINE, LABSPEC, PHURINE, GLUCOSEU, HGBUR, BILIRUBINUR, KETONESUR, PROTEINUR, UROBILINOGEN, NITRITE, LEUKOCYTESUR in the last 72 hours.  Invalid input(s): APPERANCEUR    Imaging: PERIPHERAL VASCULAR CATHETERIZATION  Result Date: 10/12/2021 See surgical note for result.  DG Naso G Tube Plc W/Fl W/Rad  Result Date: 10/27/2021 CLINICAL DATA:  Poor nutrition EXAM: NASO G TUBE PLACEMENT WITH FL AND WITH RAD FLUOROSCOPY: Fluoroscopy Time:  <0.1 minute Radiation Exposure Index (if provided by the fluoroscopic device): 0.2 mGy Number of Acquired Spot Images: 0 COMPARISON:  None. FINDINGS: 60 French nasogastric tube was inserted through the right nostril and  advanced into the stomach past a hiatal hernia without complication. Patient tolerated the procedure well. No immediate complications. IMPRESSION: Successful placement of 14 French nasogastric tube. Electronically Signed   By: Kathreen Devoid M.D.   On: 10/27/2021 11:48     Medications:    clindamycin (CLEOCIN) IV 600 mg (10/29/21 0263)   dialysis solution 1.5% low-MG/low-CA Stopped (10/10/2021 1035)   feeding supplement (OSMOLITE 1.2 CAL) 1,000 mL (10/23/2021 1723)    chlorhexidine  15 mL Mouth/Throat QID   Chlorhexidine Gluconate Cloth  6 each Topical Daily   cloNIDine  0.2 mg Transdermal Weekly   darbepoetin (ARANESP) injection - NON-DIALYSIS  100 mcg Subcutaneous Q Thu-1800   dexamethasone (DECADRON) injection  6 mg Intravenous Q24H   feeding supplement (NEPRO CARB STEADY)  237 mL Oral TID   free water  80 mL Per Tube Q6H   gentamicin cream  1 application Topical Daily   hydrALAZINE  10 mg Intravenous Q6H   insulin aspart  0-15 Units Subcutaneous Q4H   isosorbide mononitrate  30 mg Oral Daily   magic mouthwash w/lidocaine  10 mL Oral QID   multivitamin  1 tablet Oral QHS   pantoprazole (PROTONIX) IV  40 mg Intravenous Q24H   acetaminophen **OR** acetaminophen, benzocaine, hydrALAZINE, HYDROmorphone (DILAUDID) injection, HYDROmorphone (DILAUDID) injection, ondansetron **OR** ondansetron (ZOFRAN) IV, ondansetron (ZOFRAN) IV  Assessment/ Plan:  [name] is a 81 y.o. white female with end stage renal disease on peritoneal dialysis, diabetes mellitus type II insulin dependent, hypertension, and hyperlipidemia who is admitted to [facility] on 10/24/2021 for Facial swelling [R22.0] Angioedema, initial encounter [T78.3XXA] Fever, unspecified fever cause [R50.9] Angioedema [T78.3XXA]  [facility] 59kg CCPD 5 exchanges 2 liter fills 9 hours  End Stage Renal Disease:  Receives nightly PD treatments, but due to plans for rehab placement after discharge.  Appreciate vascular surgery placing HD permcath today for hemodialysis. Attempted to dialyze patient however increased pressures prevented this. This may be due to inflammation from insertion.  Will  re-attempt today.   Dialysis coordinator has confirmed outpatient clinic at [facility] on MWF schedule.  02/24 0701 - 02/25 0700 In: 750 [NG/GT:700; IV Piggyback:50] Out: -111    Hypertension:   ACE inhibitor and ARB initially held due to angioedema.  Patient currently receiving clonidine, hydralazine,  metoprolol and Isosorbide   Blood pressure is not at goal.  HD should help  Anemia of chronic kidney disease:  Mircera as outpatient given 09/27/2021.    Lab Results  Component Value Date   HGB 7.1 (L) 10/29/2021  Aranesp ordered weekly (Thursday) Hgb below target, may transition to IV EPO with treatments  4.  Angioedema-believed due to prescribed ACE-I.   Transferred ourt of ICU on 10/20/21.  Continues receiving dexamethasone  ENT following and recommending oral rinsing  5.  Hypokalemia,  Now better.  6.  Hyponatremia        Sec to ESRD         HD should help     LOS: 11 Julisa Flippo s Bairon Klemann 2/25/20237:15 AM

## 2021-10-29 NOTE — Progress Notes (Signed)
Patient completed 2.5 hr. Hemodialysis treatment. CVC functioned well, without Cath Flo, maintained the prescribed BFR. Patient's B/P low, UF held, therefore, target unmet. Dressing change completed. Patient remainder asymptomatic for low pressure, and ;ow BG. Report given to primary nurse, patient returned to assigned room.

## 2021-10-30 DIAGNOSIS — T783XXA Angioneurotic edema, initial encounter: Secondary | ICD-10-CM | POA: Diagnosis not present

## 2021-10-30 LAB — GLUCOSE, CAPILLARY
Glucose-Capillary: 200 mg/dL — ABNORMAL HIGH (ref 70–99)
Glucose-Capillary: 240 mg/dL — ABNORMAL HIGH (ref 70–99)
Glucose-Capillary: 275 mg/dL — ABNORMAL HIGH (ref 70–99)
Glucose-Capillary: 302 mg/dL — ABNORMAL HIGH (ref 70–99)
Glucose-Capillary: 339 mg/dL — ABNORMAL HIGH (ref 70–99)

## 2021-10-30 LAB — CBC
HCT: 21.3 % — ABNORMAL LOW (ref 36.0–46.0)
Hemoglobin: 6.7 g/dL — ABNORMAL LOW (ref 12.0–15.0)
MCH: 27.9 pg (ref 26.0–34.0)
MCHC: 31.5 g/dL (ref 30.0–36.0)
MCV: 88.8 fL (ref 80.0–100.0)
Platelets: 127 10*3/uL — ABNORMAL LOW (ref 150–400)
RBC: 2.4 MIL/uL — ABNORMAL LOW (ref 3.87–5.11)
RDW: 18.8 % — ABNORMAL HIGH (ref 11.5–15.5)
WBC: 15.1 10*3/uL — ABNORMAL HIGH (ref 4.0–10.5)
nRBC: 0 % (ref 0.0–0.2)

## 2021-10-30 LAB — PREPARE RBC (CROSSMATCH)

## 2021-10-30 MED ORDER — LORAZEPAM 2 MG/ML IJ SOLN
0.5000 mg | Freq: Once | INTRAMUSCULAR | Status: AC
  Administered 2021-10-30: 18:00:00 0.5 mg via INTRAVENOUS
  Filled 2021-10-30: qty 1

## 2021-10-30 MED ORDER — SODIUM CHLORIDE 0.9% IV SOLUTION: Freq: Once | INTRAVENOUS | Status: AC

## 2021-10-30 NOTE — Progress Notes (Signed)
Physical Therapy Treatment Patient Details Name: Catherine Burke MRN: [id number] DOB: [birth year] Today's Date: 10/30/2021   History of Present Illness Pt is an 81 y/o F admitted 10/18/2021 after presenting to the ED with concerns of R facial swelling. Facial swelling thought to be due to angioedema 2/2 ACE inhibitor use. PMH: ESRD on PD, HTN, IDDM, HLD    PT Comments    Patient tolerated session well and was agreeable to treatment. Upon entry into room patient was supine in bed resting. She reported 10/10 pain in her mouth. She was able to demonstrate Min A to complete supine to sit transfer, however was able to complete sit to supine transfer at supervision. Min A was required to complete sit to stand transfer from EOB, and she was able to tolerate 2 bouts of 16 feet from EOB to bedroom door with a standing rest break in-between bouts. Therapeutic exercises at bed level focused on BLE strengthening. Patient is progressing towards her goals, and would continue to benefit from skilled physical therapy in order to optimize patient's return to PLOF. Continue to recommend STR upon discharge from acute hospitalization.   Recommendations for follow up therapy are one component of a multi-disciplinary discharge planning process, led by the attending physician.  Recommendations may be updated based on patient status, additional functional criteria and insurance authorization.  Follow Up Recommendations  Skilled nursing-short term rehab (<3 hours/day)     Assistance Recommended at Discharge Frequent or constant Supervision/Assistance  Patient can return home with the following A little help with walking and/or transfers;A little help with bathing/dressing/bathroom;Help with stairs or ramp for entrance;Assist for transportation;Assistance with cooking/housework   Equipment Recommendations  Rolling walker (2 wheels);BSC/3in1    Recommendations for Other Services       Precautions / Restrictions  Precautions Precautions: Fall Precaution Comments: peritoneal dialysis catheter in abdomen Restrictions Weight Bearing Restrictions: No     Mobility  Bed Mobility Overal bed mobility: Needs Assistance Bed Mobility: Sit to Supine     Supine to sit: HOB elevated, Min guard Sit to supine: Supervision   General bed mobility comments: increase use of UE on bed rail to complete supine to sit transfer    Transfers Overall transfer level: Needs assistance Equipment used: Rolling walker (2 wheels) Transfers: Sit to/from Stand Sit to Stand: Min assist                Ambulation/Gait Ambulation/Gait assistance: Min guard (SBA) Gait Distance (Feet): 32 Feet (2x16 feet) Assistive device: Rolling walker (2 wheels) Gait Pattern/deviations: Decreased step length - right, Decreased step length - left, Decreased stride length Gait velocity: decreased     General Gait Details: patient is generally steady with mobility, poor endurance and weak, standing rest break between bouts of ambulation, no LOB noted   Stairs             Wheelchair Mobility    Modified Rankin (Stroke Patients Only)       Balance Overall balance assessment: Needs assistance Sitting-balance support: Feet supported Sitting balance-Leahy Scale: Good Sitting balance - Comments: supervision static sitting   Standing balance support: Bilateral upper extremity supported, During functional activity, Reliant on assistive device for balance Standing balance-Leahy Scale: Fair Standing balance comment: min guard-SBA                            Cognition Arousal/Alertness: Awake/alert Behavior During Therapy: WFL for tasks assessed/performed Overall Cognitive Status: Within Functional Limits  for tasks assessed                                 General Comments: Sweet lady        Exercises General Exercises - Lower Extremity Ankle Circles/Pumps: Supine, AROM, 20 reps, Both Long  Arc Quad: AROM, Strengthening, Both, 10 reps, Seated Straight Leg Raises: Supine, AROM, 10 reps, Both Hip Flexion/Marching: AROM, Strengthening, Both, 10 reps, Standing    General Comments        Pertinent Vitals/Pain Pain Assessment Pain Assessment: 0-10 Pain Score: 10-Worst pain ever Faces Pain Scale: Hurts a little bit Pain Location: mouth Pain Descriptors / Indicators: Discomfort Pain Intervention(s): Limited activity within patient's tolerance, Monitored during session, Repositioned    Home Living                          Prior Function            PT Goals (current goals can now be found in the care plan section) Acute Rehab PT Goals Patient Stated Goal: patient now wants to go home. Says her son lives with her and he is there all the time. Has ramp to enter PT Goal Formulation: With patient Time For Goal Achievement: 11/05/21 Potential to Achieve Goals: Fair Progress towards PT goals: Progressing toward goals    Frequency    Min 2X/week      PT Plan Current plan remains appropriate    Co-evaluation              AM-PAC PT "6 Clicks" Mobility   Outcome Measure  Help needed turning from your back to your side while in a flat bed without using bedrails?: A Little Help needed moving from lying on your back to sitting on the side of a flat bed without using bedrails?: A Little Help needed moving to and from a bed to a chair (including a wheelchair)?: A Little Help needed standing up from a chair using your arms (e.g., wheelchair or bedside chair)?: A Little Help needed to walk in hospital room?: A Little Help needed climbing 3-5 steps with a railing? : A Lot 6 Click Score: 17    End of Session Equipment Utilized During Treatment: Gait belt Activity Tolerance: Patient limited by fatigue Patient left: in bed;with call bell/phone within reach;with bed alarm set Nurse Communication: Mobility status PT Visit Diagnosis: Muscle weakness  (generalized) (M62.81);Difficulty in walking, not elsewhere classified (R26.2);Unsteadiness on feet (R26.81)     Time: 1610-9604 PT Time Calculation (min) (ACUTE ONLY): 17 min  Charges:  $Therapeutic Activity: 8-22 mins                     Iva Boop, PT  10/30/21. 2:08 PM

## 2021-10-30 NOTE — Progress Notes (Signed)
PROGRESS NOTE    Catherine Burke  ZOX:[id number] DOB: [birth year] DOA: 10/17/2021 PCP: Idelle Crouch, MD    Brief Narrative:  81 year old with medical history of end-stage renal disease on peritoneal dialysis, hypertension, insulin-dependent diabetes mellitus, who presented emergency department for chief concerns of right facial swelling.   Facial swelling thought to be due to angioedema secondary to ACE inhibitor use.  ACE inhibitor's been discontinued and added to allergy list.   On 2/14, Patient had worsening of her facial and tongue swelling.  Pt transferred to [facility] and PCCM consulted on 2/14 for close monitoring.  Pt was transferred back to the floor after tongue swelling improved.  Hospitalization prolonged due to slow progress back to oral intake and now tongue bleeding.  Angioedema has improved however tongue swelling was an issue.  Seen in consultation by ENT.  No aggressive management recommended.  Tongue swelling has improved but not yet totally resolved.  Facial swelling again has improved but not yet resolved.  Patient having difficulty with p.o. medications and oral intake.  2/23: Patient still not doing well with p.o. intake or oral medications.  Discussed the possibility of image guided NGT placement.  Patient equivocal and request I speak with family.  2/24: Patient and family in agreement.  NG tube placed 2/23.  Tube feeds initiated.  Patient had vascular consulted to place Vas-Cath on 2/24.  Having some oozing from catheter site otherwise stable.  Nephrology following for inpatient HD needs.    Assessment & Plan:   Principal Problem:   Angioedema, initial encounter Active Problems:   Hyperlipidemia   Essential hypertension   GERD (gastroesophageal reflux disease)   Chronic kidney disease (CKD), stage IV (severe) (HCC)   ESRD (end stage renal disease) (HCC)   Facial cellulitis   Malnutrition of moderate degree   Angioedema  * Angioedema --thought to be due  to home telmisartan s/p -Solu-Medrol 125 mg x 1, Benadryl 25 mg x 1, Pepcid 20 mg IV x1, EpiPen x1 then started on IV decadron 6 mg q6h.  After tongue swelling improved, pt was started on steroid taper. -Difficulty tolerating oral Decadron -NGT placed 2/23 Plan: Continue Decadron IV 6 mg daily ARB/ACE inhibitors added to allergy list Will request SLP re-eval.  If patient able to tolerate PO meds we may be able to transition to oral Decadron   Tongue bleeding --became more profuse early morning on 2/20.  From sloughing and possible trauma following angioedema. --ENT performed bedside debridement and suctioning  Plan: Peridex mouth rinses NGT in place.  Patient on tube feeds Appreciate follow-up from ENT Outpatient follow-up 1 week postdischarge   Dysphagia --due to tongue swelling and poor lingual control of boluses and coordination, slowly improving.  Oral intake remains poor -NGT placed 2/23 Plan: RD following for tube feeds Reengage SLP for swallow evaluation   Essential hypertension Poor control over interval --home ARB telmisartan d/c'ed Plan: --cont clonidine patch 0.2 mg -- Continue home Imdur -- Metoprolol discontinued --IV hydralazine for now   ESRD on peritoneal dialysis (end stage renal disease) ([place]) Currently on peritoneal dialysis.  Vascular surgery was attempting PermCath placement.  This was delayed due to tongue swelling -Patient now more agreeable to SNF placement -Vascular reengaged for catheter placement -Catheter placed 2/24 Plan: Nephrology follow-up for inpatient HD needs   Facial cellulitis, ruled out Sepsis, ruled out Facial cellulitis felt unlikely - DC'ed antibiotics   Anemia of CKD --Mircera as outpatient   Afib, isolated episode --noted early  morning 2/15, new dx, likely related to acute illness --started on heparin gtt, since d/c'ed --currently rate controlled and back to NSR   Hypokalemia --monitor and replete PRN   DM2, well  controlled Hyperglycemia exacerbated by steroid use --A1c 5.7 -- Discontinued glargine due to hypoglycemia -- Moderate SSI   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  --nonemergent contrast enhanced brain MRI for further evaluation as outpatient   DVT prophylaxis: SQ Lovenox Code Status: Partial Family Communication: Son via phone on 2/23, left VM for son [name] [phone] on 2/25 Disposition Plan: Status is: Inpatient Remains inpatient appropriate because: Tongue bleeding.  Poor p.o. intake and dysphagia secondary to angioedema.  NGT in place.  Would anticipate 3 to 4 days prior to disposition.   Level of care: Telemetry Medical  Consultants:  Nephrology Vascular surgery ENT  Procedures:  None  Antimicrobials: None   Subjective: Seen and examined.  Sleepy this morning but otherwise stable  Objective: Vitals:   10/29/21 2000 10/30/21 0454 10/30/21 0500 10/30/21 0907  BP: (!) 147/69 (!) 141/58  (!) 140/55  Pulse: 83 91  92  Resp: 16 16  18   Temp: 98.2 F (36.8 C)   (!) 97.4 F (36.3 C)  TempSrc: Axillary   Oral  SpO2: 100% 99%  97%  Weight:   60.1 kg   Height:        Intake/Output Summary (Last 24 hours) at 10/30/2021 1014 Last data filed at 10/30/2021 0550 Gross per 24 hour  Intake 904 ml  Output 310 ml  Net 594 ml   Filed Weights   10/29/21 0500 10/29/21 0942 10/30/21 0500  Weight: 59.9 kg 59.9 kg 60.1 kg    Examination:  General exam: No acute distress.  Appears comfortable Respiratory system: Mild coarse breath sounds at bases.  Normal work of breathing.  Room air Cardiovascular system: S1-S2, RRR, no murmurs, no pedal edema Gastrointestinal system: Soft, NT/ND, normal bowel sounds, Dobbhoff in place Central nervous system: Alert and oriented. No focal neurological deficits. Extremities: Symmetrical decreased power Skin: No rashes, lesions or ulcers Psychiatry: Judgement and insight  appear normal. Mood & affect appropriate.     Data Reviewed: I have personally reviewed following labs and imaging studies  CBC: Recent Labs  Lab 10/26/21 0511 10/27/21 0437 10/15/2021 0509 10/29/21 0503 10/30/21 0450  WBC 23.9* 20.9* 21.1* 20.1* 15.1*  HGB 7.6* 7.7* 7.4* 7.1* 6.7*  HCT 24.2* 24.1* 23.7* 22.5* 21.3*  MCV 87.7 85.8 87.1 87.2 88.8  PLT 256 272 242 182 557*   Basic Metabolic Panel: Recent Labs  Lab 10/26/2021 0449 10/26/21 0511 10/27/21 0437 10/19/2021 0509 10/06/2021 1108 10/30/2021 1649 10/29/21 0503 10/29/21 1642  NA 132* 134* 133* 133*  --   --  134*  --   K 3.8 4.0 3.4* 3.3*  --   --  4.0  --   CL 98 99 99 98  --   --  98  --   CO2 23 22 23 22   --   --  24  --   GLUCOSE 341* 198* 291* 222*  --   --  221*  --   BUN 76* 88* 84* 84*  --   --  78*  --   CREATININE 5.30* 5.92* 5.85* 6.15*  --   --  5.71*  --   CALCIUM 7.8* 7.6* 7.6* 7.3*  --   --  7.4*  --   MG 1.8 1.9 1.7 1.7  1.7 1.7 1.8 1.7  PHOS 5.2*  --   --   --  6.3* 5.7* 5.7* 3.9   GFR: Estimated Creatinine Clearance: 6.6 mL/min (A) (by C-G formula based on SCr of 5.71 mg/dL (H)). Liver Function Tests: No results for input(s): AST, ALT, ALKPHOS, BILITOT, PROT, ALBUMIN in the last 168 hours. No results for input(s): LIPASE, AMYLASE in the last 168 hours. No results for input(s): AMMONIA in the last 168 hours. Coagulation Profile: No results for input(s): INR, PROTIME in the last 168 hours. Cardiac Enzymes: No results for input(s): CKTOTAL, CKMB, CKMBINDEX, TROPONINI in the last 168 hours. BNP (last 3 results) No results for input(s): PROBNP in the last 8760 hours. HbA1C: No results for input(s): HGBA1C in the last 72 hours. CBG: Recent Labs  Lab 10/29/21 1539 10/29/21 2101 10/30/21 0020 10/30/21 0455 10/30/21 0908  GLUCAP 236* 300* 240* 200* 275*   Lipid Profile: No results for input(s): CHOL, HDL, LDLCALC, TRIG, CHOLHDL, LDLDIRECT in the last 72 hours. Thyroid Function Tests: No results  for input(s): TSH, T4TOTAL, FREET4, T3FREE, THYROIDAB in the last 72 hours. Anemia Panel: No results for input(s): VITAMINB12, FOLATE, FERRITIN, TIBC, IRON, RETICCTPCT in the last 72 hours. Sepsis Labs: No results for input(s): PROCALCITON, LATICACIDVEN in the last 168 hours.  No results found for this or any previous visit (from the past 240 hour(s)).        Radiology Studies: No results found.      Scheduled Meds:  sodium chloride   Intravenous Once   chlorhexidine  15 mL Mouth/Throat QID   Chlorhexidine Gluconate Cloth  6 each Topical Daily   cloNIDine  0.2 mg Transdermal Weekly   darbepoetin (ARANESP) injection - NON-DIALYSIS  100 mcg Subcutaneous Q Thu-1800   dexamethasone (DECADRON) injection  6 mg Intravenous Q24H   feeding supplement (NEPRO CARB STEADY)  237 mL Oral TID   free water  80 mL Per Tube Q6H   gentamicin cream  1 application Topical Daily   hydrALAZINE  10 mg Intravenous Q6H   insulin aspart  0-15 Units Subcutaneous Q4H   isosorbide mononitrate  30 mg Oral Daily   magic mouthwash w/lidocaine  10 mL Oral QID   multivitamin  1 tablet Oral QHS   pantoprazole (PROTONIX) IV  40 mg Intravenous Q24H   Continuous Infusions:  clindamycin (CLEOCIN) IV Stopped (10/30/21 0550)   dialysis solution 1.5% low-MG/low-CA Stopped (10/15/2021 1035)   feeding supplement (OSMOLITE 1.2 CAL) 1,000 mL (10/30/21 0511)     LOS: 12 days      Sidney Ace, MD [facility]   If 7PM-7AM, please contact night-coverage  10/30/2021, 10:14 AM

## 2021-10-30 NOTE — Progress Notes (Signed)
[facility]  ROUNDING NOTE   Subjective:   [name] was admitted to [facility] on 10/06/2021 for Facial swelling [R22.0] Angioedema, initial encounter [T78.3XXA] Fever, unspecified fever cause [R50.9] Angioedema [T78.3XXA]  Patient is known to our clinic and receives PD management at [facility], supervised by Dr Juleen China.    Patient resting comfortably in the bed. Patient's main concern in today visit was when will she be discharged for rehab.   Objective:  Vital signs in last 24 hours:  Temp:  [97.5 F (36.4 C)-98.4 F (36.9 C)] 98.2 F (36.8 C) (02/25 2000) Pulse Rate:  [72-102] 91 (02/26 0454) Resp:  [16-27] 16 (02/26 0454) BP: (106-159)/(23-69) 141/58 (02/26 0454) SpO2:  [99 %-100 %] 99 % (02/26 0454) Weight:  [59.9 kg-60.1 kg] 60.1 kg (02/26 0500)  Weight change: 0.5 kg Filed Weights   10/29/21 0500 10/29/21 0942 10/30/21 0500  Weight: 59.9 kg 59.9 kg 60.1 kg    Intake/Output: I/O last 3 completed shifts: In: 7622 [NG/GT:1504; IV Piggyback:150] Out: 310 [Other:310]   Intake/Output this shift:  No intake/output data recorded.  Physical Exam: General: NAD  Head: dry oral mucosa   Eyes: Anicteric  Lungs:  Clear to auscultation, normal effort  Heart: Regular rate and rhythm  Abdomen:  Soft, nontender  Extremities:  trace Dependent peripheral edema.  Neurologic: Alert and oriented  Skin: No lesions, generalized ecchymosis   Access: PD catheter, Rt IJ Permcath placed on 10/06/2021 by Dr Lucky Cowboy    Basic Metabolic Panel: Recent Labs  Lab 10/14/2021 0449 10/26/21 0511 10/27/21 0437 10/07/2021 0509 10/18/2021 1108 11/01/2021 1649 10/29/21 0503 10/29/21 1642  NA 132* 134* 133* 133*  --   --  134*  --   K 3.8 4.0 3.4* 3.3*  --   --  4.0  --   CL 98 99 99 98  --   --  98  --   CO2 23 22 23 22   --   --  24  --   GLUCOSE 341* 198* 291* 222*  --   --  221*  --   BUN 76* 88* 84* 84*  --   --  78*  --   CREATININE 5.30* 5.92* 5.85* 6.15*  --   --   5.71*  --   CALCIUM 7.8* 7.6* 7.6* 7.3*  --   --  7.4*  --   MG 1.8 1.9 1.7 1.7 1.7 1.7 1.8 1.7  PHOS 5.2*  --   --   --  6.3* 5.7* 5.7* 3.9    Liver Function Tests: No results for input(s): AST, ALT, ALKPHOS, BILITOT, PROT, ALBUMIN in the last 168 hours.  No results for input(s): LIPASE, AMYLASE in the last 168 hours. No results for input(s): AMMONIA in the last 168 hours.  CBC: Recent Labs  Lab 10/26/21 0511 10/27/21 0437 10/22/2021 0509 10/29/21 0503 10/30/21 0450  WBC 23.9* 20.9* 21.1* 20.1* 15.1*  HGB 7.6* 7.7* 7.4* 7.1* 6.7*  HCT 24.2* 24.1* 23.7* 22.5* 21.3*  MCV 87.7 85.8 87.1 87.2 88.8  PLT 256 272 242 182 127*    Cardiac Enzymes: No results for input(s): CKTOTAL, CKMB, CKMBINDEX, TROPONINI in the last 168 hours.  BNP: Invalid input(s): POCBNP  CBG: Recent Labs  Lab 10/29/21 1312 10/29/21 1539 10/29/21 2101 10/30/21 0020 10/30/21 0455  GLUCAP 161* 236* 300* 240* 200*    Microbiology: Results for orders placed or performed during the hospital encounter of 10/05/2021  Resp Panel by RT-PCR (Flu A&B, Covid) Nasopharyngeal  Swab     Status: None   Collection Time: 10/21/2021  5:18 PM   Specimen: Nasopharyngeal Swab; Nasopharyngeal(NP) swabs in vial transport medium  Result Value Ref Range Status   SARS Coronavirus 2 by RT PCR NEGATIVE NEGATIVE Final    Comment: (NOTE) SARS-CoV-2 target nucleic acids are NOT DETECTED.  The SARS-CoV-2 RNA is generally detectable in upper respiratory specimens during the acute phase of infection. The lowest concentration of SARS-CoV-2 viral copies this assay can detect is 138 copies/mL. A negative result does not preclude SARS-Cov-2 infection and should not be used as the sole basis for treatment or other patient management decisions. A negative result may occur with  improper specimen collection/handling, submission of specimen other than nasopharyngeal swab, presence of viral mutation(s) within the areas targeted by this  assay, and inadequate number of viral copies(<138 copies/mL). A negative result must be combined with clinical observations, patient history, and epidemiological information. The expected result is Negative.  Fact Sheet for Patients:  EntrepreneurPulse.com.au  Fact Sheet for Healthcare Providers:  IncredibleEmployment.be  This test is no t yet approved or cleared by the [place] FDA and  has been authorized for detection and/or diagnosis of SARS-CoV-2 by FDA under an Emergency Use Authorization (EUA). This EUA will remain  in effect (meaning this test can be used) for the duration of the COVID-19 declaration under Section 564(b)(1) of the Act, 21 U.S.C.section 360bbb-3(b)(1), unless the authorization is terminated  or revoked sooner.       Influenza A by PCR NEGATIVE NEGATIVE Final   Influenza B by PCR NEGATIVE NEGATIVE Final    Comment: (NOTE) The Xpert Xpress SARS-CoV-2/FLU/RSV plus assay is intended as an aid in the diagnosis of influenza from Nasopharyngeal swab specimens and should not be used as a sole basis for treatment. Nasal washings and aspirates are unacceptable for Xpert Xpress SARS-CoV-2/FLU/RSV testing.  Fact Sheet for Patients: EntrepreneurPulse.com.au  Fact Sheet for Healthcare Providers: IncredibleEmployment.be  This test is not yet approved or cleared by the [place] FDA and has been authorized for detection and/or diagnosis of SARS-CoV-2 by FDA under an Emergency Use Authorization (EUA). This EUA will remain in effect (meaning this test can be used) for the duration of the COVID-19 declaration under Section 564(b)(1) of the Act, 21 U.S.C. section 360bbb-3(b)(1), unless the authorization is terminated or revoked.  Performed at [facility], [address] [place], [place] [address]   Body fluid culture w Gram Stain     Status: None   Collection Time: 10/17/2021  11:40 PM   Specimen: Peritoneal Washings; Body Fluid  Result Value Ref Range Status   Specimen Description   Final    PERITONEAL DIALYSIS Performed at [facility], [address], [place], [place] [address]    Special Requests   Final    NONE Performed at [facility], [address] [place], [place] [address]    Gram Stain   Final    NO SQUAMOUS EPITHELIAL CELLS SEEN NO WBC SEEN NO ORGANISMS SEEN    Culture   Final    NO GROWTH 3 DAYS Performed at [facility], [address] [place], [place] [address]    Report Status 10/20/2021 FINAL  Final  MRSA Next Gen by PCR, Nasal     Status: None   Collection Time: 10/18/21  1:40 PM   Specimen: Nasal Mucosa; Nasal Swab  Result Value Ref Range Status   MRSA by PCR Next Gen NOT DETECTED NOT  DETECTED Final    Comment: (NOTE) The GeneXpert MRSA Assay (FDA approved for NASAL specimens only), is one component of a comprehensive MRSA colonization surveillance program. It is not intended to diagnose MRSA infection nor to guide or monitor treatment for MRSA infections. Test performance is not FDA approved in patients less than 66 years old. Performed at [facility], [address] [place], [place] [address]     Coagulation Studies: No results for input(s): LABPROT, INR in the last 72 hours.   Urinalysis: No results for input(s): COLORURINE, LABSPEC, PHURINE, GLUCOSEU, HGBUR, BILIRUBINUR, KETONESUR, PROTEINUR, UROBILINOGEN, NITRITE, LEUKOCYTESUR in the last 72 hours.  Invalid input(s): APPERANCEUR    Imaging: No results found.   Medications:    clindamycin (CLEOCIN) IV Stopped (10/30/21 0550)   dialysis solution 1.5% low-MG/low-CA Stopped (10/13/2021 1035)   feeding supplement (OSMOLITE 1.2 CAL) 1,000 mL (10/30/21 0511)    sodium chloride   Intravenous Once   chlorhexidine  15 mL Mouth/Throat QID   Chlorhexidine Gluconate Cloth  6 each Topical Daily   cloNIDine  0.2 mg Transdermal  Weekly   darbepoetin (ARANESP) injection - NON-DIALYSIS  100 mcg Subcutaneous Q Thu-1800   dexamethasone (DECADRON) injection  6 mg Intravenous Q24H   feeding supplement (NEPRO CARB STEADY)  237 mL Oral TID   free water  80 mL Per Tube Q6H   gentamicin cream  1 application Topical Daily   hydrALAZINE  10 mg Intravenous Q6H   insulin aspart  0-15 Units Subcutaneous Q4H   isosorbide mononitrate  30 mg Oral Daily   magic mouthwash w/lidocaine  10 mL Oral QID   multivitamin  1 tablet Oral QHS   pantoprazole (PROTONIX) IV  40 mg Intravenous Q24H   acetaminophen **OR** acetaminophen, benzocaine, hydrALAZINE, HYDROmorphone (DILAUDID) injection, HYDROmorphone (DILAUDID) injection, ondansetron **OR** ondansetron (ZOFRAN) IV, ondansetron (ZOFRAN) IV  Assessment/ Plan:  Catherine Burke is a 81 y.o. white female with end stage renal disease on peritoneal dialysis, diabetes mellitus type II insulin dependent, hypertension, and hyperlipidemia who is admitted to [facility] on 10/22/2021 for Facial swelling [R22.0] Angioedema, initial encounter [T78.3XXA] Fever, unspecified fever cause [R50.9] Angioedema [T78.3XXA]  [facility] 59kg CCPD 5 exchanges 2 liter fills 9 hours  End Stage Renal Disease:  Receives nightly PD treatments, but due to plans for rehab placement after discharge. Appreciate vascular surgery placing HD permcath today for hemodialysis. Attempted to dialyze patient however increased pressures prevented this. This may be due to inflammation from insertion.  Patient was last dialyzed yesterday No need for renal placement therapy today Dialysis coordinator has confirmed outpatient clinic at [facility] on MWF schedule.  02/25 0701 - 02/26 0700 In: 904 [NG/GT:804; IV Piggyback:100] Out: 310    Hypertension:   ACE inhibitor and ARB initially held due to angioedema.  Patient currently receiving clonidine, hydralazine,  metoprolol and Isosorbide   Blood  pressure is not at goal.             HD should help  Anemia of chronic kidney disease:  Mircera as outpatient given 09/27/2021.    Lab Results  Component Value Date   HGB 6.7 (L) 10/30/2021  Aranesp ordered weekly (Thursday) Hgb below target. Patient may need PRBC as hemoglobin is less than 7 Pt may transition to IV EPO with treatments  4.  Angioedema-believed due to prescribed ACE-I.   Transferred ourt of ICU on 10/20/21.  Continues receiving dexamethasone  ENT following and recommending oral rinsing  5.  Hypokalemia,  Now better.  6.  Hyponatremia        Sec to ESRD   Plan Patient is to receive PRBC today No need for renal placement therapy today     LOS: 12 Zafiro Routson s Standley Bargo 2/26/20238:31 AM

## 2021-10-31 DIAGNOSIS — N186 End stage renal disease: Secondary | ICD-10-CM | POA: Diagnosis not present

## 2021-10-31 DIAGNOSIS — T783XXD Angioneurotic edema, subsequent encounter: Secondary | ICD-10-CM

## 2021-10-31 DIAGNOSIS — T783XXA Angioneurotic edema, initial encounter: Secondary | ICD-10-CM | POA: Diagnosis not present

## 2021-10-31 DIAGNOSIS — E440 Moderate protein-calorie malnutrition: Secondary | ICD-10-CM | POA: Diagnosis not present

## 2021-10-31 LAB — GLUCOSE, CAPILLARY
Glucose-Capillary: 148 mg/dL — ABNORMAL HIGH (ref 70–99)
Glucose-Capillary: 160 mg/dL — ABNORMAL HIGH (ref 70–99)
Glucose-Capillary: 190 mg/dL — ABNORMAL HIGH (ref 70–99)
Glucose-Capillary: 219 mg/dL — ABNORMAL HIGH (ref 70–99)
Glucose-Capillary: 260 mg/dL — ABNORMAL HIGH (ref 70–99)
Glucose-Capillary: 277 mg/dL — ABNORMAL HIGH (ref 70–99)

## 2021-10-31 LAB — RENAL FUNCTION PANEL
Albumin: 1.8 g/dL — ABNORMAL LOW (ref 3.5–5.0)
Anion gap: 9 (ref 5–15)
BUN: 64 mg/dL — ABNORMAL HIGH (ref 8–23)
CO2: 26 mmol/L (ref 22–32)
Calcium: 7.7 mg/dL — ABNORMAL LOW (ref 8.9–10.3)
Chloride: 96 mmol/L — ABNORMAL LOW (ref 98–111)
Creatinine, Ser: 4.5 mg/dL — ABNORMAL HIGH (ref 0.44–1.00)
GFR, Estimated: 9 mL/min — ABNORMAL LOW (ref >60)
Glucose, Bld: 209 mg/dL — ABNORMAL HIGH (ref 70–99)
Phosphorus: 4.8 mg/dL — ABNORMAL HIGH (ref 2.5–4.6)
Potassium: 5.1 mmol/L (ref 3.5–5.1)
Sodium: 131 mmol/L — ABNORMAL LOW (ref 135–145)

## 2021-10-31 LAB — BPAM RBC
Blood Product Expiration Date: 202303132359
ISSUE DATE / TIME: 202302261853
Unit Type and Rh: 6200

## 2021-10-31 LAB — TYPE AND SCREEN
ABO/RH(D): A POS
Antibody Screen: NEGATIVE
Unit division: 0

## 2021-10-31 LAB — CBC
HCT: 24.1 % — ABNORMAL LOW (ref 36.0–46.0)
Hemoglobin: 7.8 g/dL — ABNORMAL LOW (ref 12.0–15.0)
MCH: 28.5 pg (ref 26.0–34.0)
MCHC: 32.4 g/dL (ref 30.0–36.0)
MCV: 88 fL (ref 80.0–100.0)
Platelets: 82 10*3/uL — ABNORMAL LOW (ref 150–400)
RBC: 2.74 MIL/uL — ABNORMAL LOW (ref 3.87–5.11)
RDW: 17.5 % — ABNORMAL HIGH (ref 11.5–15.5)
WBC: 12.5 10*3/uL — ABNORMAL HIGH (ref 4.0–10.5)
nRBC: 0 % (ref 0.0–0.2)

## 2021-10-31 LAB — PROTIME-INR
INR: 1 (ref 0.8–1.2)
Prothrombin Time: 13 seconds (ref 11.4–15.2)

## 2021-10-31 MED ORDER — PANTOPRAZOLE 2 MG/ML SUSPENSION
40.0000 mg | Freq: Every day | ORAL | Status: DC
  Administered 2021-10-31 – 2021-11-03 (×4): 40 mg
  Filled 2021-10-31 (×4): qty 20

## 2021-10-31 MED ORDER — INSULIN GLARGINE-YFGN 100 UNIT/ML ~~LOC~~ SOLN
5.0000 [IU] | Freq: Every day | SUBCUTANEOUS | Status: DC
  Administered 2021-10-31 – 2021-11-02 (×3): 5 [IU] via SUBCUTANEOUS
  Filled 2021-10-31 (×4): qty 0.05

## 2021-10-31 MED ORDER — COLLAGENASE 250 UNIT/GM EX OINT
TOPICAL_OINTMENT | Freq: Every day | CUTANEOUS | Status: DC
  Filled 2021-10-31: qty 30

## 2021-10-31 MED ORDER — PANTOPRAZOLE SODIUM 40 MG PO TBEC: 40.0000 mg | DELAYED_RELEASE_TABLET | Freq: Every day | ORAL | Status: DC

## 2021-10-31 NOTE — Consult Note (Signed)
[facility] Nurse Consult Note: Patient receiving care in [facility]. Reason for Consult: sacral wound Wound type: Evolving unstageable areas, total of 3 spots located within a stage 1 PI. Pressure Injury POA: No Measurement: total sacral area measures 6 cm x 4 cm. Wound bed: as described above. The unstageable areas are yellow slough and small Drainage (amount, consistency, odor) no odor Periwound: intact Dressing procedure/placement/frequency: Daily application of santyl to the unstageable yellow wounds, cover site with dry gauze or ABD pad. Twice daily iodine to the right heel and 5th metatarsal head areas of discoloration. Float the heels.  Turn every 2 hours and avoid pressure to the sacrum. Elevate arms on pillows.  Arms are very edematous, L>R. Thank you for the consult.  Discussed plan of care with the patient and bedside nurse.  [facility] nurse will not follow at this time.  Please re-consult the [place] team if needed.  Val Riles, RN, MSN, CWOCN, CNS-BC, pager [phone]

## 2021-10-31 NOTE — Progress Notes (Signed)
PROGRESS NOTE    Catherine Burke  VPX:[id number] DOB: 15-Jul-1941 DOA: 10/11/2021 PCP: Idelle Crouch, MD    Brief Narrative:  81 year old with medical history of end-stage renal disease on peritoneal dialysis, hypertension, insulin-dependent diabetes mellitus, who presented emergency department for chief concerns of right facial swelling.   Facial swelling thought to be due to angioedema secondary to ACE inhibitor use.  ACE inhibitor's been discontinued and added to allergy list.   On 2/14, Patient had worsening of her facial and tongue swelling.  Pt transferred to [facility] and PCCM consulted on 2/14 for close monitoring.  Pt was transferred back to the floor after tongue swelling improved.  Hospitalization prolonged due to slow progress back to oral intake and now tongue bleeding.  Angioedema has improved however tongue swelling was an issue.  Seen in consultation by ENT.  No aggressive management recommended.  Tongue swelling has improved but not yet totally resolved.  Facial swelling again has improved but not yet resolved.  Patient having difficulty with p.o. medications and oral intake.  2/23: Patient still not doing well with p.o. intake or oral medications.  Discussed the possibility of image guided NGT placement.  Patient equivocal and request I speak with family.  2/24: Patient and family in agreement.  NG tube placed 2/23.  Tube feeds initiated.  Patient had vascular consulted to place Vas-Cath on 2/24.  Having some oozing from catheter site otherwise stable.  Nephrology following for inpatient HD needs.  2/27: Patient became upset yesterday.  Wanted NG tube out.  Bedside RN and the Dr. Posey Pronto spoke to patient and family at bedside.    Assessment & Plan:   Principal Problem:   Angioedema, initial encounter Active Problems:   Hyperlipidemia   Essential hypertension   GERD (gastroesophageal reflux disease)   Chronic kidney disease (CKD), stage IV (severe) (HCC)   ESRD (end  stage renal disease) (HCC)   Facial cellulitis   Malnutrition of moderate degree   Angioedema  * Angioedema --thought to be due to home telmisartan s/p -Solu-Medrol 125 mg x 1, Benadryl 25 mg x 1, Pepcid 20 mg IV x1, EpiPen x1 then started on IV decadron 6 mg q6h.  After tongue swelling improved, pt was started on steroid taper. -Difficulty tolerating oral Decadron -NGT placed 2/23 Plan: Continue Decadron IV 6 mg daily ARB/ACE inhibitors added to allergy list SLP reevaluation pending   Tongue bleeding --became more profuse early morning on 2/20.  From sloughing and possible trauma following angioedema. --ENT performed bedside debridement and suctioning  Plan: Peridex mouth rinses NGT in place.  Patient on tube feeds Appreciate follow-up from ENT Outpatient follow-up 1 week postdischarge   Dysphagia --due to tongue swelling and poor lingual control of boluses and coordination, slowly improving.  Oral intake remains poor -NGT placed 2/23 Plan: RD following for tube feeds SLP reevaluation pending   Essential hypertension Poor control over interval --home ARB telmisartan d/c'ed Plan: --cont clonidine patch 0.2 mg -- Continue home Imdur -- Metoprolol discontinued --IV hydralazine for now   ESRD on peritoneal dialysis (end stage renal disease) ([place]) Currently on peritoneal dialysis.  Vascular surgery was attempting PermCath placement.  This was delayed due to tongue swelling -Patient now more agreeable to SNF placement -Vascular reengaged for catheter placement -Catheter placed 2/24 Plan: Nephrology follow-up for inpatient HD needs   Facial cellulitis, ruled out Sepsis, ruled out Facial cellulitis felt unlikely - DC'ed antibiotics   Anemia of CKD --Mircera as outpatient  Afib, isolated episode --noted early morning 2/15, new dx, likely related to acute illness --started on heparin gtt, since d/c'ed --currently rate controlled and back to NSR    Hypokalemia --monitor and replete PRN   DM2, well controlled Hyperglycemia exacerbated by steroid use --A1c 5.7 -- Discontinued glargine due to hypoglycemia -- Moderate SSI   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  --nonemergent contrast enhanced brain MRI for further evaluation as outpatient  Thrombocytopenia Unclear etiology Patient's had a significant decline in her platelet count over last 48 hours Plan: Monitor for now No indication for platelet transfusion Avoid anticoagulants   DVT prophylaxis: SQ Lovenox Code Status: Partial Family Communication: Son via phone on 2/23, left VM for son [name] [phone] on 2/25, daughter via phone 2/26, 2/27 Disposition Plan: Status is: Inpatient Remains inpatient appropriate because: Poor p.o. intake status post angioedema and resultant bleeding.  Disposition plan on hold.   Level of care: Telemetry Medical  Consultants:  Nephrology Vascular surgery ENT  Procedures:  None  Antimicrobials: None   Subjective: Seen and examined.  Sleepy this morning but otherwise stable  Objective: Vitals:   10/31/21 0015 10/31/21 0435 10/31/21 0700 10/31/21 0800  BP: (!) 148/76 (!) 154/68 (!) 120/51   Pulse: 86 82 94   Resp: 16 16 16    Temp: 98 F (36.7 C) 98 F (36.7 C) 98.2 F (36.8 C)   TempSrc: Oral Oral Axillary   SpO2: 99% 98% 99%   Weight:    62.7 kg  Height:        Intake/Output Summary (Last 24 hours) at 10/31/2021 1003 Last data filed at 10/31/2021 0300 Gross per 24 hour  Intake 528.02 ml  Output --  Net 528.02 ml   Filed Weights   10/29/21 0942 10/30/21 0500 10/31/21 0800  Weight: 59.9 kg 60.1 kg 62.7 kg    Examination:  General exam: No acute distress.  Appears frail Respiratory system: Coarse breath sounds.  Normal work of breathing.  Room air Cardiovascular system: S1-S2, RRR, no murmurs, no pedal edema Gastrointestinal system: Soft, NT/ND,  normal bowel sounds, Dobbhoff in place Central nervous system: Alert and oriented. No focal neurological deficits. Extremities: Symmetrical decreased power Skin: No rashes, lesions or ulcers Psychiatry: Judgement and insight appear normal. Mood & affect appropriate.     Data Reviewed: I have personally reviewed following labs and imaging studies  CBC: Recent Labs  Lab 10/27/21 0437 10/06/2021 0509 10/29/21 0503 10/30/21 0450 10/31/21 0358  WBC 20.9* 21.1* 20.1* 15.1* 12.5*  HGB 7.7* 7.4* 7.1* 6.7* 7.8*  HCT 24.1* 23.7* 22.5* 21.3* 24.1*  MCV 85.8 87.1 87.2 88.8 88.0  PLT 272 242 182 127* 82*   Basic Metabolic Panel: Recent Labs  Lab 10/30/2021 0449 10/26/21 0511 10/27/21 0437 10/09/2021 0509 10/27/2021 1108 10/27/2021 1649 10/29/21 0503 10/29/21 1642  NA 132* 134* 133* 133*  --   --  134*  --   K 3.8 4.0 3.4* 3.3*  --   --  4.0  --   CL 98 99 99 98  --   --  98  --   CO2 23 22 23 22   --   --  24  --   GLUCOSE 341* 198* 291* 222*  --   --  221*  --   BUN 76* 88* 84* 84*  --   --  78*  --   CREATININE 5.30* 5.92* 5.85* 6.15*  --   --  5.71*  --  CALCIUM 7.8* 7.6* 7.6* 7.3*  --   --  7.4*  --   MG 1.8 1.9 1.7 1.7 1.7 1.7 1.8 1.7  PHOS 5.2*  --   --   --  6.3* 5.7* 5.7* 3.9   GFR: Estimated Creatinine Clearance: 6.6 mL/min (A) (by C-G formula based on SCr of 5.71 mg/dL (H)). Liver Function Tests: No results for input(s): AST, ALT, ALKPHOS, BILITOT, PROT, ALBUMIN in the last 168 hours. No results for input(s): LIPASE, AMYLASE in the last 168 hours. No results for input(s): AMMONIA in the last 168 hours. Coagulation Profile: Recent Labs  Lab 10/31/21 0745  INR 1.0   Cardiac Enzymes: No results for input(s): CKTOTAL, CKMB, CKMBINDEX, TROPONINI in the last 168 hours. BNP (last 3 results) No results for input(s): PROBNP in the last 8760 hours. HbA1C: No results for input(s): HGBA1C in the last 72 hours. CBG: Recent Labs  Lab 10/30/21 1641 10/30/21 2006 10/31/21 0017  10/31/21 0436 10/31/21 0821  GLUCAP 339* 302* 160* 190* 277*   Lipid Profile: No results for input(s): CHOL, HDL, LDLCALC, TRIG, CHOLHDL, LDLDIRECT in the last 72 hours. Thyroid Function Tests: No results for input(s): TSH, T4TOTAL, FREET4, T3FREE, THYROIDAB in the last 72 hours. Anemia Panel: No results for input(s): VITAMINB12, FOLATE, FERRITIN, TIBC, IRON, RETICCTPCT in the last 72 hours. Sepsis Labs: No results for input(s): PROCALCITON, LATICACIDVEN in the last 168 hours.  No results found for this or any previous visit (from the past 240 hour(s)).        Radiology Studies: No results found.      Scheduled Meds:  chlorhexidine  15 mL Mouth/Throat QID   Chlorhexidine Gluconate Cloth  6 each Topical Daily   cloNIDine  0.2 mg Transdermal Weekly   collagenase   Topical Daily   darbepoetin (ARANESP) injection - NON-DIALYSIS  100 mcg Subcutaneous Q Thu-1800   dexamethasone (DECADRON) injection  6 mg Intravenous Q24H   feeding supplement (NEPRO CARB STEADY)  237 mL Oral TID   free water  80 mL Per Tube Q6H   gentamicin cream  1 application Topical Daily   hydrALAZINE  10 mg Intravenous Q6H   insulin aspart  0-15 Units Subcutaneous Q4H   insulin glargine-yfgn  5 Units Subcutaneous Daily   isosorbide mononitrate  30 mg Oral Daily   magic mouthwash w/lidocaine  10 mL Oral QID   multivitamin  1 tablet Oral QHS   pantoprazole sodium  40 mg Per Tube Daily   Continuous Infusions:  dialysis solution 1.5% low-MG/low-CA Stopped (10/08/2021 1035)   feeding supplement (OSMOLITE 1.2 CAL) 1,000 mL (10/30/21 0511)     LOS: 13 days      Sidney Ace, MD [facility]   If 7PM-7AM, please contact night-coverage  10/31/2021, 10:03 AM

## 2021-10-31 NOTE — Progress Notes (Signed)
Palliative Care Progress Note, Assessment & Plan   Patient Name: Catherine Burke       Date: 10/31/2021 DOB: 04/04/1941  Age: 81 y.o. MRN#: [id number] Attending Physician: Sidney Ace, MD Primary Care Physician: Idelle Crouch, MD Admit Date: 10/10/2021  Reason for Consultation/Follow-up: Establishing goals of care  Subjective: Patient is sitting in the chair with her legs propped up.  She is hunched over and drooling from the mouth.  NG tube is in place and tongue is protruding from mouth.  Patient acknowledges my presence but quickly returns with her head bowed so that the towel in her lap will catch her saliva.  Patient denies pain.  HPI: 81 y.o. female  with a PMH of ESRD on PD, HTN, and IDDM (A1C 5.7) admitted on 10/05/2021 with facial swelling.  Facial swelling thought to be due to angioedema secondary to ACE inhibitor use.    ACE inhibitor's were discontinued and added to allergy list.   Hospitalization prolonged due to slow progress back to oral intake and tongue bleeding.   Patient struggling with PO intake d/t tongue swelling and bleeding. NG tube placed for nutrition.   Patient also with dialysis cath placed 2/24 and is schedule for HD today, 2/27.   Summary of counseling/coordination of care: After reviewing the patient's chart, I assessed the patient met with her at bedside.  She is able to say a few words but mostly uses head-nodding to confirm yes or no or hand gestures.  I shared I am following up for my colleague Ok Edwards in regards to her palliative medicine needs.  Therapeutic silence and active listening provided for patient to share her thoughts and emotions regarding her current health status.    Patient shares she is frustrated.  She made a hand gesture that she would like  to pull the NG tube out.  I asked if she could would she like to have the NG tube removed and she nodded affirmatively.  Reviewed that NG is a temporary form of getting nutrition to her and that should she need it long-term we would need to discuss placement of a PEG/feeding tube.  Patient shook her head definitively "no".  I shared she has not reached that point yet but that all options and outcomes should be offered for her to make the most sound and rational decision for herself.  Discussed importance of maintaining nutritional status while tongue was healing.  Patient denies pain at tongue and says that she is difficult since she does not feel that she can swallow anything.   When asked her thoughts on hemodialysis she nodded her head and when asked if she was okay to continue with it she more definitively nodded her head yes.   Discussed she is in somewhat of a "holding pattern" and that her tongue healing and nutritional status without the NG are navigating how soon she will be able to be discharged.   Patient had no further questions or concerns.  Palliative medicine team will continue to follow the patient throughout her hospitalization.     Code Status: Limited code  Prognosis: Unable to determine  Discharge Planning: To Be Determined  Recommendations/Plan: Treat the treatable Continue  discussions regarding NG vs PEG vs PO intake HD via right chest tunneled catheter  Care plan was discussed with patient  Physical Exam Vitals and nursing note reviewed.  Constitutional:      Appearance: She is ill-appearing.  HENT:     Head: Normocephalic and atraumatic.     Mouth/Throat:     Comments: Poor dentition, sloughing on tongue, erythematous Eyes:     Pupils: Pupils are equal, round, and reactive to light.  Cardiovascular:     Rate and Rhythm: Normal rate.     Pulses: Normal pulses.  Pulmonary:     Effort: Pulmonary effort is normal.  Abdominal:     Palpations: Abdomen is  soft.  Skin:    General: Skin is warm and dry.     Comments: Bilateral UE edema +1 non pitting  Neurological:     Mental Status: She is alert and oriented to person, place, and time.  Psychiatric:        Mood and Affect: Mood normal.        Behavior: Behavior normal.        Thought Content: Thought content normal.        Judgment: Judgment normal.            Palliative Assessment/Data: 50%    Total Time 25 minutes  Greater than 50%  of this time was spent counseling and coordinating care related to the above assessment and plan.  Thank you for allowing the Palliative Medicine Team to assist in the care of this patient.  Moca Ilsa Iha, FNP-BC Palliative Medicine Team Team Phone # [phone]

## 2021-10-31 NOTE — Progress Notes (Signed)
Patient completes 3-hour hemodialysis treatment without incident, right subclavian CVC functions well. VSS throughout treatment. Targeted UF met, with 1-liter fluid removal.   Patient transferred to assigned room, report given to primary nurse.

## 2021-10-31 NOTE — Progress Notes (Signed)
PHARMACIST - PHYSICIAN COMMUNICATION  CONCERNING: IV to Oral Route Change Policy  RECOMMENDATION: This patient is receiving pantoprazole by the intravenous route.  Based on criteria approved by the Pharmacy and Therapeutics Committee, the intravenous medication(s) is/are being converted to the equivalent oral dose form(s).   DESCRIPTION: These criteria include: The patient is eating (either orally or via tube) and/or has been taking other orally administered medications for a least 24 hours The patient has no evidence of active gastrointestinal bleeding or impaired GI absorption (gastrectomy, short bowel, patient on TNA or NPO).  If you have questions about this conversion, please contact the [facility] 10/31/2021 7:36 AM

## 2021-10-31 NOTE — Progress Notes (Signed)
Inpatient Diabetes Program Recommendations  AACE/ADA: New Consensus Statement on Inpatient Glycemic Control   Target Ranges:  Prepandial:   less than 140 mg/dL      Peak postprandial:   less than 180 mg/dL (1-2 hours)      Critically ill patients:  140 - 180 mg/dL    Latest Reference Range & Units 10/31/21 00:17 10/31/21 04:36 10/31/21 08:21  Glucose-Capillary 70 - 99 mg/dL 160 (H) 190 (H) 277 (H)    Latest Reference Range & Units 10/30/21 04:55 10/30/21 09:08 10/30/21 16:41 10/30/21 20:06  Glucose-Capillary 70 - 99 mg/dL 200 (H) 275 (H) 339 (H) 302 (H)   Review of Glycemic Control  Diabetes history: DM2 Outpatient Diabetes medications: Lantus 5 units QHS Current orders for Inpatient glycemic control: Novolog 0-15 units Q4H; Decadron 6 mg Q24H, Osmolite @ 55 ml/hr  Inpatient Diabetes Program Recommendations:    Insulin: If steroids continued as ordered, please consider ordering Semglee 5 units Q24H and Novolog 2 units Q4H for tube feeding coverage.  Thanks, Barnie Alderman, RN, MSN, CDE Diabetes Coordinator Inpatient Diabetes Program [phone] (Team Pager from 8am to 5pm)

## 2021-10-31 NOTE — Progress Notes (Signed)
Occupational Therapy Treatment Patient Details Name: Catherine Burke MRN: [id number] DOB: Aug 26, 1941 Today's Date: 10/31/2021   History of present illness Pt is an 81 y/o F admitted 10/29/2021 after presenting to the ED with concerns of R facial swelling. Facial swelling thought to be due to angioedema 2/2 ACE inhibitor use. PMH: ESRD on PD, HTN, IDDM, HLD   OT comments  Upon entering the room, pt supine in bed and requesting assistance to transfer to recliner. Pt needing min A for bed mobility. Pt reports feeling very weak and feeling unwell. She requests gauze for mouth and therapist called to alert RN. Pt standing with mod lifting assistance and very unsteady min A to take several side steps to recliner chair. RN arrived to place gaze and pt positioned for comfort in chair. Chair alarm activated and all needs within reach.    Recommendations for follow up therapy are one component of a multi-disciplinary discharge planning process, led by the attending physician.  Recommendations may be updated based on patient status, additional functional criteria and insurance authorization.    Follow Up Recommendations  Skilled nursing-short term rehab (<3 hours/day)       Patient can return home with the following  A lot of help with walking and/or transfers;A lot of help with bathing/dressing/bathroom;Assistance with cooking/housework;Direct supervision/assist for medications management;Direct supervision/assist for financial management;Assist for transportation;Help with stairs or ramp for entrance   Equipment Recommendations  Other (comment) (defer to next venue of care)       Precautions / Restrictions Precautions Precautions: Fall Precaution Comments: peritoneal dialysis catheter in abdomen and NG tube       Mobility Bed Mobility Overal bed mobility: Needs Assistance Bed Mobility: Sit to Supine     Supine to sit: Min assist     General bed mobility comments: min A for trunk support and  increased time    Transfers Overall transfer level: Needs assistance Equipment used: 1 person hand held assist Transfers: Sit to/from Stand, Bed to chair/wheelchair/BSC Sit to Stand: Min assist, Mod assist     Step pivot transfers: Min assist, Mod assist     General transfer comment: mod lifting assistance to stand from lower recliner chair surface     Balance Overall balance assessment: Needs assistance Sitting-balance support: Feet supported Sitting balance-Leahy Scale: Fair     Standing balance support: Bilateral upper extremity supported, During functional activity Standing balance-Leahy Scale: Fair                             ADL either performed or assessed with clinical judgement   ADL                                         General ADL Comments: total A to don B socks    Extremity/Trunk Assessment Upper Extremity Assessment Upper Extremity Assessment: Generalized weakness   Lower Extremity Assessment Lower Extremity Assessment: Generalized weakness        Vision Patient Visual Report: No change from baseline            Cognition Arousal/Alertness: Awake/alert Behavior During Therapy: WFL for tasks assessed/performed Overall Cognitive Status: Within Functional Limits for tasks assessed  Pertinent Vitals/ Pain       Pain Assessment Pain Assessment: Faces Faces Pain Scale: Hurts little more Pain Location: generalized Pain Descriptors / Indicators: Discomfort Pain Intervention(s): Limited activity within patient's tolerance, Monitored during session, Repositioned         Frequency  Min 2X/week        Progress Toward Goals  OT Goals(current goals can now be found in the care plan section)  Progress towards OT goals: Progressing toward goals  Acute Rehab OT Goals Patient Stated Goal: to return to bed OT Goal Formulation: With  patient Time For Goal Achievement: 11/05/21 Potential to Achieve Goals: Good  Plan Discharge plan remains appropriate;Frequency remains appropriate       AM-PAC OT "6 Clicks" Daily Activity     Outcome Measure   Help from another person eating meals?: None Help from another person taking care of personal grooming?: A Little Help from another person toileting, which includes using toliet, bedpan, or urinal?: A Lot Help from another person bathing (including washing, rinsing, drying)?: A Lot Help from another person to put on and taking off regular upper body clothing?: A Little Help from another person to put on and taking off regular lower body clothing?: A Lot 6 Click Score: 16    End of Session    OT Visit Diagnosis: Unsteadiness on feet (R26.81);Muscle weakness (generalized) (M62.81)   Activity Tolerance Patient limited by fatigue   Patient Left with call bell/phone within reach;in chair;with chair alarm set   Nurse Communication Mobility status        Time: 3500-9381 OT Time Calculation (min): 17 min  Charges: OT General Charges $OT Visit: 1 Visit OT Treatments $Self Care/Home Management : 8-22 mins  Darleen Crocker, MS, OTR/L , CBIS ascom [phone]  10/31/21, 12:35 PM

## 2021-10-31 NOTE — TOC Progression Note (Signed)
Transition of Care [facility]) - Progression Note    Patient Details  Name: Catherine Burke MRN: [id number] Date of Birth: 03/18/1941  Transition of Care [facility]) CM/SW [place], RN Phone Number: 10/31/2021, 9:53 AM  Clinical Narrative:  Continued recommendations for SNF, [place] still willing to accept patient. Awaiting continued evaluations and discharge timeline.  TOC to follow.    Expected Discharge Plan: [place] Barriers to Discharge: Continued Medical Work up  Expected Discharge Plan and Services Expected Discharge Plan: [place]   Discharge Planning Services: CM Consult   Living arrangements for the past 2 months: Single Family Home                 DME Arranged: N/A DME Agency: NA                   Social Determinants of Health (SDOH) Interventions    Readmission Risk Interventions Readmission Risk Prevention Plan 10/19/2021  Transportation Screening Complete  Medication Review [employer]) Complete  PCP or Specialist appointment within 3-5 days of discharge Complete  HRI or Home Care Consult Complete  SW Recovery Care/Counseling Consult Complete  Palliative Care Screening Not [place] Complete  Some recent data might be hidden

## 2021-10-31 NOTE — Care Management Important Message (Signed)
Important Message  Patient Details  Name: Catherine Burke MRN: [id number] Date of Birth: 12/04/1940   Medicare Important Message Given:  Yes     [name] 10/31/2021, 2:15 PM

## 2021-10-31 NOTE — Progress Notes (Signed)
[facility]  ROUNDING NOTE   Subjective:   [name] was admitted to [facility] on 10/14/2021 for Facial swelling [R22.0] Angioedema, initial encounter [T78.3XXA] Fever, unspecified fever cause [R50.9] Angioedema [T78.3XXA]  Patient is known to our clinic and receives PD management at [facility], supervised by Dr Juleen China.    Patient seen sitting up in chair, appears drowsy remains on tube feedings through NG tube Denies pain or discomfort Denies shortness of breath   Objective:  Vital signs in last 24 hours:  Temp:  [97.5 F (36.4 C)-98.2 F (36.8 C)] 97.5 F (36.4 C) (02/27 1150) Pulse Rate:  [70-95] 80 (02/27 1330) Resp:  [13-21] 18 (02/27 1330) BP: (84-170)/(30-89) 170/89 (02/27 1330) SpO2:  [98 %-100 %] 100 % (02/27 1330) Weight:  [62.7 kg] 62.7 kg (02/27 1203)  Weight change:  Filed Weights   10/30/21 0500 10/31/21 0800 10/31/21 1203  Weight: 60.1 kg 62.7 kg 62.7 kg    Intake/Output: I/O last 3 completed shifts: In: 7517 [Blood:426; NG/GT:804; IV Piggyback:202] Out: -    Intake/Output this shift:  No intake/output data recorded.  Physical Exam: General: NAD  Head: dry oral mucosa   Eyes: Anicteric  Lungs:  Clear to auscultation, normal effort  Heart: Regular rate and rhythm  Abdomen:  Soft, nontender  Extremities:  trace Dependent peripheral edema.  Neurologic: Alert and oriented  Skin: No lesions, generalized ecchymosis   Access: PD catheter, Rt IJ Permcath placed on 10/07/2021 by Dr Lucky Cowboy    Basic Metabolic Panel: Recent Labs  Lab 10/10/2021 0449 10/26/21 0511 10/27/21 0437 10/21/2021 0509 10/31/2021 1108 10/17/2021 1649 10/29/21 0503 10/29/21 1642  NA 132* 134* 133* 133*  --   --  134*  --   K 3.8 4.0 3.4* 3.3*  --   --  4.0  --   CL 98 99 99 98  --   --  98  --   CO2 23 22 23 22   --   --  24  --   GLUCOSE 341* 198* 291* 222*  --   --  221*  --   BUN 76* 88* 84* 84*  --   --  78*  --   CREATININE 5.30* 5.92* 5.85* 6.15*  --    --  5.71*  --   CALCIUM 7.8* 7.6* 7.6* 7.3*  --   --  7.4*  --   MG 1.8 1.9 1.7 1.7 1.7 1.7 1.8 1.7  PHOS 5.2*  --   --   --  6.3* 5.7* 5.7* 3.9     Liver Function Tests: No results for input(s): AST, ALT, ALKPHOS, BILITOT, PROT, ALBUMIN in the last 168 hours.  No results for input(s): LIPASE, AMYLASE in the last 168 hours. No results for input(s): AMMONIA in the last 168 hours.  CBC: Recent Labs  Lab 10/27/21 0437 10/29/2021 0509 10/29/21 0503 10/30/21 0450 10/31/21 0358  WBC 20.9* 21.1* 20.1* 15.1* 12.5*  HGB 7.7* 7.4* 7.1* 6.7* 7.8*  HCT 24.1* 23.7* 22.5* 21.3* 24.1*  MCV 85.8 87.1 87.2 88.8 88.0  PLT 272 242 182 127* 82*     Cardiac Enzymes: No results for input(s): CKTOTAL, CKMB, CKMBINDEX, TROPONINI in the last 168 hours.  BNP: Invalid input(s): POCBNP  CBG: Recent Labs  Lab 10/30/21 2006 10/31/21 0017 10/31/21 0436 10/31/21 0821 10/31/21 1132  GLUCAP 302* 160* 190* 277* 219*     Microbiology: Results for orders placed or performed during the hospital encounter of 10/18/2021  Resp Panel by  RT-PCR (Flu A&B, Covid) Nasopharyngeal Swab     Status: None   Collection Time: 10/31/2021  5:18 PM   Specimen: Nasopharyngeal Swab; Nasopharyngeal(NP) swabs in vial transport medium  Result Value Ref Range Status   SARS Coronavirus 2 by RT PCR NEGATIVE NEGATIVE Final    Comment: (NOTE) SARS-CoV-2 target nucleic acids are NOT DETECTED.  The SARS-CoV-2 RNA is generally detectable in upper respiratory specimens during the acute phase of infection. The lowest concentration of SARS-CoV-2 viral copies this assay can detect is 138 copies/mL. A negative result does not preclude SARS-Cov-2 infection and should not be used as the sole basis for treatment or other patient management decisions. A negative result may occur with  improper specimen collection/handling, submission of specimen other than nasopharyngeal swab, presence of viral mutation(s) within the areas targeted by  this assay, and inadequate number of viral copies(<138 copies/mL). A negative result must be combined with clinical observations, patient history, and epidemiological information. The expected result is Negative.  Fact Sheet for Patients:  EntrepreneurPulse.com.au  Fact Sheet for Healthcare Providers:  IncredibleEmployment.be  This test is no t yet approved or cleared by the [place] FDA and  has been authorized for detection and/or diagnosis of SARS-CoV-2 by FDA under an Emergency Use Authorization (EUA). This EUA will remain  in effect (meaning this test can be used) for the duration of the COVID-19 declaration under Section 564(b)(1) of the Act, 21 U.S.C.section 360bbb-3(b)(1), unless the authorization is terminated  or revoked sooner.       Influenza A by PCR NEGATIVE NEGATIVE Final   Influenza B by PCR NEGATIVE NEGATIVE Final    Comment: (NOTE) The Xpert Xpress SARS-CoV-2/FLU/RSV plus assay is intended as an aid in the diagnosis of influenza from Nasopharyngeal swab specimens and should not be used as a sole basis for treatment. Nasal washings and aspirates are unacceptable for Xpert Xpress SARS-CoV-2/FLU/RSV testing.  Fact Sheet for Patients: EntrepreneurPulse.com.au  Fact Sheet for Healthcare Providers: IncredibleEmployment.be  This test is not yet approved or cleared by the [place] FDA and has been authorized for detection and/or diagnosis of SARS-CoV-2 by FDA under an Emergency Use Authorization (EUA). This EUA will remain in effect (meaning this test can be used) for the duration of the COVID-19 declaration under Section 564(b)(1) of the Act, 21 U.S.C. section 360bbb-3(b)(1), unless the authorization is terminated or revoked.  Performed at [facility], [address] [place], [place] [address]   Body fluid culture w Gram Stain     Status: None   Collection Time:  10/27/2021 11:40 PM   Specimen: Peritoneal Washings; Body Fluid  Result Value Ref Range Status   Specimen Description   Final    PERITONEAL DIALYSIS Performed at [facility], [address], [place], [place] [address]    Special Requests   Final    NONE Performed at [facility], [address] [place], [place] [address]    Gram Stain   Final    NO SQUAMOUS EPITHELIAL CELLS SEEN NO WBC SEEN NO ORGANISMS SEEN    Culture   Final    NO GROWTH 3 DAYS Performed at [facility], [place] [address], [place], [place] [address]    Report Status 10/20/2021 FINAL  Final  MRSA Next Gen by PCR, Nasal     Status: None   Collection Time: 10/18/21  1:40 PM   Specimen: Nasal Mucosa; Nasal Swab  Result Value Ref Range Status   MRSA by PCR  Next Gen NOT DETECTED NOT DETECTED Final    Comment: (NOTE) The GeneXpert MRSA Assay (FDA approved for NASAL specimens only), is one component of a comprehensive MRSA colonization surveillance program. It is not intended to diagnose MRSA infection nor to guide or monitor treatment for MRSA infections. Test performance is not FDA approved in patients less than 30 years old. Performed at [facility], [address] [place], [place] [address]     Coagulation Studies: Recent Labs    10/31/21 0745  LABPROT 13.0  INR 1.0     Urinalysis: No results for input(s): COLORURINE, LABSPEC, PHURINE, GLUCOSEU, HGBUR, BILIRUBINUR, KETONESUR, PROTEINUR, UROBILINOGEN, NITRITE, LEUKOCYTESUR in the last 72 hours.  Invalid input(s): APPERANCEUR    Imaging: No results found.   Medications:    dialysis solution 1.5% low-MG/low-CA Stopped (10/15/2021 1035)   feeding supplement (OSMOLITE 1.2 CAL) 1,000 mL (10/30/21 0511)    chlorhexidine  15 mL Mouth/Throat QID   Chlorhexidine Gluconate Cloth  6 each Topical Daily   cloNIDine  0.2 mg Transdermal Weekly   collagenase   Topical Daily   darbepoetin (ARANESP) injection - NON-DIALYSIS   100 mcg Subcutaneous Q Thu-1800   dexamethasone (DECADRON) injection  6 mg Intravenous Q24H   feeding supplement (NEPRO CARB STEADY)  237 mL Oral TID   free water  80 mL Per Tube Q6H   gentamicin cream  1 application Topical Daily   hydrALAZINE  10 mg Intravenous Q6H   insulin aspart  0-15 Units Subcutaneous Q4H   insulin glargine-yfgn  5 Units Subcutaneous Daily   isosorbide mononitrate  30 mg Oral Daily   magic mouthwash w/lidocaine  10 mL Oral QID   multivitamin  1 tablet Oral QHS   pantoprazole sodium  40 mg Per Tube Daily   acetaminophen **OR** acetaminophen, benzocaine, hydrALAZINE, ondansetron **OR** ondansetron (ZOFRAN) IV  Assessment/ Plan:  [name] is a 81 y.o. white female with end stage renal disease on peritoneal dialysis, diabetes mellitus type II insulin dependent, hypertension, and hyperlipidemia who is admitted to [facility] on 10/17/2021 for Facial swelling [R22.0] Angioedema, initial encounter [T78.3XXA] Fever, unspecified fever cause [R50.9] Angioedema [T78.3XXA]  [facility] 59kg CCPD 5 exchanges 2 liter fills 9 hours  End Stage Renal Disease:  Scheduled to receive dialysis later today. Dialysis coordinator has confirmed outpatient clinic at [facility] on MWF schedule.  02/26 0701 - 02/27 0700 In: 528 [Blood:426; IV Piggyback:102] Out: -    Hypertension:   ACE inhibitor and ARB initially held due to angioedema.  Patient currently receiving clonidine, hydralazine,  metoprolol and Isosorbide   Blood pressure currently 169/82 during dialysis  Anemia of chronic kidney disease:  Mircera as outpatient given 09/27/2021.    Lab Results  Component Value Date   HGB 7.8 (L) 10/31/2021  Aranesp ordered weekly (Thursday) Hgb not at desired target Patient may need PRBC as hemoglobin is less than 7 Pt may transition to IV EPO with treatments  4.  Angioedema-believed due to prescribed ACE-I.   Transferred ourt of ICU on  10/20/21.  Continues receiving dexamethasone  ENT following and recommending oral rinsing     LOS: 13 Khaleel [place] 2/27/20231:51 PM

## 2021-10-31 NOTE — Progress Notes (Signed)
Patient's feeding tube disconnected from continuous feed for transport to dialysis.

## 2021-10-31 NOTE — Progress Notes (Signed)
Patient returned from dialysis in stable condition.

## 2021-10-31 NOTE — Progress Notes (Signed)
PT Cancellation Note  Patient Details Name: KELVIN BURPEE MRN: [id number] DOB: 1941-05-07   Cancelled Treatment:    Reason Eval/Treat Not Completed: Patient at procedure or test/unavailable.  Pt currently off floor at dialysis.  Will re-attempt PT session at a later date/time.  Leitha Bleak, PT 10/31/21, 12:09 PM

## 2021-10-31 NOTE — Progress Notes (Signed)
Speech Language Pathology Treatment: Dysphagia  Patient Details Name: Catherine Burke MRN: [id number] DOB: 12/27/1940 Today's Date: 10/31/2021 Time: 1030-1110 SLP Time Calculation (min) (ACUTE ONLY): 40 min  Assessment / Plan / Recommendation Clinical Impression  Pt seen today for ongoing assessment of readiness to re-initiate po's again, in hopes to establish an oral diet. Pt now has an NGT in place for nutritional support. Family was not present this session. Pt was sitting in chair post OT session. Pt stated she was feeling weak; she seemed fatigued. Speech somewhat intelligible; she appeared to not use much lingual movement during speaking. Noted she leaned forward to drool vs swallowing saliva. Pt stated it was "easier".  Noted slight congested cough at rest prior to tx session; OT endorsed as well during session.  Pt remains NPO d/t increased risk for aspiration from oropharyngeal phase dysphagia in light of lingual/oral angioedema and bleeding last week. Upon initiation of this tx session w/ oral examination, noted Moderate sloughing of tissue along Left lateral side of tongue. Also noted reduced lingual movements for protrusion and lateral sweeping -- suspect this may also be a reason for the reduced saliva management and drooling as well. It was determined that po trials would not be appropriate nor safe w/ the current lingual presentation status. MD and NSG made aware.      This session focused on oral care and education of such to address lingual/oral hygiene and care as well as oral stimulation for lingual movements and swallowing.     Noted pt tended to use minimal lingual movements during single, individual movements and during speech(speech ~50% intelligible). Practiced protrusion and anterior movements during oral care; pt able to achieve lingual protrusion past lips, elevation and lateral movements. She indicated mild discomfort intermittently and discomfort to touch at times. Oral care  practiced w/ pt using swabs addressing both the buccal and gum areas along the Right lateral areas of the tongue -- much improved w/ pink color. Pt used the swabs independently monitoring any area of discomfort -- encouraged her NOT to use use it along the Left lateral side of her tongue. Noted increased lingual movements as well as better control of her saliva. Instructed pt to continue the oral rinse regimen per ENT; and the oral care as tolerates during the day for stimulation/swallowing.   Recommend continue NPO status for diet w/ NGT support for nutrition. Discussed and educated on oral care as often as pt can tolerate during the day in order to provide hygiene and stimulation of lingual/labial movements and swallowing. Discussed monitoring for readiness for oral intake when the larger sloughing of tissue has ceased.  MD consulted on option of Wound Care/ENT assessing for any support of this.  ST services will continue to follow pt's status for readiness for po trials next 1-2 days. NSG and MD updated on pt's progress; Palliative Care and Dietician updated.       HPI HPI: Pt  is a 81 y.o. 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  According to the patient and family since yesterday, patient was admitted to [facility] hospital 2 days ago for pneumoperitoneum suspected to be related to peritoneal dialysis, discharged home and then return to the emergency department at [facility] yesterday for bilateral shoulder pain, mild abdominal pain which was evaluated with no emergent or is new abnormal findings; no pneumonia.  She was  discharged home without any interventions and states the orofacial issues have worsened since yesterday.  Per chart review these issues were not noted or evaluated in the emergency department yesterday.  She did have a  low-grade fever yesterday per family members but no fever today.  No known dental issues that they are aware of.  She has not been eating or drinking well because of the pain in her swollen mouth/facial area and difficulty swallowing.  Not trying any medications for symptoms thus far.  History of similar issues in the past.   CT of Neck: Right lower facial soft tissue swelling, possibly cellulitis. No  drainable fluid collection identified.  2. Moderate hypopharyngeal swelling, new from 66/44/0347 and of  uncertain etiology though possibly infectious or allergic.   CXR: no active disease.      SLP Plan  Continue with current plan of care;Consult other service (comment) (wound care, ENT to address sloughing of tissue on Left lingual area)      Recommendations for follow up therapy are one component of a multi-disciplinary discharge planning process, led by the attending physician.  Recommendations may be updated based on patient status, additional functional criteria and insurance authorization.    Recommendations  Diet recommendations: NPO (NGT ongoing) Medication Administration: Via alternative means (NGT)                General recommendations:  (wound care, ENT) Oral Care Recommendations: Oral care QID;Patient independent with oral care;Staff/trained caregiver to provide oral care (Staff support w/ oral rinses as per ENT) Follow Up Recommendations: Skilled nursing-short term rehab (<3 hours/day) Assistance recommended at discharge: Intermittent Supervision/Assistance SLP Visit Diagnosis: Dysphagia, oropharyngeal phase (R13.12) (angioedema) Plan: Continue with current plan of care;Consult other service (comment) (wound care, ENT to address sloughing of tissue on Left lingual area)             Orinda Kenner, [place], [place] Speech Language Pathologist Rehab Services; [place] [phone] (ascom) Dianey Suchy  10/31/2021, 1:36 PM

## 2021-10-31 NOTE — TOC Progression Note (Signed)
Transition of Care [facility]) - Progression Note    Patient Details  Name: Catherine Burke MRN: [id number] Date of Birth: 04-06-1941  Transition of Care [facility]) CM/SW [place], RN Phone Number: 10/31/2021, 3:47 PM  Clinical Narrative:   Patient is in dialysis today.  SNF continues to be recommendation for patient.  Patient requires continued medical workup.      Expected Discharge Plan: [place] Barriers to Discharge: Continued Medical Work up  Expected Discharge Plan and Services Expected Discharge Plan: [place]   Discharge Planning Services: CM Consult   Living arrangements for the past 2 months: Single Family Home                 DME Arranged: N/A DME Agency: NA                   Social Determinants of Health (SDOH) Interventions    Readmission Risk Interventions Readmission Risk Prevention Plan 10/19/2021  Transportation Screening Complete  Medication Review Press photographer) Complete  PCP or Specialist appointment within 3-5 days of discharge Complete  HRI or Home Care Consult Complete  SW Recovery Care/Counseling Consult Complete  Palliative Care Screening Not [place] Complete  Some recent data might be hidden

## 2021-11-01 DIAGNOSIS — T783XXD Angioneurotic edema, subsequent encounter: Secondary | ICD-10-CM | POA: Diagnosis not present

## 2021-11-01 DIAGNOSIS — D631 Anemia in chronic kidney disease: Secondary | ICD-10-CM

## 2021-11-01 DIAGNOSIS — D696 Thrombocytopenia, unspecified: Secondary | ICD-10-CM

## 2021-11-01 DIAGNOSIS — I4891 Unspecified atrial fibrillation: Secondary | ICD-10-CM

## 2021-11-01 DIAGNOSIS — N186 End stage renal disease: Secondary | ICD-10-CM

## 2021-11-01 DIAGNOSIS — E440 Moderate protein-calorie malnutrition: Secondary | ICD-10-CM

## 2021-11-01 DIAGNOSIS — Z992 Dependence on renal dialysis: Secondary | ICD-10-CM

## 2021-11-01 DIAGNOSIS — N184 Chronic kidney disease, stage 4 (severe): Secondary | ICD-10-CM | POA: Diagnosis not present

## 2021-11-01 DIAGNOSIS — T783XXA Angioneurotic edema, initial encounter: Secondary | ICD-10-CM | POA: Diagnosis not present

## 2021-11-01 LAB — IMMATURE PLATELET FRACTION: Immature Platelet Fraction: 7.1 % (ref 1.2–8.6)

## 2021-11-01 LAB — GLUCOSE, CAPILLARY
Glucose-Capillary: 182 mg/dL — ABNORMAL HIGH (ref 70–99)
Glucose-Capillary: 201 mg/dL — ABNORMAL HIGH (ref 70–99)
Glucose-Capillary: 212 mg/dL — ABNORMAL HIGH (ref 70–99)
Glucose-Capillary: 213 mg/dL — ABNORMAL HIGH (ref 70–99)
Glucose-Capillary: 220 mg/dL — ABNORMAL HIGH (ref 70–99)
Glucose-Capillary: 236 mg/dL — ABNORMAL HIGH (ref 70–99)

## 2021-11-01 LAB — CBC
HCT: 25.2 % — ABNORMAL LOW (ref 36.0–46.0)
Hemoglobin: 8.1 g/dL — ABNORMAL LOW (ref 12.0–15.0)
MCH: 28.6 pg (ref 26.0–34.0)
MCHC: 32.1 g/dL (ref 30.0–36.0)
MCV: 89 fL (ref 80.0–100.0)
Platelets: 69 10*3/uL — ABNORMAL LOW (ref 150–400)
RBC: 2.83 MIL/uL — ABNORMAL LOW (ref 3.87–5.11)
RDW: 18.4 % — ABNORMAL HIGH (ref 11.5–15.5)
WBC: 10.4 10*3/uL (ref 4.0–10.5)
nRBC: 0 % (ref 0.0–0.2)

## 2021-11-01 LAB — FOLATE: Folate: 15.1 ng/mL (ref >5.9)

## 2021-11-01 LAB — RETIC PANEL
Immature Retic Fract: 20.9 % — ABNORMAL HIGH (ref 2.3–15.9)
RBC.: 2.82 MIL/uL — ABNORMAL LOW (ref 3.87–5.11)
Retic Count, Absolute: 90.8 10*3/uL (ref 19.0–186.0)
Retic Ct Pct: 3.2 % — ABNORMAL HIGH (ref 0.4–3.1)
Reticulocyte Hemoglobin: 30.8 pg (ref >27.9)

## 2021-11-01 LAB — VITAMIN B12: Vitamin B-12: 1442 pg/mL — ABNORMAL HIGH (ref 180–914)

## 2021-11-01 LAB — PATHOLOGIST SMEAR REVIEW

## 2021-11-01 LAB — LACTATE DEHYDROGENASE: LDH: 175 U/L (ref 98–192)

## 2021-11-01 MED ORDER — DEXAMETHASONE SODIUM PHOSPHATE 4 MG/ML IJ SOLN: 4.0000 mg | INTRAMUSCULAR | Status: DC

## 2021-11-01 MED ORDER — HYDRALAZINE HCL 50 MG PO TABS
50.0000 mg | ORAL_TABLET | Freq: Three times a day (TID) | ORAL | Status: DC
  Administered 2021-11-01 – 2021-11-02 (×2): 50 mg
  Filled 2021-11-01 (×2): qty 1

## 2021-11-01 MED ORDER — RENA-VITE PO TABS
1.0000 | ORAL_TABLET | Freq: Every day | ORAL | Status: DC
  Administered 2021-11-01 – 2021-11-02 (×2): 1
  Filled 2021-11-01 (×2): qty 1

## 2021-11-01 NOTE — Progress Notes (Signed)
Inpatient Diabetes Program Recommendations  AACE/ADA: New Consensus Statement on Inpatient Glycemic Control   Target Ranges:  Prepandial:   less than 140 mg/dL      Peak postprandial:   less than 180 mg/dL (1-2 hours)      Critically ill patients:  140 - 180 mg/dL    Latest Reference Range & Units 11/01/21 00:12 11/01/21 04:30 11/01/21 07:49  Glucose-Capillary 70 - 99 mg/dL 201 (H) 182 (H) 212 (H)    Latest Reference Range & Units 10/31/21 08:21 10/31/21 11:32 10/31/21 17:43 10/31/21 21:58  Glucose-Capillary 70 - 99 mg/dL 277 (H) 219 (H) 148 (H) 260 (H)   Review of Glycemic Control  Diabetes history: DM2 Outpatient Diabetes medications: Lantus 5 units QHS Current orders for Inpatient glycemic control: Semglee 5 units daily, Novolog 0-15 units Q4H; Decadron 6 mg Q24H, Osmolite @ 55 ml/hr   Inpatient Diabetes Program Recommendations:     Insulin: If steroids continued as ordered, please consider ordering Novolog 3 units Q4H for tube feeding coverage.  Thanks, Barnie Alderman, RN, MSN, CDE Diabetes Coordinator Inpatient Diabetes Program [phone] (Team Pager from 8am to 5pm)

## 2021-11-01 NOTE — Consult Note (Addendum)
Hematology/Oncology Consult note [phone] [phone]      Patient Care Team: Idelle Crouch, MD as PCP - General (Internal Medicine) Dennard Schaumann Cammie Mcgee, MD (Family Medicine) Murlean Iba, MD (Nephrology)   Name of the patient: Catherine Burke  [id number]  02/21/1941   Date of visit: 11/01/21 REASON FOR COSULTATION:  Thrombocytopenia History of presenting illness-  81 y.o. female who has a history of ESRD, peritoneal dialysis, hypertension, diabetes, is currently admitted due to right facial swelling secondary to angioedema due to ACE inhibitor use.  ACE inhibitor dose has been discontinued. Her angioedema was treated with steroid and has improved.  She is on a tapering course of Decadron. Her hospitalization was prolonged due to slow progress to oral intake and some bleeding.  ENT was consulted and performed bedside debridement and suction.  Patient has NG tube placed It was noted that patient has developed thrombocytopenia since 10/30/2021. For her ESRD, patient has had vas-cath on 10/22/2021 and initiated on hemodialysis Hematology was consulted for evaluation of thrombocytopenia.  Patient was seen at bedside.  She denies any pain, shortness of breath currently.  She answers simple questions with hand gestures.  Review of Systems  Constitutional:  Positive for fatigue.  Eyes:  Negative for icterus.  Respiratory:  Negative for cough and shortness of breath.   Cardiovascular:  Positive for leg swelling.  Gastrointestinal:  Negative for abdominal distention, abdominal pain and nausea.  Musculoskeletal:  Negative for neck pain.  Skin:  Negative for itching and rash.  Hematological:  Negative for adenopathy.  Psychiatric/Behavioral:  Negative for confusion.    Allergies  Allergen Reactions   Levofloxacin Other (See Comments)    Throat Swelling    Lisinopril Swelling   Penicillins Anaphylaxis   Telmisartan Swelling    Tongue swelling   Other     Penicillin G Other (See Comments)   Sulfa Antibiotics     Patient Active Problem List   Diagnosis Date Noted   Malnutrition of moderate degree 10/18/2021   Angioedema 10/18/2021   Angioedema, initial encounter 10/17/2021   Facial cellulitis 10/17/2021   Abdominal pain 10/13/2021   PD catheter dysfunction, initial encounter ([place])    Cellulitis 07/20/2021   Abdominal wall cellulitis 07/20/2021   B12 deficiency 04/04/2021   Symptomatic anemia 07/22/2020   Acute on chronic anemia 07/21/2020   ESRD (end stage renal disease) ([place]) 07/21/2020   Peritoneal dialysis status ([place]) 07/21/2020   Chronic kidney disease (CKD), stage IV (severe) ([facility]) 02/08/2018   Proteinuria 10/10/2017   Chronic kidney disease (CKD), stage III (moderate) ([place]) 02/24/2015   Kidney stones    Osteoporosis    GERD (gastroesophageal reflux disease)    Diabetes mellitus ([place]) 01/17/2007   Hyperlipidemia 01/17/2007   Essential hypertension 01/17/2007   OSTEOPENIA 01/17/2007   HYSTERECTOMY, PARTIAL, HX OF 01/17/2007     Past Medical History:  Diagnosis Date   Anemia    Arthritis    B12 deficiency 04/04/2021   Diabetes mellitus    GERD (gastroesophageal reflux disease)    HLD (hyperlipidemia)    Hypercholesterolemia    Hypertension    Kidney stones    Osteoporosis    Proteinuria      Past Surgical History:  Procedure Laterality Date   ABDOMINAL HYSTERECTOMY     BACK SURGERY     lumbar   BREAST CYST ASPIRATION     unsure of laterality    CAPD INSERTION N/A 02/13/2018   Procedure: LAPAROSCOPIC INSERTION CONTINUOUS AMBULATORY PERITONEAL DIALYSIS  (  CAPD) CATHETER;  Surgeon: Algernon Huxley, MD;  Location: [facility];  Service: Vascular;  Laterality: N/A;   CAPD INSERTION N/A 07/21/2021   Procedure: LAPAROSCOPIC INSERTION CONTINUOUS AMBULATORY PERITONEAL DIALYSIS  (CAPD) CATHETER;  Surgeon: Ronny Bacon, MD;  Location: [facility];  Service: General;  Laterality: N/A;   CAPD REMOVAL N/A 07/21/2021    Procedure: REMOVAL CONTINUOUS AMBULATORY PERITONEAL DIALYSIS  (CAPD) CATHETER;  Surgeon: Ronny Bacon, MD;  Location: [facility];  Service: General;  Laterality: N/A;   CHOLECYSTECTOMY     DIALYSIS/PERMA CATHETER INSERTION N/A 10/21/2021   Procedure: DIALYSIS/PERMA CATHETER INSERTION;  Surgeon: Algernon Huxley, MD;  Location: [facility];  Service: Cardiovascular;  Laterality: N/A;   EYE SURGERY     bilateral cataract    NASAL SEPTUM SURGERY      Social History   Socioeconomic History   Marital status: Widowed    Spouse name: Not on file   Number of children: 3   Years of education: Not on file   Highest education level: Not on file  Occupational History   Occupation: retired  Tobacco Use   Smoking status: Never   Smokeless tobacco: Never  Vaping Use   Vaping Use: Never used  Substance and Sexual Activity   Alcohol use: No    Alcohol/week: 0.0 standard drinks   Drug use: No   Sexual activity: Yes  Other Topics Concern   Not on file  Social History Narrative   Not on file   Social Determinants of Health   Financial Resource Strain: Not on file  Food Insecurity: Not on file  Transportation Needs: Not on file  Physical Activity: Not on file  Stress: Not on file  Social Connections: Not on file  Intimate Partner Violence: Not on file     Family History  Problem Relation Age of Onset   Other Mother    Heart attack Father    Diabetes Maternal Grandmother    Breast cancer Paternal Aunt      Current Facility-Administered Medications:    acetaminophen (TYLENOL) tablet 650 mg, 650 mg, Oral, Q6H PRN, 650 mg at 10/27/21 1341 **OR** acetaminophen (TYLENOL) suppository 650 mg, 650 mg, Rectal, Q6H PRN, Dew, Erskine Squibb, MD   benzocaine (ORAJEL) 10 % mucosal gel, , Mouth/Throat, TID PRN, Algernon Huxley, MD, Given at 10/27/21 1342   chlorhexidine (PERIDEX) 0.12 % solution 15 mL, 15 mL, Mouth/Throat, QID, Dew, Erskine Squibb, MD, 15 mL at 11/01/21 1750   Chlorhexidine Gluconate  Cloth 2 % PADS 6 each, 6 each, Topical, Daily, Algernon Huxley, MD, 6 each at 11/01/21 0831   cloNIDine (CATAPRES - Dosed in mg/24 hr) patch 0.2 mg, 0.2 mg, Transdermal, Weekly, Dew, Erskine Squibb, MD, 0.2 mg at 11/01/21 1816   collagenase (SANTYL) ointment, , Topical, Daily, Priscella Mann, Sudheer B, MD, Given at 11/01/21 1301   Darbepoetin Alfa (ARANESP) injection 100 mcg, 100 mcg, Subcutaneous, Q Thu-1800, Dew, Jason S, MD, 100 mcg at 10/27/21 2031   [START ON 11/02/2021] dexamethasone (DECADRON) injection 4 mg, 4 mg, Intravenous, Q24H, Sreenath, Sudheer B, MD   dialysis solution 1.5% low-MG/low-CA dianeal solution, , Intraperitoneal, Q24H, Dew, Erskine Squibb, MD, Held at 10/08/2021 1035   feeding supplement (NEPRO CARB STEADY) liquid 237 mL, 237 mL, Oral, TID, Dew, Erskine Squibb, MD, 237 mL at 10/31/21 2101   feeding supplement (OSMOLITE 1.2 CAL) liquid 1,000 mL, 1,000 mL, Per Tube, Continuous, Sreenath, Sudheer B, MD, Last Rate: 55 mL/hr at 10/30/21 0511, 1,000 mL at  10/30/21 0511   free water 80 mL, 80 mL, Per Tube, Q6H, Sreenath, Sudheer B, MD, 80 mL at 11/01/21 1750   gentamicin cream (GARAMYCIN) 0.1 % 1 application, 1 application, Topical, Daily, Dew, Erskine Squibb, MD, 1 application at 57/01/77 9390   hydrALAZINE (APRESOLINE) injection 10 mg, 10 mg, Intravenous, Q4H PRN, Algernon Huxley, MD, 10 mg at 10/26/21 3009   hydrALAZINE (APRESOLINE) tablet 50 mg, 50 mg, Per Tube, Q8H, Sreenath, Sudheer B, MD   insulin aspart (novoLOG) injection 0-15 Units, 0-15 Units, Subcutaneous, Q4H, Sharion Settler, NP, 5 Units at 11/01/21 1738   insulin glargine-yfgn (SEMGLEE) injection 5 Units, 5 Units, Subcutaneous, Daily, Priscella Mann, Sudheer B, MD, 5 Units at 11/01/21 2330   isosorbide mononitrate (IMDUR) 24 hr tablet 30 mg, 30 mg, Oral, Daily, Dew, Erskine Squibb, MD, 30 mg at 10/30/21 1050   magic mouthwash w/lidocaine, 10 mL, Oral, QID, Dew, Erskine Squibb, MD, 10 mL at 11/01/21 1751   multivitamin (RENA-VIT) tablet 1 tablet, 1 tablet, Per Tube, QHS,  Chappell, Alex B, RPH   ondansetron (ZOFRAN) tablet 4 mg, 4 mg, Oral, Q6H PRN **OR** ondansetron (ZOFRAN) injection 4 mg, 4 mg, Intravenous, Q6H PRN, Lucky Cowboy, Erskine Squibb, MD   pantoprazole sodium (PROTONIX) 40 mg/20 mL oral suspension 40 mg, 40 mg, Per Tube, Daily, Ralene Muskrat B, MD, 40 mg at 11/01/21 0833   Physical exam:  Vitals:   11/01/21 0645 11/01/21 0833 11/01/21 1434 11/01/21 1723  BP: (!) 126/40 (!) 143/67 102/62 123/60  Pulse:  83  84  Resp:  18  17  Temp:  97.9 F (36.6 C)  98.1 F (36.7 C)  TempSrc:  Axillary  Oral  SpO2:  98%  100%  Weight:      Height:       Physical Exam HENT:     Head: Normocephalic.     Nose:     Comments: NG tube in place Eyes:     General: No scleral icterus. Cardiovascular:     Rate and Rhythm: Normal rate.  Pulmonary:     Effort: Pulmonary effort is normal. No respiratory distress.  Abdominal:     General: Abdomen is flat. Bowel sounds are normal.     Palpations: Abdomen is soft.  Musculoskeletal:        General: Swelling present. Normal range of motion.     Cervical back: Normal range of motion.  Skin:    General: Skin is warm.     Coloration: Skin is not pale.     Findings: Bruising present.     Comments: Rt IJ Permcath   Neurological:     Mental Status: She is alert. Mental status is at baseline.        CMP Latest Ref Rng & Units 10/31/2021  Glucose 70 - 99 mg/dL 209(H)  BUN 8 - 23 mg/dL 64(H)  Creatinine 0.44 - 1.00 mg/dL 4.50(H)  Sodium 135 - 145 mmol/L 131(L)  Potassium 3.5 - 5.1 mmol/L 5.1  Chloride 98 - 111 mmol/L 96(L)  CO2 22 - 32 mmol/L 26  Calcium 8.9 - 10.3 mg/dL 7.7(L)  Total Protein 6.5 - 8.1 g/dL -  Total Bilirubin 0.3 - 1.2 mg/dL -  Alkaline Phos 38 - 126 U/L -  AST 15 - 41 U/L -  ALT 0 - 44 U/L -   CBC Latest Ref Rng & Units 11/01/2021  WBC 4.0 - 10.5 K/uL 10.4  Hemoglobin 12.0 - 15.0 g/dL 8.1(L)  Hematocrit 36.0 - 46.0 % 25.2(L)  Platelets 150 - 400 K/uL 69(L)    RADIOGRAPHIC STUDIES: I have  personally reviewed the radiological images as listed and agreed with the findings in the report. CT ABDOMEN PELVIS WO CONTRAST  Result Date: 10/13/2021 CLINICAL DATA:  Epigastric abdominal pain. EXAM: CT ABDOMEN AND PELVIS WITHOUT CONTRAST TECHNIQUE: Multidetector CT imaging of the abdomen and pelvis was performed following the standard protocol without IV contrast. RADIATION DOSE REDUCTION: This exam was performed according to the departmental dose-optimization program which includes automated exposure control, adjustment of the mA and/or kV according to patient size and/or use of iterative reconstruction technique. COMPARISON:  Radiograph of same day. FINDINGS: Lower chest: Small bilateral pleural effusions are noted with minimal adjacent subsegmental atelectasis. Hepatobiliary: Status post cholecystectomy. No biliary dilatation is noted. Slightly nodular hepatic contours are noted suggesting possible hepatic cirrhosis. Pancreas: Unremarkable. No pancreatic ductal dilatation or surrounding inflammatory changes. Spleen: Normal in size without focal abnormality. Adrenals/Urinary Tract: Adrenal glands appear normal. Left renal atrophy is noted. Right renal cysts are noted. No hydronephrosis or renal obstruction is noted. Urinary bladder is unremarkable. 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 Stomach/Bowel: Stomach is within normal limits. Appendix appears normal. No evidence of bowel wall thickening, distention, or inflammatory changes. Vascular/Lymphatic: Aortic atherosclerosis. No enlarged abdominal or pelvic lymph nodes. Reproductive: Status post hysterectomy. No adnexal masses. Other: Peritoneal dialysis catheter is noted with coiled tip seen in the pelvis. Small amount of free fluid is noted in the pelvis. There is also noted moderate pneumoperitoneum in the epigastric region as noted on prior radiograph of same day. This could be related to the presence of the dialysis catheter. Musculoskeletal:  No acute or significant osseous findings. IMPRESSION: Moderate pneumoperitoneum is noted in the epigastric region as noted on radiograph of same day. This most likely is due to the presence of peritoneal dialysis catheter. The possibility of rupture of hollow viscus cannot be excluded, although there are no other signs to suggest bowel perforation on this study. These results were called by telephone at the time of interpretation on 10/13/2021 at 2:24 pm to provider [facility] , who verbally acknowledged these results. 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 Small bilateral pleural effusions with adjacent subsegmental atelectasis. Aortic Atherosclerosis (ICD10-I70.0). Electronically Signed   By: Marijo Conception M.D.   On: 10/13/2021 14:25   DG Chest 2 View  Result Date: 10/13/2021 CLINICAL DATA:  Chest pain beginning yesterday. Shortness of breath. Nausea. EXAM: CHEST - 2 VIEW COMPARISON:  04/25/2021 FINDINGS: Free intraperitoneal air is seen beneath both hemidiaphragms. Mild cardiomegaly stable. Mild scarring is seen in both lung bases. No evidence of acute infiltrate or pulmonary edema. Stable mild thoracic dextroscoliosis. IMPRESSION: Free intraperitoneal air. This may be due to peritoneal dialysis, although perforated viscus cannot be excluded. Recommend clinical correlation, and consider abdomen pelvis CT if warranted. Mild cardiomegaly and bibasilar scarring. No active lung disease. Critical Value/emergent results were called by telephone at the time of interpretation on 10/13/2021 at 12:24 pm to provider [facility] , who verbally acknowledged these results. Electronically Signed   By: Marlaine Hind M.D.   On: 10/13/2021 12:28   CT Soft Tissue Neck Wo Contrast  Result Date: 10/12/2021 CLINICAL DATA:  Right neck/facial swelling, pain, and sore throat. EXAM: CT NECK WITHOUT CONTRAST TECHNIQUE: Multidetector CT imaging of the neck was performed following the standard  protocol without intravenous contrast. RADIATION DOSE REDUCTION: This exam was performed according to the departmental dose-optimization program which  includes automated exposure control, adjustment of the mA and/or kV according to patient size and/or use of iterative reconstruction technique. COMPARISON:  Chest CTA 10/13/2021. FINDINGS: Pharynx and larynx: Moderate low-density soft tissue swelling at the level of the hypopharynx bilaterally effacing the para form sinuses, new from the recent chest CTA. Mild narrowing of the lower oropharyngeal and supraglottic airway. No retropharyngeal fluid collection. No evidence of significant edema involving the epiglottis or floor of mouth. Salivary glands: No inflammation, mass, or stone. Thyroid: Thyroid nodules measuring up to 1.4 cm as seen on the recent chest CTA and for which no follow-up imaging is recommended. Lymph nodes: No enlarged or suspicious lymph nodes in the neck. Vascular: Calcified atherosclerosis at the carotid bifurcations. Limited intracranial: Proximally 2 cm partially calcified mass along the right sphenoid wing, incompletely imaged. Visualized orbits: Bilateral cataract extraction. Mastoids and visualized paranasal sinuses: Mild mucosal thickening/small mucous retention cysts in the left greater than right maxillary sinuses. Trace right mastoid fluid. Skeleton: Moderate cervical disc and facet degeneration. Upper chest: Mild biapical pleuroparenchymal lung scarring. Other: Moderate to prominent right lower facial soft tissue swelling. No drainable fluid collection identified within limitations of noncontrast technique and dental streak artifact. IMPRESSION: 1. Right lower facial soft tissue swelling, possibly cellulitis. No drainable fluid collection identified. 2. Moderate hypopharyngeal swelling, new from 41/74/0814 and of uncertain etiology though possibly infectious or allergic. 3. 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 Consider nonemergent contrast enhanced brain MRI for further evaluation. Electronically Signed   By: Logan Bores M.D.   On: 10/07/2021 17:18   CT Angio Chest Pulmonary Embolism (PE) W or WO Contrast  Result Date: 10/13/2021 CLINICAL DATA:  Generalized chest pain. Shortness of breath. Nausea. EXAM: CT ANGIOGRAPHY CHEST WITH CONTRAST TECHNIQUE: Multidetector CT imaging of the chest was performed using the standard protocol during bolus administration of intravenous contrast. Multiplanar CT image reconstructions and MIPs were obtained to evaluate the vascular anatomy. RADIATION DOSE REDUCTION: This exam was performed according to the departmental dose-optimization program which includes automated exposure control, adjustment of the mA and/or kV according to patient size and/or use of iterative reconstruction technique. CONTRAST:  20mL OMNIPAQUE IOHEXOL 350 MG/ML SOLN COMPARISON:  Chest radiograph and abdominal CT of earlier today. Chest CT of 02/11/2019 FINDINGS: Cardiovascular: The quality of this exam for evaluation of pulmonary embolism is moderate. The bolus is relatively well timed. There is mild motion degradation inferiorly. No pulmonary embolism to the large segmental level. Aortic atherosclerosis. Mild cardiomegaly, without pericardial effusion. Mediastinum/Nodes: Multiple small bilateral thyroid nodules including at up to 1.4 cm. Not clinically significant; no follow-up imaging recommended (ref: J Am Coll Radiol. 2015 Feb;12(2): 143-50) No mediastinal or hilar adenopathy. Small hiatal hernia. Lungs/Pleura: Trace bilateral pleural effusions. Pleural-based right upper and right middle lobe pulmonary nodules of maximally 4 mm are similar and likely subpleural lymph nodes. Mild dependent bibasilar atelectasis. Upper Abdomen: Free intraperitoneal air, as detailed on today's dedicated abdominal CT. Other abdominal findings deferred to that exam. Musculoskeletal: Osteopenia. Review of the  MIP images confirms the above findings. IMPRESSION: 1. No pulmonary embolism with limitations above. 2.  No acute process in the chest. 3. Free intraperitoneal air, as on today's abdominal CT. 4. Small bilateral pleural effusions. 5.  Aortic Atherosclerosis (ICD10-I70.0). 6. Small hiatal hernia. Electronically Signed   By: Abigail Miyamoto M.D.   On: 10/13/2021 20:10   PERIPHERAL VASCULAR CATHETERIZATION  Result Date: 10/27/2021 See surgical note for result.  US Venous Img Upper Uni Right(DVT)  Result Date: 10/22/2021 CLINICAL DATA:  Right arm swelling EXAM: RIGHT UPPER EXTREMITY VENOUS DOPPLER ULTRASOUND TECHNIQUE: Gray-scale sonography with graded compression, as well as color Doppler and duplex ultrasound were performed to evaluate the upper extremity deep venous system from the level of the subclavian vein and including the jugular, axillary, basilic, radial, ulnar and upper cephalic vein. Spectral Doppler was utilized to evaluate flow at rest and with distal augmentation maneuvers. COMPARISON:  None. FINDINGS: Contralateral Subclavian Vein: Respiratory phasicity is normal and symmetric with the symptomatic side. No evidence of thrombus. Normal compressibility. Internal Jugular Vein: No evidence of thrombus. Normal compressibility, respiratory phasicity and response to augmentation. Subclavian Vein: No evidence of thrombus. Normal compressibility, respiratory phasicity and response to augmentation. Axillary Vein: No evidence of thrombus. Normal compressibility, respiratory phasicity and response to augmentation. Cephalic Vein: No evidence of thrombus. Normal compressibility, respiratory phasicity and response to augmentation. Basilic Vein: No evidence of thrombus. Normal compressibility, respiratory phasicity and response to augmentation. Brachial Veins: No evidence of thrombus. Normal compressibility, respiratory phasicity and response to augmentation. Radial Veins: No evidence of thrombus. Normal  compressibility, respiratory phasicity and response to augmentation. Ulnar Veins: No evidence of thrombus. Normal compressibility, respiratory phasicity and response to augmentation. Venous Reflux:  None visualized. Other Findings:  None visualized. IMPRESSION: No evidence of DVT within the right upper extremity. Electronically Signed   By: Inez Catalina M.D.   On: 10/22/2021 03:39   DG CHEST PORT 1 VIEW  Result Date: 10/19/2021 CLINICAL DATA:  New onset atrial fibrillation EXAM: PORTABLE CHEST 1 VIEW COMPARISON:  August 17, 2022 FINDINGS: The heart size and mediastinal contours are stable. Aortic atherosclerosis. Pulmonary vascular congestion. No overt pulmonary edema or focal airspace consolidation. No visible pleural effusion or pneumothorax. The visualized skeletal structures are unchanged. IMPRESSION: Pulmonary vascular congestion without overt pulmonary edema or focal airspace consolidation. Electronically Signed   By: Dahlia Bailiff M.D.   On: 10/19/2021 08:06   DG Chest Port 1 View  Result Date: 10/18/2021 CLINICAL DATA:  Hypoxia. EXAM: PORTABLE CHEST 1 VIEW COMPARISON:  10/27/2021 FINDINGS: Stable cardiomediastinal contours. Aortic atherosclerosis noted. Pulmonary vascular congestion. No pleural effusion or airspace consolidation. IMPRESSION: Pulmonary vascular congestion. Electronically Signed   By: Kerby Moors M.D.   On: 10/18/2021 09:26   DG Chest Port 1 View  Result Date: 10/31/2021 CLINICAL DATA:  Possible sepsis. EXAM: PORTABLE CHEST 1 VIEW COMPARISON:  10/15/2021 FINDINGS: Artifact overlies the chest. Heart size is normal. There is aortic atherosclerosis. The lungs are clear. No heart failure. No acute finding. IMPRESSION: No active disease. Electronically Signed   By: Nelson Chimes M.D.   On: 10/10/2021 16:57   DG Chest Port 1 View  Result Date: 10/15/2021 CLINICAL DATA:  Fever.  Possible sepsis. EXAM: PORTABLE CHEST 1 VIEW COMPARISON:  Chest radiograph dated 10/13/2021. FINDINGS:  Minimal left lung base atelectasis similar or improved since the prior radiograph. Trace left pleural effusion. No new consolidation. No pneumothorax. The cardiac silhouette is within limits. Atherosclerotic calcification of the aorta. Osteopenia with degenerative changes of the spine. No acute osseous pathology. Minimal pneumoperitoneum along the right hemidiaphragm, decreased since the prior study. IMPRESSION: 1. Minimal left lung base atelectasis similar or improved since the prior radiograph. Trace left pleural effusion. 2. Minimal residual pneumoperitoneum under the right hemidiaphragm. Electronically Signed   By: Anner Crete M.D.   On: 10/15/2021 22:18   DG ABD ACUTE 2+V W 1V CHEST  Result Date: 10/14/2021 CLINICAL  DATA:  Abdominal pain EXAM: DG ABDOMEN ACUTE WITH 1 VIEW CHEST COMPARISON:  10/13/2021 CT abdomen/pelvis FINDINGS: Stable cardiomediastinal silhouette with normal heart size. No pneumothorax. No pleural effusion. No overt pulmonary edema. Mild smooth is lateral basilar right pleural thickening, unchanged. Streaky linear mild bibasilar atelectasis is similar. No acute consolidative airspace disease. Free air under the right hemidiaphragm is slightly decreased. Previously visualized free air under the left hemidiaphragm has resolved. Peritoneal dialysis catheter terminates over the deep pelvis to the right of midline. No disproportionately dilated small bowel loops. Moderate gas and mild stool throughout the large bowel, most prominent in the sigmoid colon. Cholecystectomy clips are seen in the right upper quadrant of the abdomen. No radiopaque nephrolithiasis. IMPRESSION: 1. Decreased free air under the right hemidiaphragm, as noted on imaging from 1 day prior, favored to be due to peritoneal dialysis catheter. 2. Nonobstructive bowel gas pattern. 3. Moderate gas and mild stool throughout the large bowel, most prominent in the sigmoid colon. 4. Mild bibasilar atelectasis. No acute  cardiopulmonary disease. Electronically Signed   By: Ilona Sorrel M.D.   On: 10/14/2021 08:33   DG [facility] D[employer] Tube [employer] W/Fl W/Rad  Result Date: 10/27/2021 CLINICAL DATA:  Poor nutrition EXAM: NASO G TUBE PLACEMENT WITH FL AND WITH RAD FLUOROSCOPY: Fluoroscopy Time:  <0.1 minute Radiation Exposure Index (if provided by the fluoroscopic device): 0.2 mGy Number of Acquired Spot Images: 0 COMPARISON:  None. FINDINGS: 63 French nasogastric tube was inserted through the right nostril and advanced into the stomach past a hiatal hernia without complication. Patient tolerated the procedure well. No immediate complications. IMPRESSION: Successful placement of 14 French nasogastric tube. Electronically Signed   By: Kathreen Devoid M.D.   On: 10/27/2021 11:48   ABORTED INVASIVE LAB PROCEDURE  Result Date: 10/30/2021 See surgical note for result.   Assessment and plan-   #Acute thrombocytopenia,  Patient received subcutaneous heparin 2/12-2/14, received heparin bolus/gtt on 2/15-2/16 for new onset of A-fib, switched back to subcutaneous heparin and discontinued on 2/21 4 T score 3 point, low risk. Immature platelet fraction is inappropriately normal, indicating decreased marrow production. Adequate vitamin B12, folate, normal LDH, smear showed no schistocytes. Likely reactive to multiple acute on chronic problems, consumption from procedure and bleeding, hemodialysis Can you monitor.  #Anemia, likely secondary to ESRD. Transfuse PRBC to keep hemoglobin above 7. IV erythropoietin replacement per nephrology.  #Tongue bleeding Stable.  NG tube in place, on tube feeding.  #Angioedema, tapering course of steroids.   Thank you for allowing me to participate in the care of this patient.   Earlie Server, MD, PhD Hematology Oncology  11/01/2021

## 2021-11-01 NOTE — Progress Notes (Signed)
[facility]  ROUNDING NOTE   Subjective:   [name] was admitted to [facility] on 10/05/2021 for Facial swelling [R22.0] Angioedema, initial encounter [T78.3XXA] Fever, unspecified fever cause [R50.9] Angioedema [T78.3XXA]  Patient is known to our clinic and receives PD management at [facility], supervised by Dr Juleen China.    Patient sitting up in bed Alert and oriented Tube feeds remains in place Increased peripheral edema Denies shortness of breath Patient seen later with son at bedside Speech therapy at bedside conducting swallow evaluation.    Objective:  Vital signs in last 24 hours:  Temp:  [97 F (36.1 C)-99.8 F (37.7 C)] 97.9 F (36.6 C) (02/28 0833) Pulse Rate:  [70-95] 83 (02/28 0833) Resp:  [13-21] 18 (02/28 0833) BP: (120-178)/(27-100) 143/67 (02/28 0833) SpO2:  [71 %-100 %] 98 % (02/28 0833) Weight:  [60.7 kg-61.7 kg] 60.7 kg (02/28 0500)  Weight change:  Filed Weights   10/31/21 1203 10/31/21 1615 11/01/21 0500  Weight: 62.7 kg 61.7 kg 60.7 kg    Intake/Output: I/O last 3 completed shifts: In: 476 [Blood:426; IV Piggyback:50] Out: 1011 [Other:1011]   Intake/Output this shift:  No intake/output data recorded.  Physical Exam: General: NAD  Head: dry oral mucosa, left lateral tongue   Eyes: Anicteric  Lungs:  Clear to auscultation, normal effort  Heart: Regular rate and rhythm  Abdomen:  Soft, nontender  Extremities:  2+ Dependent peripheral edema.  Neurologic: Alert and oriented  Skin: No lesions, generalized ecchymosis   Access: PD catheter, Rt IJ Permcath placed on 10/27/2021 by Dr Lucky Cowboy    Basic Metabolic Panel: Recent Labs  Lab 10/26/21 0511 10/27/21 0437 10/10/2021 0509 10/12/2021 1108 10/27/2021 1649 10/29/21 0503 10/29/21 1642 10/31/21 1300  NA 134* 133* 133*  --   --  134*  --  131*  K 4.0 3.4* 3.3*  --   --  4.0  --  5.1  CL 99 99 98  --   --  98  --  96*  CO2 22 23 22   --   --  24  --  26  GLUCOSE 198* 291*  222*  --   --  221*  --  209*  BUN 88* 84* 84*  --   --  78*  --  64*  CREATININE 5.92* 5.85* 6.15*  --   --  5.71*  --  4.50*  CALCIUM 7.6* 7.6* 7.3*  --   --  7.4*  --  7.7*  MG 1.9 1.7 1.7 1.7 1.7 1.8 1.7  --   PHOS  --   --   --  6.3* 5.7* 5.7* 3.9 4.8*     Liver Function Tests: Recent Labs  Lab 10/31/21 1300  ALBUMIN 1.8*    No results for input(s): LIPASE, AMYLASE in the last 168 hours. No results for input(s): AMMONIA in the last 168 hours.  CBC: Recent Labs  Lab 10/20/2021 0509 10/29/21 0503 10/30/21 0450 10/31/21 0358 11/01/21 0455  WBC 21.1* 20.1* 15.1* 12.5* 10.4  HGB 7.4* 7.1* 6.7* 7.8* 8.1*  HCT 23.7* 22.5* 21.3* 24.1* 25.2*  MCV 87.1 87.2 88.8 88.0 89.0  PLT 242 182 127* 82* 69*     Cardiac Enzymes: No results for input(s): CKTOTAL, CKMB, CKMBINDEX, TROPONINI in the last 168 hours.  BNP: Invalid input(s): POCBNP  CBG: Recent Labs  Lab 10/31/21 2158 11/01/21 0012 11/01/21 0430 11/01/21 0749 11/01/21 1223  GLUCAP 260* 201* 182* 212* 213*     Microbiology: Results for orders  placed or performed during the hospital encounter of 10/23/2021  Resp Panel by RT-PCR (Flu A&B, Covid) Nasopharyngeal Swab     Status: None   Collection Time: 10/17/2021  5:18 PM   Specimen: Nasopharyngeal Swab; Nasopharyngeal(NP) swabs in vial transport medium  Result Value Ref Range Status   SARS Coronavirus 2 by RT PCR NEGATIVE NEGATIVE Final    Comment: (NOTE) SARS-CoV-2 target nucleic acids are NOT DETECTED.  The SARS-CoV-2 RNA is generally detectable in upper respiratory specimens during the acute phase of infection. The lowest concentration of SARS-CoV-2 viral copies this assay can detect is 138 copies/mL. A negative result does not preclude SARS-Cov-2 infection and should not be used as the sole basis for treatment or other patient management decisions. A negative result may occur with  improper specimen collection/handling, submission of specimen other than  nasopharyngeal swab, presence of viral mutation(s) within the areas targeted by this assay, and inadequate number of viral copies(<138 copies/mL). A negative result must be combined with clinical observations, patient history, and epidemiological information. The expected result is Negative.  Fact Sheet for Patients:  EntrepreneurPulse.com.au  Fact Sheet for Healthcare Providers:  IncredibleEmployment.be  This test is no t yet approved or cleared by the [place] FDA and  has been authorized for detection and/or diagnosis of SARS-CoV-2 by FDA under an Emergency Use Authorization (EUA). This EUA will remain  in effect (meaning this test can be used) for the duration of the COVID-19 declaration under Section 564(b)(1) of the Act, 21 U.S.C.section 360bbb-3(b)(1), unless the authorization is terminated  or revoked sooner.       Influenza A by PCR NEGATIVE NEGATIVE Final   Influenza B by PCR NEGATIVE NEGATIVE Final    Comment: (NOTE) The Xpert Xpress SARS-CoV-2/FLU/RSV plus assay is intended as an aid in the diagnosis of influenza from Nasopharyngeal swab specimens and should not be used as a sole basis for treatment. Nasal washings and aspirates are unacceptable for Xpert Xpress SARS-CoV-2/FLU/RSV testing.  Fact Sheet for Patients: EntrepreneurPulse.com.au  Fact Sheet for Healthcare Providers: IncredibleEmployment.be  This test is not yet approved or cleared by the [place] FDA and has been authorized for detection and/or diagnosis of SARS-CoV-2 by FDA under an Emergency Use Authorization (EUA). This EUA will remain in effect (meaning this test can be used) for the duration of the COVID-19 declaration under Section 564(b)(1) of the Act, 21 U.S.C. section 360bbb-3(b)(1), unless the authorization is terminated or revoked.  Performed at [facility], [address] [place], [place]  [address]   Body fluid culture w Gram Stain     Status: None   Collection Time: 11/01/2021 11:40 PM   Specimen: Peritoneal Washings; Body Fluid  Result Value Ref Range Status   Specimen Description   Final    PERITONEAL DIALYSIS Performed at [facility], [address] [place], [place] [address]    Special Requests   Final    NONE Performed at [facility], [address] [place], [place] [address]    Gram Stain   Final    NO SQUAMOUS EPITHELIAL CELLS SEEN NO WBC SEEN NO ORGANISMS SEEN    Culture   Final    NO GROWTH 3 DAYS Performed at [facility], [place] [address] [place], [place] [address]    Report Status 10/20/2021 FINAL  Final  MRSA Next Gen by PCR, Nasal     Status: None   Collection Time: 10/18/21  1:40 PM   Specimen: Nasal Mucosa;  Nasal Swab  Result Value Ref Range Status   MRSA by PCR Next Gen NOT DETECTED NOT DETECTED Final    Comment: (NOTE) The GeneXpert MRSA Assay (FDA approved for NASAL specimens only), is one component of a comprehensive MRSA colonization surveillance program. It is not intended to diagnose MRSA infection nor to guide or monitor treatment for MRSA infections. Test performance is not FDA approved in patients less than 44 years old. Performed at [facility], [address] [place],  [address]     Coagulation Studies: Recent Labs    10/31/21 0745  LABPROT 13.0  INR 1.0      Urinalysis: No results for input(s): COLORURINE, LABSPEC, PHURINE, GLUCOSEU, HGBUR, BILIRUBINUR, KETONESUR, PROTEINUR, UROBILINOGEN, NITRITE, LEUKOCYTESUR in the last 72 hours.  Invalid input(s): APPERANCEUR    Imaging: No results found.   Medications:    dialysis solution 1.5% low-MG/low-CA Stopped (10/15/2021 1035)   feeding supplement (OSMOLITE 1.2 CAL) 1,000 mL (10/30/21 0511)    chlorhexidine  15 mL Mouth/Throat QID   Chlorhexidine Gluconate Cloth  6 each Topical Daily   cloNIDine  0.2 mg Transdermal Weekly    collagenase   Topical Daily   darbepoetin (ARANESP) injection - NON-DIALYSIS  100 mcg Subcutaneous Q Thu-1800   [START ON 11/02/2021] dexamethasone (DECADRON) injection  4 mg Intravenous Q24H   feeding supplement (NEPRO CARB STEADY)  237 mL Oral TID   free water  80 mL Per Tube Q6H   gentamicin cream  1 application Topical Daily   hydrALAZINE  50 mg Per Tube Q8H   insulin aspart  0-15 Units Subcutaneous Q4H   insulin glargine-yfgn  5 Units Subcutaneous Daily   isosorbide mononitrate  30 mg Oral Daily   magic mouthwash w/lidocaine  10 mL Oral QID   multivitamin  1 tablet Per Tube QHS   pantoprazole sodium  40 mg Per Tube Daily   acetaminophen **OR** acetaminophen, benzocaine, hydrALAZINE, ondansetron **OR** ondansetron (ZOFRAN) IV  Assessment/ Plan:  [name] is a 81 y.o. white female with end stage renal disease on peritoneal dialysis, diabetes mellitus type II insulin dependent, hypertension, and hyperlipidemia who is admitted to [facility] on 10/27/2021 for Facial swelling [R22.0] Angioedema, initial encounter [T78.3XXA] Fever, unspecified fever cause [R50.9] Angioedema [T78.3XXA]  [facility] 59kg CCPD 5 exchanges 2 liter fills 9 hours  End Stage Renal Disease:  Received dialysis yesterday, UF goal 1L achieved. Will increase UF with treatment. Will monitor fluid volume from meds and tube feeds and adjust UF with dialysis as needed.  Dialysis coordinator has confirmed outpatient clinic at [facility] on MWF schedule.   02/27 0701 - 02/28 0700 In: 0  Out: 1011    Hypertension:   ACE inhibitor and ARB initially held due to angioedema.  Patient currently receiving clonidine, hydralazine,  metoprolol and Isosorbide   Blood pressure 143/67  Anemia of chronic kidney disease:  Mircera as outpatient given 09/27/2021.    Lab Results  Component Value Date   HGB 8.1 (L) 11/01/2021  Aranesp ordered weekly (Thursday) Hgb not at desired  target Patient may need PRBC as hemoglobin is less than 7 Will transition to IV EPO with treatments  4.  Angioedema-believed due to prescribed ACE-I.   Transferred ourt of ICU on 10/20/21.  Continues receiving dexamethasone  ENT following and recommending oral rinsing     LOS: [place] 2/28/202312:54 PM

## 2021-11-01 NOTE — Progress Notes (Signed)
Patient noted to have Stage III to coccyx and bilateral buttocks.   Assessment as follows:  Coccyx 8 x 3 x 0.2 cm Right buttocks 0.8 x 2 x 0.2 cm Left Buttocks 0.2 x 0.2 x 0.1 cm  All wound beds yellowish in color with erythema surrounding. Scant drainage.   New foam dressing applied.

## 2021-11-01 NOTE — Progress Notes (Signed)
Palliative Care Progress Note, Assessment & Plan   Patient Name: Catherine Burke       Date: 11/01/2021 DOB: 1941/08/30  Age: 81 y.o. MRN#: [id number] Attending Physician: Sidney Ace, MD Primary Care Physician: Idelle Crouch, MD Admit Date: 10/18/2021  Reason for Consultation/Follow-up: Establishing goals of care  Subjective: Patient is sitting in bed with NG tube in place.  She is in no apparent distress, acknowledges my presence, and is able to make her wishes known.  She denies pain.  HPI: 81 y.o. female  with a PMH of ESRD on PD, HTN, and IDDM (A1C 5.7) admitted on 10/12/2021 with facial swelling.  Facial swelling thought to be due to angioedema secondary to ACE inhibitor use. ACE inhibitor's were discontinued and added to allergy list.    Hospitalization has been prolonged due to slow progress back to oral intake and tongue bleeding.    Patient was struggling with PO intake d/t tongue swelling and bleeding and NG tube placed for nutrition.    Patient also with dialysis cath placed 2/24 and tolerated HD without complication yesterday, 2/70.  Summary of counseling/coordination of care: After reviewing the patient's chart and assessing the patient, I met with Dr. Leverne Humbles at bedside.  He was just completing his discussion with the patient regarding reasons to avoid PEG tube placement at this time.  After their discussion was complete, I stayed with the patient's to further discuss her plan of care.   She again confirmed understanding of continuing current plan of care. She is in agreement to continue NG tube for nutrition and aggressive mouth care to improve tongue healing. SHe requested gauze and water for mouth care. She declines chlorhexidine, saying it burns.   When asked if she would  like for me to speak with other members of her family she shook her head and verbalized "no". I reiterated Dr. Priscella Mann words that decisions are made within the context of the patient's goals and wishes and that she ultimately has the final choice in how to proceed with her care. I also I shared I can speak with family for informational purposes and she again shook her head no.   At her request I gave her a fresh pack of gauze and sterile water.  Questions and concerns were addressed.   Code Status: Limited code  Prognosis: Unable to determine  Discharge Planning: To Be Determined  Recommendations/Plan: Aggressive mouth care Continue NG tube Pt continues to decline PEG placement if needed in future  Care plan was discussed with Dr. Priscella Mann, patient  Physical Exam Vitals and nursing note reviewed.  Constitutional:      General: She is not in acute distress.    Appearance: Normal appearance. She is not ill-appearing.  HENT:     Head: Normocephalic.     Nose:     Comments: NG in place    Mouth/Throat:     Comments: sloughing Eyes:     Pupils: Pupils are equal, round, and reactive to light.  Pulmonary:     Effort: Pulmonary effort is normal.  Abdominal:     Palpations: Abdomen is soft.  Musculoskeletal:     Comments: Generalized weakness  Skin:  General: Skin is warm.     Comments: Bilateral UE +1 non-pitting edema  Neurological:     Mental Status: She is alert and oriented to person, place, and time. Mental status is at baseline.  Psychiatric:        Mood and Affect: Mood normal.        Behavior: Behavior normal.        Thought Content: Thought content normal.        Judgment: Judgment normal.            Palliative Assessment/Data: 50%    Total Time 25 minutes  Greater than 50%  of this time was spent counseling and coordinating care related to the above assessment and plan.  Thank you for allowing the Palliative Medicine Team to assist in the care of this  patient.  [place] Ilsa Iha, FNP-BC Palliative Medicine Team Team Phone # [phone]

## 2021-11-01 NOTE — Progress Notes (Signed)
Speech Language Pathology Treatment: Dysphagia  Patient Details Name: Catherine Burke MRN: [id number] DOB: [birth year] Today's Date: 11/01/2021 Time: 1030-1130 SLP Time Calculation (min) (ACUTE ONLY): 60 min  Assessment / Plan / Recommendation Clinical Impression  Pt seen today for ongoing assessment of readiness to re-initiate po's again, in hopes to establish an oral diet. Pt now has an NGT in place for nutritional support. Family was present this session.  Pt was resting in bed; positioning support w/ pillows under arms given for more upright sitting and for self-feeding. Speech more intelligible each session; increased lingual movements noted. Pt stated she felt "better" though still weak. NGT in place for nutritional support. Noted slight congested cough at rest prior to tx session; less drooling noted.  Session initiated w/ focus on oral care, and education of such to address oral stimulation for lingual movements and swallowing as well as provide oral hygiene. Pt is noting increased sloughing of oral mucosa; noted more along laterals and underneath tongue. Moderate sloughing of tissue along Left lateral side of tongue completed w/ indention along Left lateral. Lingual mucosa appears improved; pink. Continue to note reduced lingual movements for protrusion and lateral sweeping in general but improving somewhat. Suspect impact from healing tongue/oral cavity. Noted edema in extremities. NGT in place for TFs.   Oral care completed. Pt stated she is still having min increased saliva -- gave wash clothes to wipe mouth, lip then oral moisturizer. Instructed pt to continue oral care frequently as instructed and as tolerates during the day for stimulation/swallowing.    Pt fed self trials of Nectar liquids via TSP and purees w/ improved lingual movements for bolus control and A-P transfer movements for swallowing. No immediate, overt clinical s/s of aspiration were noted during trials; vocal quality clear  when assessed and no decline in O2 sats(97%). However, noted mild throat clearing b/t trials intermittently (as noted at baseline) and a wet vocal quality intermittently (as at baseline) -- suspect impact from increased saliva as well as the bolus material mixing in the pharynx/larynx. Oral phase time appeared grossly adequate though min deliberate lingual movements observed(pt agreed) w/ the puree trials d/t the increased effort and coordination of tongue movements w/ increased textured trials. Oral phase time, and pharyngeal swallows, were more timely and complete w/ Nectar liquids. Suspect delayed pharyngeal swallow initiation and/or residue remaining post swallow overall; no trials of thin liquids were attempted d/t this. Instructed pt to take extra time for lingual sweeping and f/u, Dry swallow to fully clear(bolus trials mixed w/ pt's saliva) AND to use a throat clear intermittently to aid airway protection. No anterior loss occurred.   Recommend initiation of Dysphagia level 1(puree foods) w/ Nectar liquids by TSP today; aspiration precautions. Lingual sweeping and f/u, Dry swallows as needed to fully clear orally; throat clearing strategy. Frequent oral hygiene. Discussed and educated pt/Son/NSG on initiation of diet; MD updated. ST services will continue to follow pt's status for toleration of diet; trials to upgrade. Dietician updated.       HPI HPI: Pt  is a 81 y.o. 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  According to the patient and family since yesterday, patient was admitted to [facility] hospital 2 days ago for pneumoperitoneum suspected to be related to peritoneal dialysis, discharged home and then return to the emergency department at [facility] yesterday for bilateral shoulder pain, mild  abdominal pain which was evaluated with no  emergent or is new abnormal findings; no pneumonia.  She was discharged home without any interventions and states the orofacial issues have worsened since yesterday.  Per chart review these issues were not noted or evaluated in the emergency department yesterday.  She did have a low-grade fever yesterday per family members but no fever today.  No known dental issues that they are aware of.  She has not been eating or drinking well because of the pain in her swollen mouth/facial area and difficulty swallowing.  Not trying any medications for symptoms thus far.  History of similar issues in the past.   CT of Neck: Right lower facial soft tissue swelling, possibly cellulitis. No  drainable fluid collection identified.  2. Moderate hypopharyngeal swelling, new from 34/91/7915 and of  uncertain etiology though possibly infectious or allergic.   CXR: no active disease.      SLP Plan  Continue with current plan of care      Recommendations for follow up therapy are one component of a multi-disciplinary discharge planning process, led by the attending physician.  Recommendations may be updated based on patient status, additional functional criteria and insurance authorization.    Recommendations  Diet recommendations: Dysphagia 1 (puree);Nectar-thick liquid Liquids provided via: Teaspoon Medication Administration: Via alternative means Supervision: Patient able to self feed;Intermittent supervision to cue for compensatory strategies (setup and positioning) Compensations: Minimize environmental distractions;Slow rate;Small sips/bites;Lingual sweep for clearance of pocketing;Multiple dry swallows after each bite/sip;Follow solids with liquid Postural Changes and/or Swallow Maneuvers: Out of bed for meals;Seated upright 90 degrees;Upright 30-60 min after meal                General recommendations:  (Dietician f/u; Palliative Care f/u; GI f/u for education) Oral Care Recommendations: Oral care BID;Oral  care before and after PO;Patient independent with oral care (support) Follow Up Recommendations: Skilled nursing-short term rehab (<3 hours/day) Assistance recommended at discharge: Intermittent Supervision/Assistance SLP Visit Diagnosis: Dysphagia, oropharyngeal phase (R13.12) (angioedema impact) Plan: Continue with current plan of care             Orinda Kenner, [place], [place]; [place] [phone] (ascom) Josaiah Muhammed  11/01/2021, 3:49 PM

## 2021-11-01 NOTE — Progress Notes (Signed)
PT Cancellation Note  Patient Details Name: Catherine Burke MRN: [id number] DOB: 07-04-1941   Cancelled Treatment:    Reason Eval/Treat Not Completed: Patient not medically ready.  Chart reviewed.  Nurse recommending holding PT at this time d/t pt's current status (pt not medically appropriate for therapy).  Low platelet count noted.  Will re-attempt PT session at a later date/time as medically appropriate.  Leitha Bleak, PT 11/01/21, 3:07 PM

## 2021-11-01 NOTE — Progress Notes (Signed)
PROGRESS NOTE    Catherine Burke  NOB:[id number] DOB: August 05, 1941 DOA: 10/11/2021 PCP: Idelle Crouch, MD    Brief Narrative:  81 year old with medical history of end-stage renal disease on peritoneal dialysis, hypertension, insulin-dependent diabetes mellitus, who presented emergency department for chief concerns of right facial swelling.   Facial swelling thought to be due to angioedema secondary to ACE inhibitor use.  ACE inhibitor's been discontinued and added to allergy list.   On 2/14, Patient had worsening of her facial and tongue swelling.  Pt transferred to [facility] and PCCM consulted on 2/14 for close monitoring.  Pt was transferred back to the floor after tongue swelling improved.  Hospitalization prolonged due to slow progress back to oral intake and now tongue bleeding.  Angioedema has improved however tongue swelling was an issue.  Seen in consultation by ENT.  No aggressive management recommended.  Tongue swelling has improved but not yet totally resolved.  Facial swelling again has improved but not yet resolved.  Patient having difficulty with p.o. medications and oral intake.  2/23: Patient still not doing well with p.o. intake or oral medications.  Discussed the possibility of image guided NGT placement.  Patient equivocal and request I speak with family.  2/24: Patient and family in agreement.  NG tube placed 2/23.  Tube feeds initiated.  Patient had vascular consulted to place Vas-Cath on 2/24.  Having some oozing from catheter site otherwise stable.  Nephrology following for inpatient HD needs.  2/27: Patient became upset yesterday.  Wanted NG tube out.  Bedside RN and the Dr. Posey Pronto spoke to patient and family at bedside.  2/28: Lengthy conversation with patient at bedside this morning.  Explained that at this time she is demonstrating some signs of improvement however was unsafe to pull NG tube out unless she is able to eat and swallow reliably.  She expressed  understanding.  She also clearly said no when asked about placement of a PEG tube.  Deteriorating platelet count.  Hematology evaluation requested today.    Assessment & Plan:   Principal Problem:   Angioedema, initial encounter Active Problems:   Hyperlipidemia   Essential hypertension   GERD (gastroesophageal reflux disease)   Chronic kidney disease (CKD), stage IV (severe) (HCC)   ESRD (end stage renal disease) (HCC)   Facial cellulitis   Malnutrition of moderate degree   Angioedema  Angioedema --thought to be due to home telmisartan s/p -Solu-Medrol 125 mg x 1, Benadryl 25 mg x 1, Pepcid 20 mg IV x1, EpiPen x1 then started on IV decadron 6 mg q6h.  After tongue swelling improved, pt was started on steroid taper. -Difficulty tolerating oral Decadron -NGT placed 2/23 Plan: Continue Decadron, dose decreased to 4 mg IV daily ARB/ACE inhibitors added to allergy list SLP reevaluation pending   Tongue bleeding --became more profuse early morning on 2/20.  From sloughing and possible trauma following angioedema. --ENT performed bedside debridement and suctioning  --Exam slowly improving Plan: Peridex mouth rinses NGT in place.  Patient on tube feeds Appreciate follow-up from ENT Outpatient follow-up 1 week postdischarge  Thrombocytopenia Unclear etiology Patient's had a significant decline in her platelet count over last 48 hours Has been trending downward, suspect reactive secondary to multiple acute issues Plan: Monitor for now No indication for platelet transfusion Avoid anticoagulants Hematology consult requested, spoke with Dr. Tasia Catchings   Dysphagia --due to tongue swelling and poor lingual control of boluses and coordination, slowly improving.  Oral intake remains poor -NGT  placed 2/23 Plan: RD following for tube feeds SLP reevaluation pending   Essential hypertension Poor control over interval --home ARB telmisartan d/c'ed Plan: Transition home antihypertensive  regimen to per tube is much as possible   ESRD on peritoneal dialysis (end stage renal disease) ([place]) I will switch to hemodialysis  vascular surgery was attempting PermCath placement.  This was delayed due to tongue swelling -Patient now more agreeable to SNF placement -Vascular reengaged for catheter placement -Catheter placed 2/24 Plan: Nephrology follow-up for inpatient HD needs   Facial cellulitis, ruled out Sepsis, ruled out Facial cellulitis felt unlikely - DC'ed antibiotics   Anemia of CKD --Mircera as outpatient   Afib, isolated episode --noted early morning 2/15, new dx, likely related to acute illness --started on heparin gtt, since d/c'ed --currently rate controlled and back to NSR   Hypokalemia --monitor and replete PRN   DM2, well controlled Hyperglycemia exacerbated by steroid use --A1c 5.7 -- Discontinued glargine due to hypoglycemia -- Moderate SSI   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  --nonemergent contrast enhanced brain MRI for further evaluation as outpatient     DVT prophylaxis: SQ Lovenox Code Status: Partial Family Communication: Son via phone on 2/23, left VM for son [name] [phone] on 2/25, daughter via phone 2/26, 2/27, 2/28 Disposition Plan: Status is: Inpatient Remains inpatient appropriate because: Poor p.o. intake status post angioedema and resultant bleeding.  Disposition plan unclear at   Level of care: Telemetry Medical  Consultants:  Nephrology Vascular surgery ENT  Procedures:  None  Antimicrobials: None   Subjective: Patient seen and examined.  More awake and alert this morning.  Palliative care at bedside  Objective: Vitals:   11/01/21 0424 11/01/21 0500 11/01/21 0645 11/01/21 0833  BP: (!) 120/47  (!) 126/40 (!) 143/67  Pulse: 87   83  Resp: 20   18  Temp: 99.8 F (37.7 C)   97.9 F (36.6 C)  TempSrc: Oral   Axillary  SpO2: 97%   98%   Weight:  60.7 kg    Height:        Intake/Output Summary (Last 24 hours) at 11/01/2021 1135 Last data filed at 10/31/2021 2244 Gross per 24 hour  Intake 0 ml  Output 1011 ml  Net -1011 ml   Filed Weights   10/31/21 1203 10/31/21 1615 11/01/21 0500  Weight: 62.7 kg 61.7 kg 60.7 kg    Examination:  General exam: No acute distress.  Appears frail.  Appears fatigued Respiratory system: Coarse breath sounds bilaterally.  Normal work of breathing.  Room air Cardiovascular system: S1-S2, RRR, no murmurs, no pedal edema Gastrointestinal system: Soft, NT/ND, normal bowel sounds, Dobbhoff in place Central nervous system: Alert and oriented. No focal neurological deficits. Extremities: Symmetrical decreased power Skin: No rashes, lesions or ulcers Psychiatry: Judgement and insight appear normal. Mood & affect appropriate.     Data Reviewed: I have personally reviewed following labs and imaging studies  CBC: Recent Labs  Lab 10/24/2021 0509 10/29/21 0503 10/30/21 0450 10/31/21 0358 11/01/21 0455  WBC 21.1* 20.1* 15.1* 12.5* 10.4  HGB 7.4* 7.1* 6.7* 7.8* 8.1*  HCT 23.7* 22.5* 21.3* 24.1* 25.2*  MCV 87.1 87.2 88.8 88.0 89.0  PLT 242 182 127* 82* 69*   Basic Metabolic Panel: Recent Labs  Lab 10/26/21 0511 10/27/21 0437 10/23/2021 0509 10/18/2021 1108 10/07/2021 1649 10/29/21 0503 10/29/21 1642 10/31/21 1300  NA 134* 133* 133*  --   --  134*  --  131*  K 4.0 3.4* 3.3*  --   --  4.0  --  5.1  CL 99 99 98  --   --  98  --  96*  CO2 22 23 22   --   --  24  --  26  GLUCOSE 198* 291* 222*  --   --  221*  --  209*  BUN 88* 84* 84*  --   --  78*  --  64*  CREATININE 5.92* 5.85* 6.15*  --   --  5.71*  --  4.50*  CALCIUM 7.6* 7.6* 7.3*  --   --  7.4*  --  7.7*  MG 1.9 1.7 1.7 1.7 1.7 1.8 1.7  --   PHOS  --   --   --  6.3* 5.7* 5.7* 3.9 4.8*   GFR: Estimated Creatinine Clearance: 8.3 mL/min (A) (by C-G formula based on SCr of 4.5 mg/dL (H)). Liver Function Tests: Recent Labs  Lab  10/31/21 1300  ALBUMIN 1.8*   No results for input(s): LIPASE, AMYLASE in the last 168 hours. No results for input(s): AMMONIA in the last 168 hours. Coagulation Profile: Recent Labs  Lab 10/31/21 0745  INR 1.0   Cardiac Enzymes: No results for input(s): CKTOTAL, CKMB, CKMBINDEX, TROPONINI in the last 168 hours. BNP (last 3 results) No results for input(s): PROBNP in the last 8760 hours. HbA1C: No results for input(s): HGBA1C in the last 72 hours. CBG: Recent Labs  Lab 10/31/21 1743 10/31/21 2158 11/01/21 0012 11/01/21 0430 11/01/21 0749  GLUCAP 148* 260* 201* 182* 212*   Lipid Profile: No results for input(s): CHOL, HDL, LDLCALC, TRIG, CHOLHDL, LDLDIRECT in the last 72 hours. Thyroid Function Tests: No results for input(s): TSH, T4TOTAL, FREET4, T3FREE, THYROIDAB in the last 72 hours. Anemia Panel: Recent Labs    11/01/21 0455 11/01/21 0917  FOLATE  --  15.1  RETICCTPCT 3.2*  --    Sepsis Labs: No results for input(s): PROCALCITON, LATICACIDVEN in the last 168 hours.  No results found for this or any previous visit (from the past 240 hour(s)).        Radiology Studies: No results found.      Scheduled Meds:  chlorhexidine  15 mL Mouth/Throat QID   Chlorhexidine Gluconate Cloth  6 each Topical Daily   cloNIDine  0.2 mg Transdermal Weekly   collagenase   Topical Daily   darbepoetin (ARANESP) injection - NON-DIALYSIS  100 mcg Subcutaneous Q Thu-1800   [START ON 11/02/2021] dexamethasone (DECADRON) injection  4 mg Intravenous Q24H   feeding supplement (NEPRO CARB STEADY)  237 mL Oral TID   free water  80 mL Per Tube Q6H   gentamicin cream  1 application Topical Daily   hydrALAZINE  50 mg Per Tube Q8H   insulin aspart  0-15 Units Subcutaneous Q4H   insulin glargine-yfgn  5 Units Subcutaneous Daily   isosorbide mononitrate  30 mg Oral Daily   magic mouthwash w/lidocaine  10 mL Oral QID   multivitamin  1 tablet Per Tube QHS   pantoprazole sodium  40 mg  Per Tube Daily   Continuous Infusions:  dialysis solution 1.5% low-MG/low-CA Stopped (10/22/2021 1035)   feeding supplement (OSMOLITE 1.2 CAL) 1,000 mL (10/30/21 0511)     LOS: 14 days      Sidney Ace, MD [facility]   If 7PM-7AM, please contact night-coverage  11/01/2021, 11:35 AM

## 2021-11-02 DIAGNOSIS — N186 End stage renal disease: Secondary | ICD-10-CM | POA: Diagnosis not present

## 2021-11-02 DIAGNOSIS — I4891 Unspecified atrial fibrillation: Secondary | ICD-10-CM

## 2021-11-02 DIAGNOSIS — K219 Gastro-esophageal reflux disease without esophagitis: Secondary | ICD-10-CM

## 2021-11-02 DIAGNOSIS — E785 Hyperlipidemia, unspecified: Secondary | ICD-10-CM

## 2021-11-02 DIAGNOSIS — T783XXA Angioneurotic edema, initial encounter: Secondary | ICD-10-CM | POA: Diagnosis not present

## 2021-11-02 DIAGNOSIS — I10 Essential (primary) hypertension: Secondary | ICD-10-CM

## 2021-11-02 DIAGNOSIS — N184 Chronic kidney disease, stage 4 (severe): Secondary | ICD-10-CM | POA: Diagnosis not present

## 2021-11-02 LAB — CBC
HCT: 24.8 % — ABNORMAL LOW (ref 36.0–46.0)
Hemoglobin: 7.8 g/dL — ABNORMAL LOW (ref 12.0–15.0)
MCH: 28.6 pg (ref 26.0–34.0)
MCHC: 31.5 g/dL (ref 30.0–36.0)
MCV: 90.8 fL (ref 80.0–100.0)
Platelets: 63 10*3/uL — ABNORMAL LOW (ref 150–400)
RBC: 2.73 MIL/uL — ABNORMAL LOW (ref 3.87–5.11)
RDW: 17.9 % — ABNORMAL HIGH (ref 11.5–15.5)
WBC: 7.9 10*3/uL (ref 4.0–10.5)
nRBC: 0 % (ref 0.0–0.2)

## 2021-11-02 LAB — GLUCOSE, CAPILLARY
Glucose-Capillary: 162 mg/dL — ABNORMAL HIGH (ref 70–99)
Glucose-Capillary: 199 mg/dL — ABNORMAL HIGH (ref 70–99)
Glucose-Capillary: 213 mg/dL — ABNORMAL HIGH (ref 70–99)
Glucose-Capillary: 253 mg/dL — ABNORMAL HIGH (ref 70–99)
Glucose-Capillary: 255 mg/dL — ABNORMAL HIGH (ref 70–99)
Glucose-Capillary: 259 mg/dL — ABNORMAL HIGH (ref 70–99)

## 2021-11-02 LAB — IMMATURE PLATELET FRACTION: Immature Platelet Fraction: 6.2 % (ref 1.2–8.6)

## 2021-11-02 MED ORDER — DEXAMETHASONE SODIUM PHOSPHATE 4 MG/ML IJ SOLN
2.0000 mg | Freq: Every day | INTRAMUSCULAR | Status: DC
Start: 2021-11-07 — End: 2021-11-03
  Filled 2021-11-02: qty 0.5

## 2021-11-02 MED ORDER — HEPARIN SODIUM (PORCINE) 1000 UNIT/ML IJ SOLN
INTRAMUSCULAR | Status: AC
  Filled 2021-11-02: qty 10

## 2021-11-02 MED ORDER — DEXAMETHASONE SODIUM PHOSPHATE 4 MG/ML IJ SOLN
4.0000 mg | Freq: Every day | INTRAMUSCULAR | Status: DC
  Administered 2021-11-02 – 2021-11-03 (×2): 4 mg via INTRAVENOUS
  Filled 2021-11-02 (×3): qty 1

## 2021-11-02 NOTE — Progress Notes (Signed)
PT Cancellation Note ? ?Patient Details ?Name: Catherine Burke ?MRN: [id number] ?DOB: 08-18-1941 ? ? ?Cancelled Treatment:     PT attempt. Pt was off floor at HD. Will continue to follow and progress as able per current POC.  ? ? ?Willette Pa ?11/02/2021, 10:41 AM ?

## 2021-11-02 NOTE — Progress Notes (Signed)
OT Cancellation Note ? ?Patient Details ?Name: KAITLEN REDFORD ?MRN: [id number] ?DOB: Mar 29, 1941 ? ? ?Cancelled Treatment:    Reason Eval/Treat Not Completed: Patient at procedure or test/ unavailable.Pt currently off the unit for HD. OT to follow up when pt is next available.  ? ?Darleen Crocker, MS, OTR/L , CBIS ?ascom [phone]  ?11/02/21, 11:29 AM  ?

## 2021-11-02 NOTE — Progress Notes (Signed)
?PROGRESS NOTE ? ?Catherine Burke    DOB: 04-Nov-1940, 81 y.o.  ?UMP:[id number]  ?  Code Status: Partial Code   ?DOA: 10/27/2021   LOS: 15  ? ?Brief hospital course  ?Catherine Burke is a 81 y.o. female with a PMH significant for ESRD on peritoneal dialysis, HTN, HLD, type II DM. ?They presented from home to the ED on 10/22/2021 with tongue and facial swelling x 1 days.  She did not have any recent medication changes. ?In the ED, it was found that they had angioedema.  ?They were treated with IV steroids, Benadryl 25 mg x 1, Pepcid 20 mg IV x1, EpiPen x1 then started on IV decadron 6 mg q6h.  ?Patient was admitted to medicine service for further workup and management of angioedema as outlined in detail below. ?Began having tongue bleeding as sequelae from swelling.  ENT evaluated and recommended packing. ?Patient placed on NG tube for feeding until able to tolerate p.o. diet. ? ?11/02/21 -stable, improved ? ?Assessment & Plan  ?Principal Problem: ?  Angioedema, initial encounter ?Active Problems: ?  Hyperlipidemia ?  Essential hypertension ?  GERD (gastroesophageal reflux disease) ?  Chronic kidney disease (CKD), stage IV (severe) (HCC) ?  ESRD (end stage renal disease) on dialysis [facility]) ?  Facial cellulitis ?  Malnutrition of moderate degree ?  Angioedema ?  Thrombocytopenia ([place]) ?  Anemia in ESRD (end-stage renal disease) ([place]) ? ?Angioedema-thought to be due to home telmisartan ?s/p -Solu-Medrol 125 mg x 1, Benadryl 25 mg x 1, Pepcid 20 mg IV x1, EpiPen x1 then started on IV decadron 6 mg q6h.  After tongue swelling improved, pt was started on steroid taper. ?-Difficulty tolerating oral Decadron ?-NGT placed 2/23 ?Plan: ?Continue Decadron, dose decreased to 4 mg IV daily ?ARB/ACE inhibitors added to allergy list ?SLP reevaluated ?  ?Tongue bleeding ?--became more profuse early morning on 2/20.  From sloughing and possible trauma following angioedema. ?--ENT performed bedside debridement and suctioning  ?--Exam slowly  improving ?Plan: ?Peridex mouth rinses ?NGT in place.  Patient on tube feeds ?Appreciate follow-up from ENT ?Outpatient follow-up 1 week postdischarge ?  ?Thrombocytopenia ?Unclear etiology but likely reactive to multifactorial illnesses.  182>127>82>69>63 ?Heme/onc evaluated 2/28 and recommended continued monitoring ?-CBC a.m. ?  ?Dysphagia- NG tube placed 2/23. ?- SLP following, appreciate recs ?  ?Essential hypertension- hypotensive while in dialysis ?--home ARB telmisartan d/c'ed ?- holding antihypertensives ?- continue to monitor ?  ?ESRD on PD- now on HD. MWF-Catheter placed 2/24 ?- nephrology managing, appreciate recs ?  ?Anemia of CKD- hgb 7.8 ?--aranesp with HD ?- CBC am ?  ?Afib, isolated episode ?--noted early morning 2/15, new dx, likely related to acute illness ?--started on heparin gtt, since d/c'ed ?--currently rate controlled and back to NSR ?  ?Hypokalemia ?--monitor and replete PRN ?  ?DM2, well controlled--A1c 5.7 ?Hyperglycemia exacerbated by steroid use ?-- semglee 5units daily ?-- Moderate SSI ?  ?2 cm right sphenoid wing mass ?--incompletely imaged but favored to ?represent a meningioma.  ?--nonemergent contrast enhanced brain ?MRI for further evaluation as outpatient ? ?Body mass index is 22.97 kg/m?. ? ?VTE ppx: Place TED hose Start: 10/24/2021 1846 ? ? ?Diet:  ?   ?Diet  ? DIET - DYS 1 Room service appropriate? Yes with Assist; Fluid consistency: Nectar Thick  ? ?Subjective 11/02/21   ? ?Pt reports improvement today. Denies any concerns or questions at this time.  ?  ?Objective  ? ?Vitals:  ? 11/01/21 1723 11/01/21 1951  11/02/21 0358 11/02/21 0500  ?BP: 123/60 133/62 (!) 144/52   ?Pulse: 84 76 76   ?Resp: 17 20 15    ?Temp: 98.1 ?F (36.7 ?C) 99.3 ?F (37.4 ?C)    ?TempSrc: Oral Oral    ?SpO2: 100% 100% 99%   ?Weight:    60.7 kg  ?Height:      ? ? ?Intake/Output Summary (Last 24 hours) at 11/02/2021 0725 ?Last data filed at 11/02/2021 3794 ?Gross per 24 hour  ?Intake 632.5 ml  ?Output --  ?Net 632.5  ml  ? ?Filed Weights  ? 10/31/21 1615 11/01/21 0500 11/02/21 0500  ?Weight: 61.7 kg 60.7 kg 60.7 kg  ?  ? ?Physical Exam:  ?General: awake, alert, NAD ?HEENT: atraumatic, clear conjunctiva, anicteric sclera, MMM, hearing grossly normal. NG tube in place ?Respiratory: normal respiratory effort. ?Cardiovascular: quick capillary refill  ?Gastrointestinal: soft, NT, ND ?Nervous: A&O x3. no gross focal neurologic deficits, normal speech ?Extremities: moves all equally, positive non-pitting edema, decreased tone ?Skin: dry, intact, normal temperature, normal color. Ecchymosis throughout arms ?Psychiatry: flat mood, congruent affect ? ?Labs   ?I have personally reviewed the following labs and imaging studies ?CBC ?   ?Component Value Date/Time  ? WBC 7.9 11/02/2021 0553  ? RBC 2.73 (L) 11/02/2021 0553  ? HGB 7.8 (L) 11/02/2021 0553  ? HCT 24.8 (L) 11/02/2021 0553  ? PLT 63 (L) 11/02/2021 0553  ? MCV 90.8 11/02/2021 0553  ? MCH 28.6 11/02/2021 0553  ? MCHC 31.5 11/02/2021 0553  ? RDW 17.9 (H) 11/02/2021 0553  ? LYMPHSABS 0.2 (L) 10/17/2021 0503  ? MONOABS 0.2 10/17/2021 0503  ? EOSABS 0.0 10/17/2021 0503  ? BASOSABS 0.0 10/17/2021 0503  ? ?BMP Latest Ref Rng & Units 10/31/2021 10/29/2021 10/24/2021  ?Glucose 70 - 99 mg/dL 209(H) 221(H) 222(H)  ?BUN 8 - 23 mg/dL 64(H) 78(H) 84(H)  ?Creatinine 0.44 - 1.00 mg/dL 4.50(H) 5.71(H) 6.15(H)  ?Sodium 135 - 145 mmol/L 131(L) 134(L) 133(L)  ?Potassium 3.5 - 5.1 mmol/L 5.1 4.0 3.3(L)  ?Chloride 98 - 111 mmol/L 96(L) 98 98  ?CO2 22 - 32 mmol/L 26 24 22   ?Calcium 8.9 - 10.3 mg/dL 7.7(L) 7.4(L) 7.3(L)  ? ? ?No results found. ? ?Disposition Plan & Communication  ?Patient status: Inpatient  ?Admitted From: Home ?Planned disposition location: Skilled nursing facility ?Anticipated discharge date: TBD pending improvement in PO intake ? ?Family Communication: none  ?  ?Author: ?Richarda Osmond, DO ?[facility] ?11/02/2021, 7:25 AM  ? ?Available by Epic secure chat 7AM-7PM. ?If 7PM-7AM,  please contact night-coverage.  ?[facility] contact information found on [email or web address] ? ?

## 2021-11-02 NOTE — TOC Progression Note (Signed)
Transition of Care (TOC) - Progression Note  ? ? ?Patient Details  ?Name: Catherine Burke ?MRN: [id number] ?Date of Birth: 02/19/1941 ? ?Transition of Care (TOC) CM/SW Contact  ?Pete Pelt, RN ?Phone Number: ?11/02/2021, 12:18 PM ? ?Clinical Narrative:   Patient has bed at [facility] Palliative has seen patient, anticipated discharge in 4 or more days due to continued medical workup. ? ? ? ?Expected Discharge Plan: [place] ?Barriers to Discharge: Continued Medical Work up ? ?Expected Discharge Plan and Services ?Expected Discharge Plan: [place] ?  ?Discharge Planning Services: CM Consult ?  ?Living arrangements for the past 2 months: [place] ?                ?DME Arranged: N/A ?DME Agency: NA ?  ?  ?  ?  ?  ?  ?  ?  ? ? ?Social Determinants of Health (SDOH) Interventions ?  ? ?Readmission Risk Interventions ?Readmission Risk Prevention Plan 10/19/2021  ?Transportation Screening Complete  ?Medication Review Press photographer) Complete  ?PCP or Specialist appointment within 3-5 days of discharge Complete  ?[place] or Home Care Consult Complete  ?SW Recovery Care/Counseling Consult Complete  ?Palliative Care Screening Not Applicable  ?Skilled Nursing Facility Complete  ?Some recent data might be hidden  ? ? ?

## 2021-11-02 NOTE — Progress Notes (Signed)
Nutrition Follow-up ? ?DOCUMENTATION CODES:  ? ?Non-severe (moderate) malnutrition in context of chronic illness ? ?INTERVENTION:  ? ?-Continue TF via NGT:  ? ?Osmolite 1.2 @ 55 ml/hr  ?  ?80 ml free water flush every 6 hours ?  ?Tube feeding regimen provides 1584 kcal, 73 grams of protein, and 1082 ml of H2O. Total free water: 1402 ml daily ? ?NUTRITION DIAGNOSIS:  ? ?Moderate Malnutrition related to chronic illness (ESRD on PD) as evidenced by mild fat depletion, mild muscle depletion. ? ?Ongoing ? ?GOAL:  ? ?Patient will meet greater than or equal to 90% of their needs ? ?Met with TF ? ?MONITOR:  ? ?Diet advancement, Labs, Weight trends, Skin, I & O's ? ?REASON FOR ASSESSMENT:  ? ?Malnutrition Screening Tool ?  ? ?ASSESSMENT:  ? ?81 y/o female with h/o ESRD on PD, DM, HTN and HLD who is admitted with lip swelling and angioedema. ? ?2/18- advanced to nectar thick clear liquid diet with Nepro allowed ?2/20- advanced to dysphagia 1 diet with nectar thick liquids ?2/24- s/p HD cath placement, NGT placed, TF initiated ? ?Reviewed I/O's: +633 ml x 24 hours and +3.6 L since 10/19/21 ?  ?Pt down in HD at time of visit.  ? ?Per palliative care notes, pt does not desire PEG, but amenable to continuing NGT feedings at this time. Per SLP notes, pt allowed trials of dysphagia 1 diet with nectar thick liquids.  ? ?Labs reviewed: CBGS: 162-259 (inpatient orders for glycemic control are 5 units insulin glargine-yfgn daily and 0-15 units insulin aspart every 4 hours). ? ?Diet Order:   ?Diet Order   ? ?       ?  DIET - DYS 1 Room service appropriate? Yes with Assist; Fluid consistency: Nectar Thick  Diet effective now       ?  ? ?  ?  ? ?  ? ? ?EDUCATION NEEDS:  ? ?Education needs have been addressed ? ?Skin:  Skin Assessment: Skin Integrity Issues: ?Skin Integrity Issues:: DTI ?DTI: rt and lt sacrum ? ?Last BM:  10/24/21 ? ?Height:  ? ?Ht Readings from Last 1 Encounters:  ?10/18/21 _0  (1.626 m)  ? ? ?Weight:  ? ?Wt Readings from  Last 1 Encounters:  ?11/02/21 57.1 kg  ? ? ?Ideal Body Weight:  54.5 kg ? ?BMI:  Body mass index is 21.61 kg/m?. ? ?Estimated Nutritional Needs:  ? ?Kcal:  1400-1600kcal/day ? ?Protein:  70-80g/day ? ?Fluid:  UOP +1L ? ? ? ?Loistine Chance, RD, LDN, CDCES ?Registered Dietitian II ?Certified Diabetes Care and Education Specialist ?Please refer to [facility] for RD and/or RD on-call/weekend/after hours pager  ?

## 2021-11-02 NOTE — Progress Notes (Incomplete)
PROGRESS NOTE  BULA CAVALIERI    DOB: [birth year], 81 y.o.  TDV:[id number]    Code Status: Partial Code   DOA: 10/29/2021   LOS: 15   Brief hospital course  Catherine Burke is a 81 y.o. female with a PMH significant for ***. They presented from *** to the ED on 10/14/2021 with *** x *** days. *** In the ED, it was found that they had ***.  They were treated with ***.  Patient was admitted to medicine service for further workup and management of *** as outlined in detail below.  11/02/21 -***  Assessment & Plan  Principal Problem:   Angioedema, initial encounter Active Problems:   Hyperlipidemia   Essential hypertension   GERD (gastroesophageal reflux disease)   Chronic kidney disease (CKD), stage IV (severe) (HCC)   ESRD (end stage renal disease) on dialysis (HCC)   Facial cellulitis   Malnutrition of moderate degree   Angioedema   Thrombocytopenia (HCC)   Anemia in ESRD (end-stage renal disease) (HCC)  Angioedema --thought to be due to home telmisartan s/p -Solu-Medrol 125 mg x 1, Benadryl 25 mg x 1, Pepcid 20 mg IV x1, EpiPen x1 then started on IV decadron 6 mg q6h.  After tongue swelling improved, pt was started on steroid taper. -Difficulty tolerating oral Decadron -NGT placed 2/23 Plan: Continue Decadron, dose decreased to 4 mg IV daily ARB/ACE inhibitors added to allergy list SLP reevaluation pending   Tongue bleeding --became more profuse early morning on 2/20.  From sloughing and possible trauma following angioedema. --ENT performed bedside debridement and suctioning  --Exam slowly improving Plan: Peridex mouth rinses NGT in place.  Patient on tube feeds Appreciate follow-up from ENT Outpatient follow-up 1 week postdischarge   Thrombocytopenia Unclear etiology Patient's had a significant decline in her platelet count over last 48 hours Has been trending downward, suspect reactive secondary to multiple acute issues Plan: Monitor for now No indication for  platelet transfusion Avoid anticoagulants Hematology consult requested, spoke with Dr. Tasia Catchings   Dysphagia --due to tongue swelling and poor lingual control of boluses and coordination, slowly improving.  Oral intake remains poor -NGT placed 2/23 Plan: RD following for tube feeds SLP reevaluation pending   Essential hypertension Poor control over interval --home ARB telmisartan d/c'ed Plan: Transition home antihypertensive regimen to per tube is much as possible   ESRD on peritoneal dialysis (end stage renal disease) ([place]) I will switch to hemodialysis  vascular surgery was attempting PermCath placement.  This was delayed due to tongue swelling -Patient now more agreeable to SNF placement -Vascular reengaged for catheter placement -Catheter placed 2/24 Plan: Nephrology follow-up for inpatient HD needs   Facial cellulitis, ruled out Sepsis, ruled out Facial cellulitis felt unlikely - DC'ed antibiotics   Anemia of CKD --Mircera as outpatient   Afib, isolated episode --noted early morning 2/15, new dx, likely related to acute illness --started on heparin gtt, since d/c'ed --currently rate controlled and back to NSR   Hypokalemia --monitor and replete PRN   DM2, well controlled Hyperglycemia exacerbated by steroid use --A1c 5.7 -- Discontinued glargine due to hypoglycemia -- Moderate SSI   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  --nonemergent contrast enhanced brain MRI for further evaluation as outpatient  Body mass index is 22.97 kg/m.  VTE ppx: Place TED hose Start: 10/31/2021 1846   Diet:     Diet   DIET - DYS 1 Room service appropriate? Yes with  Assist; Fluid consistency: Nectar Thick   Subjective 11/02/21    Pt reports ***   Objective   Vitals:   11/01/21 1723 11/01/21 1951 11/02/21 0358 11/02/21 0500  BP: 123/60 133/62 (!) 144/52   Pulse: 84 76 76   Resp: 17 20 15    Temp: 98.1 F (36.7 C) 99.3  F (37.4 C)    TempSrc: Oral Oral    SpO2: 100% 100% 99%   Weight:    60.7 kg  Height:        Intake/Output Summary (Last 24 hours) at 11/02/2021 0725 Last data filed at 11/02/2021 2703 Gross per 24 hour  Intake 632.5 ml  Output --  Net 632.5 ml   Filed Weights   10/31/21 1615 11/01/21 0500 11/02/21 0500  Weight: 61.7 kg 60.7 kg 60.7 kg     Physical Exam: *** General: awake, alert, NAD HEENT: atraumatic, clear conjunctiva, anicteric sclera, MMM, hearing grossly normal Respiratory: normal respiratory effort. Cardiovascular: normal S1/S2, RRR, no JVD, murmurs, quick capillary refill  Gastrointestinal: soft, NT, ND Nervous: A&O x3. no gross focal neurologic deficits, normal speech Extremities: moves all equally, no edema, normal tone Skin: dry, intact, normal temperature, normal color. No rashes, lesions or ulcers on exposed skin Psychiatry: normal mood, congruent affect  Labs   I have personally reviewed the following labs and imaging studies CBC    Component Value Date/Time   WBC 7.9 11/02/2021 0553   RBC 2.73 (L) 11/02/2021 0553   HGB 7.8 (L) 11/02/2021 0553   HCT 24.8 (L) 11/02/2021 0553   PLT 63 (L) 11/02/2021 0553   MCV 90.8 11/02/2021 0553   MCH 28.6 11/02/2021 0553   MCHC 31.5 11/02/2021 0553   RDW 17.9 (H) 11/02/2021 0553   LYMPHSABS 0.2 (L) 10/17/2021 0503   MONOABS 0.2 10/17/2021 0503   EOSABS 0.0 10/17/2021 0503   BASOSABS 0.0 10/17/2021 0503   BMP Latest Ref Rng & Units 10/31/2021 10/29/2021 10/27/2021  Glucose 70 - 99 mg/dL 209(H) 221(H) 222(H)  BUN 8 - 23 mg/dL 64(H) 78(H) 84(H)  Creatinine 0.44 - 1.00 mg/dL 4.50(H) 5.71(H) 6.15(H)  Sodium 135 - 145 mmol/L 131(L) 134(L) 133(L)  Potassium 3.5 - 5.1 mmol/L 5.1 4.0 3.3(L)  Chloride 98 - 111 mmol/L 96(L) 98 98  CO2 22 - 32 mmol/L 26 24 22   Calcium 8.9 - 10.3 mg/dL 7.7(L) 7.4(L) 7.3(L)    No results found.  Disposition Plan & Communication  Patient status: Inpatient  Admitted From:  {From:23814} Planned disposition location: {PLAN; DISPOSITION:26386} Anticipated discharge date: *** pending ***  Family Communication: ***    Author: Richarda Osmond, DO [facility] 11/02/2021, 7:25 AM   Available by Epic secure chat 7AM-7PM. If 7PM-7AM, please contact night-coverage.  [facility] contact information found on [email or web address].

## 2021-11-02 NOTE — Progress Notes (Signed)
SLP Cancellation Note ? ?Patient Details ?Name: Catherine Burke ?MRN: [id number] ?DOB: 08-Apr-1941 ? ? ?Cancelled treatment:       Reason Eval/Treat Not Completed: Patient at procedure or test/unavailable (HD) ?Pt is off floor at HD. Will continue to follow for toleration of oral diet per current POC.  ?  ? ? ? ?Orinda Kenner, MS, CCC-SLP ?Speech Language Pathologist ?Rehab Services; [place] ?[phone] (ascom) ?[name] ?11/02/2021, 11:04 AM ?

## 2021-11-02 NOTE — Progress Notes (Signed)
[facility]  ROUNDING NOTE   Subjective:   [name] was admitted to [facility] on 10/21/2021 for Facial swelling [R22.0] Angioedema, initial encounter [T78.3XXA] Fever, unspecified fever cause [R50.9] Angioedema [T78.3XXA]  Patient is known to our clinic and receives PD management at [facility], supervised by Dr Juleen China.    Patient seen and evaluated during dialysis   HEMODIALYSIS FLOWSHEET:  Blood Flow Rate (mL/min): 400 mL/min Arterial Pressure (mmHg): -160 mmHg Venous Pressure (mmHg): 130 mmHg Transmembrane Pressure (mmHg): 60 mmHg Ultrafiltration Rate (mL/min): 620 mL/min Dialysate Flow Rate (mL/min): 500 ml/min Conductivity: Machine : 13.8 Conductivity: Machine : 13.8 Dialysis Fluid Bolus: Normal Saline Bolus Amount (mL): 200 mL (gave 200 ml of normal saline due to bp dropping) Dialysate Change:  (3.0k)  Patient resting quietly and comfortably during treatment   Objective:  Vital signs in last 24 hours:  Temp:  [98.1 F (36.7 C)-100 F (37.8 C)] 100 F (37.8 C) (03/01 0924) Pulse Rate:  [72-99] 98 (03/01 1145) Resp:  [15-22] 16 (03/01 1145) BP: (67-144)/(20-73) 67/48 (03/01 1145) SpO2:  [98 %-100 %] 99 % (03/01 1145) Weight:  [58.5 kg-60.7 kg] 58.5 kg (03/01 0924)  Weight change: -2 kg Filed Weights   11/02/21 0500 11/02/21 0912 11/02/21 0924  Weight: 60.7 kg 58.5 kg 58.5 kg    Intake/Output: I/O last 3 completed shifts: In: 632.5 [NG/GT:632.5] Out: -    Intake/Output this shift:  No intake/output data recorded.  Physical Exam: General: NAD  Head: dry oral mucosa, left lateral tongue   Eyes: Anicteric  Lungs:  Clear to auscultation, normal effort  Heart: Regular rate and rhythm  Abdomen:  Soft, nontender  Extremities:  2+ Dependent peripheral edema.  Neurologic: Alert and oriented  Skin: No lesions, generalized ecchymosis   Access: PD catheter, Rt IJ Permcath placed on 10/23/2021 by Dr Lucky Cowboy    Basic Metabolic  Panel: Recent Labs  Lab 10/27/21 0437 10/12/2021 0509 10/24/2021 1108 11/01/2021 1649 10/29/21 0503 10/29/21 1642 10/31/21 1300  NA 133* 133*  --   --  134*  --  131*  K 3.4* 3.3*  --   --  4.0  --  5.1  CL 99 98  --   --  98  --  96*  CO2 23 22  --   --  24  --  26  GLUCOSE 291* 222*  --   --  221*  --  209*  BUN 84* 84*  --   --  78*  --  64*  CREATININE 5.85* 6.15*  --   --  5.71*  --  4.50*  CALCIUM 7.6* 7.3*  --   --  7.4*  --  7.7*  MG 1.7 1.7 1.7 1.7 1.8 1.7  --   PHOS  --   --  6.3* 5.7* 5.7* 3.9 4.8*     Liver Function Tests: Recent Labs  Lab 10/31/21 1300  ALBUMIN 1.8*     No results for input(s): LIPASE, AMYLASE in the last 168 hours. No results for input(s): AMMONIA in the last 168 hours.  CBC: Recent Labs  Lab 10/29/21 0503 10/30/21 0450 10/31/21 0358 11/01/21 0455 11/02/21 0553  WBC 20.1* 15.1* 12.5* 10.4 7.9  HGB 7.1* 6.7* 7.8* 8.1* 7.8*  HCT 22.5* 21.3* 24.1* 25.2* 24.8*  MCV 87.2 88.8 88.0 89.0 90.8  PLT 182 127* 82* 69* 63*     Cardiac Enzymes: No results for input(s): CKTOTAL, CKMB, CKMBINDEX, TROPONINI in the last 168 hours.  BNP:  Invalid input(s): POCBNP  CBG: Recent Labs  Lab 11/01/21 1723 11/01/21 2027 11/02/21 0049 11/02/21 0354 11/02/21 0754  GLUCAP 220* 236* 162* 199* 259*     Microbiology: Results for orders placed or performed during the hospital encounter of 11/01/2021  Resp Panel by RT-PCR (Flu A&B, Covid) Nasopharyngeal Swab     Status: None   Collection Time: 10/20/2021  5:18 PM   Specimen: Nasopharyngeal Swab; Nasopharyngeal(NP) swabs in vial transport medium  Result Value Ref Range Status   SARS Coronavirus 2 by RT PCR NEGATIVE NEGATIVE Final    Comment: (NOTE) SARS-CoV-2 target nucleic acids are NOT DETECTED.  The SARS-CoV-2 RNA is generally detectable in upper respiratory specimens during the acute phase of infection. The lowest concentration of SARS-CoV-2 viral copies this assay can detect is 138 copies/mL. A  negative result does not preclude SARS-Cov-2 infection and should not be used as the sole basis for treatment or other patient management decisions. A negative result may occur with  improper specimen collection/handling, submission of specimen other than nasopharyngeal swab, presence of viral mutation(s) within the areas targeted by this assay, and inadequate number of viral copies(<138 copies/mL). A negative result must be combined with clinical observations, patient history, and epidemiological information. The expected result is Negative.  Fact Sheet for Patients:  EntrepreneurPulse.com.au  Fact Sheet for Healthcare Providers:  IncredibleEmployment.be  This test is no t yet approved or cleared by the [place] FDA and  has been authorized for detection and/or diagnosis of SARS-CoV-2 by FDA under an Emergency Use Authorization (EUA). This EUA will remain  in effect (meaning this test can be used) for the duration of the COVID-19 declaration under Section 564(b)(1) of the Act, 21 U.S.C.section 360bbb-3(b)(1), unless the authorization is terminated  or revoked sooner.       Influenza A by PCR NEGATIVE NEGATIVE Final   Influenza B by PCR NEGATIVE NEGATIVE Final    Comment: (NOTE) The Xpert Xpress SARS-CoV-2/FLU/RSV plus assay is intended as an aid in the diagnosis of influenza from Nasopharyngeal swab specimens and should not be used as a sole basis for treatment. Nasal washings and aspirates are unacceptable for Xpert Xpress SARS-CoV-2/FLU/RSV testing.  Fact Sheet for Patients: EntrepreneurPulse.com.au  Fact Sheet for Healthcare Providers: IncredibleEmployment.be  This test is not yet approved or cleared by the [place] FDA and has been authorized for detection and/or diagnosis of SARS-CoV-2 by FDA under an Emergency Use Authorization (EUA). This EUA will remain in effect (meaning this test can  be used) for the duration of the COVID-19 declaration under Section 564(b)(1) of the Act, 21 U.S.C. section 360bbb-3(b)(1), unless the authorization is terminated or revoked.  Performed at [facility], [address] [place], [place] [address]   Body fluid culture w Gram Stain     Status: None   Collection Time: 10/31/2021 11:40 PM   Specimen: Peritoneal Washings; Body Fluid  Result Value Ref Range Status   Specimen Description   Final    PERITONEAL DIALYSIS Performed at [facility], [address], [place], [place] [address]    Special Requests   Final    NONE Performed at [facility], [address] [place], [place] [address]    Gram Stain   Final    NO SQUAMOUS EPITHELIAL CELLS SEEN NO WBC SEEN NO ORGANISMS SEEN    Culture   Final    NO GROWTH 3 DAYS Performed at [facility], [place] [address], [place], [place] [address]  Report Status 10/20/2021 FINAL  Final  MRSA Next Gen by PCR, Nasal     Status: None   Collection Time: 10/18/21  1:40 PM   Specimen: Nasal Mucosa; Nasal Swab  Result Value Ref Range Status   MRSA by PCR Next Gen NOT DETECTED NOT DETECTED Final    Comment: (NOTE) The GeneXpert MRSA Assay (FDA approved for NASAL specimens only), is one component of a comprehensive MRSA colonization surveillance program. It is not intended to diagnose MRSA infection nor to guide or monitor treatment for MRSA infections. Test performance is not FDA approved in patients less than 62 years old. Performed at [facility], [address] [place], [place] [address]     Coagulation Studies: Recent Labs    10/31/21 0745  LABPROT 13.0  INR 1.0      Urinalysis: No results for input(s): COLORURINE, LABSPEC, PHURINE, GLUCOSEU, HGBUR, BILIRUBINUR, KETONESUR, PROTEINUR, UROBILINOGEN, NITRITE, LEUKOCYTESUR in the last 72 hours.  Invalid input(s): APPERANCEUR    Imaging: No results found.   Medications:    dialysis  solution 1.5% low-MG/low-CA Stopped (10/05/2021 1035)   feeding supplement (OSMOLITE 1.2 CAL) 1,000 mL (10/30/21 0511)    chlorhexidine  15 mL Mouth/Throat QID   Chlorhexidine Gluconate Cloth  6 each Topical Daily   collagenase   Topical Daily   darbepoetin (ARANESP) injection - NON-DIALYSIS  100 mcg Subcutaneous Q Thu-1800   dexamethasone (DECADRON) injection  4 mg Intravenous Q24H   feeding supplement (NEPRO CARB STEADY)  237 mL Oral TID   free water  80 mL Per Tube Q6H   gentamicin cream  1 application Topical Daily   heparin sodium (porcine)       insulin aspart  0-15 Units Subcutaneous Q4H   insulin glargine-yfgn  5 Units Subcutaneous Daily   isosorbide mononitrate  30 mg Oral Daily   magic mouthwash w/lidocaine  10 mL Oral QID   multivitamin  1 tablet Per Tube QHS   pantoprazole sodium  40 mg Per Tube Daily   acetaminophen **OR** acetaminophen, benzocaine, ondansetron **OR** ondansetron (ZOFRAN) IV  Assessment/ Plan:  [name] is a 81 y.o. white female with end stage renal disease on peritoneal dialysis, diabetes mellitus type II insulin dependent, hypertension, and hyperlipidemia who is admitted to [facility] on 10/13/2021 for Facial swelling [R22.0] Angioedema, initial encounter [T78.3XXA] Fever, unspecified fever cause [R50.9] Angioedema [T78.3XXA]  [facility] 59kg CCPD 5 exchanges 2 liter fills 9 hours  End Stage Renal Disease:  Patient currently receiving dialysis, UF goal 2 L as tolerated.  Patient experiencing decreased blood pressures resulting in UF being turned off.  We will decrease UF goal.  Due to continued hypotension, UF will remain off for remainder of treatment.  Will order albumin for next dialysis treatment.  Next treatment scheduled for Friday.   02/28 0701 - 03/01 0700 In: 632.5 [NG/GT:632.5] Out: -    Hypertension:   ACE inhibitor and ARB initially held due to angioedema.  Patient currently receiving clonidine,  hydralazine,  metoprolol and Isosorbide. held prior to dialysis.  Blood pressure 89/31 during dialysis  Anemia of chronic kidney disease:  Mircera as outpatient given 09/27/2021.    Lab Results  Component Value Date   HGB 7.8 (L) 11/02/2021  Aranesp ordered weekly (Thursday) Hgb not at desired target Patient may need PRBC as hemoglobin is less than 7   4.  Angioedema-believed due to prescribed ACE-I.   Transferred our of ICU on 10/20/21. ENT following  Continues receiving dexamethasone daily  Oral status improving and working with speech therapy to assist with swallowing. Patient currently refusing PEG placement, but unable to maintain adequate nutritional status independently. Uninterested in transitioning to Hospice at this time. Will continue to follow     LOS: [place] 3/1/202312:02 PM

## 2021-11-02 NOTE — Progress Notes (Signed)
PT Cancellation Note ? ?Patient Details ?Name: Catherine Burke ?MRN: [id number] ?DOB: 04-08-1941 ? ? ?Cancelled Treatment:     PT attempt. Pt extremely lethargic and currently not appropriate. Did have PD earlier. Per RN," she has been sound asleep since." PT will return tomorrow and continue to follow per current POC.  ? ? ?Willette Pa ?11/02/2021, 2:52 PM ?

## 2021-11-02 DEATH — deceased

## 2021-11-03 ENCOUNTER — Inpatient Hospital Stay: Payer: Medicare PPO

## 2021-11-03 DIAGNOSIS — R0902 Hypoxemia: Secondary | ICD-10-CM | POA: Diagnosis not present

## 2021-11-03 DIAGNOSIS — T783XXA Angioneurotic edema, initial encounter: Secondary | ICD-10-CM | POA: Diagnosis not present

## 2021-11-03 DIAGNOSIS — N186 End stage renal disease: Secondary | ICD-10-CM | POA: Diagnosis not present

## 2021-11-03 DIAGNOSIS — N184 Chronic kidney disease, stage 4 (severe): Secondary | ICD-10-CM | POA: Diagnosis not present

## 2021-11-03 LAB — CBC
HCT: 22.4 % — ABNORMAL LOW (ref 36.0–46.0)
Hemoglobin: 7 g/dL — ABNORMAL LOW (ref 12.0–15.0)
MCH: 28.6 pg (ref 26.0–34.0)
MCHC: 31.3 g/dL (ref 30.0–36.0)
MCV: 91.4 fL (ref 80.0–100.0)
Platelets: 52 10*3/uL — ABNORMAL LOW (ref 150–400)
RBC: 2.45 MIL/uL — ABNORMAL LOW (ref 3.87–5.11)
RDW: 17.4 % — ABNORMAL HIGH (ref 11.5–15.5)
WBC: 5 10*3/uL (ref 4.0–10.5)
nRBC: 0 % (ref 0.0–0.2)

## 2021-11-03 LAB — COMPREHENSIVE METABOLIC PANEL
ALT: 25 U/L (ref 0–44)
AST: 26 U/L (ref 15–41)
Albumin: 1.6 g/dL — ABNORMAL LOW (ref 3.5–5.0)
Alkaline Phosphatase: 66 U/L (ref 38–126)
Anion gap: 8 (ref 5–15)
BUN: 42 mg/dL — ABNORMAL HIGH (ref 8–23)
CO2: 28 mmol/L (ref 22–32)
Calcium: 7.5 mg/dL — ABNORMAL LOW (ref 8.9–10.3)
Chloride: 97 mmol/L — ABNORMAL LOW (ref 98–111)
Creatinine, Ser: 2.75 mg/dL — ABNORMAL HIGH (ref 0.44–1.00)
GFR, Estimated: 17 mL/min — ABNORMAL LOW (ref >60)
Glucose, Bld: 231 mg/dL — ABNORMAL HIGH (ref 70–99)
Potassium: 5.5 mmol/L — ABNORMAL HIGH (ref 3.5–5.1)
Sodium: 133 mmol/L — ABNORMAL LOW (ref 135–145)
Total Bilirubin: 0.8 mg/dL (ref 0.3–1.2)
Total Protein: 4.2 g/dL — ABNORMAL LOW (ref 6.5–8.1)

## 2021-11-03 LAB — GLUCOSE, CAPILLARY
Glucose-Capillary: 227 mg/dL — ABNORMAL HIGH (ref 70–99)
Glucose-Capillary: 220 mg/dL — ABNORMAL HIGH (ref 70–99)
Glucose-Capillary: 221 mg/dL — ABNORMAL HIGH (ref 70–99)
Glucose-Capillary: 163 mg/dL — ABNORMAL HIGH (ref 70–99)

## 2021-11-03 LAB — BLOOD GAS, ARTERIAL
Acid-Base Excess: 3.9 mmol/L — ABNORMAL HIGH (ref 0.0–2.0)
Bicarbonate: 28.5 mmol/L — ABNORMAL HIGH (ref 20.0–28.0)
FIO2: 40 %
MECHVT: 440 mL
O2 Saturation: 100 %
PEEP: 5 cmH2O
Patient temperature: 37
RATE: 16 resp/min
pCO2 arterial: 42 mmHg (ref 32–48)
pH, Arterial: 7.44 (ref 7.35–7.45)
pO2, Arterial: 164 mmHg — ABNORMAL HIGH (ref 83–108)

## 2021-11-03 SURGERY — EMBOLIZATION
Anesthesia: Moderate Sedation

## 2021-11-03 MED ORDER — DOCUSATE SODIUM 50 MG/5ML PO LIQD: 100.0000 mg | Freq: Two times a day (BID) | ORAL | Status: DC

## 2021-11-03 MED ORDER — SODIUM CHLORIDE 0.9% IV SOLUTION: Freq: Once | INTRAVENOUS | Status: DC

## 2021-11-03 MED ORDER — MIDAZOLAM HCL 2 MG/2ML IJ SOLN: 4.0000 mg | Freq: Once | INTRAMUSCULAR | Status: AC

## 2021-11-03 MED ORDER — POLYVINYL ALCOHOL 1.4 % OP SOLN: 1.0000 [drp] | Freq: Four times a day (QID) | OPHTHALMIC | Status: DC | PRN

## 2021-11-03 MED ORDER — ACETAMINOPHEN 650 MG RE SUPP: 650.0000 mg | Freq: Four times a day (QID) | RECTAL | Status: DC | PRN

## 2021-11-03 MED ORDER — FENTANYL 2500MCG IN NS 250ML (10MCG/ML) PREMIX INFUSION
0.0000 ug/h | INTRAVENOUS | Status: DC
  Administered 2021-11-03: 100 ug/h via INTRAVENOUS
  Administered 2021-11-03: 400 ug/h via INTRAVENOUS
  Filled 2021-11-03: qty 250

## 2021-11-03 MED ORDER — VECURONIUM BROMIDE 10 MG IV SOLR: 10.0000 mg | Freq: Once | INTRAVENOUS | Status: AC

## 2021-11-03 MED ORDER — POLYETHYLENE GLYCOL 3350 17 G PO PACK: 17.0000 g | PACK | Freq: Every day | ORAL | Status: DC

## 2021-11-03 MED ORDER — VECURONIUM BROMIDE 10 MG IV SOLR
INTRAVENOUS | Status: AC
  Administered 2021-11-03: 10 mg via INTRAVENOUS
  Filled 2021-11-03: qty 20

## 2021-11-03 MED ORDER — INSULIN GLARGINE-YFGN 100 UNIT/ML ~~LOC~~ SOLN
15.0000 [IU] | Freq: Every day | SUBCUTANEOUS | Status: DC
  Filled 2021-11-03: qty 0.15

## 2021-11-03 MED ORDER — NOREPINEPHRINE 4 MG/250ML-% IV SOLN
INTRAVENOUS | Status: AC
  Filled 2021-11-03: qty 250

## 2021-11-03 MED ORDER — FENTANYL 2500MCG IN NS 250ML (10MCG/ML) PREMIX INFUSION
INTRAVENOUS | Status: AC
  Filled 2021-11-03: qty 250

## 2021-11-03 MED ORDER — MIDAZOLAM HCL 2 MG/2ML IJ SOLN
INTRAMUSCULAR | Status: AC
  Administered 2021-11-03: 4 mg via INTRAVENOUS
  Filled 2021-11-03: qty 4

## 2021-11-03 MED ORDER — GLYCOPYRROLATE 0.2 MG/ML IJ SOLN: 0.2000 mg | INTRAMUSCULAR | Status: DC | PRN

## 2021-11-03 MED ORDER — DIPHENHYDRAMINE HCL 50 MG/ML IJ SOLN: 25.0000 mg | INTRAMUSCULAR | Status: DC | PRN

## 2021-11-03 MED ORDER — DEXTROSE 5 % IV SOLN: INTRAVENOUS | Status: DC

## 2021-11-03 MED ORDER — GLYCOPYRROLATE 1 MG PO TABS
1.0000 mg | ORAL_TABLET | ORAL | Status: DC | PRN
  Filled 2021-11-03: qty 1

## 2021-11-03 MED ORDER — MIDAZOLAM HCL 2 MG/2ML IJ SOLN: 2.0000 mg | INTRAMUSCULAR | Status: DC | PRN

## 2021-11-03 MED ORDER — ACETAMINOPHEN 325 MG PO TABS: 650.0000 mg | ORAL_TABLET | Freq: Four times a day (QID) | ORAL | Status: DC | PRN

## 2021-11-03 MED ORDER — FENTANYL CITRATE (PF) 100 MCG/2ML IJ SOLN: 100.0000 ug | Freq: Once | INTRAMUSCULAR | Status: AC

## 2021-11-03 MED ORDER — MIDAZOLAM HCL 2 MG/2ML IJ SOLN: 1.0000 mg | INTRAMUSCULAR | Status: DC | PRN

## 2021-11-03 MED ORDER — MIDAZOLAM HCL 2 MG/2ML IJ SOLN
1.0000 mg | INTRAMUSCULAR | Status: DC | PRN
  Administered 2021-11-03: 1 mg via INTRAVENOUS
  Filled 2021-11-03: qty 2

## 2021-11-03 MED ORDER — MORPHINE SULFATE (PF) 2 MG/ML IV SOLN: 2.0000 mg | INTRAVENOUS | Status: DC | PRN

## 2021-11-03 MED ORDER — FENTANYL CITRATE (PF) 100 MCG/2ML IJ SOLN
INTRAMUSCULAR | Status: AC
  Administered 2021-11-03: 100 ug via INTRAVENOUS
  Filled 2021-11-03: qty 2

## 2021-11-05 LAB — PREPARE FRESH FROZEN PLASMA

## 2021-11-05 LAB — BPAM FFP
Blood Product Expiration Date: 202303072359
Unit Type and Rh: 6200

## 2021-11-14 ENCOUNTER — Inpatient Hospital Stay (HOSPITAL_COMMUNITY): Payer: Medicare PPO

## 2021-11-21 ENCOUNTER — Ambulatory Visit (HOSPITAL_COMMUNITY): Payer: Medicare PPO

## 2021-11-21 ENCOUNTER — Ambulatory Visit (HOSPITAL_COMMUNITY): Payer: Medicare PPO | Admitting: Physician Assistant

## 2021-12-03 NOTE — Progress Notes (Signed)
?                                                   ?Palliative Care Progress Note, Assessment & Plan  ? ?Patient Name: Catherine Burke       Date: Nov 09, 2021 ?DOB: Dec 15, 1940  Age: 81 y.o. MRN#: [id number] ?Attending Physician: Flora Lipps, MD ?Primary Care Physician: Idelle Crouch, MD ?Admit Date: 10/19/2021 ? ?Reason for Consultation/Follow-up: Establishing goals of care ? ?Subjective: ?Patient is lying in bed sedated and mechanically ventilated.  Patient's grandchildren are at bedside. ? ?HPI: ?81 y.o. female  with a PMH of ESRD on PD, HTN, and IDDM (A1C 5.7) admitted on 10/07/2021 with facial swelling.  Facial swelling thought to be due to angioedema secondary to ACE inhibitor use. ACE inhibitor's were discontinued and added to allergy list.  ?  ?Hospitalization has been prolonged due to slow progress back to oral intake and tongue bleeding.  ?  ?Patient was struggling with PO intake d/t tongue swelling and bleeding and NG tube placed for nutrition.  ?  ?Patient also with dialysis cath placed 2/24 and has since tolerated HD (MWF) without complication. ? ?3/2, pt began bleeding excessively from mouth and having respiratory distress. Transferred to ICU wherein she was intubated. As per Dr. Zoila Shutter note, plan now set for comfort care with plans to stop vent but keep ETT in place.  ? ?Summary of counseling/coordination of care: ?After reviewing the patient's chart and assessing the patient at bedside, I met with the patient's 2 grandchildren at bedside.   ? ?I introduced palliative medicine as a specialized form of medical care for people living with serious illness.  It focuses on providing relief from symptoms and stress of a serious illness.  I shared with family at bedside that I have been following the patient all week. ? ?Therapeutic sounds and active listening provided for  family to share their thoughts and emotions regarding the patient's current health status.  Both grandchildren at bedside shared they just want her to be comfortable and free from pain.  ? ?Discussed that pain medication was minimizing her pain but that she may still be able to hear them.  Encouraged them to speak to her even though she may not respond. ? ?From my discussions with Dr. Mortimer Fries, family is prepared to take patient home with hospice once medically stable. ? ?Palliative medicine team will shadow the patient's chart and be available as needed, at family's request, and if patient's health status declines. ? ?Questions and concerns of family present were addressed. Family was encouraged to call with palliative concerns.  ? ? ?Code Status: ?DNR ? ?Prognosis: ?< 2 weeks ? ?Discharge Planning: ?To Be Determined ? ?Care plan was discussed with patient, patient's grandchildren, Dr. Mortimer Fries ? ?Physical Exam ?Vitals and nursing note reviewed.  ?Constitutional:   ?   General: She is not in acute distress. ?   Appearance: She is not toxic-appearing.  ?HENT:  ?   Head: Normocephalic and atraumatic.  ?   Mouth/Throat:  ?   Comments: Sloughing, dried blood, and wet gauze present ?Cardiovascular:  ?   Rate and Rhythm: Normal rate.  ?Pulmonary:  ?   Comments: MV ?Abdominal:  ?   Palpations: Abdomen is soft.  ?Musculoskeletal:  ?   Comments: sedated  ?Skin: ?   General: Skin is  warm.  ?         ? ?Palliative Assessment/Data: 10% ? ? ? ?Total Time 35 minutes  ?Greater than 50%  of this time was spent counseling and coordinating care related to the above assessment and plan. ? ?Thank you for allowing the Palliative Medicine Team to assist in the care of this patient. ? ?Verdell Carmine. Cortlan Dolin, DNP, FNP-BC ?Palliative Medicine Team ?Team Phone # [phone] ?  ?

## 2021-12-03 NOTE — Death Summary Note (Addendum)
DEATH SUMMARY   Patient Details  Name: Catherine Burke MRN: [id number] DOB: [birth year]  Admission/Discharge Information   Admit Date:  11/22/2021  Date of Death: Date of Death: 2021-11-21  Time of Death: Time of Death: 1949/12/04  Length of Stay: December 06, 2022  Referring Physician: Idelle Crouch, MD   Reason(s) for Hospitalization  Angioedema  Diagnoses  Preliminary cause of death: Acute upper airway obstruction and respiratory failure due to Angioedema Secondary Diagnoses (including complications and co-morbidities):  Principal Problem:   Angioedema, initial encounter Active Problems:   Hyperlipidemia   Essential hypertension   GERD (gastroesophageal reflux disease)   Chronic kidney disease (CKD), stage IV (severe) (HCC)   ESRD (end stage renal disease) on dialysis ([place])   Facial cellulitis   Malnutrition of moderate degree   Angioedema   Thrombocytopenia (HCC)   Anemia in ESRD (end-stage renal disease) ([place])   Atrial fibrillation, new onset [facility])   Brief Hospital Course (including significant findings, care, treatment, and services provided and events leading to death)  Catherine Burke is a 81 y.o. year old female with past medical history of ESRD on PD, Hypertension on Ace Inhibitor who was admitted to hospitalist service on November 03, 2021 with facial edema concerning for cellulitis vs Angioedema. On 2/14, patient had worsening face tongue and lip edema despite treatment with steroids, Benadryl, famotidine and was transferred to the ICU and PCCM consulted due to high risk for intubation. On 2/15, patient was noted to be in afib with RVR and started on Diltiazem gtt and cardiology consulted. Patient was started on Heparin for thromboembolic prophylaxis. During the course of her hospitalization, facial cellulitis was ruled out and abx discontinued. She also had a perm cath placed for HD in anticipation of dc to SNF rehab. Patient continued to have tongue swelling and was noted to have bleeding from  the mouth. ENT was consulted on 2/21 who felt that Tongue bleeding likely related to sloughing and possible trauma following angioedema and recommended continuous ice baths with saline soaked gauze placed on each lateral aspect of her tongue during the day, Peridex mouth rinse to help debride the area and keep the area clean, and soft  diet to prevent any possible chewing on the tissues. On 2/21, Pt was transferred back to the floor after tongue swelling improved.  2/23: Patient still not doing well with p.o. intake or oral medications. Discussed the possibility of image guided NGT placement was discussed with pt but she was hesitant until discussed with family members. NGT was ultimately placed on 2/23 and perm Vas Cath on 2/24.  On 2/28: Patient refused PEG placement, she was also noted with declining plts count and Hematology was consulted who thought it was Likely reactive due to multiple acute on chronic problems, consumption from procedure and bleeding, hemodialysis. On 11-22-22, rapid response was called due to patient bleeding profusely from the mouth.  Patient was transferred to the ICU and ENT was called emergently by ICU regarding significant oral cavity bleeding and need for intubation. Patient was emergently intubated with large clot obstructive view.  Bleeding did slow down after intubation. Goals of care was discussed with patient's family who decided to terminally extubate patient to comfort care. Patient was disconnected from the vent but ETT kept in place to keep the airway patent from blood clots. Patient passed away peacefully at [place] on 11/21/21  Pertinent Labs and Studies  Significant Diagnostic Studies CT ABDOMEN PELVIS WO CONTRAST  Result Date: 10/13/2021 CLINICAL DATA:  Epigastric abdominal  pain. EXAM: CT ABDOMEN AND PELVIS WITHOUT CONTRAST TECHNIQUE: Multidetector CT imaging of the abdomen and pelvis was performed following the standard protocol without IV contrast. RADIATION DOSE REDUCTION:  This exam was performed according to the departmental dose-optimization program which includes automated exposure control, adjustment of the mA and/or kV according to patient size and/or use of iterative reconstruction technique. COMPARISON:  Radiograph of same day. FINDINGS: Lower chest: Small bilateral pleural effusions are noted with minimal adjacent subsegmental atelectasis. Hepatobiliary: Status post cholecystectomy. No biliary dilatation is noted. Slightly nodular hepatic contours are noted suggesting possible hepatic cirrhosis. Pancreas: Unremarkable. No pancreatic ductal dilatation or surrounding inflammatory changes. Spleen: Normal in size without focal abnormality. Adrenals/Urinary Tract: Adrenal glands appear normal. Left renal atrophy is noted. Right renal cysts are noted. No hydronephrosis or renal obstruction is noted. Urinary bladder is unremarkable. 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 Stomach/Bowel: Stomach is within normal limits. Appendix appears normal. No evidence of bowel wall thickening, distention, or inflammatory changes. Vascular/Lymphatic: Aortic atherosclerosis. No enlarged abdominal or pelvic lymph nodes. Reproductive: Status post hysterectomy. No adnexal masses. Other: Peritoneal dialysis catheter is noted with coiled tip seen in the pelvis. Small amount of free fluid is noted in the pelvis. There is also noted moderate pneumoperitoneum in the epigastric region as noted on prior radiograph of same day. This could be related to the presence of the dialysis catheter. Musculoskeletal: No acute or significant osseous findings. IMPRESSION: Moderate pneumoperitoneum is noted in the epigastric region as noted on radiograph of same day. This most likely is due to the presence of peritoneal dialysis catheter. The possibility of rupture of hollow viscus cannot be excluded, although there are no other signs to suggest bowel perforation on this study. These results were called by  telephone at the time of interpretation on 10/13/2021 at 2:24 pm to provider [facility] , who verbally acknowledged these results. 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 Small bilateral pleural effusions with adjacent subsegmental atelectasis. Aortic Atherosclerosis (ICD10-I70.0). Electronically Signed   By: Marijo Conception M.D.   On: 10/13/2021 14:25   DG Chest 2 View  Result Date: 10/13/2021 CLINICAL DATA:  Chest pain beginning yesterday. Shortness of breath. Nausea. EXAM: CHEST - 2 VIEW COMPARISON:  04/25/2021 FINDINGS: Free intraperitoneal air is seen beneath both hemidiaphragms. Mild cardiomegaly stable. Mild scarring is seen in both lung bases. No evidence of acute infiltrate or pulmonary edema. Stable mild thoracic dextroscoliosis. IMPRESSION: Free intraperitoneal air. This may be due to peritoneal dialysis, although perforated viscus cannot be excluded. Recommend clinical correlation, and consider abdomen pelvis CT if warranted. Mild cardiomegaly and bibasilar scarring. No active lung disease. Critical Value/emergent results were called by telephone at the time of interpretation on 10/13/2021 at 12:24 pm to provider [facility] , who verbally acknowledged these results. Electronically Signed   By: Marlaine Hind M.D.   On: 10/13/2021 12:28   CT Soft Tissue Neck Wo Contrast  Result Date: 10/14/2021 CLINICAL DATA:  Right neck/facial swelling, pain, and sore throat. EXAM: CT NECK WITHOUT CONTRAST TECHNIQUE: Multidetector CT imaging of the neck was performed following the standard protocol without intravenous contrast. RADIATION DOSE REDUCTION: This exam was performed according to the departmental dose-optimization program which includes automated exposure control, adjustment of the mA and/or kV according to patient size and/or use of iterative reconstruction technique. COMPARISON:  Chest CTA 10/13/2021. FINDINGS: Pharynx and larynx: Moderate low-density soft tissue  swelling at the level of the hypopharynx bilaterally effacing  the para form sinuses, new from the recent chest CTA. Mild narrowing of the lower oropharyngeal and supraglottic airway. No retropharyngeal fluid collection. No evidence of significant edema involving the epiglottis or floor of mouth. Salivary glands: No inflammation, mass, or stone. Thyroid: Thyroid nodules measuring up to 1.4 cm as seen on the recent chest CTA and for which no follow-up imaging is recommended. Lymph nodes: No enlarged or suspicious lymph nodes in the neck. Vascular: Calcified atherosclerosis at the carotid bifurcations. Limited intracranial: Proximally 2 cm partially calcified mass along the right sphenoid wing, incompletely imaged. Visualized orbits: Bilateral cataract extraction. Mastoids and visualized paranasal sinuses: Mild mucosal thickening/small mucous retention cysts in the left greater than right maxillary sinuses. Trace right mastoid fluid. Skeleton: Moderate cervical disc and facet degeneration. Upper chest: Mild biapical pleuroparenchymal lung scarring. Other: Moderate to prominent right lower facial soft tissue swelling. No drainable fluid collection identified within limitations of noncontrast technique and dental streak artifact. IMPRESSION: 1. Right lower facial soft tissue swelling, possibly cellulitis. No drainable fluid collection identified. 2. Moderate hypopharyngeal swelling, new from 17/00/1749 and of uncertain etiology though possibly infectious or allergic. 3. 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 Consider nonemergent contrast enhanced brain MRI for further evaluation. Electronically Signed   By: Logan Bores M.D.   On: 10/07/2021 17:18   CT Angio Chest Pulmonary Embolism (PE) W or WO Contrast  Result Date: 10/13/2021 CLINICAL DATA:  Generalized chest pain. Shortness of breath. Nausea. EXAM: CT ANGIOGRAPHY CHEST WITH CONTRAST TECHNIQUE: Multidetector CT  imaging of the chest was performed using the standard protocol during bolus administration of intravenous contrast. Multiplanar CT image reconstructions and MIPs were obtained to evaluate the vascular anatomy. RADIATION DOSE REDUCTION: This exam was performed according to the departmental dose-optimization program which includes automated exposure control, adjustment of the mA and/or kV according to patient size and/or use of iterative reconstruction technique. CONTRAST:  48m OMNIPAQUE IOHEXOL 350 MG/ML SOLN COMPARISON:  Chest radiograph and abdominal CT of earlier today. Chest CT of 02/11/2019 FINDINGS: Cardiovascular: The quality of this exam for evaluation of pulmonary embolism is moderate. The bolus is relatively well timed. There is mild motion degradation inferiorly. No pulmonary embolism to the large segmental level. Aortic atherosclerosis. Mild cardiomegaly, without pericardial effusion. Mediastinum/Nodes: Multiple small bilateral thyroid nodules including at up to 1.4 cm. Not clinically significant; no follow-up imaging recommended (ref: J Am Coll Radiol. 2015 Feb;12(2): 143-50) No mediastinal or hilar adenopathy. Small hiatal hernia. Lungs/Pleura: Trace bilateral pleural effusions. Pleural-based right upper and right middle lobe pulmonary nodules of maximally 4 mm are similar and likely subpleural lymph nodes. Mild dependent bibasilar atelectasis. Upper Abdomen: Free intraperitoneal air, as detailed on today's dedicated abdominal CT. Other abdominal findings deferred to that exam. Musculoskeletal: Osteopenia. Review of the MIP images confirms the above findings. IMPRESSION: 1. No pulmonary embolism with limitations above. 2.  No acute process in the chest. 3. Free intraperitoneal air, as on today's abdominal CT. 4. Small bilateral pleural effusions. 5.  Aortic Atherosclerosis (ICD10-I70.0). 6. Small hiatal hernia. Electronically Signed   By: KAbigail MiyamotoM.D.   On: 10/13/2021 20:10   PERIPHERAL VASCULAR  CATHETERIZATION  Result Date: 10/23/2021 See surgical note for result.  [place] Img Upper Uni Right(DVT)  Result Date: 10/22/2021 CLINICAL DATA:  Right arm swelling EXAM: RIGHT UPPER EXTREMITY VENOUS DOPPLER ULTRASOUND TECHNIQUE: Gray-scale sonography with graded compression, as well as color Doppler and duplex ultrasound were performed to evaluate the  upper extremity deep venous system from the level of the subclavian vein and including the jugular, axillary, basilic, radial, ulnar and upper cephalic vein. Spectral Doppler was utilized to evaluate flow at rest and with distal augmentation maneuvers. COMPARISON:  None. FINDINGS: Contralateral Subclavian Vein: Respiratory phasicity is normal and symmetric with the symptomatic side. No evidence of thrombus. Normal compressibility. Internal Jugular Vein: No evidence of thrombus. Normal compressibility, respiratory phasicity and response to augmentation. Subclavian Vein: No evidence of thrombus. Normal compressibility, respiratory phasicity and response to augmentation. Axillary Vein: No evidence of thrombus. Normal compressibility, respiratory phasicity and response to augmentation. Cephalic Vein: No evidence of thrombus. Normal compressibility, respiratory phasicity and response to augmentation. Basilic Vein: No evidence of thrombus. Normal compressibility, respiratory phasicity and response to augmentation. Brachial Veins: No evidence of thrombus. Normal compressibility, respiratory phasicity and response to augmentation. Radial Veins: No evidence of thrombus. Normal compressibility, respiratory phasicity and response to augmentation. Ulnar Veins: No evidence of thrombus. Normal compressibility, respiratory phasicity and response to augmentation. Venous Reflux:  None visualized. Other Findings:  None visualized. IMPRESSION: No evidence of DVT within the right upper extremity. Electronically Signed   By: Inez Catalina M.D.   On: 10/22/2021 03:39   DG Chest  Port 1 View  Result Date: 11-07-21 CLINICAL DATA:  Endotracheal tube placement. Respiratory failure. Central line placement EXAM: PORTABLE CHEST 1 VIEW COMPARISON:  10/19/2021 FINDINGS: Endotracheal tube in the distal trachea approximally 1 cm above the carina Right jugular dual lumen central venous catheter tip in the lower SVC/RA junction. No pneumothorax. NG tube in the stomach with the side hole in the stomach. Interval development of left upper lobe airspace disease likely collapse. Associated effusion is present in the left apex. This was not present previously. Right lung remains clear. Normal vascularity IMPRESSION: Endotracheal tube 1 cm above the carina Central line at the cavoatrial junction on the right. No pneumothorax Interval development of left upper lobe collapse and left apical pleural effusion. Electronically Signed   By: Franchot Gallo M.D.   On: November 07, 2021 10:16   DG CHEST PORT 1 VIEW  Result Date: 10/19/2021 CLINICAL DATA:  New onset atrial fibrillation EXAM: PORTABLE CHEST 1 VIEW COMPARISON:  August 17, 2022 FINDINGS: The heart size and mediastinal contours are stable. Aortic atherosclerosis. Pulmonary vascular congestion. No overt pulmonary edema or focal airspace consolidation. No visible pleural effusion or pneumothorax. The visualized skeletal structures are unchanged. IMPRESSION: Pulmonary vascular congestion without overt pulmonary edema or focal airspace consolidation. Electronically Signed   By: Dahlia Bailiff M.D.   On: 10/19/2021 08:06   DG Chest Port 1 View  Result Date: 10/18/2021 CLINICAL DATA:  Hypoxia. EXAM: PORTABLE CHEST 1 VIEW COMPARISON:  10/30/2021 FINDINGS: Stable cardiomediastinal contours. Aortic atherosclerosis noted. Pulmonary vascular congestion. No pleural effusion or airspace consolidation. IMPRESSION: Pulmonary vascular congestion. Electronically Signed   By: Kerby Moors M.D.   On: 10/18/2021 09:26   DG Chest Port 1 View  Result Date:  10/31/2021 CLINICAL DATA:  Possible sepsis. EXAM: PORTABLE CHEST 1 VIEW COMPARISON:  10/15/2021 FINDINGS: Artifact overlies the chest. Heart size is normal. There is aortic atherosclerosis. The lungs are clear. No heart failure. No acute finding. IMPRESSION: No active disease. Electronically Signed   By: Nelson Chimes M.D.   On: 10/15/2021 16:57   DG Chest Port 1 View  Result Date: 10/15/2021 CLINICAL DATA:  Fever.  Possible sepsis. EXAM: PORTABLE CHEST 1 VIEW COMPARISON:  Chest radiograph dated 10/13/2021. FINDINGS: Minimal left lung base atelectasis similar  or improved since the prior radiograph. Trace left pleural effusion. No new consolidation. No pneumothorax. The cardiac silhouette is within limits. Atherosclerotic calcification of the aorta. Osteopenia with degenerative changes of the spine. No acute osseous pathology. Minimal pneumoperitoneum along the right hemidiaphragm, decreased since the prior study. IMPRESSION: 1. Minimal left lung base atelectasis similar or improved since the prior radiograph. Trace left pleural effusion. 2. Minimal residual pneumoperitoneum under the right hemidiaphragm. Electronically Signed   By: Anner Crete M.D.   On: 10/15/2021 22:18   DG ABD ACUTE 2+V W 1V CHEST  Result Date: 10/14/2021 CLINICAL DATA:  Abdominal pain EXAM: DG ABDOMEN ACUTE WITH 1 VIEW CHEST COMPARISON:  10/13/2021 CT abdomen/pelvis FINDINGS: Stable cardiomediastinal silhouette with normal heart size. No pneumothorax. No pleural effusion. No overt pulmonary edema. Mild smooth is lateral basilar right pleural thickening, unchanged. Streaky linear mild bibasilar atelectasis is similar. No acute consolidative airspace disease. Free air under the right hemidiaphragm is slightly decreased. Previously visualized free air under the left hemidiaphragm has resolved. Peritoneal dialysis catheter terminates over the deep pelvis to the right of midline. No disproportionately dilated small bowel loops. Moderate  gas and mild stool throughout the large bowel, most prominent in the sigmoid colon. Cholecystectomy clips are seen in the right upper quadrant of the abdomen. No radiopaque nephrolithiasis. IMPRESSION: 1. Decreased free air under the right hemidiaphragm, as noted on imaging from 1 day prior, favored to be due to peritoneal dialysis catheter. 2. Nonobstructive bowel gas pattern. 3. Moderate gas and mild stool throughout the large bowel, most prominent in the sigmoid colon. 4. Mild bibasilar atelectasis. No acute cardiopulmonary disease. Electronically Signed   By: Ilona Sorrel M.D.   On: 10/14/2021 08:33   DG [place] Dys Tube Plc W/Fl W/Rad  Result Date: 10/27/2021 CLINICAL DATA:  Poor nutrition EXAM: NASO G TUBE PLACEMENT WITH FL AND WITH RAD FLUOROSCOPY: Fluoroscopy Time:  <0.1 minute Radiation Exposure Index (if provided by the fluoroscopic device): 0.2 mGy Number of Acquired Spot Images: 0 COMPARISON:  None. FINDINGS: 58 French nasogastric tube was inserted through the right nostril and advanced into the stomach past a hiatal hernia without complication. Patient tolerated the procedure well. No immediate complications. IMPRESSION: Successful placement of 14 French nasogastric tube. Electronically Signed   By: Kathreen Devoid M.D.   On: 10/27/2021 11:48   ABORTED INVASIVE LAB PROCEDURE  Result Date: 10/15/2021 See surgical note for result.   Microbiology No results found for this or any previous visit (from the past 240 hour(s)).  Lab Basic Metabolic Panel: Recent Labs  Lab 10/31/21 1300 11/29/21 0515  NA 131* 133*  K 5.1 5.5*  CL 96* 97*  CO2 26 28  GLUCOSE 209* 231*  BUN 64* 42*  CREATININE 4.50* 2.75*  CALCIUM 7.7* 7.5*  PHOS 4.8*  --    Liver Function Tests: Recent Labs  Lab 10/31/21 1300 November 29, 2021 0515  AST  --  26  ALT  --  25  ALKPHOS  --  66  BILITOT  --  0.8  PROT  --  4.2*  ALBUMIN 1.8* 1.6*   No results for input(s): LIPASE, AMYLASE in the last 168 hours. No results for  input(s): AMMONIA in the last 168 hours. CBC: Recent Labs  Lab 10/31/21 0358 11/01/21 0455 11/02/21 0553 November 29, 2021 0515  WBC 12.5* 10.4 7.9 5.0  HGB 7.8* 8.1* 7.8* 7.0*  HCT 24.1* 25.2* 24.8* 22.4*  MCV 88.0 89.0 90.8 91.4  PLT 82* 69* 63* 52*   Cardiac  Enzymes: No results for input(s): CKTOTAL, CKMB, CKMBINDEX, TROPONINI in the last 168 hours. Sepsis Labs: Recent Labs  Lab 10/31/21 0358 11/01/21 0455 11/02/21 0553 2021-11-24 0515  WBC 12.5* 10.4 7.9 5.0    Procedures/Operations  2/24: Vas Cath 3/2: Intubation    Critical Care time: 70 Minutes    Rufina Falco, DNP, CCRN, FNP-C, AGACNP-BC Acute Care Nurse Practitioner  [facility] Pager: [phone] [place] at [facility]

## 2021-12-03 NOTE — Progress Notes (Signed)
..2021-11-09 ?11:39 AM ? ?Burke, Catherine ?[id number] ? ? Temp:  [98 ?F (36.7 ?C)-99 ?F (37.2 ?C)] 98.3 ?F (36.8 ?C) (03/02 1000) ?Pulse Rate:  [80-115] 102 (03/02 1015) ?Resp:  [15-22] 16 (03/02 1015) ?BP: (67-174)/(31-70) 126/70 (03/02 1015) ?SpO2:  [84 %-100 %] 100 % (03/02 1015) ?FiO2 (%):  [60 %-100 %] 60 % (03/02 1015) ?Weight:  [57.1 kg] 57.1 kg (03/01 1227),  ? ? ? ?Intake/Output Summary (Last 24 hours) at November 09, 2021 1139 ?Last data filed at 11/09/2021 1015 ?Gross per 24 hour  ?Intake 497.25 ml  ?Output 1488 ml  ?Net -990.75 ml  ? ? ?Results for orders placed or performed during the hospital encounter of 10/31/2021 (from the past 24 hour(s))  ?Glucose, capillary     Status: Abnormal  ? Collection Time: 11/02/21  4:03 PM  ?Result Value Ref Range  ? Glucose-Capillary 213 (H) 70 - 99 mg/dL  ?Glucose, capillary     Status: Abnormal  ? Collection Time: 11/02/21  8:54 PM  ?Result Value Ref Range  ? Glucose-Capillary 255 (H) 70 - 99 mg/dL  ?Glucose, capillary     Status: Abnormal  ? Collection Time: 11/02/21 11:57 PM  ?Result Value Ref Range  ? Glucose-Capillary 253 (H) 70 - 99 mg/dL  ?CBC     Status: Abnormal  ? Collection Time: November 09, 2021  5:15 AM  ?Result Value Ref Range  ? WBC 5.0 4.0 - 10.5 K/uL  ? RBC 2.45 (L) 3.87 - 5.11 MIL/uL  ? Hemoglobin 7.0 (L) 12.0 - 15.0 g/dL  ? HCT 22.4 (L) 36.0 - 46.0 %  ? MCV 91.4 80.0 - 100.0 fL  ? MCH 28.6 26.0 - 34.0 pg  ? MCHC 31.3 30.0 - 36.0 g/dL  ? RDW 17.4 (H) 11.5 - 15.5 %  ? Platelets 52 (L) 150 - 400 K/uL  ? nRBC 0.0 0.0 - 0.2 %  ?Comprehensive metabolic panel     Status: Abnormal  ? Collection Time: 11-09-21  5:15 AM  ?Result Value Ref Range  ? Sodium 133 (L) 135 - 145 mmol/L  ? Potassium 5.5 (H) 3.5 - 5.1 mmol/L  ? Chloride 97 (L) 98 - 111 mmol/L  ? CO2 28 22 - 32 mmol/L  ? Glucose, Bld 231 (H) 70 - 99 mg/dL  ? BUN 42 (H) 8 - 23 mg/dL  ? Creatinine, Ser 2.75 (H) 0.44 - 1.00 mg/dL  ? Calcium 7.5 (L) 8.9 - 10.3 mg/dL  ? Total Protein 4.2 (L) 6.5 - 8.1 g/dL  ? Albumin 1.6 (L) 3.5 -  5.0 g/dL  ? AST 26 15 - 41 U/L  ? ALT 25 0 - 44 U/L  ? Alkaline Phosphatase 66 38 - 126 U/L  ? Total Bilirubin 0.8 0.3 - 1.2 mg/dL  ? GFR, Estimated 17 (L) >60 mL/min  ? Anion gap 8 5 - 15  ?Glucose, capillary     Status: Abnormal  ? Collection Time: 11-09-21  5:47 AM  ?Result Value Ref Range  ? Glucose-Capillary 227 (H) 70 - 99 mg/dL  ?Glucose, capillary     Status: Abnormal  ? Collection Time: 11-09-21  8:00 AM  ?Result Value Ref Range  ? Glucose-Capillary 220 (H) 70 - 99 mg/dL  ?Prepare fresh frozen plasma     Status: None (Preliminary result)  ? Collection Time: Nov 09, 2021  8:48 AM  ?Result Value Ref Range  ? Unit Number [id number]   ? Blood Component Type THW PLS APHR   ? Unit division B0   ? Status of  Unit ALLOCATED   ? Transfusion Status    ?  OK TO TRANSFUSE ?Performed at [facility], [address], [place], [place] [address] ?  ?Glucose, capillary     Status: Abnormal  ? Collection Time: 2021-11-19  9:47 AM  ?Result Value Ref Range  ? Glucose-Capillary 221 (H) 70 - 99 mg/dL  ? ? ?SUBJECTIVE:  Called emergently by ICU regarding significant oral cavity bleeding and need for intubation.  Per Dr. Mortimer Fries, significant pouring of blood out of mouth with inability to protect airway.  Emergently intubated with large clot obstructive view.  Bleeding did slow down after intubation.  Prior to onset of bleeding, patient was NGT dependent due to tongue swelling.  This had improved over time.  Previously evaluated by Dr. Tami Ribas and debridement of necrotic tissue performed and recommended conservative oral care.  No known trauma to oral cavity preceeding event. ? ?OBJECTIVE:  GEN- sedated female supine in bed ?OC/OP-  large clot filling entire oral cavity and oropharynx.  ETT secure and inplace.  Dried blood on upper and lower lips bilaterally.  No current active bleeding from oral cavity. ?NOSE- NGT in place and secure. ? ?Procedure-  Cauterization of oral cavity bleeding-  Using forceps a large clot was removed  from the patient's oral cavity that extended into the oropharynx.  This demonstrated diffuse ooze from lateral tongue right > left and ulcerations of lateral tongue bilaterally.  There were also ulcerations of lateral buccal mucosa adjacent to the retro-molar trigone with mild diffuse oozing.  All areas of oozing and ulceration were cauterized with Silver Nitrate bilaterally.  This resulted in stopping of diffuse ooze that was present.  At this time, a kerlex roll was soaked in sterile saline and placed into the patient's oral cavity with gentle pressure on the bilateral tongue ulcers and the buccal mucosa ulcerations. ? ?IMPRESSION:  Severe oral cavity bleeding with 4 areas of ulceration but no obvious source and thrombocytopenia ? ?PLAN:  Unclear if today's episode was due to diffuse bleeding from all of these areas due to thrombocytopenia or if she traumatized her tongue due to bite that was severe due to thrombocytopenia or potentially a vessel has eroded that bleed and has since stopped.  Will plan on removal of packing later on today and most likely replace if needed.  Discussed with vascular surgery and consider angiography with selective emolization if area of blushing seen.  Consider correction of her thrombocytopenia if bleeding persists. ? ?Catherine Burke ?November 19, 2021, 11:39 AM ? ? ?

## 2021-12-03 NOTE — Progress Notes (Signed)
NAME:  Catherine Burke, MRN:  [id number], DOB:  [birth year], LOS: 67 ADMISSION DATE:  10/10/2021   History of Present Illness:  Initially admitted for angioedema 16 days ago Developed tongue necrosis as seen by ENT  Has ESRD on HD Transferred top ICU for profuse bleeding from her tongue with severe resp distress and aspiration of blood   Significant Hospital Events: Including procedures, antibiotic start and stop dates in addition to other pertinent events   2/12 admitted for angioedema 2/14, Patient had worsening of her facial and tongue swelling.  Pt transferred to [facility] and PCCM consulted on 2/14 for close monitoring.  Pt was transferred back to the floor after tongue swelling improved.  Hospitalization prolonged due to slow progress back to oral intake and now tongue bleeding. 2/14 ENT consulted 2/23: Patient still not doing well with p.o. intake or oral medications.  Discussed the possibility of image guided NGT placement.    2/24 HD catheter placed  2/27: Patient became upset yesterday.  Wanted NG tube out.  Bedside RN and the Dr. Posey Pronto spoke to patient and family at bedside.  2/28: Lengthy conversation with patient at bedside this morning.  Explained that at this time she is demonstrating some signs of improvement however was unsafe to pull NG tube out unless she is able to eat and swallow reliably.  She expressed understanding.  She also clearly said no when asked about placement of a PEG tube.  Deteriorating platelet count.  Hematology evaluation requested today.  3/2 transferred to ICU for severe resp failure and aspiration of blood into airways 3/2 emergently intubated, ENT and VASC surgery notified          Interim History / Subjective:  Emergently intubated Critically ill Prognosis is poor Tongue is bleeding Severe resp distress from aspiration of blood      Objective   Blood pressure (!) 174/68, pulse 80, temperature 98 F (36.7 C), resp. rate 20, height 5'  4" (1.626 m), weight 57.1 kg, SpO2 98 %.        Intake/Output Summary (Last 24 hours) at 11/25/2021 0944 Last data filed at November 25, 2021 0600 Gross per 24 hour  Intake 496 ml  Output 1488 ml  Net -992 ml   Filed Weights   11/02/21 0912 11/02/21 0924 11/02/21 1227  Weight: 58.5 kg 58.5 kg 57.1 kg      REVIEW OF SYSTEMS  PATIENT IS UNABLE TO PROVIDE COMPLETE REVIEW OF SYSTEMS DUE TO SEVERE CRITICAL ILLNESS AND TOXIC METABOLIC ENCEPHALOPATHY  ALL OTHER ROS ARE NEGATIVE   PHYSICAL EXAMINATION:  GENERAL:critically ill appearing, +resp distress EYES: Pupils equal, round, reactive to light.  No scleral icterus.  MOUTH: bleeding tongue NECK: Supple.  PULMONARY: +rhonchi, +wheezing CARDIOVASCULAR: S1 and S2.  No murmurs  GASTROINTESTINAL: Soft, nontender, -distended. Positive bowel sounds.  MUSCULOSKELETAL: No swelling, clubbing, or edema.  NEUROLOGIC: obtunded SKIN:intact,warm,dry      ASSESSMENT AND PLAN SYNOPSIS  81 yo white female with complicated angioedema with necrosis of tongue with acute and severe hypoxic respiratory failure due to excessive and massive tongue bleeding and aspiration of blood into lungs   Severe ACUTE Hypoxic and Hypercapnic Respiratory Failure -continue Mechanical Ventilator support -continue Bronchodilator Therapy -Wean Fio2 and PEEP as tolerated -VAP/VENT bundle implementation -will NOT perform SAT/SBT when respiratory parameters are met     BLEEDING TONGUE AND COMPLICATION OF ANGIOEDEMA WITH TONGUE NECROSIS WILL PACK ORAL CAVITY WITH GAUZE ENT AND VASC SURGERY CONSULTED    CARDIAC ICU monitoring  ACUTE KIDNEY INJURY/Renal Failure -continue Foley Catheter-assess need -Avoid nephrotoxic agents -Follow urine output, BMP -Ensure adequate renal perfusion, optimize oxygenation -Renal dose medications HD as needed   Intake/Output Summary (Last 24 hours) at 11-05-21 0944 Last data filed at Nov 05, 2021 0600 Gross per 24 hour  Intake  496 ml  Output 1488 ml  Net -992 ml     NEUROLOGY need for sedation Goal RASS -2 to -3   ENDO - ICU hypoglycemic\Hyperglycemia protocol -check FSBS per protocol   GI GI PROPHYLAXIS as indicated  NUTRITIONAL STATUS DIET-->NPO Constipation protocol as indicated   ELECTROLYTES -follow labs as needed -replace as needed -pharmacy consultation and following     Best practice (right click and "Reselect all SmartList Selections" daily)  Diet:  NPO Pain/Anxiety/Delirium protocol (if indicated): Yes (RASS goal -2) VAP protocol (if indicated): Yes DVT prophylaxis: Contraindicated GI prophylaxis: PPI Mobility:  bed rest  Code Status:  LIMITED  Disposition: ICU  Labs   CBC: Recent Labs  Lab 10/30/21 0450 10/31/21 0358 11/01/21 0455 11/02/21 0553 2021/11/05 0515  WBC 15.1* 12.5* 10.4 7.9 5.0  HGB 6.7* 7.8* 8.1* 7.8* 7.0*  HCT 21.3* 24.1* 25.2* 24.8* 22.4*  MCV 88.8 88.0 89.0 90.8 91.4  PLT 127* 82* 69* 63* 52*    Basic Metabolic Panel: Recent Labs  Lab 11/01/2021 0509 10/19/2021 1108 10/14/2021 1649 10/29/21 0503 10/29/21 1642 10/31/21 1300 05-Nov-2021 0515  NA 133*  --   --  134*  --  131* 133*  K 3.3*  --   --  4.0  --  5.1 5.5*  CL 98  --   --  98  --  96* 97*  CO2 22  --   --  24  --  26 28  GLUCOSE 222*  --   --  221*  --  209* 231*  BUN 84*  --   --  78*  --  64* 42*  CREATININE 6.15*  --   --  5.71*  --  4.50* 2.75*  CALCIUM 7.3*  --   --  7.4*  --  7.7* 7.5*  MG 1.7 1.7 1.7 1.8 1.7  --   --   PHOS  --  6.3* 5.7* 5.7* 3.9 4.8*  --    GFR: Estimated Creatinine Clearance: 13.6 mL/min (A) (by C-G formula based on SCr of 2.75 mg/dL (H)). Recent Labs  Lab 10/31/21 0358 11/01/21 0455 11/02/21 0553 11/05/21 0515  WBC 12.5* 10.4 7.9 5.0    Liver Function Tests: Recent Labs  Lab 10/31/21 1300 11/05/2021 0515  AST  --  26  ALT  --  25  ALKPHOS  --  66  BILITOT  --  0.8  PROT  --  4.2*  ALBUMIN 1.8* 1.6*   No results for input(s): LIPASE, AMYLASE  in the last 168 hours. No results for input(s): AMMONIA in the last 168 hours.  ABG No results found for: PHART, PCO2ART, PO2ART, HCO3, TCO2, ACIDBASEDEF, O2SAT   Coagulation Profile: Recent Labs  Lab 10/31/21 0745  INR 1.0    Cardiac Enzymes: No results for input(s): CKTOTAL, CKMB, CKMBINDEX, TROPONINI in the last 168 hours.  HbA1C: Hgb A1C (fingerstick)  Date/Time Value Ref Range Status  02/24/2015 08:53 AM 13.0 (H) <5.7 % Final    Comment:  According to the ADA Clinical Practice Recommendations for 2011, when HbA1c is used as a screening test:     >=6.5%   Diagnostic of Diabetes Mellitus            (if abnormal result is confirmed)   5.7-6.4%   Increased risk of developing Diabetes Mellitus   References:Diagnosis and Classification of Diabetes Mellitus,Diabetes TMHD,6222,97(LGXQJ 1):S62-S69 and Standards of Medical Care in         Diabetes - 2011,Diabetes JHER,7408,14 (Suppl 1):S11-S61.      Hgb A1c MFr Bld  Date/Time Value Ref Range Status  10/17/2021 05:03 AM 5.7 (H) 4.8 - 5.6 % Final    Comment:    (NOTE) Pre diabetes:          5.7%-6.4%  Diabetes:              >6.4%  Glycemic control for   <7.0% adults with diabetes   07/20/2021 03:19 PM 6.1 (H) 4.8 - 5.6 % Final    Comment:    (NOTE) Pre diabetes:          5.7%-6.4%  Diabetes:              >6.4%  Glycemic control for   <7.0% adults with diabetes     CBG: Recent Labs  Lab 11/02/21 1603 11/02/21 2054 11/02/21 2357 11/22/21 0547 2021/11/22 0800  GLUCAP 213* 255* 253* 227* 220*    Allergies Allergies  Allergen Reactions   Levofloxacin Other (See Comments)    Throat Swelling    Lisinopril Swelling   Penicillins Anaphylaxis   Telmisartan Swelling    Tongue swelling   Other    Penicillin G Other (See Comments)   Sulfa Antibiotics        DVT/GI PRX  assessed I Assessed the need for Labs I Assessed the need  for Foley I Assessed the need for Central Venous Line Family Discussion when available I Assessed the need for Mobilization I made an Assessment of medications to be adjusted accordingly Safety Risk assessment completed  CASE DISCUSSED IN MULTIDISCIPLINARY ROUNDS WITH ICU TEAM     Critical Care Time devoted to patient care services described in this note is 75 minutes.  Critical care was necessary to treat or prevent imminent or life-threatening deterioration.   PATIENT WITH VERY POOR PROGNOSIS I ANTICIPATE PROLONGED ICU LOS  Patient with Multiorgan failure and at high risk for cardiac arrest and death.    Corrin Parker, M.D.  [facility]  Medical Director [place] Director [facility] Cardio-Pulmonary Department

## 2021-12-03 NOTE — Progress Notes (Signed)
PT Cancellation Note ? ?Patient Details ?Name: Catherine Burke ?MRN: [id number] ?DOB: 1941/04/13 ? ? ?Cancelled Treatment:     Per discussion with MD. Pt is not appropriate for PT at this time. We will continue to follow closely and return if/when pt is more appropriate.  ? ? ?Willette Pa ?11/22/21, 9:05 AM ?

## 2021-12-03 NOTE — Progress Notes (Signed)
GOALS OF CARE DISCUSSION ? ?The Clinical status was relayed to family in detail- ?All family members at bedside ? ?Updated and notified of patients medical condition- ?Patient remains unresponsive and will not open eyes to command.   ?Patient is having a weak cough and struggling to remove secretions.   ?Patient with increased WOB and using accessory muscles to breathe ?Explained to family course of therapy and the modalities  ? ? ?Patient with Progressive multiorgan failure with a very high probablity of a very minimal chance of meaningful recovery despite all aggressive and optimal medical therapy.  ? ?Family understands the situation. ? ?They have consented and agreed to DNR/DNI and would like to proceed with Comfort care measures. ? ?Plan to stop VENT but keep ETT in place ? ?Family are satisfied with Plan of action and management. All questions answered ? ?Additional CC time 35 mins ? ? ?Corrin Parker, M.D.  ?[facility]  ?Medical Director [place] ?Medical Director [facility] Cardio-Pulmonary Department  ? ? ?

## 2021-12-03 NOTE — Progress Notes (Signed)
GOALS OF CARE DISCUSSION ? ?The Clinical status was relayed to family in detail- ?Son and Daughter and grandchildren ? ?Updated and notified of patients medical condition- ?Patient remains unresponsive and will not open eyes to command.   ?Patient is having a weak cough and struggling to remove secretions.   ?Patient with increased WOB and using accessory muscles to breathe ?Explained to family course of therapy and the modalities  ? ? ?Patient with Progressive multiorgan failure with a very high probablity of a very minimal chance of meaningful recovery despite all aggressive and optimal medical therapy.  ? ?Family understands the situation. ? ?They have consented and agreed to DNR/DNI  status and if possible would like to patient patient home with hospice if this is possible. Patient has suffered enough over last 2 weeks, patient would NOT want PEG tube either ?Will await final assessment with ENT/vasc surgery ? ?Family are satisfied with Plan of action and management. All questions answered ? ?Additional CC time 35 mins ? ? ?Corrin Parker, M.D.  ?[facility]  ?Medical Director [place] ?Medical Director [facility] Cardio-Pulmonary Department  ? ? ?

## 2021-12-03 NOTE — Procedures (Signed)
Endotracheal Intubation: ?Patient required placement of an artificial airway secondary to Respiratory Failure ? ?Consent: Emergent.  ? ?Hand washing performed prior to starting the procedure.  ? ?Medications administered for sedation prior to procedure:  ?Midazolam 4 mg IV,  Vecuronium 20 mg IV, Fentanyl 100 mcg IV.  ? ? ?A time out procedure was called and correct patient, name, & ID confirmed. Needed supplies and equipment were assembled and checked to include ETT, 10 ml syringe, Glidescope, Mac and Miller blades, suction, oxygen and bag mask valve, end tidal CO2 monitor.  ? ?Patient was positioned to align the mouth and pharynx to facilitate visualization of the glottis.  ? ?Heart rate, SpO2 and blood pressure was continuously monitored during the procedure. Pre-oxygenation was conducted prior to intubation and endotracheal tube was placed through the vocal cords into the trachea.  ? ? ? The artificial airway was placed under direct visualization via glidescope route using a 7.5  ETT on the first attempt. ? ?ETT was secured at 23 cm mark. ? ?Placement was confirmed by auscuitation of lungs with good breath sounds bilaterally and no stomach sounds.  Condensation was noted on endotracheal tube.   ?Pulse ox 98%.  CO2 detector in place with appropriate color change.  ? ?Complications: None .  ? ?Operator: Aliece Honold.  ? ?Chest radiograph ordered and pending.  ? ? ?Corrin Parker, M.D.  ?[facility]  ?Medical Director [place] ?Medical Director [facility] Cardio-Pulmonary Department  ? ? ? ? ?

## 2021-12-03 NOTE — Progress Notes (Incomplete)
PROGRESS NOTE  Catherine Burke    DOB: 1941/08/20, 81 y.o.  NUU:[id number]    Code Status: Partial Code   DOA: 10/06/2021   LOS: 80   Brief hospital course  Catherine Burke is a 81 y.o. female with a PMH significant for ESRD on peritoneal dialysis, HTN, HLD, type II DM. They presented from home to the ED on 10/19/2021 with tongue and facial swelling x 1 days.  She did not have any recent medication changes. In the ED, it was found that they had angioedema.  They were treated with IV steroids, Benadryl 25 mg x 1, Pepcid 20 mg IV x1, EpiPen x1 then started on IV decadron 6 mg q6h.  Patient was admitted to medicine service for further workup and management of angioedema as outlined in detail below. Began having tongue bleeding as sequelae from swelling.  ENT evaluated and recommended packing. Patient placed on NG tube for feeding until able to tolerate p.o. diet.  2021/11/27 -stable, improved  Assessment & Plan  Principal Problem:   Angioedema, initial encounter Active Problems:   Hyperlipidemia   Essential hypertension   GERD (gastroesophageal reflux disease)   Chronic kidney disease (CKD), stage IV (severe) (HCC)   ESRD (end stage renal disease) on dialysis (HCC)   Facial cellulitis   Malnutrition of moderate degree   Angioedema   Thrombocytopenia (HCC)   Anemia in ESRD (end-stage renal disease) (HCC)   Atrial fibrillation, new onset ([place])  Angioedema-thought to be due to home telmisartan s/p -Solu-Medrol 125 mg x 1, Benadryl 25 mg x 1, Pepcid 20 mg IV x1, EpiPen x1 then started on IV decadron 6 mg q6h.  After tongue swelling improved, pt was started on steroid taper. -Difficulty tolerating oral Decadron -NGT placed 2/23 Plan: Continue Decadron, dose decreased to 4 mg IV daily ARB/ACE inhibitors added to allergy list SLP reevaluated   Tongue bleeding --became more profuse early morning on 2/20.  From sloughing and possible trauma following angioedema. --ENT performed bedside  debridement and suctioning  --Exam slowly improving Plan: Peridex mouth rinses NGT in place.  Patient on tube feeds Appreciate follow-up from ENT Outpatient follow-up 1 week postdischarge   Thrombocytopenia Unclear etiology but likely reactive to multifactorial illnesses.  182>127>82>69>63 Heme/onc evaluated 2/28 and recommended continued monitoring -CBC a.m.   Dysphagia- NG tube placed 2/23. - SLP following, appreciate recs   Essential hypertension- hypotensive while in dialysis --home ARB telmisartan d/c'ed - holding antihypertensives - continue to monitor   ESRD on PD- now on HD. 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  --nonemergent contrast enhanced brain MRI for further evaluation as outpatient  Body mass index is 21.61 kg/m.  VTE ppx: Place TED hose Start: 10/23/2021 1846   Diet:     Diet   DIET - DYS 1 Room service appropriate? Yes with Assist; Fluid consistency: Nectar Thick   Subjective 27-Nov-2021    ***   Objective   Vitals:   11/02/21 1434 11/02/21 1604 11/02/21 2010 November 27, 2021 0452  BP: (!) 109/42 (!) 126/45 (!) 148/63 (!) 168/69  Pulse: (!) 102 (!) 101 85 81  Resp: 17 20 18 18   Temp:  99 F (37.2 C) 98 F (36.7 C) 98 F (36.7 C)  TempSrc:  Oral    SpO2:  97% 97% 100%  Weight:      Height:        Intake/Output Summary (Last 24 hours) at 11/26/2021 0720 Last data filed at 11-26-21 0600 Gross per 24 hour  Intake 496 ml  Output 1488 ml  Net  -992 ml    Filed Weights   11/02/21 0912 11/02/21 0924 11/02/21 1227  Weight: 58.5 kg 58.5 kg 57.1 kg     Physical Exam:  General: awake, alert, NAD HEENT: atraumatic, clear conjunctiva, anicteric sclera, MMM, hearing grossly normal. NG tube in place Respiratory: normal respiratory effort. Cardiovascular: quick capillary refill  Gastrointestinal: soft, NT, ND Nervous: A&O x3. no gross focal neurologic deficits, normal speech Extremities: moves all equally, positive non-pitting edema, decreased tone Skin: dry, intact, normal temperature, normal color. Ecchymosis throughout arms Psychiatry: flat mood, congruent affect  Labs   I have personally reviewed the following labs and imaging studies CBC    Component Value Date/Time   WBC 5.0 11-26-21 0515   RBC 2.45 (L) 11-26-21 0515   HGB 7.0 (L) 11-26-2021 0515   HCT 22.4 (L) 11/26/2021 0515   PLT 52 (L) November 26, 2021 0515   MCV 91.4 26-Nov-2021 0515   MCH 28.6 2021-11-26 0515   MCHC 31.3 11-26-2021 0515   RDW 17.4 (H) 11-26-2021 0515   LYMPHSABS 0.2 (L) 10/17/2021 0503   MONOABS 0.2 10/17/2021 0503   EOSABS 0.0 10/17/2021 0503   BASOSABS 0.0 10/17/2021 0503   BMP Latest Ref Rng & Units 11/26/2021 10/31/2021 10/29/2021  Glucose 70 - 99 mg/dL 231(H) 209(H) 221(H)  BUN 8 - 23 mg/dL 42(H) 64(H) 78(H)  Creatinine 0.44 - 1.00 mg/dL 2.75(H) 4.50(H) 5.71(H)  Sodium 135 - 145 mmol/L 133(L) 131(L) 134(L)  Potassium 3.5 - 5.1 mmol/L 5.5(H) 5.1 4.0  Chloride 98 - 111 mmol/L 97(L) 96(L) 98  CO2 22 - 32 mmol/L 28 26 24   Calcium 8.9 - 10.3 mg/dL 7.5(L) 7.7(L) 7.4(L)    No results found.  Disposition Plan & Communication  Patient status: Inpatient  Admitted From: Home Planned disposition location: Skilled nursing facility Anticipated discharge date: TBD pending improvement in PO intake  Family Communication: none    Author: Richarda Osmond, DO [facility] 11-26-21, 7:20 AM   Available by Epic secure chat 7AM-7PM. If  7PM-7AM, please contact night-coverage.  [facility] contact information found on [email or web address].

## 2021-12-03 NOTE — Progress Notes (Signed)
?   11-05-21 2000  ?Clinical Encounter Type  ?Visited With Family  ?Visit Type Initial;Death  ?Referral From Nurse  ?Consult/Referral To Chaplain  ?Spiritual Encounters  ?Spiritual Needs Grief support;Prayer  ? ?Chaplain Burris responded to notice of Pt's passing. Chaplain B offered compassionate presence and grief support to family. Chaplain B invited memories and sharing about Catherine Burke's life. Chaplain offered a prayer at family's request and urged family to remain gathered at the bedside as long as needed to be present together and to process their loss. ? ?Chaplain B is on-call tonight and remains available for ongoing support as needed. ?

## 2021-12-03 NOTE — Progress Notes (Signed)
Called to patient's room by hygiene and mobility tech. Patient noted to be bleeding profusely from mouth and coughing up bright red blood with clots. MD currently rounding on floor and notified to come to bedside.  ?

## 2021-12-03 NOTE — Progress Notes (Signed)
OT Cancellation Note ? ?Patient Details ?Name: Catherine Burke ?MRN: [id number] ?DOB: 03-11-1941 ? ? ?Cancelled Treatment:    Reason Eval/Treat Not Completed: Medical issues which prohibited therapy. Pt transferred to ICU secondary to profuse bleeding from tongue with severe respiratory distress and aspiration of blood. OT to sign off at this time with pt having change in status and needing higher level of care. Please re-consult when appropriate.   ? ? ? ?Darleen Crocker, MS, OTR/L , CBIS ?ascom [phone]  ?Nov 12, 2021, 10:20 AM  ?

## 2021-12-03 NOTE — TOC Progression Note (Signed)
Transition of Care (TOC) - Progression Note  ? ? ?Patient Details  ?Name: Catherine Burke ?MRN: [id number] ?Date of Birth: 10-04-1940 ? ?Transition of Care (TOC) CM/SW Contact  ?Shelbie Hutching, RN ?Phone Number: ?11/11/21, 10:46 AM ? ?Clinical Narrative:    ?Patient required transfer to ICU and emergent intubation.  Very large blood clot removed from the back of patient's throat. ? ? ?TOC will follow.  ? ? ?Expected Discharge Plan: [place] ?Barriers to Discharge: Continued Medical Work up ? ?Expected Discharge Plan and Services ?Expected Discharge Plan: [place] ?  ?Discharge Planning Services: CM Consult ?  ?Living arrangements for the past 2 months: [place] ?                ?DME Arranged: N/A ?DME Agency: NA ?  ?  ?  ?  ?  ?  ?  ?  ? ? ?Social Determinants of Health (SDOH) Interventions ?  ? ?Readmission Risk Interventions ?Readmission Risk Prevention Plan 10/19/2021  ?Transportation Screening Complete  ?Medication Review Press photographer) Complete  ?PCP or Specialist appointment within 3-5 days of discharge Complete  ?[place] or Home Care Consult Complete  ?SW Recovery Care/Counseling Consult Complete  ?Palliative Care Screening Not Applicable  ?Skilled Nursing Facility Complete  ?Some recent data might be hidden  ? ? ?

## 2021-12-03 NOTE — Progress Notes (Signed)
[facility]  ROUNDING NOTE   Subjective:   [name] was admitted to [facility] on 10/18/2021 for Facial swelling [R22.0] Angioedema, initial encounter [T78.3XXA] Fever, unspecified fever cause [R50.9] Angioedema [T78.3XXA]  Patient is known to our clinic and receives PD management at [facility], supervised by Dr Juleen China.   Patient seen after being transferred to ICU.  Per chart review, nurse was called to patient's room and witnessed profuse bleeding from mouth.  Patient also reported to have been coughing up bright red blood clots during the same event.  Attempts to control bleeding failed resulting in patient transferring to ICU with intubation.   Objective:  Vital signs in last 24 hours:  Temp:  [98 F (36.7 C)-99 F (37.2 C)] 98.3 F (36.8 C) (03/02 1000) Pulse Rate:  [72-115] 72 (03/02 1345) Resp:  [15-20] 16 (03/02 1345) BP: (55-174)/(39-70) 75/47 (03/02 1345) SpO2:  [84 %-100 %] 100 % (03/02 1345) FiO2 (%):  [40 %-100 %] 40 % (03/02 1213)  Weight change: -2.2 kg Filed Weights   11/02/21 0912 11/02/21 0924 11/02/21 1227  Weight: 58.5 kg 58.5 kg 57.1 kg    Intake/Output: I/O last 3 completed shifts: In: 1128.5 [NG/GT:1128.5] Out: 1488 [Other:1488]   Intake/Output this shift:  Total I/O In: 1.3 [I.V.:1.3] Out: -   Physical Exam: General: NAD  Head: dry oral mucosa, left lateral tongue ulcers  Eyes: Anicteric  Lungs:  Intubated with vent  Heart: Regular rate and rhythm  Abdomen:  Soft, nontender  Extremities:  2+ Dependent peripheral edema.  Neurologic: Alert and oriented  Skin: No lesions, generalized ecchymosis   Access: PD catheter, Rt IJ Permcath placed on 10/24/2021 by Dr Lucky Cowboy    Basic Metabolic Panel: Recent Labs  Lab 10/09/2021 0509 10/05/2021 1108 10/21/2021 1649 10/29/21 0503 10/29/21 1642 10/31/21 1300 11/08/21 0515  NA 133*  --   --  134*  --  131* 133*  K 3.3*  --   --  4.0  --  5.1 5.5*  CL 98  --   --  98  --  96* 97*   CO2 22  --   --  24  --  26 28  GLUCOSE 222*  --   --  221*  --  209* 231*  BUN 84*  --   --  78*  --  64* 42*  CREATININE 6.15*  --   --  5.71*  --  4.50* 2.75*  CALCIUM 7.3*  --   --  7.4*  --  7.7* 7.5*  MG 1.7 1.7 1.7 1.8 1.7  --   --   PHOS  --  6.3* 5.7* 5.7* 3.9 4.8*  --      Liver Function Tests: Recent Labs  Lab 10/31/21 1300 November 08, 2021 0515  AST  --  26  ALT  --  25  ALKPHOS  --  66  BILITOT  --  0.8  PROT  --  4.2*  ALBUMIN 1.8* 1.6*     No results for input(s): LIPASE, AMYLASE in the last 168 hours. No results for input(s): AMMONIA in the last 168 hours.  CBC: Recent Labs  Lab 10/30/21 0450 10/31/21 0358 11/01/21 0455 11/02/21 0553 2021-11-08 0515  WBC 15.1* 12.5* 10.4 7.9 5.0  HGB 6.7* 7.8* 8.1* 7.8* 7.0*  HCT 21.3* 24.1* 25.2* 24.8* 22.4*  MCV 88.8 88.0 89.0 90.8 91.4  PLT 127* 82* 69* 63* 52*     Cardiac Enzymes: No results for input(s): CKTOTAL, CKMB, CKMBINDEX,  TROPONINI in the last 168 hours.  BNP: Invalid input(s): POCBNP  CBG: Recent Labs  Lab 11/02/21 2357 Nov 21, 2021 0547 21-Nov-2021 0800 11-21-21 0947 11-21-21 1253  GLUCAP 253* 227* 220* 221* 163*     Microbiology: Results for orders placed or performed during the hospital encounter of 10/14/2021  Resp Panel by RT-PCR (Flu A&B, Covid) Nasopharyngeal Swab     Status: None   Collection Time: 10/15/2021  5:18 PM   Specimen: Nasopharyngeal Swab; Nasopharyngeal(NP) swabs in vial transport medium  Result Value Ref Range Status   SARS Coronavirus 2 by RT PCR NEGATIVE NEGATIVE Final    Comment: (NOTE) SARS-CoV-2 target nucleic acids are NOT DETECTED.  The SARS-CoV-2 RNA is generally detectable in upper respiratory specimens during the acute phase of infection. The lowest concentration of SARS-CoV-2 viral copies this assay can detect is 138 copies/mL. A negative result does not preclude SARS-Cov-2 infection and should not be used as the sole basis for treatment or other patient management  decisions. A negative result may occur with  improper specimen collection/handling, submission of specimen other than nasopharyngeal swab, presence of viral mutation(s) within the areas targeted by this assay, and inadequate number of viral copies(<138 copies/mL). A negative result must be combined with clinical observations, patient history, and epidemiological information. The expected result is Negative.  Fact Sheet for Patients:  EntrepreneurPulse.com.au  Fact Sheet for Healthcare Providers:  IncredibleEmployment.be  This test is no t yet approved or cleared by the [place] FDA and  has been authorized for detection and/or diagnosis of SARS-CoV-2 by FDA under an Emergency Use Authorization (EUA). This EUA will remain  in effect (meaning this test can be used) for the duration of the COVID-19 declaration under Section 564(b)(1) of the Act, 21 U.S.C.section 360bbb-3(b)(1), unless the authorization is terminated  or revoked sooner.       Influenza A by PCR NEGATIVE NEGATIVE Final   Influenza B by PCR NEGATIVE NEGATIVE Final    Comment: (NOTE) The Xpert Xpress SARS-CoV-2/FLU/RSV plus assay is intended as an aid in the diagnosis of influenza from Nasopharyngeal swab specimens and should not be used as a sole basis for treatment. Nasal washings and aspirates are unacceptable for Xpert Xpress SARS-CoV-2/FLU/RSV testing.  Fact Sheet for Patients: EntrepreneurPulse.com.au  Fact Sheet for Healthcare Providers: IncredibleEmployment.be  This test is not yet approved or cleared by the [place] FDA and has been authorized for detection and/or diagnosis of SARS-CoV-2 by FDA under an Emergency Use Authorization (EUA). This EUA will remain in effect (meaning this test can be used) for the duration of the COVID-19 declaration under Section 564(b)(1) of the Act, 21 U.S.C. section 360bbb-3(b)(1), unless the  authorization is terminated or revoked.  Performed at [facility], [address] [place], [place] [address]   Body fluid culture w Gram Stain     Status: None   Collection Time: 10/14/2021 11:40 PM   Specimen: Peritoneal Washings; Body Fluid  Result Value Ref Range Status   Specimen Description   Final    PERITONEAL DIALYSIS Performed at [facility], [address] [place], [place] [address]    Special Requests   Final    NONE Performed at [facility], [address] [place], [place] [address]    Gram Stain   Final    NO SQUAMOUS EPITHELIAL CELLS SEEN NO WBC SEEN NO ORGANISMS SEEN    Culture   Final    NO GROWTH 3 DAYS Performed at [facility],  [address] [place], [place] [address]    Report Status 10/20/2021 FINAL  Final  MRSA Next Gen by PCR, Nasal     Status: None   Collection Time: 10/18/21  1:40 PM   Specimen: Nasal Mucosa; Nasal Swab  Result Value Ref Range Status   MRSA by PCR Next Gen NOT DETECTED NOT DETECTED Final    Comment: (NOTE) The GeneXpert MRSA Assay (FDA approved for NASAL specimens only), is one component of a comprehensive MRSA colonization surveillance program. It is not intended to diagnose MRSA infection nor to guide or monitor treatment for MRSA infections. Test performance is not FDA approved in patients less than 4 years old. Performed at [facility], [address] [place], [place] [address]     Coagulation Studies: No results for input(s): LABPROT, INR in the last 72 hours.    Urinalysis: No results for input(s): COLORURINE, LABSPEC, PHURINE, GLUCOSEU, HGBUR, BILIRUBINUR, KETONESUR, PROTEINUR, UROBILINOGEN, NITRITE, LEUKOCYTESUR in the last 72 hours.  Invalid input(s): APPERANCEUR    Imaging: No results found.   Medications:    dextrose     dialysis solution 1.5% low-MG/low-CA Stopped (10/10/2021 1035)   fentaNYL infusion INTRAVENOUS 300 mcg/hr (11/16/2021 1331)    norepinephrine Stopped (11-16-2021 1041)    sodium chloride   Intravenous Once   chlorhexidine  15 mL Mouth/Throat QID   Chlorhexidine Gluconate Cloth  6 each Topical Daily   insulin aspart  0-15 Units Subcutaneous Q4H   magic mouthwash w/lidocaine  10 mL Oral QID   acetaminophen **OR** acetaminophen, benzocaine, glycopyrrolate **OR** glycopyrrolate **OR** glycopyrrolate, midazolam, midazolam, midazolam, morphine injection  Assessment/ Plan:  [name] is a 81 y.o. white female with end stage renal disease on peritoneal dialysis, diabetes mellitus type II insulin dependent, hypertension, and hyperlipidemia who is admitted to [facility] on 10/31/2021 for Facial swelling [R22.0] Angioedema, initial encounter [T78.3XXA] Fever, unspecified fever cause [R50.9] Angioedema [T78.3XXA]  [facility] 59kg CCPD 5 exchanges 2 liter fills 9 hours  End Stage Renal Disease:  Patient received dialysis yesterday UF goal 1.4 L achieved.  Patient experienced hypotension throughout treatment.  PD catheter flushed after treatment yesterday.   03/01 0701 - 03/02 0700 In: 496 [NG/GT:496] Out: 1488    Hypertension:   ACE inhibitor and ARB initially held due to angioedema.  Patient currently receiving clonidine, hydralazine,  metoprolol and Isosorbide.   Anemia of chronic kidney disease:  Mircera as outpatient given 09/27/2021.    Lab Results  Component Value Date   HGB 7.0 (L) 11/16/21  Aranesp ordered weekly (Thursday) Hemoglobin remains below target, suspect low volume ongoing blood loss from unknown source.  4.  Angioedema-believed due to prescribed ACE-I.   Transferred our of ICU on 10/20/21. ENT following   Continues receiving dexamethasone daily  Patient transferred to ICU for uncontrolled bleeding.  Patient intubated to protect airway.ENT at bedside to manage bleeding source.  Family has decided to proceed with comfort measures at this time.  We will sign  off.     LOS: [place] Mar 15, 20231:58 PM

## 2021-12-03 NOTE — Progress Notes (Signed)
SLP Cancellation Note ? ?Patient Details ?Name: Catherine Burke ?MRN: [id number] ?DOB: 27-Apr-1941 ? ? ?Cancelled treatment:       Reason Eval/Treat Not Completed: Medical issues which prohibited therapy;Patient not medically ready (chart reviewed) ?Per chart notes, pt was transferred top ICU for profuse bleeding from her tongue with severe resp distress and aspiration of the blood earlier this morning. She is now orally intubated on vent support. Family is meeting w/ MDs re: POC moving forward. Noted ENT consult note from today. ?ST services will sign off at this time. MD to reconsult when pt is medically appropriate. ? ? ? ? ?Orinda Kenner, MS, CCC-SLP ?Speech Language Pathologist ?Rehab Services; [place] ?[phone] (ascom) ?Catherine Burke ?11/23/21, 1:19 PM ?

## 2021-12-03 NOTE — Progress Notes (Signed)
Per Dr. Zoila Shutter verbal order, the pt was disconnected from the vent but the ETT was left to keep the airway patent from blood clots. ?

## 2021-12-03 NOTE — Progress Notes (Signed)
PT Cancellation Note ? ?Patient Details ?Name: Catherine Burke ?MRN: [id number] ?DOB: 1941/07/23 ? ? ?Cancelled Treatment:    Reason Eval/Treat Not Completed: Medical issues which prohibited therapy (Per chat review, patient transferred to CCU due to profuse bleeding from tongue/oral cavity with associated respiratory distress. Per recent notes, patient/family now pursuing comfort measures.  Will complete initial order; please re-consult should goals/needs change) ? ? ?[name] H. Owens Shark, PT, DPT, NCS ?11-10-21, 3:57 PM ?[phone] ? ?

## 2021-12-03 DEATH — deceased

## 2022-05-03 IMAGING — DX DG CHEST 1V PORT
1 series · 1 of 1 positions shown · non-contrast
Comparison: 10/19/2021

CLINICAL DATA: Endotracheal tube placement. Respiratory failure.
Central line placement

EXAM:
PORTABLE CHEST 1 VIEW

[chest ap]
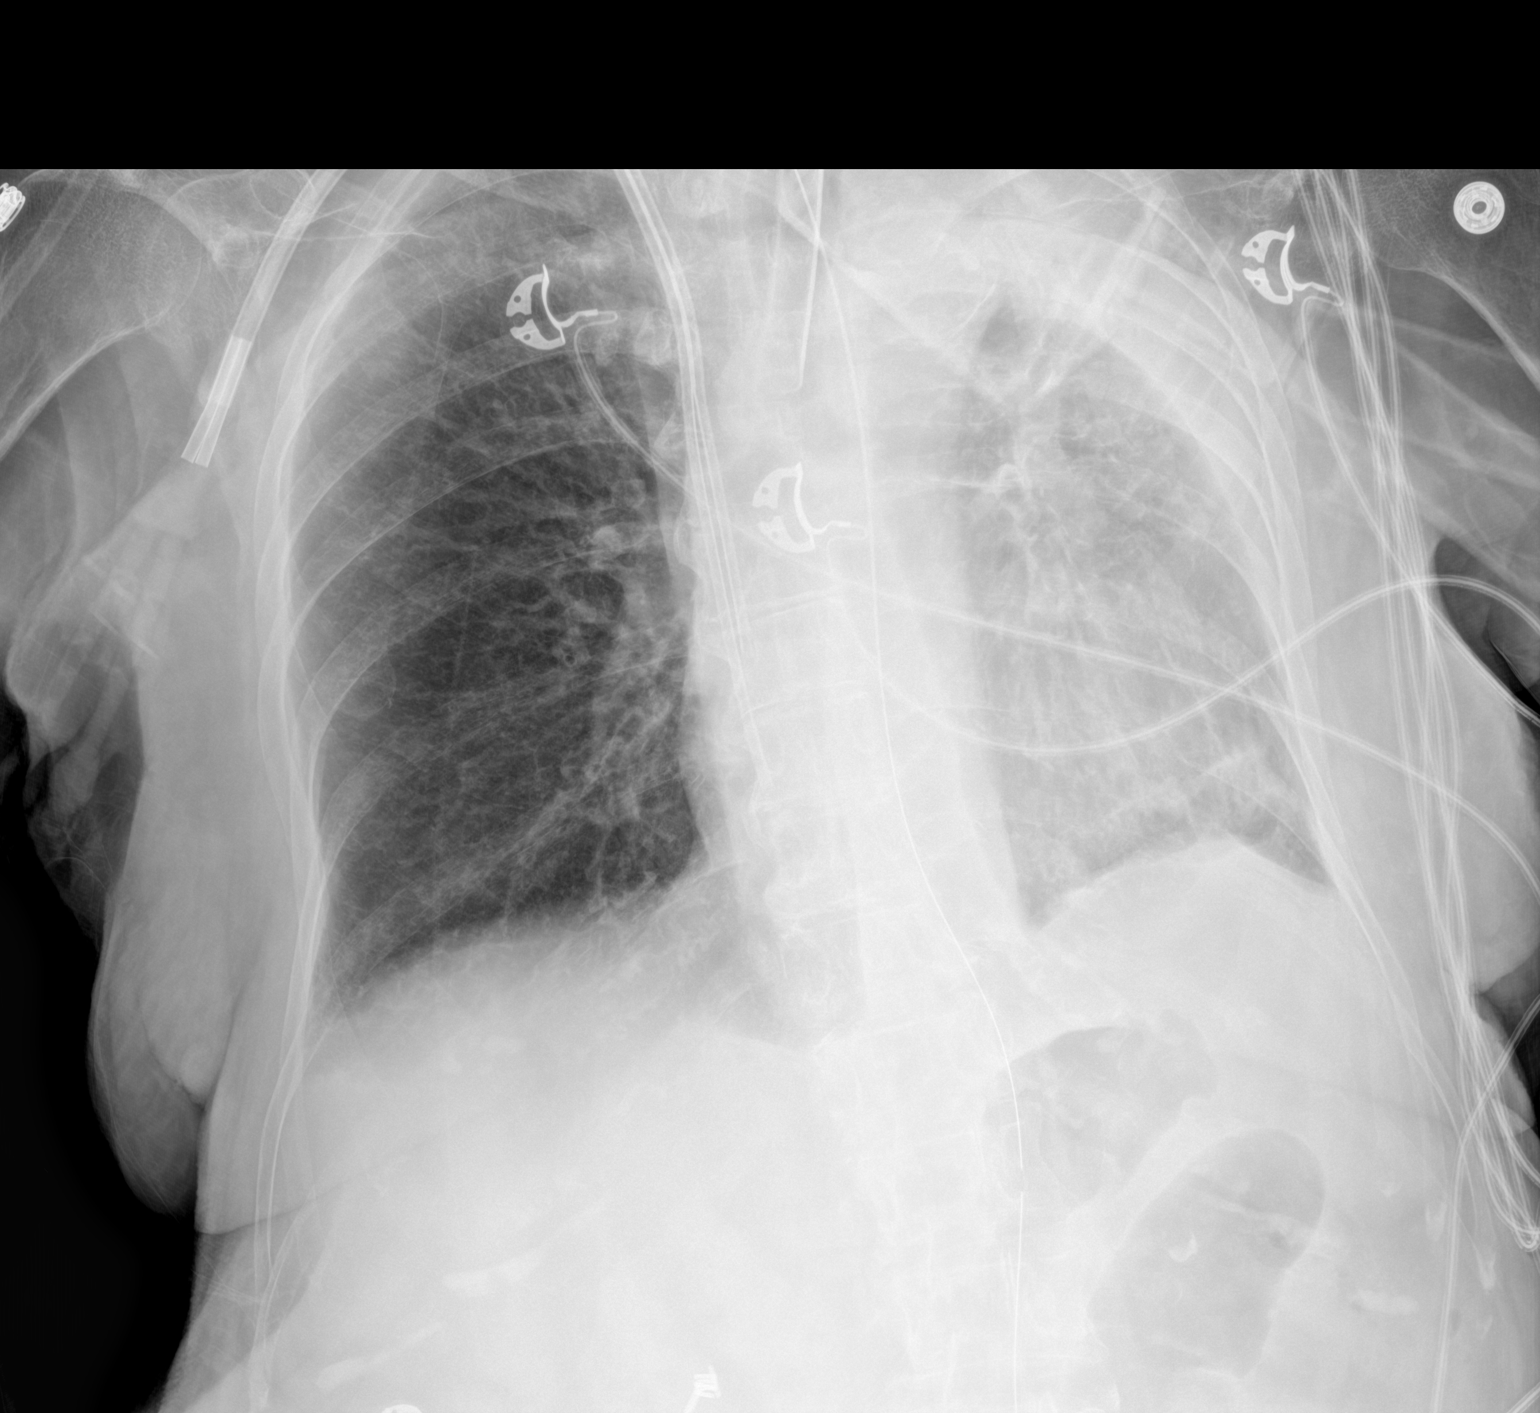

[1 of 1 positions shown; findings below may reference images not displayed]

FINDINGS: Endotracheal tube in the distal trachea approximally 1 cm above the
carina

Right jugular dual lumen central venous catheter tip in the lower
SVC/RA junction. No pneumothorax. NG tube in the stomach with the
side hole in the stomach.

Interval development of left upper lobe airspace disease likely
collapse. Associated effusion is present in the left apex. This was
not present previously. Right lung remains clear. Normal vascularity
IMPRESSION: Endotracheal tube 1 cm above the carina

Central line at the cavoatrial junction on the right. No
pneumothorax

Interval development of left upper lobe collapse and left apical
pleural effusion.
